# Patient Record
Sex: Male | Born: 1940
Health system: Southern US, Community
[De-identification: ages and names within clinical notes are randomized; demographics above are authoritative.]

## PROBLEM LIST (undated history)

## (undated) DIAGNOSIS — I471 Supraventricular tachycardia, unspecified: Secondary | ICD-10-CM

## (undated) DIAGNOSIS — N183 Chronic kidney disease, stage 3 unspecified: Secondary | ICD-10-CM

## (undated) DIAGNOSIS — F419 Anxiety disorder, unspecified: Secondary | ICD-10-CM

## (undated) DIAGNOSIS — I493 Ventricular premature depolarization: Secondary | ICD-10-CM

## (undated) DIAGNOSIS — K429 Umbilical hernia without obstruction or gangrene: Secondary | ICD-10-CM

## (undated) DIAGNOSIS — I259 Chronic ischemic heart disease, unspecified: Secondary | ICD-10-CM

## (undated) DIAGNOSIS — E669 Obesity, unspecified: Secondary | ICD-10-CM

## (undated) DIAGNOSIS — I2699 Other pulmonary embolism without acute cor pulmonale: Secondary | ICD-10-CM

## (undated) DIAGNOSIS — I10 Essential (primary) hypertension: Secondary | ICD-10-CM

## (undated) DIAGNOSIS — I456 Pre-excitation syndrome: Secondary | ICD-10-CM

## (undated) DIAGNOSIS — E785 Hyperlipidemia, unspecified: Secondary | ICD-10-CM

## (undated) DIAGNOSIS — I5032 Chronic diastolic (congestive) heart failure: Secondary | ICD-10-CM

## (undated) DIAGNOSIS — J449 Chronic obstructive pulmonary disease, unspecified: Secondary | ICD-10-CM

## (undated) DIAGNOSIS — G4733 Obstructive sleep apnea (adult) (pediatric): Secondary | ICD-10-CM

## (undated) DIAGNOSIS — R002 Palpitations: Secondary | ICD-10-CM

## (undated) DIAGNOSIS — J189 Pneumonia, unspecified organism: Secondary | ICD-10-CM

## (undated) HISTORY — DX: Palpitations: R00.2

## (undated) HISTORY — DX: Chronic ischemic heart disease, unspecified: I25.9

## (undated) HISTORY — DX: Obstructive sleep apnea (adult) (pediatric): G47.33

## (undated) HISTORY — DX: Chronic obstructive pulmonary disease, unspecified: J44.9

## (undated) HISTORY — DX: Umbilical hernia without obstruction or gangrene: K42.9

## (undated) HISTORY — DX: Pre-excitation syndrome: I45.6

## (undated) HISTORY — DX: Hyperlipidemia, unspecified: E78.5

## (undated) HISTORY — PX: HERNIA REPAIR: SHX51

## (undated) HISTORY — DX: Ventricular premature depolarization: I49.3

## (undated) HISTORY — DX: Supraventricular tachycardia, unspecified: I47.10

## (undated) HISTORY — DX: Obesity, unspecified: E66.9

## (undated) HISTORY — DX: Anxiety disorder, unspecified: F41.9

## (undated) HISTORY — DX: Supraventricular tachycardia: I47.1

## (undated) HISTORY — DX: Other pulmonary embolism without acute cor pulmonale: I26.99

## (undated) HISTORY — DX: Essential (primary) hypertension: I10

---

## 2004-12-16 ENCOUNTER — Ambulatory Visit: Payer: Self-pay | Admitting: Internal Medicine

## 2006-01-07 ENCOUNTER — Ambulatory Visit: Payer: Self-pay | Admitting: Internal Medicine

## 2006-04-16 ENCOUNTER — Encounter (INDEPENDENT_AMBULATORY_CARE_PROVIDER_SITE_OTHER): Payer: Self-pay | Admitting: Specialist

## 2006-04-16 ENCOUNTER — Ambulatory Visit (HOSPITAL_COMMUNITY): Admission: RE | Admit: 2006-04-16 | Discharge: 2006-04-17 | Payer: Self-pay | Admitting: Obstetrics and Gynecology

## 2006-09-01 ENCOUNTER — Ambulatory Visit: Payer: Self-pay | Admitting: Internal Medicine

## 2006-12-15 ENCOUNTER — Ambulatory Visit: Payer: Self-pay | Admitting: Internal Medicine

## 2007-05-24 ENCOUNTER — Ambulatory Visit: Payer: Self-pay | Admitting: Internal Medicine

## 2007-08-05 ENCOUNTER — Ambulatory Visit: Payer: Self-pay | Admitting: Internal Medicine

## 2007-08-15 DIAGNOSIS — F411 Generalized anxiety disorder: Secondary | ICD-10-CM | POA: Insufficient documentation

## 2007-08-15 DIAGNOSIS — I4949 Other premature depolarization: Secondary | ICD-10-CM

## 2007-08-15 DIAGNOSIS — G4733 Obstructive sleep apnea (adult) (pediatric): Secondary | ICD-10-CM | POA: Insufficient documentation

## 2007-08-15 DIAGNOSIS — J449 Chronic obstructive pulmonary disease, unspecified: Secondary | ICD-10-CM

## 2007-08-15 DIAGNOSIS — I10 Essential (primary) hypertension: Secondary | ICD-10-CM

## 2007-08-15 DIAGNOSIS — J4489 Other specified chronic obstructive pulmonary disease: Secondary | ICD-10-CM | POA: Insufficient documentation

## 2007-08-15 DIAGNOSIS — I259 Chronic ischemic heart disease, unspecified: Secondary | ICD-10-CM

## 2007-08-15 DIAGNOSIS — E66811 Obesity, class 1: Secondary | ICD-10-CM | POA: Insufficient documentation

## 2007-08-15 DIAGNOSIS — I456 Pre-excitation syndrome: Secondary | ICD-10-CM | POA: Insufficient documentation

## 2007-08-16 ENCOUNTER — Ambulatory Visit: Payer: Self-pay | Admitting: Internal Medicine

## 2007-08-16 DIAGNOSIS — E785 Hyperlipidemia, unspecified: Secondary | ICD-10-CM

## 2007-08-16 DIAGNOSIS — G4730 Sleep apnea, unspecified: Secondary | ICD-10-CM

## 2007-08-18 ENCOUNTER — Encounter: Payer: Self-pay | Admitting: Internal Medicine

## 2007-09-15 ENCOUNTER — Ambulatory Visit: Payer: Self-pay | Admitting: Internal Medicine

## 2007-09-15 DIAGNOSIS — J8410 Pulmonary fibrosis, unspecified: Secondary | ICD-10-CM

## 2007-09-15 DIAGNOSIS — I5032 Chronic diastolic (congestive) heart failure: Secondary | ICD-10-CM

## 2008-07-31 ENCOUNTER — Telehealth (INDEPENDENT_AMBULATORY_CARE_PROVIDER_SITE_OTHER): Payer: Self-pay | Admitting: *Deleted

## 2008-08-06 ENCOUNTER — Telehealth (INDEPENDENT_AMBULATORY_CARE_PROVIDER_SITE_OTHER): Payer: Self-pay | Admitting: *Deleted

## 2008-08-21 ENCOUNTER — Ambulatory Visit: Payer: Self-pay | Admitting: Internal Medicine

## 2008-08-21 DIAGNOSIS — F3289 Other specified depressive episodes: Secondary | ICD-10-CM

## 2008-08-30 ENCOUNTER — Telehealth: Payer: Self-pay | Admitting: Internal Medicine

## 2008-09-14 ENCOUNTER — Ambulatory Visit: Payer: Self-pay | Admitting: Psychology

## 2009-02-25 ENCOUNTER — Ambulatory Visit: Payer: Self-pay | Admitting: Internal Medicine

## 2009-06-05 ENCOUNTER — Telehealth: Payer: Self-pay | Admitting: Internal Medicine

## 2009-08-23 ENCOUNTER — Ambulatory Visit: Payer: Self-pay | Admitting: Internal Medicine

## 2010-02-11 ENCOUNTER — Ambulatory Visit: Payer: Self-pay | Admitting: Cardiovascular Disease

## 2010-02-11 ENCOUNTER — Encounter: Payer: Self-pay | Admitting: Physician Assistant

## 2010-02-11 DIAGNOSIS — R002 Palpitations: Secondary | ICD-10-CM

## 2010-02-11 LAB — CONVERTED CEMR LAB
BUN: 24 mg/dL — ABNORMAL HIGH (ref 6–23)
CO2: 26 meq/L (ref 19–32)
Chloride: 108 meq/L (ref 96–112)
Creatinine, Ser: 0.9 mg/dL (ref 0.4–1.5)
Sodium: 141 meq/L (ref 135–145)

## 2010-03-27 ENCOUNTER — Ambulatory Visit: Payer: Self-pay | Admitting: Internal Medicine

## 2010-05-14 NOTE — Assessment & Plan Note (Signed)
Summary: rov/mj   Visit Type:  Follow-up Referring Provider:  Dr. Sherryl Manges Primary Provider:  Dr. Lucila Maine in [facility]   History of Present Illness: Chad Hogan is a 70 year old male with a history of WPW followed by Dr. Graciela Husbands.  He is on digoxin and verapamil.  Dr. Graciela Husbands has tried to discontinue these medications in the past.  However, the patient developed recurrent tachy- palpitations and had to restart.  He comes in today with complaints of palpitations.  He describes these as a missed heartbeat.  It lasts just a second.  It comes and goes.  He denies any associated symptoms.  He denies any syncope or near syncope.  He has chronic dyspnea with exertion from his COPD.  There has been no change.  He denies chest pain.  He denies orthopnea, PND or edema.  Current Medications (verified): 1)  Digoxin 0.25 Mg  Tabs (Digoxin) .Marland Kitchen.. 1 By Mouth Daily 2)  Lopressor 50 Mg  Tabs (Metoprolol Tartrate) .... Two Times A Day 3)  Crestor 10 Mg  Tabs (Rosuvastatin Calcium) .... Once Daily 4)  Diovan 160 Mg Tabs (Valsartan) .... Take One Tablet By Mouth Daily 5)  Niaspan 750 Mg  Tbcr (Niacin (Antihyperlipidemic)) .... Once Daily 6)  Furosemide 40 Mg  Tabs (Furosemide) .... Once Daily 7)  Spiriva Handihaler 18 Mcg  Caps (Tiotropium Bromide Monohydrate) .... Once Daily 8)  Bayer Aspirin 325 Mg  Tabs (Aspirin) .... Once Daily 9)  Xanax 0.5 Mg  Tabs (Alprazolam) .... Once Daily As Needed 10)  Vitamin C .... Once Daily 11)  Fish Oil 1200 Mg  Caps (Omega-3 Fatty Acids) .... Once Daily 12)  Centrum   Tabs (Multiple Vitamins-Minerals) .... Once Daily 13)  Advair Diskus 250-50 Mcg/dose  Misc (Fluticasone-Salmeterol) .... One Puff Twice Daily 14)  Verapamil Hcl Cr 240 Mg Cr-Tabs (Verapamil Hcl) .... Take One Tablet Two Times A Day  Allergies: 1)  ! Codeine  Past History:  Past Medical History: Reviewed history from 09/15/2007 and no changes required. C O P D (ICD-496) - Gold Stage 2-3 on PFT  09/15/2007 with DLCO 17.3/63%. Desaturated on exertion 09/15/2007 SHORTNESS OF BREATH (SOB) (ICD-786.05) - due to Obesity, COPD and Deconditioning SLEEP APNEA (ICD-780.57) HYPERLIPIDEMIA (ICD-272.4) WOLFF (WOLFE)-PARKINSON-WHITE (WPW) SYNDROME (ICD-426.7) SLEEP APNEA, OBSTRUCTIVE (ICD-327.23) ISCHEMIC HEART DISEASE (ICD-414.9) SUPRAVENTRICULAR TACHYCARDIA (ICD-427.89) PREMATURE VENTRICULAR CONTRACTIONS (ICD-427.69) ANXIETY (ICD-300.00) OBESITY (ICD-278.00) HYPERTENSION (ICD-401.9)   1. Symptomatic palpitations including:       a.     Supraventricular tachycardia related to a concealed        accessory pathway relatively quiescent.       b.     PVCs (premature ventricular contractions).   2. Ischemic heart disease with:       a.     Prior MI (myocardial infarction).       b.     Nonischemic Myoview with:       c.     Near normal ejection fraction in 2006.  3. Dyslipidemia.  4. Obesity.  5. Obstructive sleep apnea. - improved sleep quality since CPAP in 2008  6. Umbilical hernia. 7. COPD - diagnosed in 2005. Managed by Dr. Arliss Journey. Symbicort made him develop palpitations in 2008. On spriva/advair without problems. Severity unknown. Could not afford rehab at [facility] 8. Obesity  Past Surgical History: Reviewed history from 08/16/2007 and no changes required. hernia surgery  Review of Systems       No recent illnesses, fever, diarrhea, vomiting.  Vital  Signs:  Patient profile:   70 year old male Height:      69 inches Weight:      251 pounds BMI:     37.20 Pulse rate:   56 / minute BP sitting:   130 / 70  (left arm)  Vitals Entered By: Laurance Flatten CMA (February 11, 2010 12:19 PM)  Physical Exam  General:  Well nourished, well developed, in no acute distress HEENT: normal Neck: no JVD Cardiac:  normal S1, S2; RRR; no murmur Lungs:  clear to auscultation bilaterally, no wheezing, rhonchi or rales Abd: soft, nontender, no hepatomegaly Ext: no clubbing, cyanosis or  edema Vascular: no carotid  bruits Skin: warm and dry    EKG  Procedure date:  02/11/2010  Findings:      Sinus bradycardia with rate of:  56 minimal delta wave noted prob dig effect no change from prior tracing   Impression & Recommendations:  Problem # 1:  PALPITATIONS (ICD-785.1) He is describing PVCs EKG unchanged from prior today. Check elytes, TSH Reassurance F/u in 6 weeks. If continues or worsens, consider monitor  Problem # 2:  WOLFF (WOLFE)-PARKINSON-WHITE (WPW) SYNDROME (ICD-426.7)  Attempts have been made to get him off digoxin and diltiazem in the past without success.  Other Orders: TLB-BMP (Basic Metabolic Panel-BMET) (80048-METABOL) TLB-Magnesium (Mg) (83735-MG) TLB-TSH (Thyroid Stimulating Hormone) ([address]-UEA)  Patient Instructions: 1)  Your physician recommends that you schedule a follow-up appointment in: 6 weeks with Dr. Graciela Husbands. 2)  Your physician recommends that you return for lab work [id number]. BMET, mg+ TSH.

## 2010-05-14 NOTE — Progress Notes (Signed)
Summary: REFILL  Phone Note Refill Request Message from:  Patient on June 05, 2009 11:17 AM  VERAPAMIL 240MG  SEND TO [address]  [phone] PT IS OUT OF MEDICATION NEEDS A 14 DAY SUPPLY TO HE GET MAIL ORDER.  Initial call taken by: Judie Grieve,  June 05, 2009 11:21 AM  Follow-up for Phone Call       Follow-up by: Judithe Modest CMA,  June 05, 2009 11:23 AM    New/Updated Medications: VERAPAMIL HCL CR 240 MG CR-TABS (VERAPAMIL HCL) take one tablet two times a day Prescriptions: DIGOXIN 0.25 MG  TABS (DIGOXIN) 1 by mouth daily  #90 x 3   Entered by:   Judithe Modest CMA   Authorized by:   Nathen May, MD, [facility]   Signed by:   Judithe Modest CMA on 06/05/2009   Method used:   Print then Give to Patient   RxID:   [id number] VERAPAMIL HCL CR 240 MG CR-TABS (VERAPAMIL HCL) take one tablet two times a day  #180 x 0   Entered by:   Judithe Modest CMA   Authorized by:   Nathen May, MD, [facility]   Signed by:   Judithe Modest CMA on 06/05/2009   Method used:   Electronically to        [facility]* (mail-order)             ,          Ph: [phone]       Fax: [phone]   RxID:   [id number] VERAPAMIL HCL CR 240 MG CR-TABS (VERAPAMIL HCL) take one tablet two times a day  #28 x 0   Entered by:   Judithe Modest CMA   Authorized by:   Nathen May, MD, [facility]   Signed by:   Judithe Modest CMA on 06/05/2009   Method used:   Electronically to        [facility]  [address]* (retail)       [address]       [place], [place]  [address]       Ph: [phone]       Fax: [phone]   RxID:   [id number]  Pt asked for 14 day supply of verapamil 240.  Sent to [facility]  Also sent in Rx for the verapamil to mail order.  Pt asked that I mail him the Rx for the Digoxin.  He states that there is always a problem with the [employer].

## 2010-08-12 ENCOUNTER — Encounter: Payer: Self-pay | Admitting: Internal Medicine

## 2010-08-13 ENCOUNTER — Ambulatory Visit (INDEPENDENT_AMBULATORY_CARE_PROVIDER_SITE_OTHER): Payer: Medicare Other | Admitting: Internal Medicine

## 2010-08-13 ENCOUNTER — Encounter: Payer: Self-pay | Admitting: Internal Medicine

## 2010-08-13 DIAGNOSIS — I259 Chronic ischemic heart disease, unspecified: Secondary | ICD-10-CM

## 2010-08-13 DIAGNOSIS — I4949 Other premature depolarization: Secondary | ICD-10-CM

## 2010-08-13 DIAGNOSIS — E785 Hyperlipidemia, unspecified: Secondary | ICD-10-CM

## 2010-08-13 DIAGNOSIS — I456 Pre-excitation syndrome: Secondary | ICD-10-CM

## 2010-08-13 DIAGNOSIS — F3289 Other specified depressive episodes: Secondary | ICD-10-CM

## 2010-08-13 MED ORDER — DIGOXIN 250 MCG PO TABS: 250.0000 ug | ORAL_TABLET | Freq: Every day | ORAL | Status: DC

## 2010-08-13 MED ORDER — VERAPAMIL HCL 240 MG PO TBCR: 240.0000 mg | EXTENDED_RELEASE_TABLET | Freq: Two times a day (BID) | ORAL | Status: DC

## 2010-08-13 NOTE — Assessment & Plan Note (Signed)
He has had no tachypalpitations.   I am not entirely convinced that there is no preexcitation given the slurring of the QRS, but the pR interval is very long;  wil l ocntniue current meds

## 2010-08-13 NOTE — Assessment & Plan Note (Signed)
Currently asymptomatic continue current meds

## 2010-08-13 NOTE — Patient Instructions (Signed)
Your physician recommends that you schedule a follow-up appointment in: 12 months.  

## 2010-08-13 NOTE — Assessment & Plan Note (Signed)
HDL 37 and TG 158 LDL 58 continue current meds

## 2010-08-13 NOTE — Assessment & Plan Note (Signed)
quiet

## 2010-08-13 NOTE — Progress Notes (Signed)
HPI  Chad Hogan is a 70 y.o. male seen in followup for SVT occurring in the context of what is described as WPW  I seen him over the years with my notes describing progressive loss of antegrade conduction and tense the introduction of verapamil and most recently digoxin for control of his palpitations.  He was seen a few months ago by [employer] and felt to have PVCs  They have been quiexcient He brings up to other issues today. The first is a tremor the other is depression. As relates to the latter he has seen [facility] but no meds were prescribed.  Previously he was on prozac which he did not tolerate  Past Medical History  Diagnosis Date  . COPD (chronic obstructive pulmonary disease)   . SOB (shortness of breath)   . OSA (obstructive sleep apnea)   . HLD (hyperlipidemia)   . WPW (Wolff-Parkinson-White syndrome)   . Ischemic heart disease   . SVT (supraventricular tachycardia)   . PVC (premature ventricular contraction)   . Anxiety   . Obesity   . HTN (hypertension)   . Palpitation   . Umbilical hernia     Past Surgical History  Procedure Date  . Hernia repair     Current Outpatient Prescriptions  Medication Sig Dispense Refill  . ALPRAZolam (XANAX) 0.5 MG tablet Take 0.5 mg by mouth at bedtime as needed.        . Ascorbic Acid (VITAMIN C) 500 MG tablet Take 500 mg by mouth daily.        Marland Kitchen aspirin 325 MG tablet Take 325 mg by mouth daily.        . digoxin (LANOXIN) 0.25 MG tablet Take 250 mcg by mouth daily.        . Fluticasone-Salmeterol (ADVAIR DISKUS) 250-50 MCG/DOSE AEPB Inhale 1 puff into the lungs every 12 (twelve) hours.        . furosemide (LASIX) 40 MG tablet Take 40 mg by mouth daily.        . metoprolol (LOPRESSOR) 50 MG tablet Take 50 mg by mouth 2 (two) times daily.        . Multiple Vitamin (MULTIVITAMIN) tablet Take 1 tablet by mouth daily.        . niacin (NIASPAN) 1000 MG CR tablet Take 1,000 mg by mouth at bedtime.        . Omega-3 Fatty  Acids (FISH OIL) 1200 MG CAPS Take by mouth daily.        . rosuvastatin (CRESTOR) 10 MG tablet Take 10 mg by mouth daily.        Marland Kitchen tiotropium (SPIRIVA) 18 MCG inhalation capsule Place 18 mcg into inhaler and inhale daily.        . valsartan (DIOVAN) 160 MG tablet Take 160 mg by mouth daily.        . verapamil (CALAN-SR) 240 MG CR tablet Take 240 mg by mouth 2 (two) times daily.        Marland Kitchen DISCONTD: niacin (NIASPAN) 750 MG CR tablet Take 750 mg by mouth at bedtime.          Allergies  Allergen Reactions  . Codeine     Review of Systems negative except from HPI and PMH  Physical Exam Well developed and obese ; tr in no acute distress HENT normal E scleral and icterus clear Neck Supple JVP flat; carotids brisk and full Clear to ausculation Regular rate and rhythm, no murmurs gallops or rub Soft with  active bowel sounds No clubbing cyanosis and edema Alert and oriented, grossly normal motor and sensory function; tremor reight hand Skin Warm and Dry  ECG sinus 56 .22/10/42 Slurring is evidnet in the lateral precordium  Assessment and  Plan

## 2010-08-13 NOTE — Assessment & Plan Note (Signed)
Have encouraged him to see his PCPabout this and consider pharmacological therapy

## 2010-08-26 NOTE — Assessment & Plan Note (Signed)
[facility]                         ELECTROPHYSIOLOGY OFFICE NOTE   Chad Hogan, Chad Hogan                         MRN:          [id number]  DATE:08/05/2007                            DOB:          Nov 21, 1940    Chad Hogan is seen in followup for palpitations.  He has supraventricular  tachycardia with previously evidenced Wolff-Parkinson-White now with  loss of antegrade conduction.  He also has symptomatic PVCs as well as  bad lungs.  He is quite frustrated with his pulmonologist and he is  quite impaired from a respiratory point of view.   He continues to struggle with his weight.  It is at 273 pounds which is  up 10 pounds in the last year but stable the last couple of months.  His  blood pressure is 110/60.  His lungs were clear.  His heart sounds were  regular.  The extremities were without edema.   MEDICATIONS:  1. His medication list I should note includes Lopressor 50 b.i.d.  2. Diovan 160.  3. Crestor 10.  4. Niaspan 750.  5. Furosemide 40.  6. Advair.  7. Verapamil 240 b.i.d.  8. Lanoxin 0.25.   Electrocardiogram today demonstrated sinus rhythm without evidence of  pre-excitation.   IMPRESSION:  1. Symptomatic palpitations including:      a.     Supraventricular tachycardia related to a concealed       accessory pathway relatively quiescent.      b.     PVCs (premature ventricular contractions).  2. Ischemic heart disease with:      a.     Prior MI (myocardial infarction).      b.     Nonischemic Myoview with:      c.     Near normal ejection fraction in 2006.  3. Obesity.  4. Anxiety.  5. COPD (chronic obstructive pulmonary disease).   Chad Hogan cardiac status is relatively stable.  I have told him that  there are not a lot of other good options for his PVCs unless we pursue  antiarrhythmic drug therapy with its potential for a life-threatening  proarrhythmia.  An alternative would be nonmedicinal therapy with  biofeedback.   He also asked if he could get some help with a pulmonary consultation so  we set him up with our pulmonary division for his COPD and I have  impressed upon him the potential value of weight loss to his overall  sense of well-being.   We will plan to see him again I think it was in 6 months.     [name], MD, [facility]  Electronically Signed    SCK/MedQ  DD: 08/05/2007  DT: 08/05/2007  Job #: [id number]   cc:   Lucila Maine, Dr

## 2010-08-26 NOTE — Assessment & Plan Note (Signed)
[facility]                         ELECTROPHYSIOLOGY OFFICE NOTE   ADMIR, CANDELAS                         MRN:          [id number]  DATE:05/24/2007                            DOB:          18-Mar-1941    Mr. Basnett comes in today continuing to complaint of racing heart and  skips.  He has both palpitations and slow SVT in the context of previous  WPW.   He remains quite reluctant to undertake a curative procedure.  We  discussed other treatment options including alternative calcium  blockers, addition of Lanoxin now that his preexcitation is gone,  antiarrhythmic drugs with potential for proarrhythmia realizing that he  is not a candidate for 1c and catheter ablation.  At this point, he  would like to proceed with the first option.   PHYSICAL EXAMINATION:  VITAL SIGNS:  On examination I should note that  his blood pressure today was 128/60, his pulse was 60 and his weight was  275 which is up 5 pounds since September and up 10 pounds since May and  up 15 pounds in the last year and a half and almost 25 pounds in the  last 2-1/2 years.  LUNGS:  His lungs were clear.  HEART:  His heart sounds were regular.  EXTREMITIES:  Without edema.   Electrocardiogram demonstrated sinus rhythm without evidence of  ventricular pre-excitation.   IMPRESSION:  1. Symptomatic palpitations including:      a.     Supraventricular tachycardia.      b.     Premature ventricular contractions.      c.     Ischemic heart disease with prior myocardial infarction and       a nonischemic Myoview in 2006.  2. Obesity, progressive.  3. Anxiety.   Mr. Muto has continued symptoms and as we outlined above we will plan at  this point to begin him on Cardizem 240 b.i.d. to replace the verapamil.  If he continues to have symptoms after a month he will add Lanoxin.  I  will see him in 2 months' time.     [name], MD, [facility]  Electronically Signed    SCK/MedQ  DD:  05/24/2007  DT: 05/25/2007  Job #: [id number]   cc:   Lucila Maine, Dr

## 2010-08-26 NOTE — Letter (Signed)
December 15, 2006    Dr. Lucila Maine  [address]  [place], [place] [address]   RE:  Chad Hogan, Chad Hogan  MRN:  [id number]  /  DOB:  06/25/1940   Dear Nadine Counts,   Chad Hogan comes in. Thank you very much for your help in having gotten  the tracings. They were very interesting and they are summarized below.   He has had no recurrent episodes of tachycardia since the monitor came  off at the end of June.   CURRENT MEDICATIONS:  1. Lopressor 50 b.i.d.  2. Verapamil 180 b.i.d.  3. Diovan.  4. Crestor.  5. Niaspan.  6. Furosemide.   He is also on sleep therapy for his obstructive sleep apnea.   PHYSICAL EXAMINATION:  VITAL SIGNS:  His weight unfortunately is up  another 5 pounds at 270 pounds, his blood pressure is 118/76.  LUNGS:  Clear.  HEART:  Heart sounds were regular.  EXTREMITIES:  Without edema.   Review of the strips demonstrates that he has a relatively slow SVT at a  heart rate of about 110 beats per minute. Interestingly it is both  initiated and terminated by PVCs which in the context of his history of  WPW makes it almost certain that this represents a very slowly  conducting concealed accessory pathway mediating what we call  orthodromic supraventricular tachycardia.   As it relates to what to do, we discussed treatment options and one  would be increasing his verapamil as this has been the more effective of  this therapies, antiarrhythmic therapy with his potential for  proarrhythmia and EP study with RF catheter ablation. We discussed the  side effects of the drugs as well as the potential complications of the  procedure and at this point, especially given the quiescence over recent  months, he would like to stay steady with his current therapy. The plan  would be for him to increase his verapamil to 240 mg twice daily and I  have given him a prescription for this if he has had frequent  recurrence.   I set him up to come back and see me in 6 months' time and asked  him to  call me if he has any problems in the interim.   Nadine Counts, if I can help with him or anybody else please do not hesitate to  let me know.    Sincerely,      [name], MD, [facility]  Electronically Signed    SCK/MedQ  DD: 12/15/2006  DT: 12/15/2006  Job #: [id number]

## 2010-08-26 NOTE — Letter (Signed)
February 25, 2009    Dr. Lucila Maine  [address]  [place], [place]  [address]   RE:  DREYDEN, ROHRMAN  MRN:  [id number]  /  DOB:  21-Sep-1940   Dear Nadine Counts,   Mr. Iyengar comes in for followup today for tachycardia in the setting of  previously identified WPW.  We had tried to stop his verapamil last  year.  Unfortunately, he had recurrent symptoms.  He went back on his  verapamil and has done well.  He continues to take both verapamil and  digoxin, although we have had lengthy discussions about the fact that  they are not appropriate for patients with WPW.  He does not have  evidence of antegrade conduction, and hence we have continued them  pretty much at his insistence.   His other concern that he comes in today, whether he in fact has  diabetes.  He brings him the lab work that you brought with a fasting  blood sugar of 126, and a 2-hour postprandial of 129 or so.  He and I  took the opportunity to look up the official diagnoses of diabetes to  clarify the fasting blood sugar of 126 was diagnostic for diabetes.   His medications currently include:  1. Digoxin 0.25.  2. Lopressor 50 b.i.d.  3. Verapamil 240 b.i.d.  4. Crestor 10.  5. Diovan.  6. Niaspan.  7. Furosemide.  8. Spiriva.   PHYSICAL EXAMINATION:  VITAL SIGNS:  His weight was 275, blood pressure  110/67, pulse of 48.  This represents a stable weight.  NECK:  Veins were flat.  LUNGS:  Clear.  HEART:  Sounds were regular.  ABDOMEN:  Protuberant, but soft.  EXTREMITIES:  Without edema.  SKIN:  Warm and dry.   Electrocardiogram was not obtained.   IMPRESSION:  1. Supraventricular tachycardia.  2. Diabetes.  3. History of ischemic heart disease.   Mr. Obeirne tachycardia issues are relatively stable on his current  medications.   The other issue was diabetes.  We spent a long time, certainly more than  half of our 30-minute visit, talking about the importance of exercise  and diet in a diabetic condition, and  the poor long-term implications of  weight loss and exercise in terms of outcomes.   Hopefully, this will translate for him into benefit and that he will be  able to lose some weight.  He is trying.  Already, he has put himself on  the Atkins diet and hopefully, you will see the results of this when you  see him next month.   I hope this finds you well.  Merry Christmas and Happy New Year and  Happy Thanksgiving.    Sincerely,      [name], MD, [facility]  Electronically Signed    SCK/MedQ  DD: 02/25/2009  DT: 02/26/2009  Job #: [id number]

## 2010-08-26 NOTE — Letter (Signed)
Aug 23, 2009    Lucila Maine, M.D.  [address]  [place], [place] [address]   RE:  Chad, Hogan  MRN:  [id number]  /  DOB:  November 07, 1940   Dear Nadine Counts:   [name] comes in today in followup for Wolff-Parkinson-White.  He has  had no intercurrent palpitations.  He continues to take his verapamil  and digoxin despite our concerns about them being contraindicated in  WPW.  There has been intermittent loss of ventricular pre-excitation and  so his risks are probably relatively low.   He has lost 20 pounds since we saw him last.  We reviewed his blood work  today.  His hemoglobin A1c is normal.  His fasting blood sugar was 105.  We also looked at his lipids.   His blood pressure was also improved, today it was 139/78.  His pulse  was 56.  His weight was 254.  LUNGS:  Clear.  HEART:  Sounds were regular.  ABDOMEN:  Protuberant but soft.  EXTREMITIES:  Without edema.  He was alert and oriented.  SKIN:  Was warm and dry.   Electrocardiogram dated this morning demonstrated sinus rhythm at 56  with intervals of 0.20/0.12/0.43.  There remains an evidence of a delta  wave with a long PR interval.   IMPRESSION:  Wolff-Parkinson-White, stable.  Will continue on his  current medications at his request and insistence.  Hypertension, improved on current medications.  Obesity.  I have again counseled him to work on ongoing weight loss.  Hopefully this will translate into further improvement in his sugar  levels.   We will see him again in 12 months' time.    Sincerely,      [name], MD, [facility]  Electronically Signed    SCK/MedQ  DD: 08/23/2009  DT: 08/23/2009  Job #: [id number]

## 2010-08-26 NOTE — Assessment & Plan Note (Signed)
[facility]                         ELECTROPHYSIOLOGY OFFICE NOTE   Chad Hogan, Chad Hogan                         MRN:          [id number]  DATE:09/01/2006                            DOB:          04-11-1941    Chad Hogan comes in, having developed palpitations recently.  This  occurred in the context of having seen Dr. Brendolyn Patty in the wake of a  sleep study, which we had ordered, which turned out to be abnormal.  C-  PAP and oxygen have markedly facilitated his sleeping and he now wakes  refreshed.  In the wake of that evaluation, he also had his Advair  changed, initially to Symbicort, which is a combination steroid/agonist,  and then subsequently to Asmanex, which is a steroid.  Unfortunately,  the patient continued to have palpitations with these and he has gone  back to Advair and has now had two quiet days.   He describes these spells at about 105 beats per minute.  They are  quite light, so that he does not think that he could feel them unless  he did not take his pulse, which he does because he is aware of a change  in his breathing.  There is also some moderate light-headedness with  this.   His medications include:  1. Lopressor 50 b.i.d.  2. Verapamil 180 b.i.d.  3. Diovan 160.  4. Crestor.  5. Furosemide.  6. Astelin.  7. Advair.   ON EXAMINATION:  His weight today was 265, his blood pressure was  126/78, his pulse was 66.  His lungs were clear.  Heart sounds were  regular.  The extremities were without edema.   IMPRESSION:  1. Wolff-Parkinson-White syndrome with loss of antegrade pre-      excitation.  2. New-onset palpitations, question mechanism.  3. Asthmatic chronic obstructive pulmonary disease.  4. Diastolic heart failure.  5. Obesity.  6. Obstructive sleep apnea, on therapy.   Chad Hogan is doing better from a sleep point of view.  However, he  continues to not thrive relative to his palpitations, weight and  exercise  intolerance.  He is going to work on the weight and exercise  and hopefully the exercise intolerance will improve.  As relates to his  rhythm issue, we will need to clarify what it is in the event that it  returns, the likelihood of which is very high.  I have suggested that he  go either to the emergency room, to the fire station, and/or Dr. Roby Lofts  office for recording and then we would be glad to see him again.  He is  otherwise scheduled to see [place] in September.     [name], MD, [facility]  Electronically Signed    SCK/MedQ  DD: 09/01/2006  DT: 09/01/2006  Job #: [id number]   cc:   Lucila Maine, Dr.  Dr. Orene Desanctis, [place], [place]

## 2010-08-29 NOTE — Letter (Signed)
January 07, 2006     Lucila Maine, M.D.  [address]  [place], [place]  [address]   RE:  Chad Hogan, Chad Hogan  MRN:  [id number]  /  DOB:  01/04/1941   Dear Nadine Counts,   I hope you had a good week away.   Mr. Lundahl came in today. He has had no recurrent SVT of any significance.   We had ordered a stress test that was done at [facility] last year.  He had been told that this was normal. As I reviewed the report read by  Dr. Sherril Croon, it demonstrated that there was an inferior scar without recurrence  of ischemia. His ejection fraction was normal. I told the patient that this  represents either an artifact or a bona fide MI.   His therapies currently include beta blockers, aspirin, and dyslipidemia  therapy.   The patient also has significant daytime somnolence.   He also recounts that he has had this problem with transient coughing and  shortness of breath. There was some question about whether it was related to  the Lopressor a year ago. His Lopressor dose remains unchanged.   Medications are noted for the intercurrent addition of furosemide, Advair  and Niaspan.   He remains on Crestor.   PHYSICAL EXAMINATION:  VITAL SIGNS:  His weight is 262 from 270 a year go.  His blood pressure is 142/81, pulse 68.  LUNGS:  Diffuse rhonchi.  HEART:  Clear.  EXTREMITIES:  There was no edema.   Electrocardiogram demonstrated sinus rhythm without evidence of  preexcitation.   IMPRESSION:  1. Wolff-Parkinson-White, now with loss of antegrade pre-excitation.  2. Cardiolite scan demonstrating an inferior perfusion defect consistent      with a myocardial infarction.  3. Dyslipidemia.  4. Obesity.  5. Daytime somnolence, likely related to obstructive sleep apnea.  6. Umbilical hernia.   Mr. Barbaro is going okay from a rhythm point of view. Nadine Counts, I wanted to talk  with you about getting him set up for a sleep study, and you are out of  town, so hopefully I will talk to you when  you get back.   I have encouraged him to continue to work on loosing weight.   I have given him the contact number for [facility], as he is  requesting a local surgical group that might be able to help him with repair  of his umbilical hernia.   Thanks very much for allowing me to participate in his care.    Sincerely,      [name], MD, [facility]    SCK/MedQ  DD:  01/07/2006  DT:  01/09/2006  Job #:  [id number]

## 2010-08-29 NOTE — Op Note (Signed)
NAMELYCAN, DAVEE NO.:  [id number]   MEDICAL RECORD NO.:  [id number]          PATIENT TYPE:  AMB   LOCATION:  DAY                          FACILITY:  [facility]   PHYSICIAN:  Sandria Bales. Ezzard Standing, M.D.  DATE OF BIRTH:  [birth year]   DATE OF PROCEDURE:  04/16/2006  DATE OF DISCHARGE:                               OPERATIVE REPORT   PREOPERATIVE DIAGNOSIS:  Incarcerated umbilical hernia.   POSTOPERATIVE DIAGNOSIS:  Incarcerated umbilical hernia (omentum in  hernia), with approximately 4 cm fascial defect.   PROCEDURE:  Open umbilical hernia repair with 8 cm Ventralex mesh.   SURGEON:  Sandria Bales. Ezzard Standing, M.D.   No first assistant.   ANESTHESIA:  General endotracheal, with approximately 20 cc of 0.25%  Marcaine.   COMPLICATIONS:  None.   INDICATIONS FOR THE PROCEDURE:  Mr. Hankin is a 70 year old white male who  has had an umbilical hernia for a number of years.  This has gotten  slowly larger.  He has now about a 5 x 7 cm mass consistent with a  hernia, probably with omentum.   He sees Dr. Lorin Picket Murkin at [facility] in [place].   The indications and potential complications of the procedure were  explained to the patient.  Potential complications include but are not  limited to bleeding, infection, bowel injury, and the possibility of  recurrent hernia.  He does have CPAP, but I think if we do him first  thing in the morning, which he is scheduled for, he may be able to go  home the same day, which he desperately wants to do.   OPERATIVE NOTE:  The patient was placed in the supine position and given  a general endotracheal anesthetic.  His abdomen was shaved, prepped with  Betadine solution, and sterilely draped.  He was given 1 g of Ancef at  the initiation of the procedure.   We went through an infraumbilical incision, though he really did not  have a well-defined umbilicus, and got down into a wad of what which was  omentum incarcerated in his  hernia.  It was about 7 cm in diameter.  I  reduced the omentum, which left about a 4 cm umbilical hernia defect.  I  resected the sac and sent it to pathology.  I tried to get into the  preperitoneal space, but that was somewhat hard to define, so I kind of  did a double-layer closure of the underneath layer, an 8 cm Bard  Ventralex mesh, which was tacked underneath the fascia using interrupted  Novofil sutures.  I used approximately 8 of these sutures in a  circumferential pattern, and then I closed the hernia defect primarily  on top with interrupted 0 Novofil sutures.   I then infiltrated the fascia and skin with approximately 20 cc of 0.25%  Marcaine.  I irrigated the wound and closed the subcutaneous tissues  with 3-0 Vicryl suture, closed the skin with a 5-0 Vicryl suture,  painted the wound with tincture of Benzoin, and Steri-Stripped it.  He  can put a  dressing on it and wear an abdominal binder.   The plan is to let him go home today if he does well.  Return to see me  in 2 or 3 weeks for followup.  The sponge and needle counts were correct  at the end of the case.      Sandria Bales. Ezzard Standing, M.D.  Electronically Signed     DHN/MEDQ  D:  04/16/2006  T:  04/16/2006  Job:  [id number]   cc:   Karle Barr, M.D.  [facility]  [place]  Fax [phone]   [name], MD, [facility]  [address]  [address]  [place]  [place] [address]

## 2010-09-02 ENCOUNTER — Telehealth: Payer: Self-pay | Admitting: Internal Medicine

## 2010-09-02 NOTE — Telephone Encounter (Signed)
Pt needs verapamil 240 mg 2 twice a day for 90 days supply with 3 refill. Pt would a written prescription to be mail to is home.

## 2010-09-03 ENCOUNTER — Telehealth: Payer: Self-pay | Admitting: Internal Medicine

## 2010-09-04 ENCOUNTER — Other Ambulatory Visit: Payer: Self-pay | Admitting: *Deleted

## 2010-09-04 MED ORDER — VERAPAMIL HCL 240 MG PO TBCR: 240.0000 mg | EXTENDED_RELEASE_TABLET | Freq: Two times a day (BID) | ORAL | Status: DC

## 2010-11-10 ENCOUNTER — Encounter: Payer: Self-pay | Admitting: Cardiovascular Disease

## 2011-03-12 ENCOUNTER — Telehealth: Payer: Self-pay | Admitting: Internal Medicine

## 2011-03-12 DIAGNOSIS — I456 Pre-excitation syndrome: Secondary | ICD-10-CM

## 2011-03-12 MED ORDER — VERAPAMIL HCL 240 MG PO TBCR: 240.0000 mg | EXTENDED_RELEASE_TABLET | Freq: Two times a day (BID) | ORAL | Status: DC

## 2011-03-12 MED ORDER — DIGOXIN 250 MCG PO TABS: 250.0000 ug | ORAL_TABLET | Freq: Every day | ORAL | Status: DC

## 2011-03-12 NOTE — Telephone Encounter (Signed)
New Msg: Pt calling wanting refill requests mailed to pt home.

## 2011-03-19 MED ORDER — DIGOXIN 250 MCG PO TABS: 250.0000 ug | ORAL_TABLET | Freq: Every day | ORAL | Status: DC

## 2011-03-19 MED ORDER — VERAPAMIL HCL 240 MG PO TBCR: 240.0000 mg | EXTENDED_RELEASE_TABLET | Freq: Two times a day (BID) | ORAL | Status: DC

## 2011-03-19 NOTE — Telephone Encounter (Signed)
F/up:  Pt has called back and wants his prescriptions "written and Mailed" to him.  He is changing policies and needs to send in with a new form.  Please mail to the patient ASAP so he can get this paperwork started.  He needs Digoxin and Veraparmil 90 day supply with 3 refills,  Call patient if any problems.

## 2011-03-19 NOTE — Telephone Encounter (Signed)
RX printed. Ready for MD signature. I will call him when they are in the mail.

## 2011-03-20 NOTE — Telephone Encounter (Signed)
The patient is aware his prescriptions are going in the mail.

## 2012-03-04 ENCOUNTER — Ambulatory Visit (INDEPENDENT_AMBULATORY_CARE_PROVIDER_SITE_OTHER): Payer: Medicare Other | Admitting: Internal Medicine

## 2012-03-04 ENCOUNTER — Encounter: Payer: Self-pay | Admitting: Internal Medicine

## 2012-03-04 VITALS — BP 144/64 | HR 62 | Ht 69.0 in | Wt 286.4 lb

## 2012-03-04 DIAGNOSIS — I5032 Chronic diastolic (congestive) heart failure: Secondary | ICD-10-CM

## 2012-03-04 DIAGNOSIS — E785 Hyperlipidemia, unspecified: Secondary | ICD-10-CM

## 2012-03-04 DIAGNOSIS — R002 Palpitations: Secondary | ICD-10-CM

## 2012-03-04 DIAGNOSIS — I456 Pre-excitation syndrome: Secondary | ICD-10-CM

## 2012-03-04 NOTE — Progress Notes (Signed)
HPI  Chad Hogan is a 71 y.o. male seen in followup for SVT occurring in the context of what is described as WPW  I have  seen him over the years with my notes describing progressive loss of antegrade conduction and hence the introduction of verapamil and most recently digoxin for control of his palpitations.  He continues to put on weight. He got a big screen TV.  He has daytime somnolence but does use CPAP and has noted that his car was read in the last 8 months.  He has not had chest pain or shortness of breath. He's had no recent palpitations  Past Medical History  Diagnosis Date  . COPD (chronic obstructive pulmonary disease)   . SOB (shortness of breath)   . OSA (obstructive sleep apnea)   . HLD (hyperlipidemia)   . WPW (Wolff-Parkinson-White syndrome)   . Ischemic heart disease   . SVT (supraventricular tachycardia)   . PVC (premature ventricular contraction)   . Anxiety   . Obesity   . HTN (hypertension)   . Palpitation   . Umbilical hernia     Past Surgical History  Procedure Date  . Hernia repair     Current Outpatient Prescriptions  Medication Sig Dispense Refill  . ALPRAZolam (XANAX) 0.5 MG tablet Take 0.5 mg by mouth at bedtime as needed.        . Ascorbic Acid (VITAMIN C) 500 MG tablet Take 500 mg by mouth daily.        Marland Kitchen aspirin 325 MG tablet Take 325 mg by mouth daily.        . digoxin (LANOXIN) 0.25 MG tablet Take 1 tablet (250 mcg total) by mouth daily.  90 tablet  3  . Fluticasone-Salmeterol (ADVAIR DISKUS) 250-50 MCG/DOSE AEPB Inhale 1 puff into the lungs every 12 (twelve) hours.        . furosemide (LASIX) 40 MG tablet Take 40 mg by mouth daily.        Marland Kitchen losartan (COZAAR) 100 MG tablet Take 100 mg by mouth daily.       . metoprolol (LOPRESSOR) 50 MG tablet Take 50 mg by mouth 2 (two) times daily.        . Multiple Vitamin (MULTIVITAMIN) tablet Take 1 tablet by mouth daily.        . niacin (NIASPAN) 1000 MG CR tablet Take 500 mg by mouth at bedtime.        . Omega-3 Fatty Acids (FISH OIL) 1200 MG CAPS Take by mouth daily.        . rosuvastatin (CRESTOR) 10 MG tablet Take 10 mg by mouth daily.        Marland Kitchen tiotropium (SPIRIVA) 18 MCG inhalation capsule Place 18 mcg into inhaler and inhale daily.        . verapamil (CALAN-SR) 240 MG CR tablet Take 1 tablet (240 mg total) by mouth 2 (two) times daily.  180 tablet  3    Allergies  Allergen Reactions  . Codeine     Review of Systems negative except from HPI and PMH  Physical Exam BP 144/64  Pulse 62  Ht 5\' 9"  (1.753 m)  Wt 286 lb 6.4 oz (129.91 kg)  BMI 42.29 kg/m2  SpO2 88% Well developed and morbidly obese  in no acute distress HENT normal E scleral and icterus clear Neck Supple JVP flat; carotids brisk and full Clear to ausculation Regular rate and rhythm, no murmurs gallops or rub Soft with active bowel sounds No  clubbing cyanosis and edema Alert and oriented, grossly normal motor and sensory function; tremor reight hand Skin Warm and Dry  ECG sinus 62 Intervals 20/10/40 slowing in the lateral precordium with a R wave in lead V1 and V2 most consistent with evidence of preexcitation  Assessment and  Plan

## 2012-03-04 NOTE — Assessment & Plan Note (Signed)
As above.

## 2012-03-04 NOTE — Patient Instructions (Addendum)
STOP Digoxin.  Your physician wants you to follow-up in: 6 months with Dr. Graciela Husbands.  You will receive a reminder letter in the mail two months in advance. If you don't receive a letter, please call our office to schedule the follow-up appointment.

## 2012-03-04 NOTE — Assessment & Plan Note (Signed)
There is evidence of antegrade conduction over an accessory pathway. Because of that we will stop his digoxin. I suspect that the conduction is poor given the intermittent evidence of the conduction; however, it may be quite far removed. Not withstanding, given his age and risks of atrial fibrillation we will stop his digoxin and anticipate stopping his verapamil at the next visit. He is concerned about recurrent palpitations; in the event that these occur, given his history of PVCs, will undertake an event monitor.

## 2012-03-04 NOTE — Assessment & Plan Note (Signed)
Will continue current meds  Stop dig

## 2012-03-04 NOTE — Assessment & Plan Note (Signed)
We discussed wieht loss and the importance of exercise  And have advised to get exercise instrument to place in front of his TV

## 2012-03-04 NOTE — Assessment & Plan Note (Signed)
Recent data has been raised the question of value of niacin will have to review this with his PCP this may also allow [place] to stop his aspirin reduced dose.

## 2012-04-13 ENCOUNTER — Encounter: Payer: Self-pay | Admitting: Physician Assistant

## 2012-04-28 ENCOUNTER — Ambulatory Visit (INDEPENDENT_AMBULATORY_CARE_PROVIDER_SITE_OTHER): Payer: Medicare Other | Admitting: Physician Assistant

## 2012-04-28 ENCOUNTER — Encounter: Payer: Self-pay | Admitting: Physician Assistant

## 2012-04-28 VITALS — BP 122/72 | HR 64 | Ht 69.0 in | Wt 265.8 lb

## 2012-04-28 DIAGNOSIS — I498 Other specified cardiac arrhythmias: Secondary | ICD-10-CM

## 2012-04-28 DIAGNOSIS — I471 Supraventricular tachycardia: Secondary | ICD-10-CM

## 2012-04-28 DIAGNOSIS — I10 Essential (primary) hypertension: Secondary | ICD-10-CM

## 2012-04-28 DIAGNOSIS — I456 Pre-excitation syndrome: Secondary | ICD-10-CM

## 2012-04-28 DIAGNOSIS — I5032 Chronic diastolic (congestive) heart failure: Secondary | ICD-10-CM

## 2012-04-28 NOTE — Patient Instructions (Addendum)
OUR OFFICE WILL SEND OUT A LETTER IN THE NEXT 2 MONTHS FOR YOU TO MAKE AN APPT WITH DR. Graciela Husbands  NO CHANGES WERE MADE TODAY

## 2012-04-28 NOTE — Progress Notes (Signed)
[address], [address] [place], [place]  [address] Phone: [phone], Fax:  [phone]  Date:  04/28/2012   Name:  Chad Hogan   DOB:  [birth year]   MRN:  [id number]  PCP:  Lucila Maine, MD  Primary Cardiologist:  Dr. Sherryl Manges  Primary Electrophysiologist:  Dr. Sherryl Manges    History of Present Illness: AADAN CHENIER is a 72 y.o. male who returns for followup after recent hospital admission at [facility] for community acquired pneumonia.  Patient has a hx of SVT occurring in the context of what is described as WPW. I have reviewed Dr. Odessa Fleming notes. He described a progressive loss of antegrade conduction over time. The patient had been placed on verapamil and digoxin for control of palpitations. Last seen by Dr. Graciela Husbands 11/13. He had evidence of antegrade conduction over an accessory pathway. Therefore, his digoxin was stopped. Other hx includes obesity, chronic diastolic CHF, HL, sleep apnea, HTN, COPD. Review of his records demonstrates a nuclear study in 9/06: Moderate sized inferior scar, no ischemia, EF 55%. It is not clear that he has had a heart catheterization in the past. I do not see a prior echo.   Patient was admitted to [facility] 12/26-1/1 with RLL pneumonia. He was noted to have irregular tachycardia described as rapid atrial fibrillation with aberrancy and trigeminy. He was asked to followup today. Patient has a copy of EKG and rhythm strips for review.  I have reviewed these with Dr. Ladona Ridgel who is in the office today.  Rhythm is likely SVT or AFib.  It is brief and non sustained.  Patient is doing better.  No chest pain.  Cough is resolved.  No palpitations.  Chronic dyspnea somewhat worse after bout of pneumonia.  No orthopnea, PND.  Edema improved.    Labs (1/14):   Hgb 14.5, K 4.7, creatinine 0.90, Mg 2.4, ALT 37 LDL 74, TSH 1.52  Wt Readings from Last 3 Encounters:  04/28/12 265 lb 12.8 oz (120.566 kg)  03/04/12 286 lb 6.4 oz (129.91 kg)   08/13/10 261 lb (118.389 kg)     Past Medical History  Diagnosis Date  . COPD (chronic obstructive pulmonary disease)   . SOB (shortness of breath)   . OSA (obstructive sleep apnea)   . HLD (hyperlipidemia)   . WPW (Wolff-Parkinson-White syndrome)   . Ischemic heart disease   . SVT (supraventricular tachycardia)   . PVC (premature ventricular contraction)   . Anxiety   . Obesity   . HTN (hypertension)   . Palpitation   . Umbilical hernia     Current Outpatient Prescriptions  Medication Sig Dispense Refill  . ALPRAZolam (XANAX) 0.5 MG tablet Take 0.5 mg by mouth at bedtime as needed.        . Ascorbic Acid (VITAMIN C) 500 MG tablet Take 500 mg by mouth daily.        Marland Kitchen aspirin 325 MG tablet Take 325 mg by mouth daily.        . clotrimazole (MYCELEX) 10 MG troche       . Fluticasone-Salmeterol (ADVAIR DISKUS) 250-50 MCG/DOSE AEPB Inhale 1 puff into the lungs every 12 (twelve) hours.        . furosemide (LASIX) 40 MG tablet Take 40 mg by mouth daily.        Marland Kitchen losartan (COZAAR) 100 MG tablet Take 100 mg by mouth daily.       . metoprolol (LOPRESSOR) 50 MG tablet Take 50  mg by mouth 2 (two) times daily.        . Multiple Vitamin (MULTIVITAMIN) tablet Take 1 tablet by mouth daily.        . niacin (NIASPAN) 1000 MG CR tablet Take 500 mg by mouth at bedtime.       . Omega-3 Fatty Acids (FISH OIL) 1200 MG CAPS Take by mouth daily.        . rosuvastatin (CRESTOR) 10 MG tablet Take 10 mg by mouth daily.        Marland Kitchen tiotropium (SPIRIVA) 18 MCG inhalation capsule Place 18 mcg into inhaler and inhale daily.        . verapamil (CALAN-SR) 240 MG CR tablet Take 1 tablet (240 mg total) by mouth 2 (two) times daily.  180 tablet  3    Allergies: Allergies  Allergen Reactions  . Codeine     Social History:  The patient  reports that he quit smoking about 16 years ago. He does not have any smokeless tobacco history on file.   ROS:  Please see the history of present illness.      All other  systems reviewed and negative.   PHYSICAL EXAM: VS:  BP 122/72  Pulse 64  Ht 5\' 9"  (1.753 m)  Wt 265 lb 12.8 oz (120.566 kg)  BMI 39.25 kg/m2  SpO2 92% Well nourished, well developed, in no acute distress HEENT: normal Neck: no JVD Cardiac:  normal S1, S2; RRR; no murmur Lungs:  clear to auscultation bilaterally, no wheezing, rhonchi or rales Abd: soft, nontender, no hepatomegaly Ext: no edema Skin: warm and dry Neuro:  CNs 2-12 intact, no focal abnormalities noted  EKG:  NSR, HR 64, left axis deviation, PR interval 194     ASSESSMENT AND PLAN:  1. Supraventricular Tachycardia:   Reviewed ECGs and strips with Dr. Lewayne Bunting who was in the office today.  Rhythm appears to be either SVT from WPW or possibly AFib.  Each episode was short lived and not sustained.  Arrhythmia occurred in the context of acute illness with pneumonia.  We have recommended no medication changes at this time.  CHADS2=2 (HTN, diast CHF).  However, as noted, this was in setting of pneumonia.  No need to consider anticoagulation at this time.  He will keep follow up with Dr. Sherryl Manges in 5/14 as planned. 2. Pneumonia:   Improved.  Follow up with PCP. 3. Hypertension:  Controlled.  Continue current therapy.  4. Diastolic CHF:   Volume stable.  Continue current Rx. 5. Disposition:  Records reviewed from [facility] and patient's chart - duration 15 minutes.  Follow up with Dr. Sherryl Manges in 08/2012.  Luna Glasgow, PA-C  11:28 AM 04/28/2012

## 2012-05-16 ENCOUNTER — Encounter: Payer: Self-pay | Admitting: Physician Assistant

## 2012-06-01 ENCOUNTER — Ambulatory Visit: Payer: Medicare Other | Admitting: Internal Medicine

## 2012-06-09 ENCOUNTER — Other Ambulatory Visit: Payer: Self-pay | Admitting: *Deleted

## 2012-06-09 MED ORDER — VERAPAMIL HCL ER 240 MG PO TBCR: 240.0000 mg | EXTENDED_RELEASE_TABLET | Freq: Two times a day (BID) | ORAL | Status: DC

## 2012-06-16 ENCOUNTER — Other Ambulatory Visit: Payer: Self-pay | Admitting: *Deleted

## 2012-06-16 MED ORDER — VERAPAMIL HCL ER 240 MG PO TBCR: 240.0000 mg | EXTENDED_RELEASE_TABLET | Freq: Two times a day (BID) | ORAL | Status: DC

## 2012-06-16 NOTE — Telephone Encounter (Signed)
Pt calling to get his verapamil sent to his local pharmacy until his mail order send his meds.  Fax Received. Refill Completed. Octavia Chowoe (R.M.A)

## 2012-06-20 ENCOUNTER — Other Ambulatory Visit: Payer: Self-pay | Admitting: Emergency Medicine

## 2012-06-20 MED ORDER — VERAPAMIL HCL ER 240 MG PO TBCR: 240.0000 mg | EXTENDED_RELEASE_TABLET | Freq: Two times a day (BID) | ORAL | Status: DC

## 2012-09-01 ENCOUNTER — Ambulatory Visit (INDEPENDENT_AMBULATORY_CARE_PROVIDER_SITE_OTHER): Payer: Medicare Other | Admitting: Internal Medicine

## 2012-09-01 ENCOUNTER — Encounter: Payer: Self-pay | Admitting: Internal Medicine

## 2012-09-01 VITALS — BP 140/76 | HR 74 | Ht 69.0 in | Wt 293.4 lb

## 2012-09-01 DIAGNOSIS — I456 Pre-excitation syndrome: Secondary | ICD-10-CM

## 2012-09-01 NOTE — Patient Instructions (Signed)
Your physician wants you to follow-up in: 1 year with Dr. Klein. You will receive a reminder letter in the mail two months in advance. If you don't receive a letter, please call our office to schedule the follow-up appointment.  Your physician recommends that you continue on your current medications as directed. Please refer to the Current Medication list given to you today.  

## 2012-09-01 NOTE — Progress Notes (Signed)
Chad Hogan  HPI  Chad Hogan is a 72 y.o. male seen in followup for SVT occurring in the context of what is described as WPW  I have  seen him over the years with my notes describing progressive loss of antegrade conduction and hence the introduction of verapamil and most recently digoxin for control of his palpitations.  He continues to put on weight. He got a big screen TV.  He has daytime somnolence but does use CPAP and has noted that his car was read in the last 8 months.  He has not had chest pain or shortness of breath. He's had no recent palpitations  Past Medical History  Diagnosis Date  . COPD (chronic obstructive pulmonary disease)   . SOB (shortness of breath)   . OSA (obstructive sleep apnea)   . HLD (hyperlipidemia)   . WPW (Wolff-Parkinson-White syndrome)   . Ischemic heart disease   . SVT (supraventricular tachycardia)   . PVC (premature ventricular contraction)   . Anxiety   . Obesity   . HTN (hypertension)   . Palpitation   . Umbilical hernia     Past Surgical History  Procedure Laterality Date  . Hernia repair      Current Outpatient Prescriptions  Medication Sig Dispense Refill  . ALPRAZolam (XANAX) 0.5 MG tablet Take 0.5 mg by mouth at bedtime as needed.        . Ascorbic Acid (VITAMIN C) 500 MG tablet Take 500 mg by mouth daily.        Marland Kitchen aspirin 325 MG tablet Take 325 mg by mouth daily.        . Fluticasone-Salmeterol (ADVAIR DISKUS) 250-50 MCG/DOSE AEPB Inhale 1 puff into the lungs every 12 (twelve) hours.        . furosemide (LASIX) 40 MG tablet Take 40 mg by mouth daily.        Marland Kitchen losartan (COZAAR) 100 MG tablet Take 100 mg by mouth daily.       . metoprolol (LOPRESSOR) 50 MG tablet Take 50 mg by mouth 2 (two) times daily.        . Multiple Vitamin (MULTIVITAMIN) tablet Take 1 tablet by mouth daily.        . niacin (NIASPAN) 1000 MG CR tablet Take 500 mg by mouth at bedtime.       . Omega-3 Fatty Acids (FISH OIL) 1200 MG CAPS Take by mouth daily.         . rosuvastatin (CRESTOR) 10 MG tablet Take 10 mg by mouth daily.        Marland Kitchen tiotropium (SPIRIVA) 18 MCG inhalation capsule Place 18 mcg into inhaler and inhale daily.        . verapamil (CALAN-SR) 240 MG CR tablet Take 1 tablet (240 mg total) by mouth 2 (two) times daily.  12 tablet  0   No current facility-administered medications for this visit.    Allergies  Allergen Reactions  . Codeine     Review of Systems negative except from HPI and PMH  Physical Exam BP 140/76  Pulse 74  Ht 5\' 9"  (1.753 m)  Wt 293 lb 6.4 oz (133.085 kg)  BMI 43.31 kg/m2  SpO2 88% Well developed and morbidly obese  in no acute distress HENT normal E scleral and icterus clear Neck Supple JVP flat; carotids brisk and full Clear to ausculation Regular rate and rhythm, no murmurs gallops or rub Soft with active bowel sounds No clubbing cyanosis and edema Alert and oriented, grossly  normal motor and sensory function; tremor reight hand Skin Warm and Dry  ECG sinus 62 Intervals 20/10/40 slowing in the lateral precordium with a R wave in lead V1 and V2 most consistent with evidence of preexcitation  Assessment and  Plan  1-WPW The patient had recurrent arrhythmia while in hospital. I have reviewed the strips. They're most consistent with atrial tachycardia with aberration. It is possible that was strips demonstrates atrial fibrillation is of some irregularity alternatively, he could represent various antegrade conduction velocities with loss of aberration associated with increase in cycle length based on a SVT VA accessory pathway.  We will continue him on his verapamil.  2-obesity-morbid we have again reviewed the importance of exercise  3-HFpEF-he is euvolemic. We'll continue his current medications.

## 2013-04-14 NOTE — Telephone Encounter (Signed)
No other info °

## 2013-06-02 ENCOUNTER — Other Ambulatory Visit: Payer: Self-pay

## 2013-06-02 ENCOUNTER — Telehealth: Payer: Self-pay | Admitting: Internal Medicine

## 2013-06-02 MED ORDER — VERAPAMIL HCL ER 240 MG PO TBCR: 240.0000 mg | EXTENDED_RELEASE_TABLET | Freq: Two times a day (BID) | ORAL | Status: DC

## 2013-06-02 NOTE — Telephone Encounter (Signed)
Please call patient back

## 2013-06-05 ENCOUNTER — Other Ambulatory Visit: Payer: Self-pay

## 2013-06-05 MED ORDER — VERAPAMIL HCL ER 240 MG PO TBCR
240.0000 mg | EXTENDED_RELEASE_TABLET | Freq: Two times a day (BID) | ORAL | Status: DC
Start: 2013-06-05 — End: 2014-07-02

## 2013-06-05 NOTE — Telephone Encounter (Signed)
Why  Do I need to call patient back

## 2013-09-06 ENCOUNTER — Ambulatory Visit (INDEPENDENT_AMBULATORY_CARE_PROVIDER_SITE_OTHER): Payer: Medicare Other | Admitting: Internal Medicine

## 2013-09-06 ENCOUNTER — Encounter: Payer: Self-pay | Admitting: Internal Medicine

## 2013-09-06 VITALS — BP 132/74 | HR 62 | Ht 70.0 in | Wt 282.0 lb

## 2013-09-06 DIAGNOSIS — I5032 Chronic diastolic (congestive) heart failure: Secondary | ICD-10-CM

## 2013-09-06 DIAGNOSIS — I456 Pre-excitation syndrome: Secondary | ICD-10-CM

## 2013-09-06 NOTE — Patient Instructions (Signed)
Your physician recommends that you continue on your current medications as directed. Please refer to the Current Medication list given to you today.  Your physician wants you to follow-up in: 1 year with Dr. Klein.  You will receive a reminder letter in the mail two months in advance. If you don't receive a letter, please call our office to schedule the follow-up appointment.  

## 2013-09-06 NOTE — Progress Notes (Signed)
Patient Care Team: Myer Peer, MD as PCP - General (Unknown Physician Specialty)   HPI  Chad Hogan is a 73 y.o. male Seen in followup for SVT    He has stable sob but no chest pain  No edema His hemoglobin A1c has gone from 5--6.1. He continues to not exercise.  He saw his pulmonologist this last week and thought things were stable  Past Medical History  Diagnosis Date  . COPD (chronic obstructive pulmonary disease)   . SOB (shortness of breath)   . OSA (obstructive sleep apnea)   . HLD (hyperlipidemia)   . WPW (Wolff-Parkinson-White syndrome)   . Ischemic heart disease   . SVT (supraventricular tachycardia)   . PVC (premature ventricular contraction)   . Anxiety   . Obesity   . HTN (hypertension)   . Palpitation   . Umbilical hernia     Past Surgical History  Procedure Laterality Date  . Hernia repair      Current Outpatient Prescriptions  Medication Sig Dispense Refill  . ALPRAZolam (XANAX) 0.5 MG tablet Take 0.5 mg by mouth at bedtime as needed.        Marland Kitchen amoxicillin (AMOXIL) 500 MG capsule       . amoxicillin-clavulanate (AUGMENTIN) 875-125 MG per tablet       . Ascorbic Acid (VITAMIN C) 500 MG tablet Take 500 mg by mouth daily.        Marland Kitchen aspirin 325 MG tablet Take 325 mg by mouth daily.        . Fluticasone-Salmeterol (ADVAIR DISKUS) 250-50 MCG/DOSE AEPB Inhale 1 puff into the lungs every 12 (twelve) hours.        . furosemide (LASIX) 40 MG tablet Take 40 mg by mouth daily.        Marland Kitchen losartan (COZAAR) 100 MG tablet Take 100 mg by mouth daily.       . metoprolol (LOPRESSOR) 50 MG tablet Take 50 mg by mouth 2 (two) times daily.        . Multiple Vitamin (MULTIVITAMIN) tablet Take 1 tablet by mouth daily.        . niacin (NIASPAN) 1000 MG CR tablet Take 500 mg by mouth at bedtime.       . Omega-3 Fatty Acids (FISH OIL) 1200 MG CAPS Take by mouth daily.        . rosuvastatin (CRESTOR) 10 MG tablet Take 10 mg by mouth daily.        Marland Kitchen tiotropium  (SPIRIVA) 18 MCG inhalation capsule Place 18 mcg into inhaler and inhale daily.        . verapamil (CALAN-SR) 240 MG CR tablet Take 1 tablet (240 mg total) by mouth 2 (two) times daily.  180 tablet  4   No current facility-administered medications for this visit.    Allergies  Allergen Reactions  . Codeine     Review of Systems negative except from HPI and PMH  Physical Exam BP 132/74  Pulse 62  Ht 5\' 10"  (1.778 m)  Wt 282 lb (127.914 kg)  BMI 40.46 kg/m2 Well developed and well nourished in no acute distress HENT normal E scleral and icterus clear Neck Supple JVP flat; carotids brisk and full Clear to ausculation  Regular rate and rhythm, no murmurs gallops or rub Soft with active bowel sounds No clubbing cyanosis  Edema Alert and oriented, grossly normal motor and sensory function Skin Warm and Dry  ECG demonstrates sinus rhythm at 62 Gen.  20/11/43 Axis of -42  Assessment and  Plan  SVT with loss of preexcitation  Morbid obesity  Hyperglycemia/prediabetes  Hypertension  Dyslipidemia  I recommended that he stop his niacin based on the recent trials. I strongly encouraged him to try to walk and see if VT and lose weight and thereby hopefully mitigate his progressive hyperglycemia prediabetic condition. There've been no interval changes in his functional status.

## 2014-01-29 LAB — PROTIME-INR

## 2014-02-07 LAB — COLOGUARD

## 2014-02-20 ENCOUNTER — Telehealth: Payer: Self-pay | Admitting: Gastroenterology

## 2014-02-26 ENCOUNTER — Encounter: Payer: Self-pay | Admitting: Gastroenterology

## 2014-04-19 ENCOUNTER — Ambulatory Visit: Payer: Medicare Other | Admitting: Internal Medicine

## 2014-04-23 NOTE — Telephone Encounter (Signed)
Pt scheduled  

## 2014-05-02 ENCOUNTER — Ambulatory Visit (INDEPENDENT_AMBULATORY_CARE_PROVIDER_SITE_OTHER): Payer: Commercial Managed Care - HMO | Admitting: Gastroenterology

## 2014-05-02 ENCOUNTER — Telehealth: Payer: Self-pay | Admitting: *Deleted

## 2014-05-02 VITALS — BP 132/70 | HR 61 | Ht 61.0 in | Wt 277.0 lb

## 2014-05-02 DIAGNOSIS — Z862 Personal history of diseases of the blood and blood-forming organs and certain disorders involving the immune mechanism: Secondary | ICD-10-CM

## 2014-05-02 DIAGNOSIS — I2699 Other pulmonary embolism without acute cor pulmonale: Secondary | ICD-10-CM | POA: Diagnosis not present

## 2014-05-02 MED ORDER — NA SULFATE-K SULFATE-MG SULF 17.5-3.13-1.6 GM/177ML PO SOLN: 1.0000 | Freq: Once | ORAL | Status: DC

## 2014-05-02 NOTE — Patient Instructions (Signed)
You have been scheduled for an endoscopy and colonoscopy. Please follow the written instructions given to you at your visit today. Please pick up your prep at the pharmacy within the next 1-3 days. If you use inhalers (even only as needed), please bring them with you on the day of your procedure. Your physician has requested that you go to www.startemmi.com and enter the access code given to you at your visit today. This web site gives a general overview about your procedure. However, you should still follow specific instructions given to you by our office regarding your preparation for the procedure.  You will be contaced by our office prior to your procedure for directions on holding your Coumadin/Warfarin.  If you do not hear from our office 1 week prior to your scheduled procedure, please call [phone] to discuss.

## 2014-05-02 NOTE — Assessment & Plan Note (Signed)
Patient has history of recent iron deficiency anemia.  He has had no overt GI bleeding and offers no GI complaints.  A Cologuard test was positive raising the question of a colonic advanced neoplasm.  Recommendations #1 colonoscopy and, if negative,  upper endoscopy.  Because of the patient's comorbidities this test will be done at the hospital endoscopy suite.  Whether or not Coumadin can be held will be decided by his PCP.  The risk of holding anticoagulation therapy or antiplatelet medications was discussed including the increased risk for thromboembolic disease that may include DVT, pulmonary emboli and stroke. The patient understands this risk and is willing to proceed with temporally holding the medication provided that this is approved by her PCP or cardiologist.

## 2014-05-02 NOTE — Telephone Encounter (Signed)
  05/02/2014   RE: Chad Hogan DOB: 1940/11/18 MRN: [id number]   Dear Cleda Mccreedy,    We have scheduled the above patient for an endoscopic procedure. Our records show that he is on anticoagulation therapy.   Please advise as to how long the patient may come off his therapy of Coumadin prior to the procedure, which is scheduled for 06/11/2014.  Please fax back/ or route the completed form to [place] at [phone].   Sincerely,    Genella Mech

## 2014-05-02 NOTE — Progress Notes (Signed)
_                                                                                                                History of Present Illness:  Chad Hogan is a 74 year old white male with medical problems including ischemic heart disease, pulmonary fibrosis, COPD, sleep apnea, morbid obesity, and pulmonary emboli, on Coumadin, referred for evaluation of anemia.  He  was hospitalized in October, 2015 for symptomatic iron deficiency anemia.  He was complaining of generalized weakness.  Evaluation included CT scanning that demonstrated pulmonary emboli.  He was placed on anticoagulants and discharged on Coumadin.  Cirrhosis was raised by CT scan and there was described some periportal lymphadenopathy.  PET scan was negative.  He was placed on iron.  Last hemoglobin in December was 15.  A stool Cologard test returned positive.  He denies change of bowel habits, rectal bleeding, melena, dysphagia or pyrosis.  He's on no gastric irritants including nonsteroidals.  He still complains of fatigue and "not feeling right in the head".   Past Medical History  Diagnosis Date  . COPD (chronic obstructive pulmonary disease)   . SOB (shortness of breath)   . OSA (obstructive sleep apnea)   . HLD (hyperlipidemia)   . WPW (Wolff-Parkinson-White syndrome)   . Ischemic heart disease   . SVT (supraventricular tachycardia)   . PVC (premature ventricular contraction)   . Anxiety   . Obesity   . HTN (hypertension)   . Palpitation   . Umbilical hernia    Past Surgical History  Procedure Laterality Date  . Hernia repair     family history includes Heart disease in an other family member; Liver cancer in an other family member. Current Outpatient Prescriptions  Medication Sig Dispense Refill  . ALPRAZolam (XANAX) 0.5 MG tablet Take 0.5 mg by mouth at bedtime as needed.      Marland Kitchen aspirin 325 MG tablet Take 325 mg by mouth daily.      . Fluticasone-Salmeterol (ADVAIR DISKUS) 250-50 MCG/DOSE AEPB Inhale  1 puff into the lungs every 12 (twelve) hours.      . furosemide (LASIX) 40 MG tablet Take 40 mg by mouth daily.      Marland Kitchen losartan (COZAAR) 100 MG tablet Take 100 mg by mouth daily.     . metoprolol (LOPRESSOR) 50 MG tablet Take 50 mg by mouth 2 (two) times daily.      . Multiple Vitamin (MULTIVITAMIN) tablet Take 1 tablet by mouth daily.      . niacin (NIASPAN) 1000 MG CR tablet Take 500 mg by mouth at bedtime.     . Omega-3 Fatty Acids (FISH OIL) 1200 MG CAPS Take by mouth daily.      Marland Kitchen omeprazole (PRILOSEC) 20 MG capsule   1  . rosuvastatin (CRESTOR) 10 MG tablet Take 10 mg by mouth daily.      Marland Kitchen tiotropium (SPIRIVA) 18 MCG inhalation capsule Place 18 mcg into inhaler and inhale daily.      . verapamil (  CALAN-SR) 240 MG CR tablet Take 1 tablet (240 mg total) by mouth 2 (two) times daily. 180 tablet 4  . warfarin (COUMADIN) 1 MG tablet   3  . warfarin (COUMADIN) 5 MG tablet   3  . warfarin (COUMADIN) 7.5 MG tablet   3   No current facility-administered medications for this visit.   Allergies as of 05/02/2014 - Review Complete 05/02/2014  Allergen Reaction Noted  . Codeine      reports that he quit smoking about 18 years ago. He does not have any smokeless tobacco history on file. His alcohol and drug histories are not on file.   Review of Systems: Pertinent positive and negative review of systems were noted in the above HPI section. All other review of systems were otherwise negative.  Vital signs were reviewed in today's medical record Physical Exam: General: Obese male in no acute distress Skin: anicteric Head: Normocephalic and atraumatic Eyes:  sclerae anicteric, EOMI Ears: Normal auditory acuity Mouth: No deformity or lesions Neck: Supple, no masses or thyromegaly Lungs: Clear throughout to auscultation Heart: Regular rate and rhythm; no murmurs, rubs or bruits Abdomen: Soft, non tender and non distended. No masses, hepatosplenomegaly or hernias noted. Normal Bowel  sounds Rectal:deferred Musculoskeletal: Symmetrical with no gross deformities  Skin: No lesions on visible extremities Pulses:  Normal pulses noted Extremities: No clubbing, cyanosis, edema or deformities noted Neurological: Alert oriented x 4, grossly nonfocal Cervical Nodes:  No significant cervical adenopathy Inguinal Nodes: No significant inguinal adenopathy Psychological:  Alert and cooperative. Normal mood and affect  See Assessment and Plan under Problem List

## 2014-05-02 NOTE — Assessment & Plan Note (Signed)
Diagnosed in October, 2015 and placed on Coumadin.

## 2014-05-07 NOTE — Telephone Encounter (Signed)
Can stop as needed for procedure

## 2014-05-08 NOTE — Telephone Encounter (Signed)
How many days can he hold?

## 2014-05-09 DIAGNOSIS — G4733 Obstructive sleep apnea (adult) (pediatric): Secondary | ICD-10-CM | POA: Diagnosis not present

## 2014-05-09 DIAGNOSIS — J449 Chronic obstructive pulmonary disease, unspecified: Secondary | ICD-10-CM | POA: Diagnosis not present

## 2014-05-11 DIAGNOSIS — Z7901 Long term (current) use of anticoagulants: Secondary | ICD-10-CM | POA: Diagnosis not present

## 2014-05-11 NOTE — Telephone Encounter (Signed)
As many days as you need 4-5 days is normal

## 2014-05-14 NOTE — Telephone Encounter (Signed)
Called patient and made him aware that he can hold coumadin 5 days before his procedure per Dr Jens Som  Patient wanted me to call his PCP Dr Ann Held and make him aware that he is having to come off coumadin and is having a colonoscopy on 2/29  Called and informed Dr Ventura Bruns office of the information

## 2014-05-28 ENCOUNTER — Other Ambulatory Visit: Payer: Self-pay

## 2014-05-28 ENCOUNTER — Telehealth: Payer: Self-pay | Admitting: Gastroenterology

## 2014-05-28 NOTE — Telephone Encounter (Signed)
Spoke with patient. He had questions about the day of his colonoscopy and his 45 minute ride to the endo dept. And his MAC.

## 2014-06-01 ENCOUNTER — Encounter (HOSPITAL_COMMUNITY): Payer: Self-pay | Admitting: *Deleted

## 2014-06-09 DIAGNOSIS — G4733 Obstructive sleep apnea (adult) (pediatric): Secondary | ICD-10-CM | POA: Diagnosis not present

## 2014-06-11 ENCOUNTER — Encounter (HOSPITAL_COMMUNITY)
Admission: RE | Disposition: A | Discharge status: Home / Self Care | Payer: Self-pay | Source: Ambulatory Visit | Attending: Gastroenterology

## 2014-06-11 ENCOUNTER — Encounter (HOSPITAL_COMMUNITY): Payer: Self-pay | Admitting: Registered Nurse

## 2014-06-11 ENCOUNTER — Ambulatory Visit (HOSPITAL_COMMUNITY): Payer: Commercial Managed Care - HMO | Admitting: Registered Nurse

## 2014-06-11 ENCOUNTER — Ambulatory Visit (HOSPITAL_COMMUNITY)
Admission: RE | Admit: 2014-06-11 | Discharge: 2014-06-11 | Disposition: A | Discharge status: Home / Self Care | Payer: Commercial Managed Care - HMO | Source: Ambulatory Visit | Attending: Gastroenterology | Admitting: Gastroenterology

## 2014-06-11 DIAGNOSIS — K5730 Diverticulosis of large intestine without perforation or abscess without bleeding: Secondary | ICD-10-CM | POA: Diagnosis not present

## 2014-06-11 DIAGNOSIS — K269 Duodenal ulcer, unspecified as acute or chronic, without hemorrhage or perforation: Secondary | ICD-10-CM | POA: Diagnosis not present

## 2014-06-11 DIAGNOSIS — D125 Benign neoplasm of sigmoid colon: Secondary | ICD-10-CM | POA: Insufficient documentation

## 2014-06-11 DIAGNOSIS — Z886 Allergy status to analgesic agent status: Secondary | ICD-10-CM | POA: Diagnosis not present

## 2014-06-11 DIAGNOSIS — K648 Other hemorrhoids: Secondary | ICD-10-CM | POA: Insufficient documentation

## 2014-06-11 DIAGNOSIS — Z87891 Personal history of nicotine dependence: Secondary | ICD-10-CM | POA: Diagnosis not present

## 2014-06-11 DIAGNOSIS — K644 Residual hemorrhoidal skin tags: Secondary | ICD-10-CM

## 2014-06-11 DIAGNOSIS — Z7901 Long term (current) use of anticoagulants: Secondary | ICD-10-CM | POA: Diagnosis not present

## 2014-06-11 DIAGNOSIS — D509 Iron deficiency anemia, unspecified: Secondary | ICD-10-CM | POA: Diagnosis not present

## 2014-06-11 DIAGNOSIS — Z86711 Personal history of pulmonary embolism: Secondary | ICD-10-CM | POA: Diagnosis not present

## 2014-06-11 DIAGNOSIS — J449 Chronic obstructive pulmonary disease, unspecified: Secondary | ICD-10-CM | POA: Insufficient documentation

## 2014-06-11 DIAGNOSIS — K635 Polyp of colon: Secondary | ICD-10-CM | POA: Diagnosis not present

## 2014-06-11 DIAGNOSIS — Z7982 Long term (current) use of aspirin: Secondary | ICD-10-CM | POA: Diagnosis not present

## 2014-06-11 DIAGNOSIS — F419 Anxiety disorder, unspecified: Secondary | ICD-10-CM | POA: Diagnosis not present

## 2014-06-11 DIAGNOSIS — I10 Essential (primary) hypertension: Secondary | ICD-10-CM | POA: Diagnosis not present

## 2014-06-11 DIAGNOSIS — D126 Benign neoplasm of colon, unspecified: Secondary | ICD-10-CM | POA: Diagnosis not present

## 2014-06-11 DIAGNOSIS — E785 Hyperlipidemia, unspecified: Secondary | ICD-10-CM | POA: Diagnosis not present

## 2014-06-11 DIAGNOSIS — Z862 Personal history of diseases of the blood and blood-forming organs and certain disorders involving the immune mechanism: Secondary | ICD-10-CM

## 2014-06-11 DIAGNOSIS — K319 Disease of stomach and duodenum, unspecified: Secondary | ICD-10-CM | POA: Diagnosis not present

## 2014-06-11 DIAGNOSIS — G4733 Obstructive sleep apnea (adult) (pediatric): Secondary | ICD-10-CM | POA: Insufficient documentation

## 2014-06-11 DIAGNOSIS — I2699 Other pulmonary embolism without acute cor pulmonale: Secondary | ICD-10-CM

## 2014-06-11 DIAGNOSIS — K3189 Other diseases of stomach and duodenum: Secondary | ICD-10-CM

## 2014-06-11 HISTORY — PX: COLONOSCOPY WITH PROPOFOL: SHX5780

## 2014-06-11 HISTORY — PX: ESOPHAGOGASTRODUODENOSCOPY (EGD) WITH PROPOFOL: SHX5813

## 2014-06-11 LAB — CBC
HEMATOCRIT: 49.6 % (ref 39.0–52.0)
HEMOGLOBIN: 15.9 g/dL (ref 13.0–17.0)
MCH: 27.7 pg (ref 26.0–34.0)
MCHC: 32.1 g/dL (ref 30.0–36.0)
MCV: 86.6 fL (ref 78.0–100.0)
Platelets: 203 10*3/uL (ref 150–400)
RBC: 5.73 MIL/uL (ref 4.22–5.81)
RDW: 13.5 % (ref 11.5–15.5)
WBC: 8.5 10*3/uL (ref 4.0–10.5)

## 2014-06-11 LAB — BASIC METABOLIC PANEL
Anion gap: 7 (ref 5–15)
BUN: 15 mg/dL (ref 6–23)
CHLORIDE: 103 mmol/L (ref 96–112)
CO2: 26 mmol/L (ref 19–32)
Calcium: 8.9 mg/dL (ref 8.4–10.5)
Creatinine, Ser: 0.91 mg/dL (ref 0.50–1.35)
GFR calc Af Amer: >90 mL/min (ref >90)
GFR, EST NON AFRICAN AMERICAN: 81 mL/min — AB (ref >90)
Glucose, Bld: 105 mg/dL — ABNORMAL HIGH (ref 70–99)
POTASSIUM: 4.4 mmol/L (ref 3.5–5.1)
SODIUM: 136 mmol/L (ref 135–145)

## 2014-06-11 SURGERY — COLONOSCOPY WITH PROPOFOL
Anesthesia: Monitor Anesthesia Care

## 2014-06-11 MED ORDER — PROPOFOL INFUSION 10 MG/ML OPTIME
INTRAVENOUS | Status: DC | PRN
  Administered 2014-06-11: 100 ug/kg/min via INTRAVENOUS

## 2014-06-11 MED ORDER — SODIUM CHLORIDE 0.9 % IV SOLN: INTRAVENOUS | Status: DC

## 2014-06-11 MED ORDER — FENTANYL CITRATE 0.05 MG/ML IJ SOLN
INTRAMUSCULAR | Status: DC | PRN
  Administered 2014-06-11: 100 ug via INTRAVENOUS

## 2014-06-11 MED ORDER — EPHEDRINE SULFATE 50 MG/ML IJ SOLN
INTRAMUSCULAR | Status: AC
  Filled 2014-06-11: qty 1

## 2014-06-11 MED ORDER — SPOT INK MARKER SYRINGE KIT
PACK | SUBMUCOSAL | Status: AC
  Filled 2014-06-11: qty 5

## 2014-06-11 MED ORDER — BUTAMBEN-TETRACAINE-BENZOCAINE 2-2-14 % EX AERO
INHALATION_SPRAY | CUTANEOUS | Status: DC | PRN
  Administered 2014-06-11: 2 via TOPICAL

## 2014-06-11 MED ORDER — LACTATED RINGERS IV SOLN
INTRAVENOUS | Status: DC
  Administered 2014-06-11: 1000 mL via INTRAVENOUS

## 2014-06-11 MED ORDER — LIDOCAINE HCL (CARDIAC) 20 MG/ML IV SOLN
INTRAVENOUS | Status: AC
  Filled 2014-06-11: qty 5

## 2014-06-11 MED ORDER — PROPOFOL 10 MG/ML IV BOLUS
INTRAVENOUS | Status: AC
  Filled 2014-06-11: qty 20

## 2014-06-11 MED ORDER — ATROPINE SULFATE 0.4 MG/ML IJ SOLN
INTRAMUSCULAR | Status: AC
  Filled 2014-06-11: qty 2

## 2014-06-11 MED ORDER — SPOT INK MARKER SYRINGE KIT
PACK | SUBMUCOSAL | Status: DC | PRN
  Administered 2014-06-11: 1 mL via SUBMUCOSAL

## 2014-06-11 MED ORDER — SODIUM CHLORIDE 0.9 % IJ SOLN
INTRAMUSCULAR | Status: AC
  Filled 2014-06-11: qty 10

## 2014-06-11 SURGICAL SUPPLY — 25 items

## 2014-06-11 NOTE — Anesthesia Preprocedure Evaluation (Addendum)
Anesthesia Evaluation  Patient identified by MRN, date of birth, ID band Patient awake    Reviewed: Allergy & Precautions, NPO status , Patient's Chart, lab work & pertinent test results  Airway Mallampati: III  TM Distance: <3 FB Neck ROM: Full    Dental   Pulmonary sleep apnea , COPD COPD inhaler, former smoker,  breath sounds clear to auscultation        Cardiovascular hypertension, Pt. on medications + dysrhythmias Rhythm:Regular Rate:Normal     Neuro/Psych Anxiety negative neurological ROS     GI/Hepatic negative GI ROS, Neg liver ROS,   Endo/Other  Morbid obesity  Renal/GU negative Renal ROS     Musculoskeletal   Abdominal   Peds  Hematology negative hematology ROS (+)   Anesthesia Other Findings   Reproductive/Obstetrics                          Anesthesia Physical Anesthesia Plan  ASA: III  Anesthesia Plan: MAC   Post-op Pain Management:    Induction: Intravenous  Airway Management Planned: Natural Airway, Nasal Cannula and Simple Face Mask  Additional Equipment:   Intra-op Plan:   Post-operative Plan:   Informed Consent: I have reviewed the patients History and Physical, chart, labs and discussed the procedure including the risks, benefits and alternatives for the proposed anesthesia with the patient or authorized representative who has indicated his/her understanding and acceptance.     Plan Discussed with: CRNA  Anesthesia Plan Comments:         Anesthesia Quick Evaluation

## 2014-06-11 NOTE — Op Note (Addendum)
[facility] [place], [address]   COLONOSCOPY PROCEDURE REPORT     EXAM DATE: 06/11/2014  PATIENT NAME:      Chad Hogan, Chad Hogan           MR #:      [id number] BIRTHDATE:       14-Nov-1940      VISIT #:     [id number]  ATTENDING:     Inda Castle, MD     STATUS:     outpatient ASSISTANT:      Cletis Athens and Carmie End  INDICATIONS:  The patient is a 74 yr old male here for a colonoscopy due to iron deficiency anemia. positive Cologuard PROCEDURE PERFORMED:     Colonoscopy with snare polypectomy Submucosal injection, any substance MEDICATIONS:     Monitored anesthesia care ESTIMATED BLOOD LOSS:     None  CONSENT: The patient understands the risks and benefits of the procedure and understands that these risks include, but are not limited to: sedation, allergic reaction, infection, perforation and/or bleeding. Alternative means of evaluation and treatment include, among others: physical exam, x-rays, and/or surgical intervention. The patient elects to proceed with this endoscopic procedure.  DESCRIPTION OF PROCEDURE: During intra-op preparation period all mechanical & medical equipment was checked for proper function. Hand hygiene and appropriate measures for infection prevention was taken. After the risks, benefits and alternatives of the procedure were thoroughly explained, Informed consent was verified, confirmed and timeout was successfully executed by the treatment team. A digital exam revealed external hemorrhoids and revealed internal hemorrhoids. The Pentax Adult Colonscope [id number] endoscope was introduced through the anus and advanced to the ileum. excellent using Suprep The instrument was then slowly withdrawn as the colon was fully examined.   COLON FINDINGS: There was severe diverticulosis noted in the descending colon and sigmoid colon with associated colonic spasm and colonic narrowing.   Internal hemorrhoids were  found.   A sessile polyp measuring 15 mm in size was found in the proximal sigmoid colon.  A polypectomy was performed using snare cautery. 1 cc of "spot" was injected submucosally to tattoo the area. The resection was complete, the polyp tissue was completely retrieved and sent to histology. Retroflexed views revealed no abnormalities. In the rectal vault there were specks of fresh red blood.The scope was then completely withdrawn from the patient and the procedure terminated. SCOPE WITHDRAWAL TIME: 17 minutes    ADVERSE EVENTS:      There were no immediate complications.  IMPRESSIONS:     1.  There was severe diverticulosis noted in the descending colon and sigmoid colon 2.  Internal hemorrhoids 3.  Sessile polyp was found in the proximal sigmoid colon; polypectomy was performed using snare cautery  RECOMMENDATIONS:     1.  Await biopsy results 2.  Restart Coumadin in a.m. RECALL:  _____________________________ Inda Castle, MD eSigned:  Inda Castle, MD 06/11/2014 10:50 AM   cc:  Virl Axe, M.D., Berton Lan, MD   CPT CODES: ICD CODES:  The ICD and CPT codes recommended by this software are interpretations from the data that the clinical staff has captured with the software.  The verification of the translation of this report to the ICD and CPT codes and modifiers is the sole responsibility of the health care institution and practicing physician where this report was generated.  [employer]. will not be held responsible for the validity of the ICD and CPT codes included on  this report.  AMA assumes no liability for data contained or not contained herein. C[employer] is a Designer, television/film set of the [employer].   DOCUMENT ADDENDUM eSigned:  Inda Castle, MD 06/11/2014 11:06 AM  Reason for addendum: [ ]  Correction of inaccurate information [ ]  Recently acquired lab/pathology results [x]  Additional  information  Comments:  cc Ann Held, MD   PATIENT NAME:  Chad Hogan, Chad Hogan MR#: [id number]

## 2014-06-11 NOTE — Op Note (Addendum)
[place], [address]   ENDOSCOPY PROCEDURE REPORT  PATIENT: Chad Hogan, Chad Hogan  MR#: [id number] BIRTHDATE: [birth year] , 73  yrs. old GENDER: male ENDOSCOPIST: Inda Castle, MD REFERRED BY: PROCEDURE DATE:  06/11/2014 PROCEDURE:  EGD w/ biopsy ASA CLASS:     Class III INDICATIONS:  iron deficiency anemia. MEDICATIONS: Residual sedation present and Monitored anesthesia care  TOPICAL ANESTHETIC:  DESCRIPTION OF PROCEDURE: After the risks benefits and alternatives of the procedure were thoroughly explained, informed consent was obtained.  The Pentax Gastroscope [id number] endoscope was introduced through the mouth and advanced to the second portion of the duodenum , Without limitations.  The instrument was slowly withdrawn as the mucosa was fully examined.    Submucosal 1cm mass with ulcer, second portion duodenum.  There was a superficial ulcer with a minimal amount of blood at the center of the mass.  Biopsies were taken.  The remainder the exam including the esophagus stomach and proximal duodenum was normal. The scope was then withdrawn from the patient and the procedure completed.  COMPLICATIONS: There were no immediate complications.  ENDOSCOPIC IMPRESSION: benign appearance of mucosal mass in duodenum with superficial ulcer   RECOMMENDATIONS: Await biopsy results  REPEAT EXAM:  eSigned:  Inda Castle, MD 06/11/2014 10:55 AM    CC:[Referring Physician] Virl Axe, M.D., Brand Males, M.D.   DOCUMENT ADDENDUM eSigned:  Inda Castle, MD 06/11/2014 11:07 AM  Reason for addendum: [ ]  Correction of inaccurate information [ ]  Recently acquired lab/pathology results [x]  Additional information  Comments:  CC Dr. Ann Held, M.D.   PATIENT NAME:  [name], [name] MR#: [id number]

## 2014-06-11 NOTE — Anesthesia Procedure Notes (Signed)
Procedure Name: MAC Date/Time: 06/11/2014 9:56 AM Performed by: Carleene Cooper A Pre-anesthesia Checklist: Patient identified, Timeout performed, Emergency Drugs available, Suction available and Patient being monitored Patient Re-evaluated:Patient Re-evaluated prior to inductionOxygen Delivery Method: Simple face mask and Nasal cannula Dental Injury: Teeth and Oropharynx as per pre-operative assessment

## 2014-06-11 NOTE — Discharge Instructions (Signed)
Resume Coumadin in a.m. Colonoscopy A colonoscopy is an exam to look at the entire large intestine (colon). This exam can help find problems such as tumors, polyps, inflammation, and areas of bleeding. The exam takes about 1 hour.  LET [facility] CARE PROVIDER KNOW ABOUT:   Any allergies you have.  All medicines you are taking, including vitamins, herbs, eye drops, creams, and over-the-counter medicines.  Previous problems you or members of your family have had with the use of anesthetics.  Any blood disorders you have.  Previous surgeries you have had.  Medical conditions you have. RISKS AND COMPLICATIONS  Generally, this is a safe procedure. However, as with any procedure, complications can occur. Possible complications include:  Bleeding.  Tearing or rupture of the colon wall.  Reaction to medicines given during the exam.  Infection (rare). BEFORE THE PROCEDURE   Ask your health care provider about changing or stopping your regular medicines.  You may be prescribed an oral bowel prep. This involves drinking a large amount of medicated liquid, starting the day before your procedure. The liquid will cause you to have multiple loose stools until your stool is almost clear or light green. This cleans out your colon in preparation for the procedure.  Do not eat or drink anything else once you have started the bowel prep, unless your health care provider tells you it is safe to do so.  Arrange for someone to drive you home after the procedure. PROCEDURE   You will be given medicine to help you relax (sedative).  You will lie on your side with your knees bent.  A long, flexible tube with a light and camera on the end (colonoscope) will be inserted through the rectum and into the colon. The camera sends video back to a computer screen as it moves through the colon. The colonoscope also releases carbon dioxide gas to inflate the colon. This helps your health care provider see the  area better.  During the exam, your health care provider may take a small tissue sample (biopsy) to be examined under a microscope if any abnormalities are found.  The exam is finished when the entire colon has been viewed. AFTER THE PROCEDURE   Do not drive for 24 hours after the exam.  You may have a small amount of blood in your stool.  You may pass moderate amounts of gas and have mild abdominal cramping or bloating. This is caused by the gas used to inflate your colon during the exam.  Ask when your test results will be ready and how you will get your results. Make sure you get your test results. Document Released: 03/27/2000 Document Revised: 01/18/2013 Document Reviewed: 12/05/2012 [facility] Patient Information 2015 [place], [place]. This information is not intended to replace advice given to you by your health care provider. Make sure you discuss any questions you have with your health care provider.  Colonoscopy A colonoscopy is an exam to look at your colon. This exam can help find lumps (tumors), growths (polyps), bleeding, and redness and puffiness (inflammation) in your colon.  BEFORE THE PROCEDURE  Ask your doctor about changing or stopping your regular medicines.  You may need to drink a large amount of a special liquid (oral bowel prep). You start drinking this the day before your procedure. It will cause you to have watery poop (stool). This cleans out your colon.  Do not eat or drink anything else once you have started the bowel prep, unless your doctor tells  you it is safe to do so.  Make plans for someone to drive you home after the procedure. PROCEDURE  You will be given medicine to help you relax (sedative).  You will lie on your side with your knees bent.  A tube with a camera on the end is put in the opening of your butt (anus) and into your colon. Pictures are sent to a computer screen. Your doctor will look for anything that is not normal.  Your doctor may  take a tissue sample (biopsy) from your colon to be looked at more closely.  The exam is finished when your doctor has viewed all of the colon. AFTER THE PROCEDURE  Do not drive for 24 hours after the exam.  You may have a small amount of blood in your poop. This is normal.  You may pass gas and have belly (abdominal) cramps. This is normal.  Ask when your test results will be ready. Make sure you get your test results. Document Released: 05/02/2010 Document Revised: 04/04/2013 Document Reviewed: 12/05/2012 [facility] Patient Information 2015 [place], [place]. This information is not intended to replace advice given to you by your health care provider. Make sure you discuss any questions you have with your health care provider.  Colon Polyps Polyps are lumps of extra tissue growing inside the body. Polyps can grow in the large intestine (colon). Most colon polyps are noncancerous (benign). However, some colon polyps can become cancerous over time. Polyps that are larger than a pea may be harmful. To be safe, caregivers remove and test all polyps. CAUSES  Polyps form when mutations in the genes cause your cells to grow and divide even though no more tissue is needed. RISK FACTORS There are a number of risk factors that can increase your chances of getting colon polyps. They include:  Being older than 50 years.  Family history of colon polyps or colon cancer.  Long-term colon diseases, such as colitis or Crohn disease.  Being overweight.  Smoking.  Being inactive.  Drinking too much alcohol. SYMPTOMS  Most small polyps do not cause symptoms. If symptoms are present, they may include:  Blood in the stool. The stool may look dark red or black.  Constipation or diarrhea that lasts longer than 1 week. DIAGNOSIS People often do not know they have polyps until their caregiver finds them during a regular checkup. Your caregiver can use 4 tests to check for polyps:  Digital rectal  exam. The caregiver wears gloves and feels inside the rectum. This test would find polyps only in the rectum.  Barium enema. The caregiver puts a liquid called barium into your rectum before taking X-rays of your colon. Barium makes your colon look white. Polyps are dark, so they are easy to see in the X-ray pictures.  Sigmoidoscopy. A thin, flexible tube (sigmoidoscope) is placed into your rectum. The sigmoidoscope has a light and tiny camera in it. The caregiver uses the sigmoidoscope to look at the last third of your colon.  Colonoscopy. This test is like sigmoidoscopy, but the caregiver looks at the entire colon. This is the most common method for finding and removing polyps. TREATMENT  Any polyps will be removed during a sigmoidoscopy or colonoscopy. The polyps are then tested for cancer. PREVENTION  To help lower your risk of getting more colon polyps:  Eat plenty of fruits and vegetables. Avoid eating fatty foods.  Do not smoke.  Avoid drinking alcohol.  Exercise every day.  Lose weight if recommended  by your caregiver.  Eat plenty of calcium and folate. Foods that are rich in calcium include milk, cheese, and broccoli. Foods that are rich in folate include chickpeas, kidney beans, and spinach. HOME CARE INSTRUCTIONS Keep all follow-up appointments as directed by your caregiver. You may need periodic exams to check for polyps. SEEK MEDICAL CARE IF: You notice bleeding during a bowel movement. Document Released: 12/25/2003 Document Revised: 06/22/2011 Document Reviewed: 06/09/2011 [facility] Patient Information 2015 [place], [place]. This information is not intended to replace advice given to you by your health care provider. Make sure you discuss any questions you have with your health care provider. Colonoscopy, Care After Refer to this sheet in the next few weeks. These instructions provide you with information on caring for yourself after your procedure. Your health care provider  may also give you more specific instructions. Your treatment has been planned according to current medical practices, but problems sometimes occur. Call your health care provider if you have any problems or questions after your procedure. WHAT TO EXPECT AFTER THE PROCEDURE  After your procedure, it is typical to have the following:  A small amount of blood in your stool.  Moderate amounts of gas and mild abdominal cramping or bloating. HOME CARE INSTRUCTIONS  Do not drive, operate machinery, or sign important documents for 24 hours.  You may shower and resume your regular physical activities, but move at a slower pace for the first 24 hours.  Take frequent rest periods for the first 24 hours.  Walk around or put a warm pack on your abdomen to help reduce abdominal cramping and bloating.  Drink enough fluids to keep your urine clear or pale yellow.  You may resume your normal diet as instructed by your health care provider. Avoid heavy or fried foods that are hard to digest.  Avoid drinking alcohol for 24 hours or as instructed by your health care provider.  Only take over-the-counter or prescription medicines as directed by your health care provider.  If a tissue sample (biopsy) was taken during your procedure:  Do not take aspirin or blood thinners for 7 days, or as instructed by your health care provider.  Do not drink alcohol for 7 days, or as instructed by your health care provider.  Eat soft foods for the first 24 hours. SEEK MEDICAL CARE IF: You have persistent spotting of blood in your stool 2-3 days after the procedure. SEEK IMMEDIATE MEDICAL CARE IF:  You have more than a small spotting of blood in your stool.  You pass large blood clots in your stool.  Your abdomen is swollen (distended).  You have nausea or vomiting.  You have a fever.  You have increasing abdominal pain that is not relieved with medicine. Document Released: 11/12/2003 Document Revised:  01/18/2013 Document Reviewed: 12/05/2012 [facility] Patient Information 2015 [place], [place]. This information is not intended to replace advice given to you by your health care provider. Make sure you discuss any questions you have with your health care provider. Patient will benefit from ongoing skilled PT services in {setting:3041680} to continue to advance safe functional mobility, address ongoing impairments in ***, and minimize fall risk.

## 2014-06-11 NOTE — Anesthesia Postprocedure Evaluation (Signed)
  Anesthesia Post-op Note  Patient: Chad Hogan  Procedure(s) Performed: Procedure(s): COLONOSCOPY WITH PROPOFOL (N/A) ESOPHAGOGASTRODUODENOSCOPY (EGD) WITH PROPOFOL (N/A)  Patient Location: PACU  Anesthesia Type:MAC  Level of Consciousness: awake and alert   Airway and Oxygen Therapy: Patient Spontanous Breathing  Post-op Pain: none  Post-op Assessment: Post-op Vital signs reviewed  Post-op Vital Signs: Reviewed  Last Vitals:  Filed Vitals:   06/11/14 1100  BP: 174/74  Pulse: 56  Temp:   Resp: 18    Complications: No apparent anesthesia complications

## 2014-06-11 NOTE — H&P (Signed)
_      History of Present Illness: Chad Hogan is a 74 year old white male with medical problems including ischemic heart disease, pulmonary fibrosis, COPD, sleep apnea, morbid obesity, and pulmonary emboli, on Coumadin, referred for evaluation of anemia. He was hospitalized in October, 2015 for symptomatic iron deficiency anemia. He was complaining of generalized weakness. Evaluation included CT scanning that demonstrated pulmonary emboli. He was placed on anticoagulants and discharged on Coumadin. Cirrhosis was raised by CT scan and there was described some periportal lymphadenopathy. PET scan was negative. He was placed on iron. Last hemoglobin in December was 15. A stool Cologard test returned positive. He denies change of bowel habits, rectal bleeding, melena, dysphagia or pyrosis. He's on no gastric irritants including nonsteroidals. He still complains of fatigue and "not feeling right in the head".   Past Medical History  Diagnosis Date  . COPD (chronic obstructive pulmonary disease)   . SOB (shortness of breath)   . OSA (obstructive sleep apnea)   . HLD (hyperlipidemia)   . WPW (Wolff-Parkinson-White syndrome)   . Ischemic heart disease   . SVT (supraventricular tachycardia)   . PVC (premature ventricular contraction)   . Anxiety   . Obesity   . HTN (hypertension)   . Palpitation   . Umbilical hernia    Past Surgical History  Procedure Laterality Date  . Hernia repair     family history includes Heart disease in an other family member; Liver cancer in an other family member. Current Outpatient Prescriptions  Medication Sig Dispense Refill  . ALPRAZolam (XANAX) 0.5 MG tablet Take 0.5 mg by mouth at bedtime as needed.     Marland Kitchen aspirin 325 MG tablet Take 325 mg by mouth daily.     .  Fluticasone-Salmeterol (ADVAIR DISKUS) 250-50 MCG/DOSE AEPB Inhale 1 puff into the lungs every 12 (twelve) hours.     . furosemide (LASIX) 40 MG tablet Take 40 mg by mouth daily.     Marland Kitchen losartan (COZAAR) 100 MG tablet Take 100 mg by mouth daily.     . metoprolol (LOPRESSOR) 50 MG tablet Take 50 mg by mouth 2 (two) times daily.     . Multiple Vitamin (MULTIVITAMIN) tablet Take 1 tablet by mouth daily.     . niacin (NIASPAN) 1000 MG CR tablet Take 500 mg by mouth at bedtime.     . Omega-3 Fatty Acids (FISH OIL) 1200 MG CAPS Take by mouth daily.     Marland Kitchen omeprazole (PRILOSEC) 20 MG capsule   1  . rosuvastatin (CRESTOR) 10 MG tablet Take 10 mg by mouth daily.     Marland Kitchen tiotropium (SPIRIVA) 18 MCG inhalation capsule Place 18 mcg into inhaler and inhale daily.     . verapamil (CALAN-SR) 240 MG CR tablet Take 1 tablet (240 mg total) by mouth 2 (two) times daily. 180 tablet 4  . warfarin (COUMADIN) 1 MG tablet   3  . warfarin (COUMADIN) 5 MG tablet   3  . warfarin (COUMADIN) 7.5 MG tablet   3   No current facility-administered medications for this visit.   Allergies as of 05/02/2014 - Review Complete 05/02/2014  Allergen Reaction Noted  . Codeine      reports that he quit smoking about 18 years ago. He does not have any smokeless tobacco history on file. His alcohol and drug histories are not on file.   Review of Systems: Pertinent positive and negative review of systems were noted in the above HPI section. All other  review of systems were otherwise negative.  Vital signs were reviewed in today's medical record Physical Exam: General: Obese male in no acute distress Skin: anicteric Head: Normocephalic and atraumatic Eyes: sclerae anicteric, EOMI Ears: Normal auditory acuity Mouth: No deformity or lesions Neck: Supple, no masses or thyromegaly Lungs: Clear throughout to auscultation Heart: Regular rate and  rhythm; no murmurs, rubs or bruits Abdomen: Soft, non tender and non distended. No masses, hepatosplenomegaly or hernias noted. Normal Bowel sounds Rectal:deferred Musculoskeletal: Symmetrical with no gross deformities  Skin: No lesions on visible extremities Pulses: Normal pulses noted Extremities: No clubbing, cyanosis, edema or deformities noted Neurological: Alert oriented x 4, grossly nonfocal Cervical Nodes: No significant cervical adenopathy Inguinal Nodes: No significant inguinal adenopathy Psychological: Alert and cooperative. Normal mood and affect  See Assessment and Plan under Problem List                      Status: Written Related Problem: History of iron deficiency anemia   Expand All Collapse All   Patient has history of recent iron deficiency anemia. He has had no overt GI bleeding and offers no GI complaints. A Cologuard test was positive raising the question of a colonic advanced neoplasm.  Recommendations #1 colonoscopy and, if negative, upper endoscopy. Because of the patient's comorbidities this test will be done at the hospital endoscopy suite. Whether or not Coumadin can be held will be decided by his PCP.  The risk of holding anticoagulation therapy or antiplatelet medications was discussed including the increased risk for thromboembolic disease that may include DVT, pulmonary emboli and stroke. The patient understands this risk and is willing to proceed with temporally holding the medication provided that this is approved by her PCP or cardiologist.                 Status: Written Related Problem: Acute pulmonary embolism   Expand All Collapse All   Diagnosed in October, 2015 and placed on Coumadin.

## 2014-06-11 NOTE — Transfer of Care (Signed)
Immediate Anesthesia Transfer of Care Note  Patient: Chad Hogan  Procedure(s) Performed: Procedure(s): COLONOSCOPY WITH PROPOFOL (N/A) ESOPHAGOGASTRODUODENOSCOPY (EGD) WITH PROPOFOL (N/A)  Patient Location: PACU and Endoscopy Unit  Anesthesia Type:MAC  Level of Consciousness: awake, alert  and patient cooperative  Airway & Oxygen Therapy: Patient Spontanous Breathing and Patient connected to face mask oxygen  Post-op Assessment: Report given to RN and Post -op Vital signs reviewed and stable  Post vital signs: Reviewed and stable  Last Vitals:  Filed Vitals:   06/11/14 0924  BP: 176/78  Pulse: 67  Temp: 36.7 C  Resp: 20    Complications: No apparent anesthesia complications

## 2014-06-12 ENCOUNTER — Encounter (HOSPITAL_COMMUNITY): Payer: Self-pay | Admitting: Gastroenterology

## 2014-06-13 ENCOUNTER — Encounter: Payer: Self-pay | Admitting: Gastroenterology

## 2014-06-13 LAB — GLUCOSE, CAPILLARY: Glucose-Capillary: 102 mg/dL — ABNORMAL HIGH (ref 70–99)

## 2014-06-18 DIAGNOSIS — Z7901 Long term (current) use of anticoagulants: Secondary | ICD-10-CM | POA: Diagnosis not present

## 2014-06-19 ENCOUNTER — Telehealth: Payer: Self-pay | Admitting: Gastroenterology

## 2014-06-19 NOTE — Telephone Encounter (Signed)
Patient was seen for iron def anemia. Endoscopies were unrevealing. He understands his biopsy results. Last CBC was normal. His PCP has stopped the iron supplement. He is questioning the need to be seen again. Please advise.

## 2014-07-02 ENCOUNTER — Other Ambulatory Visit: Payer: Self-pay

## 2014-07-02 MED ORDER — VERAPAMIL HCL ER 240 MG PO TBCR: 240.0000 mg | EXTENDED_RELEASE_TABLET | Freq: Two times a day (BID) | ORAL | Status: DC

## 2014-07-03 DIAGNOSIS — Z7901 Long term (current) use of anticoagulants: Secondary | ICD-10-CM | POA: Diagnosis not present

## 2014-07-08 DIAGNOSIS — G4733 Obstructive sleep apnea (adult) (pediatric): Secondary | ICD-10-CM | POA: Diagnosis not present

## 2014-07-30 DIAGNOSIS — Z86711 Personal history of pulmonary embolism: Secondary | ICD-10-CM | POA: Diagnosis not present

## 2014-07-30 DIAGNOSIS — Z86010 Personal history of colonic polyps: Secondary | ICD-10-CM | POA: Diagnosis not present

## 2014-07-30 DIAGNOSIS — Z9181 History of falling: Secondary | ICD-10-CM | POA: Diagnosis not present

## 2014-07-30 DIAGNOSIS — K279 Peptic ulcer, site unspecified, unspecified as acute or chronic, without hemorrhage or perforation: Secondary | ICD-10-CM | POA: Diagnosis not present

## 2014-07-30 DIAGNOSIS — Z0000 Encounter for general adult medical examination without abnormal findings: Secondary | ICD-10-CM | POA: Diagnosis not present

## 2014-07-30 DIAGNOSIS — Z125 Encounter for screening for malignant neoplasm of prostate: Secondary | ICD-10-CM | POA: Diagnosis not present

## 2014-07-30 DIAGNOSIS — E782 Mixed hyperlipidemia: Secondary | ICD-10-CM | POA: Diagnosis not present

## 2014-07-30 DIAGNOSIS — Z79899 Other long term (current) drug therapy: Secondary | ICD-10-CM | POA: Diagnosis not present

## 2014-08-06 DIAGNOSIS — Z86718 Personal history of other venous thrombosis and embolism: Secondary | ICD-10-CM | POA: Diagnosis not present

## 2014-08-08 DIAGNOSIS — G4733 Obstructive sleep apnea (adult) (pediatric): Secondary | ICD-10-CM | POA: Diagnosis not present

## 2014-08-10 DIAGNOSIS — Z0100 Encounter for examination of eyes and vision without abnormal findings: Secondary | ICD-10-CM | POA: Diagnosis not present

## 2014-08-10 DIAGNOSIS — H25813 Combined forms of age-related cataract, bilateral: Secondary | ICD-10-CM | POA: Diagnosis not present

## 2014-09-07 DIAGNOSIS — G4733 Obstructive sleep apnea (adult) (pediatric): Secondary | ICD-10-CM | POA: Diagnosis not present

## 2014-09-11 ENCOUNTER — Encounter: Payer: Self-pay | Admitting: Internal Medicine

## 2014-09-11 ENCOUNTER — Ambulatory Visit (INDEPENDENT_AMBULATORY_CARE_PROVIDER_SITE_OTHER): Payer: Commercial Managed Care - HMO | Admitting: Internal Medicine

## 2014-09-11 VITALS — BP 128/72 | HR 55 | Ht 71.0 in | Wt 291.6 lb

## 2014-09-11 DIAGNOSIS — I471 Supraventricular tachycardia: Secondary | ICD-10-CM

## 2014-09-11 NOTE — Patient Instructions (Addendum)
Medication Instructions:  Your physician recommends that you continue on your current medications as directed. Please refer to the Current Medication list given to you today.  Labwork: None ordered  Testing/Procedures: None ordered  Follow-Up:  Your physician wants you to follow-up in: 9 months with Dr. Caryl Comes.  You will receive a reminder letter in the mail two months in advance. If you don't receive a letter, please call our office to schedule the follow-up appointment.   Thank you for choosing [facility]!!

## 2014-09-11 NOTE — Progress Notes (Signed)
Patient Care Team: Myer Peer, MD as PCP - General (Unknown Physician Specialty)   HPI  Chad Hogan is a 74 y.o. male Seen in followup for SVT  he has had no significant palpitations.  He was identified recently as having pulmonary emboli is currently on apixoban.    He has stable sob but no chest pain  No edema His hemoglobin A1c has gone from 5--6.1. He continues to not exercise. He continues to gain weight     Past Medical History  Diagnosis Date  . COPD (chronic obstructive pulmonary disease)   . SOB (shortness of breath)   . OSA (obstructive sleep apnea)   . HLD (hyperlipidemia)   . WPW (Wolff-Parkinson-White syndrome)   . Ischemic heart disease   . SVT (supraventricular tachycardia)   . PVC (premature ventricular contraction)   . Anxiety   . Obesity   . HTN (hypertension)   . Palpitation   . Umbilical hernia     Past Surgical History  Procedure Laterality Date  . Hernia repair    . Colonoscopy with propofol N/A 06/11/2014    Procedure: COLONOSCOPY WITH PROPOFOL;  Surgeon: Inda Castle, MD;  Location: [facility] ENDOSCOPY;  Service: Endoscopy;  Laterality: N/A;  . Esophagogastroduodenoscopy (egd) with propofol N/A 06/11/2014    Procedure: ESOPHAGOGASTRODUODENOSCOPY (EGD) WITH PROPOFOL;  Surgeon: Inda Castle, MD;  Location: [facility] ENDOSCOPY;  Service: Endoscopy;  Laterality: N/A;    Current Outpatient Prescriptions  Medication Sig Dispense Refill  . ALPRAZolam (XANAX) 0.5 MG tablet Take 0.5 mg by mouth 3 (three) times daily as needed for anxiety.     Marland Kitchen apixaban (ELIQUIS) 2.5 MG TABS tablet Take 2.5 mg by mouth 2 (two) times daily.    . Fluticasone-Salmeterol (ADVAIR DISKUS) 250-50 MCG/DOSE AEPB Inhale 1 puff into the lungs every 12 (twelve) hours.      . furosemide (LASIX) 40 MG tablet Take 40 mg by mouth daily.      Marland Kitchen losartan (COZAAR) 100 MG tablet Take 100 mg by mouth daily.     . metoprolol (LOPRESSOR) 50 MG tablet Take 50 mg by mouth 2 (two) times  daily.      . Omega-3 Fatty Acids (FISH OIL) 1200 MG CAPS Take 1 capsule by mouth daily.     Marland Kitchen omeprazole (PRILOSEC) 20 MG capsule Take 20 mg by mouth daily.   1  . rosuvastatin (CRESTOR) 10 MG tablet Take 10 mg by mouth at bedtime.     Marland Kitchen tiotropium (SPIRIVA) 18 MCG inhalation capsule Place 18 mcg into inhaler and inhale daily.      . verapamil (CALAN-SR) 240 MG CR tablet Take 1 tablet (240 mg total) by mouth 2 (two) times daily. 180 tablet 4  . vitamin C (ASCORBIC ACID) 500 MG tablet Take 500 mg by mouth 2 (two) times daily.     No current facility-administered medications for this visit.    Allergies  Allergen Reactions  . Codeine     Cant remember    Review of Systems negative except from HPI and PMH  Physical Exam BP 128/72 mmHg  Pulse 55  Ht 5\' 11"  (1.803 m)  Wt 291 lb 9.6 oz (132.269 kg)  BMI 40.69 kg/m2 Well developed and well nourished in no acute distress HENT normal E scleral and icterus clear Neck Supple JVP flat; carotids brisk and full Clear to ausculation  Regular rate and rhythm, no murmurs gallops or rub Soft with active bowel sounds No clubbing  cyanosis  Edema Alert and oriented, grossly normal motor and sensory function Skin Warm and Dry  ECG demonstrates sinus rhythm at 55 Intervals 21/10/45 Axis of -19 Assessment and  Plan  SVT with loss of preexcitation  Morbid obesity  Hyperglycemia/prediabetes  Hypertension     Outside laboratories were reviewed. Anemia has resolved.  No intercurrent arrhythmias.  Encouraged to exercise and lose weight

## 2014-09-12 DIAGNOSIS — J449 Chronic obstructive pulmonary disease, unspecified: Secondary | ICD-10-CM | POA: Diagnosis not present

## 2014-09-12 DIAGNOSIS — G4730 Sleep apnea, unspecified: Secondary | ICD-10-CM | POA: Diagnosis not present

## 2014-10-08 DIAGNOSIS — G4733 Obstructive sleep apnea (adult) (pediatric): Secondary | ICD-10-CM | POA: Diagnosis not present

## 2014-11-07 DIAGNOSIS — G4733 Obstructive sleep apnea (adult) (pediatric): Secondary | ICD-10-CM | POA: Diagnosis not present

## 2014-12-08 DIAGNOSIS — G4733 Obstructive sleep apnea (adult) (pediatric): Secondary | ICD-10-CM | POA: Diagnosis not present

## 2015-01-03 DIAGNOSIS — E782 Mixed hyperlipidemia: Secondary | ICD-10-CM | POA: Diagnosis not present

## 2015-01-03 DIAGNOSIS — Z23 Encounter for immunization: Secondary | ICD-10-CM | POA: Diagnosis not present

## 2015-01-03 DIAGNOSIS — Z86718 Personal history of other venous thrombosis and embolism: Secondary | ICD-10-CM | POA: Diagnosis not present

## 2015-01-03 DIAGNOSIS — Z760 Encounter for issue of repeat prescription: Secondary | ICD-10-CM | POA: Diagnosis not present

## 2015-01-03 DIAGNOSIS — I10 Essential (primary) hypertension: Secondary | ICD-10-CM | POA: Diagnosis not present

## 2015-01-03 DIAGNOSIS — E119 Type 2 diabetes mellitus without complications: Secondary | ICD-10-CM | POA: Diagnosis not present

## 2015-01-08 DIAGNOSIS — G4733 Obstructive sleep apnea (adult) (pediatric): Secondary | ICD-10-CM | POA: Diagnosis not present

## 2015-01-19 DIAGNOSIS — H6121 Impacted cerumen, right ear: Secondary | ICD-10-CM | POA: Diagnosis not present

## 2015-01-19 DIAGNOSIS — H6122 Impacted cerumen, left ear: Secondary | ICD-10-CM | POA: Diagnosis not present

## 2015-01-30 DIAGNOSIS — J342 Deviated nasal septum: Secondary | ICD-10-CM | POA: Diagnosis not present

## 2015-01-30 DIAGNOSIS — H9313 Tinnitus, bilateral: Secondary | ICD-10-CM | POA: Diagnosis not present

## 2015-01-30 DIAGNOSIS — H9193 Unspecified hearing loss, bilateral: Secondary | ICD-10-CM | POA: Diagnosis not present

## 2015-02-07 DIAGNOSIS — G4733 Obstructive sleep apnea (adult) (pediatric): Secondary | ICD-10-CM | POA: Diagnosis not present

## 2015-03-10 DIAGNOSIS — G4733 Obstructive sleep apnea (adult) (pediatric): Secondary | ICD-10-CM | POA: Diagnosis not present

## 2015-04-09 DIAGNOSIS — G4733 Obstructive sleep apnea (adult) (pediatric): Secondary | ICD-10-CM | POA: Diagnosis not present

## 2015-04-12 ENCOUNTER — Telehealth: Payer: Self-pay | Admitting: Gastroenterology

## 2015-04-12 DIAGNOSIS — K922 Gastrointestinal hemorrhage, unspecified: Secondary | ICD-10-CM | POA: Diagnosis not present

## 2015-04-12 NOTE — Telephone Encounter (Signed)
Patient reports that he had 4 episodes of bright red bleeding with a BM yesterday.  No bleeding today.  He is offered an appt with APP for next week, but he declines.  He has an appt today with his primary care and wants to see them prior to scheduling an office visit here. He will call back if he wants to schedule an office visit.

## 2015-05-08 DIAGNOSIS — G4733 Obstructive sleep apnea (adult) (pediatric): Secondary | ICD-10-CM | POA: Diagnosis not present

## 2015-05-10 DIAGNOSIS — G4733 Obstructive sleep apnea (adult) (pediatric): Secondary | ICD-10-CM | POA: Diagnosis not present

## 2015-06-10 DIAGNOSIS — G4733 Obstructive sleep apnea (adult) (pediatric): Secondary | ICD-10-CM | POA: Diagnosis not present

## 2015-07-08 DIAGNOSIS — G4733 Obstructive sleep apnea (adult) (pediatric): Secondary | ICD-10-CM | POA: Diagnosis not present

## 2015-07-31 DIAGNOSIS — D649 Anemia, unspecified: Secondary | ICD-10-CM | POA: Diagnosis not present

## 2015-07-31 DIAGNOSIS — D510 Vitamin B12 deficiency anemia due to intrinsic factor deficiency: Secondary | ICD-10-CM | POA: Diagnosis not present

## 2015-07-31 DIAGNOSIS — E119 Type 2 diabetes mellitus without complications: Secondary | ICD-10-CM | POA: Diagnosis not present

## 2015-07-31 DIAGNOSIS — I10 Essential (primary) hypertension: Secondary | ICD-10-CM | POA: Diagnosis not present

## 2015-07-31 DIAGNOSIS — Z0000 Encounter for general adult medical examination without abnormal findings: Secondary | ICD-10-CM | POA: Diagnosis not present

## 2015-07-31 DIAGNOSIS — Z125 Encounter for screening for malignant neoplasm of prostate: Secondary | ICD-10-CM | POA: Diagnosis not present

## 2015-07-31 DIAGNOSIS — E782 Mixed hyperlipidemia: Secondary | ICD-10-CM | POA: Diagnosis not present

## 2015-08-08 DIAGNOSIS — G4733 Obstructive sleep apnea (adult) (pediatric): Secondary | ICD-10-CM | POA: Diagnosis not present

## 2015-08-20 ENCOUNTER — Ambulatory Visit: Payer: Commercial Managed Care - HMO | Admitting: Gastroenterology

## 2015-09-07 DIAGNOSIS — G4733 Obstructive sleep apnea (adult) (pediatric): Secondary | ICD-10-CM | POA: Diagnosis not present

## 2015-09-19 ENCOUNTER — Encounter: Payer: Self-pay | Admitting: Internal Medicine

## 2015-09-19 ENCOUNTER — Ambulatory Visit (INDEPENDENT_AMBULATORY_CARE_PROVIDER_SITE_OTHER): Payer: Commercial Managed Care - HMO | Admitting: Internal Medicine

## 2015-09-19 VITALS — BP 124/62 | HR 53 | Ht 71.0 in | Wt 287.2 lb

## 2015-09-19 DIAGNOSIS — I471 Supraventricular tachycardia: Secondary | ICD-10-CM

## 2015-09-19 MED ORDER — VERAPAMIL HCL ER 240 MG PO TBCR: 240.0000 mg | EXTENDED_RELEASE_TABLET | Freq: Two times a day (BID) | ORAL | Status: DC

## 2015-09-19 NOTE — Progress Notes (Signed)
Patient Care Team: Myer Peer, MD as PCP - General (Unknown Physician Specialty)   HPI  Chad Hogan is a 75 y.o. male Seen in followup for SVT  he has had no significant palpitations. He has a remote history of ventricular preexcitation  He was diagnosed withpulmonary emboli; he previously had been on ELIQUIS  He has stable sob but no chest pain  No edema  He sits at home. He has no desire to do anything x watch TV  His hemoglobin A1c has gone from 5--6.1. He continu es to not exercise. He continues to gain weight     Past Medical History  Diagnosis Date  . COPD (chronic obstructive pulmonary disease) ([place])   . SOB (shortness of breath)   . OSA (obstructive sleep apnea)   . HLD (hyperlipidemia)   . WPW (Wolff-Parkinson-White syndrome)   . Ischemic heart disease   . SVT (supraventricular tachycardia) ([place])   . PVC (premature ventricular contraction)   . Anxiety   . Obesity   . HTN (hypertension)   . Palpitation   . Umbilical hernia     Past Surgical History  Procedure Laterality Date  . Hernia repair    . Colonoscopy with propofol N/A 06/11/2014    Procedure: COLONOSCOPY WITH PROPOFOL;  Surgeon: Inda Castle, MD;  Location: [facility];  Service: Endoscopy;  Laterality: N/A;  . Esophagogastroduodenoscopy (egd) with propofol N/A 06/11/2014    Procedure: ESOPHAGOGASTRODUODENOSCOPY (EGD) WITH PROPOFOL;  Surgeon: Inda Castle, MD;  Location: [facility];  Service: Endoscopy;  Laterality: N/A;    Current Outpatient Prescriptions  Medication Sig Dispense Refill  . ALPRAZolam (XANAX) 0.5 MG tablet Take 0.5 mg by mouth 3 (three) times daily as needed for anxiety.     . ferrous sulfate 325 (65 FE) MG tablet Take 1 tablet by mouth daily.  3  . Fluticasone-Salmeterol (ADVAIR DISKUS) 250-50 MCG/DOSE AEPB Inhale 1 puff into the lungs every 12 (twelve) hours.      . furosemide (LASIX) 40 MG tablet Take 40 mg by mouth 2 (two) times daily.     Marland Kitchen losartan  (COZAAR) 100 MG tablet Take 100 mg by mouth daily.     . metoprolol (LOPRESSOR) 50 MG tablet Take 50 mg by mouth 2 (two) times daily.      Marland Kitchen omeprazole (PRILOSEC) 20 MG capsule Take 20 mg by mouth daily.   1  . rosuvastatin (CRESTOR) 10 MG tablet Take 10 mg by mouth at bedtime.     Marland Kitchen tiotropium (SPIRIVA) 18 MCG inhalation capsule Place 18 mcg into inhaler and inhale daily.      . verapamil (CALAN-SR) 240 MG CR tablet Take 1 tablet (240 mg total) by mouth 2 (two) times daily. 180 tablet 4  . vitamin B-12 (CYANOCOBALAMIN) 500 MCG tablet Take 500 mcg by mouth daily.    . vitamin C (ASCORBIC ACID) 500 MG tablet Take 500 mg by mouth daily.      No current facility-administered medications for this visit.    Allergies  Allergen Reactions  . Codeine     Cant remember    Review of Systems negative except from HPI and PMH  Physical Exam BP 124/62 mmHg  Pulse 53  Ht 5\' 11"  (1.803 m)  Wt 287 lb 3.2 oz (130.273 kg)  BMI 40.07 kg/m2  SpO2 86% Well developed and well nourished in no acute distress HENT normal E scleral and icterus clear Neck Supple JVP flat; carotids brisk  and full Clear to ausculation  Regular rate and rhythm, no murmurs gallops or rub Soft with active bowel sounds No clubbing cyanosis  Edema Alert and oriented, grossly normal motor and sensory function Skin Warm and Dry  ECG demonstrates sinus rhythm at 55 Intervals 21/12/45 Axis of -19 Assessment and  Plan  SVT with loss of preexcitation    Morbid obesity  Hyperglycemia/prediabetes  Hypertension  Thank you Outside laboratories were reviewed. Anemia has resolved. SGPT was minimally elevated  No intercurrent arrhythmias.  Encouraged to exercise and lose weight   Asked him to follow-up with his PCP regarding depression

## 2015-09-19 NOTE — Patient Instructions (Signed)

## 2015-10-08 DIAGNOSIS — G4733 Obstructive sleep apnea (adult) (pediatric): Secondary | ICD-10-CM | POA: Diagnosis not present

## 2015-10-30 DIAGNOSIS — Z1389 Encounter for screening for other disorder: Secondary | ICD-10-CM | POA: Diagnosis not present

## 2015-10-30 DIAGNOSIS — F410 Panic disorder [episodic paroxysmal anxiety] without agoraphobia: Secondary | ICD-10-CM | POA: Diagnosis not present

## 2015-10-30 DIAGNOSIS — Z9181 History of falling: Secondary | ICD-10-CM | POA: Diagnosis not present

## 2015-11-07 DIAGNOSIS — G4733 Obstructive sleep apnea (adult) (pediatric): Secondary | ICD-10-CM | POA: Diagnosis not present

## 2015-11-11 DIAGNOSIS — L0100 Impetigo, unspecified: Secondary | ICD-10-CM | POA: Diagnosis not present

## 2015-11-11 DIAGNOSIS — R21 Rash and other nonspecific skin eruption: Secondary | ICD-10-CM | POA: Diagnosis not present

## 2015-12-08 DIAGNOSIS — G4733 Obstructive sleep apnea (adult) (pediatric): Secondary | ICD-10-CM | POA: Diagnosis not present

## 2015-12-13 ENCOUNTER — Telehealth: Payer: Self-pay | Admitting: Internal Medicine

## 2015-12-13 MED ORDER — VERAPAMIL HCL ER 240 MG PO TBCR: 240.0000 mg | EXTENDED_RELEASE_TABLET | Freq: Two times a day (BID) | ORAL | 1 refills | Status: DC

## 2015-12-13 NOTE — Telephone Encounter (Signed)
PT WANTED  SCRIPT SENT TO   [facility]  SO  MAY GET  ORIGINAL VERAPAMIL  FROM SAME  CO PER PT  WILL PAY OUT  OF  POCKET  DOES NOT  FEEL  THAT  NEW MANUFACTURE MED  IS HAVING  THE SAME RESULTS  . NEW  SCRIPT  SENT  AS PT'S REQUEST .Adonis Housekeeper

## 2015-12-13 NOTE — Telephone Encounter (Signed)
New message     Pt calling about his meds. Pt states he takes Verapamil and [employer] has changed company's and the med does not seem to be doing what it normally does. Pt wants a new rx.

## 2015-12-17 ENCOUNTER — Other Ambulatory Visit: Payer: Self-pay | Admitting: *Deleted

## 2015-12-17 MED ORDER — VERAPAMIL HCL ER 240 MG PO TBCR: 240.0000 mg | EXTENDED_RELEASE_TABLET | Freq: Two times a day (BID) | ORAL | 8 refills | Status: DC

## 2016-01-08 DIAGNOSIS — G4733 Obstructive sleep apnea (adult) (pediatric): Secondary | ICD-10-CM | POA: Diagnosis not present

## 2016-01-30 DIAGNOSIS — D509 Iron deficiency anemia, unspecified: Secondary | ICD-10-CM | POA: Diagnosis not present

## 2016-01-30 DIAGNOSIS — Z760 Encounter for issue of repeat prescription: Secondary | ICD-10-CM | POA: Diagnosis not present

## 2016-01-30 DIAGNOSIS — Z79899 Other long term (current) drug therapy: Secondary | ICD-10-CM | POA: Diagnosis not present

## 2016-01-30 DIAGNOSIS — K922 Gastrointestinal hemorrhage, unspecified: Secondary | ICD-10-CM | POA: Diagnosis not present

## 2016-01-30 DIAGNOSIS — E119 Type 2 diabetes mellitus without complications: Secondary | ICD-10-CM | POA: Diagnosis not present

## 2016-01-30 DIAGNOSIS — Z125 Encounter for screening for malignant neoplasm of prostate: Secondary | ICD-10-CM | POA: Diagnosis not present

## 2016-01-30 DIAGNOSIS — Z23 Encounter for immunization: Secondary | ICD-10-CM | POA: Diagnosis not present

## 2016-02-07 DIAGNOSIS — G4733 Obstructive sleep apnea (adult) (pediatric): Secondary | ICD-10-CM | POA: Diagnosis not present

## 2016-03-09 DIAGNOSIS — G4733 Obstructive sleep apnea (adult) (pediatric): Secondary | ICD-10-CM | POA: Diagnosis not present

## 2016-03-11 ENCOUNTER — Inpatient Hospital Stay (HOSPITAL_COMMUNITY)
Admission: EM | Admit: 2016-03-11 | Discharge: 2016-03-17 | DRG: 287 | Disposition: A | Discharge status: Home / Self Care | Payer: Commercial Managed Care - HMO | Attending: Cardiovascular Disease | Admitting: Cardiovascular Disease

## 2016-03-11 ENCOUNTER — Emergency Department (HOSPITAL_COMMUNITY): Payer: Commercial Managed Care - HMO

## 2016-03-11 ENCOUNTER — Encounter (HOSPITAL_COMMUNITY): Payer: Self-pay | Admitting: Nurse Practitioner

## 2016-03-11 DIAGNOSIS — R0602 Shortness of breath: Secondary | ICD-10-CM | POA: Diagnosis not present

## 2016-03-11 DIAGNOSIS — R748 Abnormal levels of other serum enzymes: Secondary | ICD-10-CM | POA: Diagnosis not present

## 2016-03-11 DIAGNOSIS — I471 Supraventricular tachycardia: Secondary | ICD-10-CM | POA: Diagnosis present

## 2016-03-11 DIAGNOSIS — I110 Hypertensive heart disease with heart failure: Secondary | ICD-10-CM | POA: Diagnosis present

## 2016-03-11 DIAGNOSIS — N179 Acute kidney failure, unspecified: Secondary | ICD-10-CM | POA: Diagnosis not present

## 2016-03-11 DIAGNOSIS — I5032 Chronic diastolic (congestive) heart failure: Secondary | ICD-10-CM | POA: Diagnosis not present

## 2016-03-11 DIAGNOSIS — I472 Ventricular tachycardia, unspecified: Secondary | ICD-10-CM

## 2016-03-11 DIAGNOSIS — G4733 Obstructive sleep apnea (adult) (pediatric): Secondary | ICD-10-CM | POA: Diagnosis present

## 2016-03-11 DIAGNOSIS — K921 Melena: Secondary | ICD-10-CM | POA: Diagnosis not present

## 2016-03-11 DIAGNOSIS — R7989 Other specified abnormal findings of blood chemistry: Secondary | ICD-10-CM

## 2016-03-11 DIAGNOSIS — Z87891 Personal history of nicotine dependence: Secondary | ICD-10-CM

## 2016-03-11 DIAGNOSIS — I959 Hypotension, unspecified: Secondary | ICD-10-CM | POA: Diagnosis not present

## 2016-03-11 DIAGNOSIS — Z6841 Body Mass Index (BMI) 40.0 and over, adult: Secondary | ICD-10-CM

## 2016-03-11 DIAGNOSIS — Z7951 Long term (current) use of inhaled steroids: Secondary | ICD-10-CM | POA: Diagnosis not present

## 2016-03-11 DIAGNOSIS — R001 Bradycardia, unspecified: Secondary | ICD-10-CM | POA: Diagnosis present

## 2016-03-11 DIAGNOSIS — Z79899 Other long term (current) drug therapy: Secondary | ICD-10-CM

## 2016-03-11 DIAGNOSIS — R069 Unspecified abnormalities of breathing: Secondary | ICD-10-CM | POA: Diagnosis not present

## 2016-03-11 DIAGNOSIS — J449 Chronic obstructive pulmonary disease, unspecified: Secondary | ICD-10-CM | POA: Diagnosis present

## 2016-03-11 DIAGNOSIS — E785 Hyperlipidemia, unspecified: Secondary | ICD-10-CM | POA: Diagnosis present

## 2016-03-11 DIAGNOSIS — Z86711 Personal history of pulmonary embolism: Secondary | ICD-10-CM

## 2016-03-11 DIAGNOSIS — K922 Gastrointestinal hemorrhage, unspecified: Secondary | ICD-10-CM | POA: Diagnosis not present

## 2016-03-11 DIAGNOSIS — K649 Unspecified hemorrhoids: Secondary | ICD-10-CM | POA: Diagnosis present

## 2016-03-11 DIAGNOSIS — I456 Pre-excitation syndrome: Secondary | ICD-10-CM | POA: Diagnosis present

## 2016-03-11 DIAGNOSIS — I248 Other forms of acute ischemic heart disease: Secondary | ICD-10-CM | POA: Diagnosis not present

## 2016-03-11 DIAGNOSIS — E872 Acidosis: Secondary | ICD-10-CM | POA: Diagnosis not present

## 2016-03-11 DIAGNOSIS — E875 Hyperkalemia: Secondary | ICD-10-CM | POA: Diagnosis not present

## 2016-03-11 DIAGNOSIS — R740 Nonspecific elevation of levels of transaminase and lactic acid dehydrogenase [LDH]: Secondary | ICD-10-CM | POA: Diagnosis present

## 2016-03-11 DIAGNOSIS — K5730 Diverticulosis of large intestine without perforation or abscess without bleeding: Secondary | ICD-10-CM | POA: Diagnosis not present

## 2016-03-11 LAB — COMPREHENSIVE METABOLIC PANEL
ALK PHOS: 140 U/L — AB (ref 38–126)
ALT: 145 U/L — AB (ref 17–63)
ANION GAP: 13 (ref 5–15)
AST: 137 U/L — ABNORMAL HIGH (ref 15–41)
Albumin: 3.2 g/dL — ABNORMAL LOW (ref 3.5–5.0)
BILIRUBIN TOTAL: 1.8 mg/dL — AB (ref 0.3–1.2)
BUN: 26 mg/dL — ABNORMAL HIGH (ref 6–20)
CALCIUM: 8.1 mg/dL — AB (ref 8.9–10.3)
CO2: 15 mmol/L — ABNORMAL LOW (ref 22–32)
CREATININE: 2.45 mg/dL — AB (ref 0.61–1.24)
Chloride: 110 mmol/L (ref 101–111)
GFR calc non Af Amer: 24 mL/min — ABNORMAL LOW (ref >60)
GFR, EST AFRICAN AMERICAN: 28 mL/min — AB (ref >60)
Glucose, Bld: 140 mg/dL — ABNORMAL HIGH (ref 65–99)
Potassium: 6.9 mmol/L (ref 3.5–5.1)
Sodium: 138 mmol/L (ref 135–145)
TOTAL PROTEIN: 5.5 g/dL — AB (ref 6.5–8.1)

## 2016-03-11 LAB — CBC WITH DIFFERENTIAL/PLATELET
BASOS ABS: 0 10*3/uL (ref 0.0–0.1)
BASOS PCT: 0 %
EOS ABS: 0 10*3/uL (ref 0.0–0.7)
EOS PCT: 0 %
HCT: 47.4 % (ref 39.0–52.0)
Hemoglobin: 15.2 g/dL (ref 13.0–17.0)
Lymphocytes Relative: 17 %
Lymphs Abs: 2.3 10*3/uL (ref 0.7–4.0)
MCH: 29.6 pg (ref 26.0–34.0)
MCHC: 32.1 g/dL (ref 30.0–36.0)
MCV: 92.2 fL (ref 78.0–100.0)
MONO ABS: 0.6 10*3/uL (ref 0.1–1.0)
Monocytes Relative: 5 %
Neutro Abs: 10.4 10*3/uL — ABNORMAL HIGH (ref 1.7–7.7)
Neutrophils Relative %: 78 %
PLATELETS: 179 10*3/uL (ref 150–400)
RBC: 5.14 MIL/uL (ref 4.22–5.81)
RDW: 13.7 % (ref 11.5–15.5)
WBC: 13.3 10*3/uL — AB (ref 4.0–10.5)

## 2016-03-11 LAB — I-STAT TROPONIN, ED
TROPONIN I, POC: 0.13 ng/mL — AB (ref 0.00–0.08)
TROPONIN I, POC: 0.19 ng/mL — AB (ref 0.00–0.08)
Troponin i, poc: 0.22 ng/mL (ref 0.00–0.08)

## 2016-03-11 LAB — POTASSIUM: Potassium: 5.2 mmol/L — ABNORMAL HIGH (ref 3.5–5.1)

## 2016-03-11 LAB — I-STAT CG4 LACTIC ACID, ED
LACTIC ACID, VENOUS: 1.91 mmol/L — AB (ref 0.5–1.9)
Lactic Acid, Venous: 6.82 mmol/L (ref 0.5–1.9)

## 2016-03-11 LAB — BRAIN NATRIURETIC PEPTIDE: B NATRIURETIC PEPTIDE 5: 668.9 pg/mL — AB (ref 0.0–100.0)

## 2016-03-11 LAB — D-DIMER, QUANTITATIVE: D-Dimer, Quant: 1.18 ug/mL-FEU — ABNORMAL HIGH (ref 0.00–0.50)

## 2016-03-11 MED ORDER — SODIUM CHLORIDE 0.9 % IV BOLUS (SEPSIS)
1000.0000 mL | Freq: Once | INTRAVENOUS | Status: AC
  Administered 2016-03-11: 1000 mL via INTRAVENOUS

## 2016-03-11 MED ORDER — ROSUVASTATIN CALCIUM 10 MG PO TABS
10.0000 mg | ORAL_TABLET | Freq: Every day | ORAL | Status: DC
Start: 2016-03-11 — End: 2016-03-17
  Administered 2016-03-12 – 2016-03-16 (×6): 10 mg via ORAL
  Filled 2016-03-11 (×6): qty 1

## 2016-03-11 MED ORDER — NITROGLYCERIN 0.4 MG SL SUBL: 0.4000 mg | SUBLINGUAL_TABLET | SUBLINGUAL | Status: DC | PRN

## 2016-03-11 MED ORDER — PANTOPRAZOLE SODIUM 40 MG PO TBEC
40.0000 mg | DELAYED_RELEASE_TABLET | Freq: Every day | ORAL | Status: DC
  Administered 2016-03-12 – 2016-03-17 (×5): 40 mg via ORAL
  Filled 2016-03-11 (×6): qty 1

## 2016-03-11 MED ORDER — ACETAMINOPHEN 325 MG PO TABS: 650.0000 mg | ORAL_TABLET | ORAL | Status: DC | PRN

## 2016-03-11 MED ORDER — VITAMIN C 500 MG PO TABS
500.0000 mg | ORAL_TABLET | Freq: Every day | ORAL | Status: DC
  Administered 2016-03-12 – 2016-03-17 (×5): 500 mg via ORAL
  Filled 2016-03-11 (×6): qty 1

## 2016-03-11 MED ORDER — HEPARIN (PORCINE) IN NACL 100-0.45 UNIT/ML-% IJ SOLN
1450.0000 [IU]/h | INTRAMUSCULAR | Status: DC
  Filled 2016-03-11: qty 250

## 2016-03-11 MED ORDER — TIOTROPIUM BROMIDE MONOHYDRATE 18 MCG IN CAPS
18.0000 ug | ORAL_CAPSULE | Freq: Every day | RESPIRATORY_TRACT | Status: DC
  Administered 2016-03-15 – 2016-03-17 (×3): 18 ug via RESPIRATORY_TRACT
  Filled 2016-03-11 (×3): qty 5

## 2016-03-11 MED ORDER — HEPARIN BOLUS VIA INFUSION
4000.0000 [IU] | Freq: Once | INTRAVENOUS | Status: DC
  Filled 2016-03-11: qty 4000

## 2016-03-11 MED ORDER — ALPRAZOLAM 0.5 MG PO TABS
0.5000 mg | ORAL_TABLET | Freq: Three times a day (TID) | ORAL | Status: DC | PRN
  Administered 2016-03-12 – 2016-03-17 (×9): 0.5 mg via ORAL
  Filled 2016-03-11 (×4): qty 1
  Filled 2016-03-11: qty 2
  Filled 2016-03-11 (×4): qty 1

## 2016-03-11 MED ORDER — ASPIRIN EC 81 MG PO TBEC
81.0000 mg | DELAYED_RELEASE_TABLET | Freq: Every day | ORAL | Status: DC
  Administered 2016-03-12 – 2016-03-15 (×4): 81 mg via ORAL
  Filled 2016-03-11 (×4): qty 1

## 2016-03-11 MED ORDER — FERROUS SULFATE 325 (65 FE) MG PO TABS
325.0000 mg | ORAL_TABLET | Freq: Every day | ORAL | Status: DC
  Administered 2016-03-12 – 2016-03-17 (×5): 325 mg via ORAL
  Filled 2016-03-11 (×6): qty 1

## 2016-03-11 MED ORDER — VITAMIN B-12 1000 MCG PO TABS
500.0000 ug | ORAL_TABLET | Freq: Every day | ORAL | Status: DC
  Administered 2016-03-12 – 2016-03-17 (×5): 500 ug via ORAL
  Filled 2016-03-11 (×6): qty 1

## 2016-03-11 MED ORDER — ONDANSETRON HCL 4 MG/2ML IJ SOLN: 4.0000 mg | Freq: Four times a day (QID) | INTRAMUSCULAR | Status: DC | PRN

## 2016-03-11 NOTE — ED Notes (Signed)
Pt talks easily and is alert and oriented x 4 maew to command. Daughter at bedside. BP remain soft with SB on monitor. Pt denies discomfort, SHOB.

## 2016-03-11 NOTE — ED Triage Notes (Signed)
Per [facility] pt from home felt a flutter in his chest and felt "weird feeling in his heart", fluttering resolved but patient never fully felt better. Around 3 patient felt significantly worse, short of breath and diaphoretic.   Upon EMS arrival patient was mentating appropriately but unable to get BP and palpate radial pulses. Unable to get IV access- Pt noted to be in V-tach Cardioversion at 200J  Pt returned to sinus bradycardia with a rate of 46bpm  Pt received 324 ASA and established a 20 G in Left Ac and 22 in Left hand- 1,010mL NS running   After cardioversion pt ShOB resolved.

## 2016-03-11 NOTE — ED Notes (Signed)
2 liters NS w/o per VO dr. Gustavus Messing.

## 2016-03-11 NOTE — Consult Note (Deleted)
Patient ID: Chad Hogan MRN: [id number], DOB/AGE: 1940-11-25   Admit date: 03/11/2016   Primary Physician: Ann Held, MD Primary Cardiologist: Dr. Caryl Comes  Pt. Profile:  Chad Hogan is a super morbidly obese 75 year old Caucasian male with past medical history of SVT in the context of WPW, history of PE in February 2016 finished a course of eliquis, COPD, obstructive sleep apnea, hypertension, and hyperlipidemia presented with VT underwent emergent cardioversion  Problem List  Past Medical History:  Diagnosis Date  . Anxiety   . COPD (chronic obstructive pulmonary disease) ([place])   . HLD (hyperlipidemia)   . HTN (hypertension)   . Ischemic heart disease   . Obesity   . OSA (obstructive sleep apnea)   . Palpitation   . PVC (premature ventricular contraction)   . SOB (shortness of breath)   . SVT (supraventricular tachycardia) ([place])   . Umbilical hernia   . WPW (Wolff-Parkinson-White syndrome)     Past Surgical History:  Procedure Laterality Date  . COLONOSCOPY WITH PROPOFOL N/A 06/11/2014   Procedure: COLONOSCOPY WITH PROPOFOL;  Surgeon: Inda Castle, MD;  Location: [facility];  Service: Endoscopy;  Laterality: N/A;  . ESOPHAGOGASTRODUODENOSCOPY (EGD) WITH PROPOFOL N/A 06/11/2014   Procedure: ESOPHAGOGASTRODUODENOSCOPY (EGD) WITH PROPOFOL;  Surgeon: Inda Castle, MD;  Location: [facility];  Service: Endoscopy;  Laterality: N/A;  . HERNIA REPAIR       Allergies  Allergies  Allergen Reactions  . Codeine     Cant remember    HPI  Chad Hogan is a super morbidly obese 75 year old Caucasian male with past medical history of SVT in the context of WPW, history of PE in February 2016 finished a course of eliquis, COPD, obstructive sleep apnea, hypertension, and hyperlipidemia. He used to be on both were no under detoxing, however there was evidence of antegrade conduction over accessory pathway, therefore his digoxin was discontinued. According to Dr. Olin Pia note, he  had a progressive loss of antegrade conduction over time. He had a Myoview a year in September 2006 that demonstrated a moderate sized inferior scar, no ischemia, EF 55%.  He has some issues earlier this year when his insurance changed the brand of his verapamil which he think is not controlling his heart rate as well. Otherwise he has been doing well at home. Unfortunately, he does not do any strenuous activities. He has no desire to do any activities either. According to the patient, the most strenuous activity he did in the last 2 weeks was to use the remote control to turn the TV channels. He was using his usual state of health until 1 AM in the morning of 03/11/2016 when he woke up with palpitation. He says that palpitation quickly went away and he no longer have any cardiac awareness symptoms afterward. As the day went on, he had progression of shortness of breath especially with exertion. He denies any dizziness or feeling of passing out initially, however as to shortness breath worsened, he feels like he was going to pass out. He eventually called EMS and was taken to [facility].   Initial significant blood work included a potassium of 6.9, however lab was hemolyzed, creatinine 2.45 W. previous creatinine was 0.9. Elevated AST 137, elevated ALT 145, BNP 668, troponin 0.13, lactic acid 6.82, white blood cell count 13.3. D-dimer was 1.18. Chest x-ray showed no acute abnormalities. His initial rhythm was in ventricular tachycardia, his initial blood pressure was in the 70s, he underwent emergent cardioversion in  the ED. He received 1 L of IV fluid by EMS, he received 2 more liter of IV fluid in the emergency room before his systolic blood pressure went above 100. Cardiology has been consulted for VT versus SVT with aberrancy.   Home Medications  Prior to Admission medications   Medication Sig Start Date End Date Taking? Authorizing Provider  ALPRAZolam Duanne Moron) 0.5 MG tablet Take 0.5 mg by  mouth 3 (three) times daily as needed for anxiety.     Historical Provider, MD  ferrous sulfate 325 (65 FE) MG tablet Take 1 tablet by mouth daily. 08/20/15   Historical Provider, MD  Fluticasone-Salmeterol (ADVAIR DISKUS) 250-50 MCG/DOSE AEPB Inhale 1 puff into the lungs every 12 (twelve) hours.      Historical Provider, MD  furosemide (LASIX) 40 MG tablet Take 40 mg by mouth 2 (two) times daily.     Historical Provider, MD  losartan (COZAAR) 100 MG tablet Take 100 mg by mouth daily.  12/10/11   Historical Provider, MD  metoprolol (LOPRESSOR) 50 MG tablet Take 50 mg by mouth 2 (two) times daily.      Historical Provider, MD  omeprazole (PRILOSEC) 20 MG capsule Take 20 mg by mouth daily.  04/10/14   Historical Provider, MD  rosuvastatin (CRESTOR) 10 MG tablet Take 10 mg by mouth at bedtime.     Historical Provider, MD  tiotropium (SPIRIVA) 18 MCG inhalation capsule Place 18 mcg into inhaler and inhale daily.      Historical Provider, MD  verapamil (CALAN-SR) 240 MG CR tablet Take 1 tablet (240 mg total) by mouth 2 (two) times daily. 12/17/15   Deboraha Sprang, MD  vitamin B-12 (CYANOCOBALAMIN) 500 MCG tablet Take 500 mcg by mouth daily.    Historical Provider, MD  vitamin C (ASCORBIC ACID) 500 MG tablet Take 500 mg by mouth daily.     Historical Provider, MD    Family History  Family History  Problem Relation Age of Onset  . Liver cancer    . Heart disease      Social History  Social History   Social History  . Marital status: Divorced    Spouse name: N/A  . Number of children: 2  . Years of education: N/A   Occupational History  . retired  Psychologist, prison and probation services   Social History Main Topics  . Smoking status: Former Smoker    Quit date: 04/13/1996  . Smokeless tobacco: Never Used  . Alcohol use No  . Drug use: No  . Sexual activity: Not Currently   Other Topics Concern  . Not on file   Social History Narrative  . No narrative on file     Review of Systems General:  No chills, fever,  night sweats or weight changes.  Cardiovascular:  No chest pain, dyspnea on exertion, edema, orthopnea, paroxysmal nocturnal dyspnea. +palpitations Dermatological: No rash, lesions/masses Respiratory: No cough, dyspnea Urologic: No hematuria, dysuria Abdominal:   No nausea, vomiting, diarrhea, bright red blood per rectum, melena, or hematemesis Neurologic:  No visual changes + wkns, changes in mental status. All other systems reviewed and are otherwise negative except as noted above.  Physical Exam  Blood pressure (!) 75/52, pulse (!) 43, temperature 97.6 F (36.4 C), temperature source Oral, resp. rate 21, height 5\' 11"  (1.803 m), weight 287 lb (130.2 kg), SpO2 91 %.  General: Pleasant, NAD Psych: Normal affect. Neuro: Alert and oriented X 3. Moves all extremities spontaneously. HEENT: Normal  Neck: Supple without bruits or JVD.  Lungs:  Resp regular and unlabored, CTA. Heart: RRR no s3, s4, or murmurs. Abdomen: Soft, non-tender, non-distended, BS + x 4.  Extremities: No clubbing, cyanosis or edema. DP/PT/Radials 2+ and equal bilaterally.  Labs  Troponin [facility] Test)  Recent Labs  03/11/16 1706  TROPIPOC 0.13*   No results for input(s): CKTOTAL, CKMB, TROPONINI in the last 72 hours. Lab Results  Component Value Date   WBC 13.3 (H) 03/11/2016   HGB 15.2 03/11/2016   HCT 47.4 03/11/2016   MCV 92.2 03/11/2016   PLT 179 03/11/2016    Recent Labs Lab 03/11/16 1600  NA 138  K 6.9*  CL 110  CO2 15*  BUN 26*  CREATININE 2.45*  CALCIUM 8.1*  PROT 5.5*  BILITOT 1.8*  ALKPHOS 140*  ALT 145*  AST 137*  GLUCOSE 140*   No results found for: CHOL, HDL, LDLCALC, TRIG Lab Results  Component Value Date   DDIMER 1.18 (H) 03/11/2016     Radiology/Studies  Dg Chest Portable 1 View  Result Date: 03/11/2016 CLINICAL DATA:  Flutter feeling in chest today, shortness of breath, diaphoresis, episode of ventricular tachycardia, atrial fibrillation, drop in blood  pressure, history COPD, hyperlipidemia, ischemic heart disease, a arrhythmias, hypertension EXAM: PORTABLE CHEST 1 VIEW COMPARISON:  Portable exam 1652 hours compared to 12/19/2013 FINDINGS: Enlargement of cardiac silhouette. Mediastinal contours and pulmonary vascularity normal. Bronchitic changes with scarring at lateral RIGHT lung base. No definite acute infiltrate, pleural effusion or pneumothorax. External pacing lead projects over chest. IMPRESSION: Chronic bronchitic changes and RIGHT basilar scarring. Enlargement of cardiac silhouette. No acute abnormalities. Electronically Signed   By: Lavonia Dana M.D.   On: 03/11/2016 17:11    ECG  Sinus bradycardia  Echocardiogram 01/04/2014      ASSESSMENT AND PLAN  1. Wide-complex tachycardia with severe hypotension: VT versus SVT with aberrancy  - Labs consistent with persistent hypotension for long period of time. Patient was having progressive symptoms for up to 16 hours before he sought medical attention.  - Will continue to hydrate the patient, serial troponin overnight, obtain echocardiogram. Hold rate control medication tonight due to soft blood pressure and restart tomorrow once blood pressure,. EP to follow-up in the morning.  2. Hyperkalemia: Unclear if factitious lab result, repeat potassium has been drawn.  3. Acute kidney injury likely due to hypoperfusion  4. Elevated transaminase also due to hypoperfusion  5. Elevated d-dimer: Patient is sedentary at home, history of PE in February 2016, unfortunately given acute kidney injury, unable to obtain CTA, will order VQ scan.  6  Signed, Almyra Deforest, PA-C 03/11/2016, 5:44 PM

## 2016-03-11 NOTE — ED Notes (Signed)
Pt advised I had requested a hospital bed for comfort.

## 2016-03-11 NOTE — ED Provider Notes (Addendum)
[facility] DEPT Provider Note   CSN: [id number] Arrival date & time: 03/11/16  1618     History   Chief Complaint Chief Complaint  Patient presents with  . Palpitations    HPI Chad Hogan is a 75 y.o. male.  The history is provided by the patient and the EMS personnel.  Shortness of Breath  This is a new problem. The problem occurs continuously.The current episode started 6 to 12 hours ago. The problem has been resolved. Pertinent negatives include no fever, no rhinorrhea, no sore throat, no ear pain, no cough, no sputum production, no chest pain, no vomiting, no abdominal pain and no rash. Associated symptoms comments: Nausea and diaphoresis. Precipitated by: none. Treatments tried: cardioversion. The treatment provided significant relief. He has had prior hospitalizations. He has had prior ED visits. He has had no prior ICU admissions. Associated medical issues include heart failure.    Past Medical History:  Diagnosis Date  . Anxiety   . COPD (chronic obstructive pulmonary disease) ([place])   . HLD (hyperlipidemia)   . HTN (hypertension)   . Ischemic heart disease   . Obesity   . OSA (obstructive sleep apnea)   . Palpitation   . PVC (premature ventricular contraction)   . SOB (shortness of breath)   . SVT (supraventricular tachycardia) ([place])   . Umbilical hernia   . WPW (Wolff-Parkinson-White syndrome)     Patient Active Problem List   Diagnosis Date Noted  . Bradycardia 03/11/2016  . Ventricular tachycardia ([place]) 03/11/2016  . Benign neoplasm of colon 06/11/2014  . Diverticulosis of colon without hemorrhage 06/11/2014  . Internal and external hemorrhoids without complication [id number]  . Duodenal mass 06/11/2014  . History of iron deficiency anemia 05/02/2014  . Acute pulmonary embolism ([place]) 05/02/2014  . PALPITATIONS 02/11/2010  . ATYPICAL DEPRESSIVE DISORDER 08/21/2008  . PULMONARY FIBROSIS 09/15/2007  . Chronic diastolic heart failure ([place]) 09/15/2007    . HYPERLIPIDEMIA 08/16/2007  . SLEEP APNEA 08/16/2007  . Morbid obesity ([place]) 08/15/2007  . ANXIETY 08/15/2007  . HYPERTENSION 08/15/2007  . ISCHEMIC HEART DISEASE 08/15/2007  . WOLFF (WOLFE)-PARKINSON-WHITE (WPW) SYNDROME 08/15/2007  . PREMATURE VENTRICULAR CONTRACTIONS 08/15/2007  . Chronic airway obstruction, not elsewhere classified 08/15/2007    Past Surgical History:  Procedure Laterality Date  . COLONOSCOPY WITH PROPOFOL N/A 06/11/2014   Procedure: COLONOSCOPY WITH PROPOFOL;  Surgeon: Inda Castle, MD;  Location: [facility] ENDOSCOPY;  Service: Endoscopy;  Laterality: N/A;  . ESOPHAGOGASTRODUODENOSCOPY (EGD) WITH PROPOFOL N/A 06/11/2014   Procedure: ESOPHAGOGASTRODUODENOSCOPY (EGD) WITH PROPOFOL;  Surgeon: Inda Castle, MD;  Location: [facility] ENDOSCOPY;  Service: Endoscopy;  Laterality: N/A;  . HERNIA REPAIR         Home Medications    Prior to Admission medications   Medication Sig Start Date End Date Taking? Authorizing Provider  ALPRAZolam Duanne Moron) 0.5 MG tablet Take 0.75 mg by mouth at bedtime.    Yes Historical Provider, MD  ferrous sulfate 325 (65 FE) MG tablet Take 1 tablet by mouth daily. 08/20/15  Yes Historical Provider, MD  Fluticasone-Salmeterol (ADVAIR DISKUS) 250-50 MCG/DOSE AEPB Inhale 1 puff into the lungs every 12 (twelve) hours.     Yes Historical Provider, MD  furosemide (LASIX) 40 MG tablet Take 40 mg by mouth 2 (two) times daily.    Yes Historical Provider, MD  losartan (COZAAR) 100 MG tablet Take 100 mg by mouth daily.  12/10/11  Yes Historical Provider, MD  Methylcobalamin (B-12) 5000 MCG TBDP Take 5,000 Units by mouth  daily.   Yes Historical Provider, MD  metoprolol (LOPRESSOR) 50 MG tablet Take 50 mg by mouth 2 (two) times daily.     Yes Historical Provider, MD  Multiple Vitamin (MULTIVITAMIN WITH MINERALS) TABS tablet Take 1 tablet by mouth daily.   Yes Historical Provider, MD  omeprazole (PRILOSEC) 20 MG capsule Take 20 mg by mouth daily.  04/10/14  Yes  Historical Provider, MD  OVER THE COUNTER MEDICATION Inhale 1 application into the lungs at bedtime. CPAP with O2   Yes Historical Provider, MD  rosuvastatin (CRESTOR) 10 MG tablet Take 10 mg by mouth at bedtime.    Yes Historical Provider, MD  tiotropium (SPIRIVA) 18 MCG inhalation capsule Place 18 mcg into inhaler and inhale daily.     Yes Historical Provider, MD  verapamil (CALAN-SR) 240 MG CR tablet Take 1 tablet (240 mg total) by mouth 2 (two) times daily. 12/17/15  Yes Deboraha Sprang, MD  vitamin C (ASCORBIC ACID) 500 MG tablet Take 500 mg by mouth daily.    Yes Historical Provider, MD    Family History Family History  Problem Relation Age of Onset  . Liver cancer    . Heart disease      Social History Social History  Substance Use Topics  . Smoking status: Former Smoker    Quit date: 04/13/1996  . Smokeless tobacco: Never Used  . Alcohol use No     Allergies   Eliquis [apixaban] and Codeine   Review of Systems Review of Systems  Constitutional: Positive for diaphoresis. Negative for chills and fever.  HENT: Negative for ear pain, rhinorrhea and sore throat.   Eyes: Negative for pain and visual disturbance.  Respiratory: Positive for shortness of breath. Negative for cough and sputum production.   Cardiovascular: Negative for chest pain and palpitations.  Gastrointestinal: Positive for nausea. Negative for abdominal pain and vomiting.  Genitourinary: Negative for dysuria and hematuria.  Musculoskeletal: Negative for arthralgias and back pain.  Skin: Negative for color change and rash.  Neurological: Negative for seizures and syncope.  All other systems reviewed and are negative.    Physical Exam Updated Vital Signs BP 117/55   Pulse 66   Temp 97.6 F (36.4 C) (Oral)   Resp 18   Ht 5\' 11"  (1.803 m)   Wt 130.2 kg   SpO2 94%   BMI 40.03 kg/m   Physical Exam  Constitutional: He is oriented to person, place, and time. He appears well-developed and  well-nourished.  HENT:  Head: Normocephalic and atraumatic.  Eyes: Conjunctivae are normal.  Neck: Neck supple.  Cardiovascular: An irregular rhythm present. Bradycardia present.   No murmur heard. Pulmonary/Chest: Effort normal. No tachypnea and no bradypnea. No respiratory distress. He has decreased breath sounds (diffuse).  Abdominal: Soft. There is no tenderness.  Musculoskeletal: He exhibits no edema.  Neurological: He is alert and oriented to person, place, and time. He has normal strength. No cranial nerve deficit or sensory deficit. GCS eye subscore is 4. GCS verbal subscore is 5. GCS motor subscore is 6.  Skin: Skin is warm and dry.  Psychiatric: He has a normal mood and affect.  Nursing note and vitals reviewed.    ED Treatments / Results  Labs (all labs ordered are listed, but only abnormal results are displayed) Labs Reviewed  CBC WITH DIFFERENTIAL/PLATELET - Abnormal; Notable for the following:       Result Value   WBC 13.3 (*)    Neutro Abs 10.4 (*)  All other components within normal limits  COMPREHENSIVE METABOLIC PANEL - Abnormal; Notable for the following:    Potassium 6.9 (*)    CO2 15 (*)    Glucose, Bld 140 (*)    BUN 26 (*)    Creatinine, Ser 2.45 (*)    Calcium 8.1 (*)    Total Protein 5.5 (*)    Albumin 3.2 (*)    AST 137 (*)    ALT 145 (*)    Alkaline Phosphatase 140 (*)    Total Bilirubin 1.8 (*)    GFR calc non Af Amer 24 (*)    GFR calc Af Amer 28 (*)    All other components within normal limits  D-DIMER, QUANTITATIVE (NOT AT [facility]) - Abnormal; Notable for the following:    D-Dimer, Quant 1.18 (*)    All other components within normal limits  BRAIN NATRIURETIC PEPTIDE - Abnormal; Notable for the following:    B Natriuretic Peptide 668.9 (*)    All other components within normal limits  POTASSIUM - Abnormal; Notable for the following:    Potassium 5.2 (*)    All other components within normal limits  I-STAT CG4 LACTIC ACID, ED - Abnormal;  Notable for the following:    Lactic Acid, Venous 6.82 (*)    All other components within normal limits  I-STAT TROPOININ, ED - Abnormal; Notable for the following:    Troponin i, poc 0.13 (*)    All other components within normal limits  I-STAT CG4 LACTIC ACID, ED - Abnormal; Notable for the following:    Lactic Acid, Venous 1.91 (*)    All other components within normal limits  I-STAT TROPOININ, ED - Abnormal; Notable for the following:    Troponin i, poc 0.19 (*)    All other components within normal limits  I-STAT TROPOININ, ED - Abnormal; Notable for the following:    Troponin i, poc 0.22 (*)    All other components within normal limits  TROPONIN I  TROPONIN I  TROPONIN I  BASIC METABOLIC PANEL  HEPARIN LEVEL (UNFRACTIONATED)  CBC    EKG  EKG Interpretation  Date/Time:  Wednesday March 11 2016 16:24:40 EST Ventricular Rate:  46 PR Interval:    QRS Duration: 110 QT Interval:  498 QTC Calculation: 436 R Axis:   -44 Text Interpretation:  A-flutter/fibrillation w/ complete AV block Left axis deviation Low voltage, precordial leads Minimal ST depression, lateral leads No comparison ECG Afib bradycardia Abnormal ECG Confirmed by Sherry Ruffing MD, Emma Schupp [phone]) on 03/11/2016 11:14:02 PM       Radiology Dg Chest Portable 1 View  Result Date: 03/11/2016 CLINICAL DATA:  Flutter feeling in chest today, shortness of breath, diaphoresis, episode of ventricular tachycardia, atrial fibrillation, drop in blood pressure, history COPD, hyperlipidemia, ischemic heart disease, a arrhythmias, hypertension EXAM: PORTABLE CHEST 1 VIEW COMPARISON:  Portable exam 1652 hours compared to 12/19/2013 FINDINGS: Enlargement of cardiac silhouette. Mediastinal contours and pulmonary vascularity normal. Bronchitic changes with scarring at lateral RIGHT lung base. No definite acute infiltrate, pleural effusion or pneumothorax. External pacing lead projects over chest. IMPRESSION: Chronic bronchitic  changes and RIGHT basilar scarring. Enlargement of cardiac silhouette. No acute abnormalities. Electronically Signed   By: Lavonia Dana M.D.   On: 03/11/2016 17:11    Procedures Procedures (including critical care time)  Medications Ordered in ED Medications  ferrous sulfate tablet 325 mg (not administered)  vitamin B-12 (CYANOCOBALAMIN) tablet 500 mcg (not administered)  vitamin C (ASCORBIC ACID) tablet 500 mg (  not administered)  pantoprazole (PROTONIX) EC tablet 40 mg (not administered)  ALPRAZolam (XANAX) tablet 0.5 mg (not administered)  rosuvastatin (CRESTOR) tablet 10 mg (not administered)  tiotropium (SPIRIVA) inhalation capsule 18 mcg (not administered)  nitroGLYCERIN (NITROSTAT) SL tablet 0.4 mg (not administered)  acetaminophen (TYLENOL) tablet 650 mg (not administered)  ondansetron (ZOFRAN) injection 4 mg (not administered)  aspirin EC tablet 81 mg (not administered)  heparin bolus via infusion 4,000 Units (not administered)  heparin ADULT infusion 100 units/mL (25000 units/255mL sodium chloride 0.45%) (not administered)  sodium chloride 0.9 % bolus 1,000 mL (0 mLs Intravenous Stopped 03/11/16 1921)  sodium chloride 0.9 % bolus 1,000 mL (0 mLs Intravenous Stopped 03/11/16 1730)  sodium chloride 0.9 % bolus 1,000 mL (0 mLs Intravenous Stopped 03/11/16 1740)     Initial Impression / Assessment and Plan / ED Course  I have reviewed the triage vital signs and the nursing notes.  Pertinent labs & imaging results that were available during my care of the patient were reviewed by me and considered in my medical decision making (see chart for details).  Clinical Course     75 year old male comes to [place] via EMS with chief complaint of shortness of breath. He been short of breath diaphoretic and nauseous home. No chest pain no palpitations. He was transported via EMS their use found to be in either V. tach or SVT with a Barrett's, he was short of breath and diaphoretic so they  cardioverted him. When he arrived. His and bradycardic A. fib. He has blood pressure was soft 80s over 50s, pacer pads were placed however he was symptom-free maintaining this pressure, multiple liters of normal saline are given. EKG shows bradycardic rhythm appears to be A. fib. He did have an elevated troponin up to 0.19, chest x-ray shows no acute cardiopulmonary pathology. Mild leukocytosis, initial elevated potassium however was hemolyzed repeat stent, d-dimer is positive and this patient does have history of PE. VQ scan will be ordered. BNP is elevated to 670 4 repeat potassium is 5.2 patient will be admitted to the cardiology stepdown service. Raul Del this patient's care please see inpatient team notes. Final signs stable time handoff.  Final Clinical Impressions(s) / ED Diagnoses   Final diagnoses:  SVT (supraventricular tachycardia) (HCC)  Bradycardia  SOB (shortness of breath)    New Prescriptions New Prescriptions   No medications on file     Dewaine Conger, MD 03/11/16 2337    CRITICAL CARE Performed by: Gwenyth Allegra Sharalyn Lomba Total critical care time: 45 minutes Critical care time was exclusive of separately billable procedures and treating other patients. Critical care was necessary to treat or prevent imminent or life-threatening deterioration. Critical care was time spent personally by me on the following activities: development of treatment plan with patient and/or surrogate as well as nursing, discussions with consultants, evaluation of patient's response to treatment, examination of patient, obtaining history from patient or surrogate, ordering and performing treatments and interventions, ordering and review of laboratory studies, ordering and review of radiographic studies, pulse oximetry and re-evaluation of patient's condition.    Courtney Paris, MD 03/12/16 [place], MD 03/12/16 1200

## 2016-03-11 NOTE — Progress Notes (Addendum)
Addendum: ** Pt refused heparin. ED RN called admitting team and they will hold heparin for now and re-evaluate in the a.m.**  [place] for Heparin Indication: chest pain/ACS  Allergies  Allergen Reactions  . Eliquis [Apixaban] Other (See Comments)    Bleeding event  . Codeine Other (See Comments)    Cant remember    Patient Measurements: Height: 5\' 11"  (180.3 cm) Weight: 287 lb (130.2 kg) IBW/kg (Calculated) : 75.3 Heparin Dosing Weight: 105 kg  Vital Signs: Temp: 97.6 F (36.4 C) (11/29 1637) Temp Source: Oral (11/29 1637) BP: 121/61 (11/29 2245) Pulse Rate: 68 (11/29 2245)  Labs:  Recent Labs  03/11/16 1600  HGB 15.2  HCT 47.4  PLT 179  CREATININE 2.45*    Estimated Creatinine Clearance: 35.3 mL/min (by C-G formula based on SCr of 2.45 mg/dL (H)).   Medical History: Past Medical History:  Diagnosis Date  . Anxiety   . COPD (chronic obstructive pulmonary disease) ([place])   . HLD (hyperlipidemia)   . HTN (hypertension)   . Ischemic heart disease   . Obesity   . OSA (obstructive sleep apnea)   . Palpitation   . PVC (premature ventricular contraction)   . SOB (shortness of breath)   . SVT (supraventricular tachycardia) ([place])   . Umbilical hernia   . WPW (Wolff-Parkinson-White syndrome)     Medications:  See electronic med rec  Assessment: 75 y.o. M presents with wide complex tachycardia, hypotension requiring cardioversion. Trop elevated to 0.22. To begin heparin for r/o ACS. Also planning VQ scan in a.m. to r/o PE (h/o PE 05/2104). CBC ok on admission. Noted pt with SCr 2.45, est CrCl 35 ml/min.   Goal of Therapy:  Heparin level 0.3-0.7 units/ml Monitor platelets by anticoagulation protocol: Yes   Plan:  Heparin IV bolus 4000 units Heparin gtt at 1450 units/hr Will f/u heparin level in 8 hours Daily heparin level and CBC  Sherlon Handing, PharmD, BCPS Clinical pharmacist, pager  [phone] 03/11/2016,11:24 PM

## 2016-03-11 NOTE — ED Notes (Signed)
Patient denies pain and is resting comfortably.  

## 2016-03-11 NOTE — ED Notes (Signed)
Pt given water per request

## 2016-03-11 NOTE — H&P (Signed)
Patient ID: DALIAN SMID MRN: [id number], DOB/AGE: 1940/08/15   Admit date: 03/11/2016   Primary Physician: Ann Held, MD Primary Cardiologist: Dr. Caryl Comes  Pt. Profile:  Chad Hogan is a super morbidly obese 75 year old Caucasian male with past medical history of SVT in the context of WPW, history of PE in February 2016 finished a course of eliquis, COPD, obstructive sleep apnea, hypertension, and hyperlipidemia presented with VT underwent emergent cardioversion  Problem List  Past Medical History:  Diagnosis Date  . Anxiety   . COPD (chronic obstructive pulmonary disease) ([place])   . HLD (hyperlipidemia)   . HTN (hypertension)   . Ischemic heart disease   . Obesity   . OSA (obstructive sleep apnea)   . Palpitation   . PVC (premature ventricular contraction)   . SOB (shortness of breath)   . SVT (supraventricular tachycardia) ([place])   . Umbilical hernia   . WPW (Wolff-Parkinson-White syndrome)     Past Surgical History:  Procedure Laterality Date  . COLONOSCOPY WITH PROPOFOL N/A 06/11/2014   Procedure: COLONOSCOPY WITH PROPOFOL;  Surgeon: Inda Castle, MD;  Location: [facility];  Service: Endoscopy;  Laterality: N/A;  . ESOPHAGOGASTRODUODENOSCOPY (EGD) WITH PROPOFOL N/A 06/11/2014   Procedure: ESOPHAGOGASTRODUODENOSCOPY (EGD) WITH PROPOFOL;  Surgeon: Inda Castle, MD;  Location: [facility];  Service: Endoscopy;  Laterality: N/A;  . HERNIA REPAIR       Allergies  Allergies  Allergen Reactions  . Codeine     Cant remember    HPI  Chad Hogan is a super morbidly obese 75 year old Caucasian male with past medical history of SVT in the context of WPW, history of PE in February 2016 finished a course of eliquis, COPD, obstructive sleep apnea, hypertension, and hyperlipidemia. He used to be on both were no under detoxing, however there was evidence of antegrade conduction over accessory pathway, therefore his digoxin was discontinued. According to Dr. Olin Pia note, he  had a progressive loss of antegrade conduction over time. He had a Myoview a year in September 2006 that demonstrated a moderate sized inferior scar, no ischemia, EF 55%.  He has some issues earlier this year when his insurance changed the brand of his verapamil which he think is not controlling his heart rate as well. Otherwise he has been doing well at home. Unfortunately, he does not do any strenuous activities. He has no desire to do any activities either. According to the patient, the most strenuous activity he did in the last 2 weeks was to use the remote control to turn the TV channels. He was using his usual state of health until 1 AM in the morning of 03/11/2016 when he woke up with palpitation. He says that palpitation quickly went away and he no longer have any cardiac awareness symptoms afterward. As the day went on, he had progression of shortness of breath especially with exertion. He denies any dizziness or feeling of passing out initially, however as to shortness breath worsened, he feels like he was going to pass out. He eventually called EMS and was taken to [facility].   Initial significant blood work included a potassium of 6.9, however lab was hemolyzed, creatinine 2.45 W. previous creatinine was 0.9. Elevated AST 137, elevated ALT 145, BNP 668, troponin 0.13, lactic acid 6.82, white blood cell count 13.3. D-dimer was 1.18. Chest x-ray showed no acute abnormalities. His initial rhythm was in ventricular tachycardia, his initial blood pressure was in the 70s, he underwent emergent cardioversion in  the ED. He received 1 L of IV fluid by EMS, he received 2 more liter of IV fluid in the emergency room before his systolic blood pressure went above 100. Cardiology has been consulted for VT versus SVT with aberrancy.   Home Medications  Prior to Admission medications   Medication Sig Start Date End Date Taking? Authorizing Provider  ALPRAZolam Duanne Moron) 0.5 MG tablet Take 0.5 mg by  mouth 3 (three) times daily as needed for anxiety.     Historical Provider, MD  ferrous sulfate 325 (65 FE) MG tablet Take 1 tablet by mouth daily. 08/20/15   Historical Provider, MD  Fluticasone-Salmeterol (ADVAIR DISKUS) 250-50 MCG/DOSE AEPB Inhale 1 puff into the lungs every 12 (twelve) hours.      Historical Provider, MD  furosemide (LASIX) 40 MG tablet Take 40 mg by mouth 2 (two) times daily.     Historical Provider, MD  losartan (COZAAR) 100 MG tablet Take 100 mg by mouth daily.  12/10/11   Historical Provider, MD  metoprolol (LOPRESSOR) 50 MG tablet Take 50 mg by mouth 2 (two) times daily.      Historical Provider, MD  omeprazole (PRILOSEC) 20 MG capsule Take 20 mg by mouth daily.  04/10/14   Historical Provider, MD  rosuvastatin (CRESTOR) 10 MG tablet Take 10 mg by mouth at bedtime.     Historical Provider, MD  tiotropium (SPIRIVA) 18 MCG inhalation capsule Place 18 mcg into inhaler and inhale daily.      Historical Provider, MD  verapamil (CALAN-SR) 240 MG CR tablet Take 1 tablet (240 mg total) by mouth 2 (two) times daily. 12/17/15   Deboraha Sprang, MD  vitamin B-12 (CYANOCOBALAMIN) 500 MCG tablet Take 500 mcg by mouth daily.    Historical Provider, MD  vitamin C (ASCORBIC ACID) 500 MG tablet Take 500 mg by mouth daily.     Historical Provider, MD    Family History  Family History  Problem Relation Age of Onset  . Liver cancer    . Heart disease      Social History  Social History   Social History  . Marital status: Divorced    Spouse name: N/A  . Number of children: 2  . Years of education: N/A   Occupational History  . retired  Psychologist, prison and probation services   Social History Main Topics  . Smoking status: Former Smoker    Quit date: 04/13/1996  . Smokeless tobacco: Never Used  . Alcohol use No  . Drug use: No  . Sexual activity: Not Currently   Other Topics Concern  . Not on file   Social History Narrative  . No narrative on file     Review of Systems General:  No chills, fever,  night sweats or weight changes.  Cardiovascular:  No chest pain, dyspnea on exertion, edema, orthopnea, paroxysmal nocturnal dyspnea. +palpitations Dermatological: No rash, lesions/masses Respiratory: No cough, dyspnea Urologic: No hematuria, dysuria Abdominal:   No nausea, vomiting, diarrhea, bright red blood per rectum, melena, or hematemesis Neurologic:  No visual changes + wkns, changes in mental status. All other systems reviewed and are otherwise negative except as noted above.  Physical Exam  Blood pressure 90/60, pulse (!) 41, temperature 97.6 F (36.4 C), temperature source Oral, resp. rate 18, height 5\' 11"  (1.803 m), weight 287 lb (130.2 kg), SpO2 93 %.  General: Pleasant, NAD Psych: Normal affect. Neuro: Alert and oriented X 3. Moves all extremities spontaneously. HEENT: Normal  Neck: Supple without bruits or JVD. Lungs:  Resp regular and unlabored, CTA. Heart: RRR no s3, s4, or murmurs. Abdomen: Soft, non-tender, non-distended, BS + x 4.  Extremities: No clubbing, cyanosis or edema. DP/PT/Radials 2+ and equal bilaterally.  Labs  Troponin [facility] Test)  Recent Labs  03/11/16 1706  TROPIPOC 0.13*   No results for input(s): CKTOTAL, CKMB, TROPONINI in the last 72 hours. Lab Results  Component Value Date   WBC 13.3 (H) 03/11/2016   HGB 15.2 03/11/2016   HCT 47.4 03/11/2016   MCV 92.2 03/11/2016   PLT 179 03/11/2016     Recent Labs Lab 03/11/16 1600  NA 138  K 6.9*  CL 110  CO2 15*  BUN 26*  CREATININE 2.45*  CALCIUM 8.1*  PROT 5.5*  BILITOT 1.8*  ALKPHOS 140*  ALT 145*  AST 137*  GLUCOSE 140*   No results found for: CHOL, HDL, LDLCALC, TRIG Lab Results  Component Value Date   DDIMER 1.18 (H) 03/11/2016     Radiology/Studies  Dg Chest Portable 1 View  Result Date: 03/11/2016 CLINICAL DATA:  Flutter feeling in chest today, shortness of breath, diaphoresis, episode of ventricular tachycardia, atrial fibrillation, drop in blood  pressure, history COPD, hyperlipidemia, ischemic heart disease, a arrhythmias, hypertension EXAM: PORTABLE CHEST 1 VIEW COMPARISON:  Portable exam 1652 hours compared to 12/19/2013 FINDINGS: Enlargement of cardiac silhouette. Mediastinal contours and pulmonary vascularity normal. Bronchitic changes with scarring at lateral RIGHT lung base. No definite acute infiltrate, pleural effusion or pneumothorax. External pacing lead projects over chest. IMPRESSION: Chronic bronchitic changes and RIGHT basilar scarring. Enlargement of cardiac silhouette. No acute abnormalities. Electronically Signed   By: Lavonia Dana M.D.   On: 03/11/2016 17:11    ECG  Sinus bradycardia  Echocardiogram 01/04/2014      ASSESSMENT AND PLAN  1. Wide-complex tachycardia with severe hypotension: VT versus SVT with aberrancy  - Labs consistent with persistent hypotension for long period of time. Patient was having progressive symptoms for up to 16 hours before he sought medical attention.  - Will continue to hydrate the patient, serial troponin overnight, obtain echocardiogram. Hold rate control medication tonight due to soft blood pressure and restart tomorrow once blood pressure,. EP to follow-up in the morning.  2. Hyperkalemia: Unclear if factitious lab result, repeat potassium has been drawn.  3. Acute kidney injury likely due to hypoperfusion  4. Elevated transaminase also due to hypoperfusion  5. Elevated d-dimer: Patient is sedentary at home, history of PE in February 2016, unfortunately given acute kidney injury, unable to obtain CTA, will order VQ scan.  6  Signed, Almyra Deforest, PA-C 03/11/2016, 6:45 PM   I have personally seen and examined this patient with Almyra Deforest, PA-C. I agree with the assessment and plan as outlined above. He is known to have SVT and also WPW and is followed in our EP clinic by Dr. Caryl Comes. He is admitted with wide complex tach, hypotension requiring cardioversion. He is now mildly  hypotensive in sinus brady. My exam shows an obese male in NAD. [id number], no loud murmurs. Lungs: clear bilat. Ext: no edema.  Labs reviewed. Elevated K but likely hemolyzed. Elevated lactic acid and renal function likely representing period of hypoperfusion. He also has an elevated D-dimer.  Wide complex tach is either SVT with aberrancy or VT.  Will admit to stepdown. Hydrate tonight. HOld beta blocker and Ca channel blocker. Echo tomorrow. Cycle troponin. Repeat K. Repeat lactic acid.  Check V/Q scan to exclude PE. (not a candidate for CTA  given ARF). EP to see in am. May need ischemic workup.   Lauree Chandler 03/11/2016 7:26 PM

## 2016-03-12 ENCOUNTER — Inpatient Hospital Stay (HOSPITAL_COMMUNITY): Payer: Commercial Managed Care - HMO

## 2016-03-12 DIAGNOSIS — R748 Abnormal levels of other serum enzymes: Secondary | ICD-10-CM

## 2016-03-12 LAB — COMPREHENSIVE METABOLIC PANEL
ALBUMIN: 3.4 g/dL — AB (ref 3.5–5.0)
ALK PHOS: 129 U/L — AB (ref 38–126)
ALT: 490 U/L — ABNORMAL HIGH (ref 17–63)
ANION GAP: 8 (ref 5–15)
AST: 339 U/L — ABNORMAL HIGH (ref 15–41)
BUN: 28 mg/dL — ABNORMAL HIGH (ref 6–20)
CALCIUM: 8 mg/dL — AB (ref 8.9–10.3)
CHLORIDE: 108 mmol/L (ref 101–111)
CO2: 20 mmol/L — AB (ref 22–32)
Creatinine, Ser: 1.28 mg/dL — ABNORMAL HIGH (ref 0.61–1.24)
GFR calc Af Amer: >60 mL/min (ref >60)
GFR calc non Af Amer: 53 mL/min — ABNORMAL LOW (ref >60)
GLUCOSE: 95 mg/dL (ref 65–99)
POTASSIUM: 4.6 mmol/L (ref 3.5–5.1)
SODIUM: 136 mmol/L (ref 135–145)
Total Bilirubin: 0.3 mg/dL (ref 0.3–1.2)
Total Protein: 5.9 g/dL — ABNORMAL LOW (ref 6.5–8.1)

## 2016-03-12 LAB — BASIC METABOLIC PANEL
ANION GAP: 8 (ref 5–15)
BUN: 29 mg/dL — ABNORMAL HIGH (ref 6–20)
CALCIUM: 8 mg/dL — AB (ref 8.9–10.3)
CO2: 20 mmol/L — ABNORMAL LOW (ref 22–32)
Chloride: 110 mmol/L (ref 101–111)
Creatinine, Ser: 1.64 mg/dL — ABNORMAL HIGH (ref 0.61–1.24)
GFR, EST AFRICAN AMERICAN: 45 mL/min — AB (ref >60)
GFR, EST NON AFRICAN AMERICAN: 39 mL/min — AB (ref >60)
GLUCOSE: 74 mg/dL (ref 65–99)
Potassium: 4.6 mmol/L (ref 3.5–5.1)
Sodium: 138 mmol/L (ref 135–145)

## 2016-03-12 LAB — TROPONIN I
TROPONIN I: 0.28 ng/mL — AB (ref <0.03)
TROPONIN I: 0.21 ng/mL — AB (ref <0.03)
Troponin I: 0.24 ng/mL (ref <0.03)

## 2016-03-12 LAB — MRSA PCR SCREENING: MRSA by PCR: NEGATIVE

## 2016-03-12 MED ORDER — METOPROLOL TARTRATE 50 MG PO TABS
50.0000 mg | ORAL_TABLET | Freq: Two times a day (BID) | ORAL | Status: DC
  Administered 2016-03-12 – 2016-03-16 (×8): 50 mg via ORAL
  Filled 2016-03-12 (×9): qty 1

## 2016-03-12 MED ORDER — TECHNETIUM TC 99M DIETHYLENETRIAME-PENTAACETIC ACID: 31.1000 | Freq: Once | INTRAVENOUS | Status: DC | PRN

## 2016-03-12 MED ORDER — TECHNETIUM TO 99M ALBUMIN AGGREGATED
4.1000 | Freq: Once | INTRAVENOUS | Status: AC | PRN
  Administered 2016-03-12: 4.1 via INTRAVENOUS

## 2016-03-12 MED ORDER — ORAL CARE MOUTH RINSE
15.0000 mL | Freq: Two times a day (BID) | OROMUCOSAL | Status: DC
  Administered 2016-03-12 – 2016-03-17 (×8): 15 mL via OROMUCOSAL

## 2016-03-12 NOTE — Progress Notes (Signed)
Pt. had bright red blood in stool at 14:45.  MD made aware.  Thinking it is possibly from internal hemorrhoid, CBC to be checked in the am.  Pt. Is on no anti-coagulation.

## 2016-03-12 NOTE — Progress Notes (Addendum)
     SUBJECTIVE: NO complaints. No chest pain. He did get a little winded walking to the bathroom  Tele: sinus  BP 128/63   Pulse 69   Temp 98 F (36.7 C) (Oral)   Resp (!) 22   Ht 5\' 11"  (1.803 m)   Wt 293 lb 4.8 oz (133 kg)   SpO2 93%   BMI 40.91 kg/m   Intake/Output Summary (Last 24 hours) at 03/12/16 [id number] Last data filed at 03/12/16 0051  Gross per 24 hour  Intake             2000 ml  Output              325 ml  Net             1675 ml    PHYSICAL EXAM General: Well developed, well nourished, in no acute distress. Alert and oriented x 3.  Psych:  Good affect, responds appropriately Neck: No JVD. No masses noted.  Lungs: Clear bilaterally with no wheezes or rhonci noted.  Heart: RRR with no murmurs noted. Abdomen: Bowel sounds are present. Soft, non-tender.  Extremities: No lower extremity edema.   LABS: Basic Metabolic Panel:  Recent Labs  03/11/16 1600 03/11/16 1844 03/12/16 0544  NA 138  --  138  K 6.9* 5.2* 4.6  CL 110  --  110  CO2 15*  --  20*  GLUCOSE 140*  --  74  BUN 26*  --  29*  CREATININE 2.45*  --  1.64*  CALCIUM 8.1*  --  8.0*   CBC:  Recent Labs  03/11/16 1600  WBC 13.3*  NEUTROABS 10.4*  HGB 15.2  HCT 47.4  MCV 92.2  PLT 179   Cardiac Enzymes:  Recent Labs  03/11/16 2320 03/12/16 0544  TROPONINI 0.24* 0.28*    Current Meds: . aspirin EC  81 mg Oral Daily  . ferrous sulfate  325 mg Oral Daily  . mouth rinse  15 mL Mouth Rinse BID  . pantoprazole  40 mg Oral Daily  . rosuvastatin  10 mg Oral QHS  . tiotropium  18 mcg Inhalation Daily  . vitamin B-12  500 mcg Oral Daily  . vitamin C  500 mg Oral Daily     ASSESSMENT AND PLAN:  1. Wide complex tachycardia: This could be SVT with aberrancy vs VT. No further arrhythmias overnight. EP to see today. Beta blocker and verapamil on hold due to heart rate 40-45 bpm after cardioversion. Will restart metoprolol today. Echo is pending today to assess LV function.   2.  Elevated troponin: Likely demand ischemia in setting of hypotension during period of tachycardia. Given possible VT, may need ischemic evaluation. He is not a good candidate for cath today given renal issues. Will discuss with EP team.   3. Acute renal failure: In setting of prolonged hypotension. Improving this am.   4. Elevated d-dimer: V/Q scan pending this am. He has a history of PE. CTA not planned due to renal insufficiency. Pt refused heparin drip due to hemorrhoids and problems with bleeding in the past on Eliquis.     Lauree Chandler  11/30/20177:12 AM

## 2016-03-12 NOTE — Consult Note (Signed)
ELECTROPHYSIOLOGY CONSULT NOTE    Patient ID: Chad Hogan MRN: [id number], DOB/AGE: [birth year] 75 y.o.  Admit date: 03/11/2016 Date of Consult: 03/12/2016   Primary Physician: Ann Held, MD Primary Cardiologist: Caryl Comes  Reason for Consultation: WCT  HPI:  Chad Hogan is a 75 y.o. male with a past medical history significant for morbid obesity, COPD, hypertension, hyperlipidemia, prior PE, OSA, and WPW. He has been followed by Dr Caryl Comes and has been medically managed with CCB and BB.  He has lost antegrade conduction of his pathway.  Yesterday, he turned over in bed and felt his heart "jump".  He then went back to sleep and did not have significant palpitations, but did have progressive shortness of breath. Eventually, after several hours, he called 911 and was found to be in [facility] with hypotension for which he was emergently cardioverted.  Labs on admission were significant for lactic acidosis, AKI, and elevated LFT's.  He has been fluid resuscitated and is significantly improved today.  Echo from 2015 demonstrated normal LV function. Repeat echo is pending this admission.  Telemetry has demonstrated sinus rhythm.   He currently denies chest pain, shortness of breath, syncope, recent fevers, chills, nausea or vomiting.  He is very sedentary at home.     Past Medical History:  Diagnosis Date  . Anxiety   . COPD (chronic obstructive pulmonary disease) ([place])   . HLD (hyperlipidemia)   . HTN (hypertension)   . Ischemic heart disease   . Obesity   . OSA (obstructive sleep apnea)   . Palpitation   . PVC (premature ventricular contraction)   . SOB (shortness of breath)   . SVT (supraventricular tachycardia) ([place])   . Umbilical hernia   . WPW (Wolff-Parkinson-White syndrome)      Surgical History:  Past Surgical History:  Procedure Laterality Date  . COLONOSCOPY WITH PROPOFOL N/A 06/11/2014   Procedure: COLONOSCOPY WITH PROPOFOL;  Surgeon: Inda Castle, MD;  Location: [facility];  Service: Endoscopy;  Laterality: N/A;  . ESOPHAGOGASTRODUODENOSCOPY (EGD) WITH PROPOFOL N/A 06/11/2014   Procedure: ESOPHAGOGASTRODUODENOSCOPY (EGD) WITH PROPOFOL;  Surgeon: Inda Castle, MD;  Location: [facility];  Service: Endoscopy;  Laterality: N/A;  . HERNIA REPAIR       Prescriptions Prior to Admission  Medication Sig Dispense Refill Last Dose  . ALPRAZolam (XANAX) 0.5 MG tablet Take 0.75 mg by mouth at bedtime.    03/11/2016 at Unknown time  . ferrous sulfate 325 (65 FE) MG tablet Take 1 tablet by mouth daily.  3 03/11/2016 at Unknown time  . Fluticasone-Salmeterol (ADVAIR DISKUS) 250-50 MCG/DOSE AEPB Inhale 1 puff into the lungs every 12 (twelve) hours.     03/11/2016 at am  . furosemide (LASIX) 40 MG tablet Take 40 mg by mouth 2 (two) times daily.    03/11/2016 at am  . losartan (COZAAR) 100 MG tablet Take 100 mg by mouth daily.    03/11/2016 at Unknown time  . Methylcobalamin (B-12) 5000 MCG TBDP Take 5,000 Units by mouth daily.   03/11/2016 at Unknown time  . metoprolol (LOPRESSOR) 50 MG tablet Take 50 mg by mouth 2 (two) times daily.     03/11/2016 at 0900  . Multiple Vitamin (MULTIVITAMIN WITH MINERALS) TABS tablet Take 1 tablet by mouth daily.   03/11/2016 at Unknown time  . omeprazole (PRILOSEC) 20 MG capsule Take 20 mg by mouth daily.   1 03/11/2016 at Unknown time  . OVER THE COUNTER MEDICATION Inhale 1 application into  the lungs at bedtime. CPAP with O2   03/10/2016 at Unknown time  . rosuvastatin (CRESTOR) 10 MG tablet Take 10 mg by mouth at bedtime.    03/10/2016 at Unknown time  . tiotropium (SPIRIVA) 18 MCG inhalation capsule Place 18 mcg into inhaler and inhale daily.     03/11/2016 at Unknown time  . verapamil (CALAN-SR) 240 MG CR tablet Take 1 tablet (240 mg total) by mouth 2 (two) times daily. 60 tablet 8 03/11/2016 at am  . vitamin C (ASCORBIC ACID) 500 MG tablet Take 500 mg by mouth daily.    03/11/2016 at Unknown time    Inpatient Medications:  .  aspirin EC  81 mg Oral Daily  . ferrous sulfate  325 mg Oral Daily  . mouth rinse  15 mL Mouth Rinse BID  . metoprolol tartrate  50 mg Oral BID  . pantoprazole  40 mg Oral Daily  . rosuvastatin  10 mg Oral QHS  . tiotropium  18 mcg Inhalation Daily  . vitamin B-12  500 mcg Oral Daily  . vitamin C  500 mg Oral Daily    Allergies:  Allergies  Allergen Reactions  . Eliquis [Apixaban] Other (See Comments)    Bleeding event  . Codeine Other (See Comments)    Cant remember    Social History   Social History  . Marital status: Divorced    Spouse name: N/A  . Number of children: 2  . Years of education: N/A   Occupational History  . retired  Psychologist, prison and probation services   Social History Main Topics  . Smoking status: Former Smoker    Quit date: 04/13/1996  . Smokeless tobacco: Never Used  . Alcohol use No  . Drug use: No  . Sexual activity: Not Currently   Other Topics Concern  . Not on file   Social History Narrative  . No narrative on file     Family History  Problem Relation Age of Onset  . Liver cancer    . Heart disease       Review of Systems: All other systems reviewed and are otherwise negative except as noted above.  Physical Exam: Vitals:   03/12/16 0900 03/12/16 1000 03/12/16 1100 03/12/16 1200  BP: 132/60  (!) 141/72   Pulse: 65 74 69 70  Resp: 18 20 15  (!) 23  Temp:      TempSrc:      SpO2: 94% 95% 96% 92%  Weight:      Height:        GEN- The patient is obese appearing, alert and oriented x 3 today.   HEENT: normocephalic, atraumatic; sclera clear, conjunctiva pink; hearing intact; oropharynx clear; neck supple  Lungs- Clear to ausculation bilaterally, normal work of breathing.  No wheezes, rales, rhonchi Heart- Regular rate and rhythm  GI- soft, non-tender, non-distended, bowel sounds present  Extremities- no clubbing, cyanosis, +dependent BLE edema MS- no significant deformity or atrophy Skin- warm and dry, no rash or lesion Psych- euthymic mood, full  affect Neuro- strength and sensation are intact  Labs:   Lab Results  Component Value Date   WBC 13.3 (H) 03/11/2016   HGB 15.2 03/11/2016   HCT 47.4 03/11/2016   MCV 92.2 03/11/2016   PLT 179 03/11/2016    Recent Labs Lab 03/11/16 1600  03/12/16 0544  NA 138  --  138  K 6.9*  < > 4.6  CL 110  --  110  CO2 15*  --  20*  BUN 26*  --  29*  CREATININE 2.45*  --  1.64*  CALCIUM 8.1*  --  8.0*  PROT 5.5*  --   --   BILITOT 1.8*  --   --   ALKPHOS 140*  --   --   ALT 145*  --   --   AST 137*  --   --   GLUCOSE 140*  --  74  < > = values in this interval not displayed.    Radiology/Studies: Nm Pulmonary Perf And Vent Result Date: 03/12/2016 CLINICAL DATA:  Shortness of breath. EXAM: NUCLEAR MEDICINE VENTILATION - PERFUSION LUNG SCAN TECHNIQUE: Ventilation images were obtained in multiple projections using inhaled aerosol Tc-60m DTPA. Perfusion images were obtained in multiple projections after intravenous injection of Tc-2m MAA. RADIOPHARMACEUTICALS:  31.1 mCi Technetium-44m DTPA aerosol inhalation and 4.1 mCi Technetium-87m MAA IV COMPARISON:  PET-CT 02/13/2014. FINDINGS: Small matching ventilation and perfusion defects are noted. Scan is indeterminate for pulmonary embolus. IMPRESSION: Small matching ventilation perfusion defects. Indeterminate scan for pulmonary embolus . Electronically Signed   By: Marcello Moores  Register   On: 03/12/2016 11:38   Dg Chest Portable 1 View Result Date: 03/11/2016 CLINICAL DATA:  Flutter feeling in chest today, shortness of breath, diaphoresis, episode of ventricular tachycardia, atrial fibrillation, drop in blood pressure, history COPD, hyperlipidemia, ischemic heart disease, a arrhythmias, hypertension EXAM: PORTABLE CHEST 1 VIEW COMPARISON:  Portable exam 1652 hours compared to 12/19/2013 FINDINGS: Enlargement of cardiac silhouette. Mediastinal contours and pulmonary vascularity normal. Bronchitic changes with scarring at lateral RIGHT lung base. No  definite acute infiltrate, pleural effusion or pneumothorax. External pacing lead projects over chest. IMPRESSION: Chronic bronchitic changes and RIGHT basilar scarring. Enlargement of cardiac silhouette. No acute abnormalities. Electronically Signed   By: Lavonia Dana M.D.   On: 03/11/2016 17:11    EKG: junctional rhythm, rate 46  TELEMETRY: sinus rhythm  Assessment/Plan: 1.  WCT EMS strips show WCT at a rate of 150bpm with precordial concordance.  Dr Lovena Le reviewed and concern is that rhythm is ventricular tachycardia. Ischemic evaluation is warranted. With size, myoview scanning would likely not be as helpful. Will plan LHC tomorrow with possible EPS to follow.  Will hydrate overnight tonight.  I have asked that echo be done today   2.  Elevated D-dimer VQ scan indeterminate With prior history of PE, likely will need to do CT scan. Will discuss with Dr Lovena Le.  He is currently refusing Heparin  3.  Obesity Body mass index is 40.91 kg/m. Weight loss encouraged    Signed, Chanetta Marshall, NP 03/12/2016 12:12 PM  EP Attending  Patient seen and examined. Agree with above. The patient is a morbidly obese man with known WPW who has had resolution of ventricular pre-excitation who presented with a wide QRS tachy which looks like VT. However, he has had preserved LV function by echo. He did not have a large troponin increase. He denies any anginal symptoms but admits to a very sedentary lifestyle. With regard to his tachycardia, he will need EP study. If he has developed LV dysfunction, he will need cardiac cath and possible ICD. If he has normal LV function. He will need an EP study and catheter ablation if inducible SVT.    Mikle Bosworth.D.

## 2016-03-13 ENCOUNTER — Inpatient Hospital Stay (HOSPITAL_COMMUNITY): Payer: Commercial Managed Care - HMO

## 2016-03-13 ENCOUNTER — Encounter (HOSPITAL_COMMUNITY)
Admission: EM | Disposition: A | Discharge status: Home / Self Care | Payer: Self-pay | Source: Home / Self Care | Attending: Cardiovascular Disease

## 2016-03-13 ENCOUNTER — Encounter (HOSPITAL_COMMUNITY): Payer: Self-pay

## 2016-03-13 DIAGNOSIS — K5730 Diverticulosis of large intestine without perforation or abscess without bleeding: Secondary | ICD-10-CM

## 2016-03-13 DIAGNOSIS — K921 Melena: Secondary | ICD-10-CM

## 2016-03-13 DIAGNOSIS — K922 Gastrointestinal hemorrhage, unspecified: Secondary | ICD-10-CM

## 2016-03-13 DIAGNOSIS — I472 Ventricular tachycardia: Secondary | ICD-10-CM

## 2016-03-13 LAB — CBC
HCT: 40 % (ref 39.0–52.0)
Hemoglobin: 13 g/dL (ref 13.0–17.0)
MCH: 29.5 pg (ref 26.0–34.0)
MCHC: 32.5 g/dL (ref 30.0–36.0)
MCV: 90.9 fL (ref 78.0–100.0)
PLATELETS: 149 10*3/uL — AB (ref 150–400)
RBC: 4.4 MIL/uL (ref 4.22–5.81)
RDW: 13.4 % (ref 11.5–15.5)
WBC: 8.9 10*3/uL (ref 4.0–10.5)

## 2016-03-13 LAB — LACTIC ACID, PLASMA
Lactic Acid, Venous: 1.4 mmol/L (ref 0.5–1.9)
Lactic Acid, Venous: 1.4 mmol/L (ref 0.5–1.9)

## 2016-03-13 LAB — PROTIME-INR
INR: 1.23
PROTHROMBIN TIME: 15.5 s — AB (ref 11.4–15.2)

## 2016-03-13 SURGERY — LEFT HEART CATH AND CORONARY ANGIOGRAPHY
Anesthesia: LOCAL

## 2016-03-13 MED ORDER — IOPAMIDOL (ISOVUE-370) INJECTION 76%
INTRAVENOUS | Status: AC
  Administered 2016-03-13: 100 mL
  Filled 2016-03-13: qty 100

## 2016-03-13 MED ORDER — SODIUM CHLORIDE 0.9 % IV SOLN
INTRAVENOUS | Status: AC
  Administered 2016-03-13: 13:00:00 via INTRAVENOUS

## 2016-03-13 MED ORDER — HYDROCORTISONE ACETATE 25 MG RE SUPP
25.0000 mg | Freq: Two times a day (BID) | RECTAL | Status: DC
  Administered 2016-03-13 – 2016-03-17 (×7): 25 mg via RECTAL
  Filled 2016-03-13 (×12): qty 1

## 2016-03-13 NOTE — Consult Note (Signed)
Referring Provider: Dr. Julianne Handler Primary Care Physician:  Ann Held, MD Primary Gastroenterologist:  Dr. Deatra Ina  Reason for Consultation:  Rectal bleeding; needs anticoagulation  HPI: Chad Hogan is a 75 y.o. male who is morbidly obese with past medical history of SVT in the context of WPW, history of PE in February 2016 finished a course of eliquis (not on any blood thinners including ASA at the time of admission), COPD, obstructive sleep apnea, hypertension, and hyperlipidemia who presented with VT underwent emergent cardioversion.  After further evaluation from cardiac standpoint he needs to undergo a cardiac cath (also has elevated troponin) and had a VQ scan that was indeterminate for PE.  Now CT angio is done and negative for PE.  Cardiology is asking [place] to see him due to some bleeding that he had this AM with a BM.  Will possibly need anticoagulation/antiplatelet.  Hgb is normal/stable at 13.0 grams this AM (was 15.2 grams on admission).  Patient tells me that he had several episodes overnight (every 2-3 hours) with sensation to move his bowels and then would passing only blood.  It was a larger amount then he ever recalls seeing from his hemorrhoids in the past but no clots, etc reported.  He tells me that it has slowed down this morning.  I saw a loose brown stool without blood in the bedside commode that patient had just prior to my rectal exam.  Denies any abdominal pain, nausea, vomiting, constipation.  Colonoscopy 05/2014 by Dr. Deatra Ina:  IMPRESSIONS: 1. There was severe diverticulosis noted in the descending colon and sigmoid colon 2. Internal hemorrhoids 3. Sessile polyp was found in the proximal sigmoid colon; polypectomy was performed using snare cautery.  Pathology showed tubular adenoma without dysplasia.  EGD also 05/2014 by Dr. Deatra Ina:  ENDOSCOPIC IMPRESSION: benign appearance of mucosal mass in duodenum with superficial ulcer  Pathology showed benign small bowel  mucosa.  Past Medical History:  Diagnosis Date  . Anxiety   . COPD (chronic obstructive pulmonary disease) ([place])   . HLD (hyperlipidemia)   . HTN (hypertension)   . Ischemic heart disease   . Obesity   . OSA (obstructive sleep apnea)   . Palpitation   . PVC (premature ventricular contraction)   . SOB (shortness of breath)   . SVT (supraventricular tachycardia) ([place])   . Umbilical hernia   . WPW (Wolff-Parkinson-White syndrome)     Past Surgical History:  Procedure Laterality Date  . COLONOSCOPY WITH PROPOFOL N/A 06/11/2014   Procedure: COLONOSCOPY WITH PROPOFOL;  Surgeon: Inda Castle, MD;  Location: [facility];  Service: Endoscopy;  Laterality: N/A;  . ESOPHAGOGASTRODUODENOSCOPY (EGD) WITH PROPOFOL N/A 06/11/2014   Procedure: ESOPHAGOGASTRODUODENOSCOPY (EGD) WITH PROPOFOL;  Surgeon: Inda Castle, MD;  Location: [facility];  Service: Endoscopy;  Laterality: N/A;  . HERNIA REPAIR      Prior to Admission medications   Medication Sig Start Date End Date Taking? Authorizing Provider  ALPRAZolam Duanne Moron) 0.5 MG tablet Take 0.75 mg by mouth at bedtime.    Yes Historical Provider, MD  ferrous sulfate 325 (65 FE) MG tablet Take 1 tablet by mouth daily. 08/20/15  Yes Historical Provider, MD  Fluticasone-Salmeterol (ADVAIR DISKUS) 250-50 MCG/DOSE AEPB Inhale 1 puff into the lungs every 12 (twelve) hours.     Yes Historical Provider, MD  furosemide (LASIX) 40 MG tablet Take 40 mg by mouth 2 (two) times daily.    Yes Historical Provider, MD  losartan (COZAAR) 100 MG tablet Take 100  mg by mouth daily.  12/10/11  Yes Historical Provider, MD  Methylcobalamin (B-12) 5000 MCG TBDP Take 5,000 Units by mouth daily.   Yes Historical Provider, MD  metoprolol (LOPRESSOR) 50 MG tablet Take 50 mg by mouth 2 (two) times daily.     Yes Historical Provider, MD  Multiple Vitamin (MULTIVITAMIN WITH MINERALS) TABS tablet Take 1 tablet by mouth daily.   Yes Historical Provider, MD  omeprazole (PRILOSEC)  20 MG capsule Take 20 mg by mouth daily.  04/10/14  Yes Historical Provider, MD  OVER THE COUNTER MEDICATION Inhale 1 application into the lungs at bedtime. CPAP with O2   Yes Historical Provider, MD  rosuvastatin (CRESTOR) 10 MG tablet Take 10 mg by mouth at bedtime.    Yes Historical Provider, MD  tiotropium (SPIRIVA) 18 MCG inhalation capsule Place 18 mcg into inhaler and inhale daily.     Yes Historical Provider, MD  verapamil (CALAN-SR) 240 MG CR tablet Take 1 tablet (240 mg total) by mouth 2 (two) times daily. 12/17/15  Yes Deboraha Sprang, MD  vitamin C (ASCORBIC ACID) 500 MG tablet Take 500 mg by mouth daily.    Yes Historical Provider, MD    Current Facility-Administered Medications  Medication Dose Route Frequency Provider Last Rate Last Dose  . 0.9 %  sodium chloride infusion   Intravenous Continuous Burnell Blanks, MD      . acetaminophen (TYLENOL) tablet 650 mg  650 mg Oral Q4H PRN Almyra Deforest, PA      . ALPRAZolam Duanne Moron) tablet 0.5 mg  0.5 mg Oral TID PRN Almyra Deforest, PA   0.5 mg at 03/12/16 0847  . aspirin EC tablet 81 mg  81 mg Oral Daily Almyra Deforest, [place]   81 mg at 03/12/16 0958  . ferrous sulfate tablet 325 mg  325 mg Oral Daily Almyra Deforest, [place]   325 mg at 03/12/16 0847  . MEDLINE mouth rinse  15 mL Mouth Rinse BID Burnell Blanks, MD   15 mL at 03/12/16 2150  . metoprolol (LOPRESSOR) tablet 50 mg  50 mg Oral BID Burnell Blanks, MD   50 mg at 03/12/16 2150  . nitroGLYCERIN (NITROSTAT) SL tablet 0.4 mg  0.4 mg Sublingual Q5 Min x 3 PRN Almyra Deforest, PA      . ondansetron (ZOFRAN) injection 4 mg  4 mg Intravenous Q6H PRN Almyra Deforest, PA      . pantoprazole (PROTONIX) EC tablet 40 mg  40 mg Oral Daily Almyra Deforest, [place]   40 mg at 03/12/16 0958  . rosuvastatin (CRESTOR) tablet 10 mg  10 mg Oral QHS Almyra Deforest, [place]   10 mg at 03/12/16 2150  . technetium TC 47M diethylenetriame-pentaacetic acid (DTPA) injection [id number] millicurie  99991111 millicurie Intravenous Once PRN Kalman Jewels, MD      .  tiotropium [facility]) inhalation capsule 18 mcg  18 mcg Inhalation Daily Almyra Deforest, [place]      . vitamin B-12 (CYANOCOBALAMIN) tablet 500 mcg  500 mcg Oral Daily Almyra Deforest, [place]   500 mcg at 03/12/16 0958  . vitamin C (ASCORBIC ACID) tablet 500 mg  500 mg Oral Daily Almyra Deforest, [place]   500 mg at 03/12/16 [id number]    Allergies as of 03/11/2016 - Review Complete 03/11/2016  Allergen Reaction Noted  . Eliquis [apixaban] Other (See Comments) 03/11/2016  . Codeine Other (See Comments)     Family History  Problem Relation Age of Onset  . Liver cancer    .  Heart disease      Social History   Social History  . Marital status: Divorced    Spouse name: N/A  . Number of children: 2  . Years of education: N/A   Occupational History  . retired  Psychologist, prison and probation services   Social History Main Topics  . Smoking status: Former Smoker    Quit date: 04/13/1996  . Smokeless tobacco: Never Used  . Alcohol use No  . Drug use: No  . Sexual activity: Not Currently   Other Topics Concern  . Not on file   Social History Narrative  . No narrative on file    Review of Systems: ROS is O/W negative except as mentioned in HPI.  Physical Exam: Vital signs in last 24 hours: Temp:  [97.4 F (36.3 C)-98.6 F (37 C)] 97.9 F (36.6 C) (12/01 0730) Pulse Rate:  [57-79] 79 (12/01 0730) Resp:  [15-27] 20 (12/01 0730) BP: (95-141)/(43-72) 95/45 (12/01 0730) SpO2:  [92 %-97 %] 97 % (12/01 0730) Last BM Date: 03/12/16 General:  Alert, obese, pleasant and cooperative in NAD Head:  Normocephalic and atraumatic. Eyes:  Sclera clear, no icterus.  Conjunctiva pink. Ears:  Normal auditory acuity. Mouth:  No deformity or lesions.   Lungs:  Clear throughout to auscultation, but does appear somewhat SOB and increased WOB. Heart:  Regular rate and rhythm; no murmurs, clicks, rubs, or gallops. Abdomen:  Soft, obese, non-distended.  BS present.  Non-tender. Rectal:  Large external hemorrhoids seen on exam without active bleeding.  DRE  revealed trace light brown stool but it was hemoccult positive.  Also saw a loose brown stool without blood in the bedside commode that patient had just prior to my rectal exam. Msk:  Symmetrical without gross deformities. Pulses:  Normal pulses noted. Extremities:  Edema noted in B/L LE. Neurologic:  Alert and oriented x 4;  grossly normal neurologically. Skin:  Intact without significant lesions or rashes. Psych:  Alert and cooperative. Normal mood and affect.  Intake/Output from previous day: 11/30 0701 - 12/01 0700 In: 480 [P.O.:480] Out: 300 [Urine:300]  Lab Results:  Recent Labs  03/11/16 1600 03/13/16 0029  WBC 13.3* 8.9  HGB 15.2 13.0  HCT 47.4 40.0  PLT 179 149*   BMET  Recent Labs  03/11/16 1600 03/11/16 1844 03/12/16 0544 03/12/16 1428  NA 138  --  138 136  K 6.9* 5.2* 4.6 4.6  CL 110  --  110 108  CO2 15*  --  20* 20*  GLUCOSE 140*  --  74 95  BUN 26*  --  29* 28*  CREATININE 2.45*  --  1.64* 1.28*  CALCIUM 8.1*  --  8.0* 8.0*   LFT  Recent Labs  03/12/16 1428  PROT 5.9*  ALBUMIN 3.4*  AST 339*  ALT 490*  ALKPHOS 129*  BILITOT 0.3   PT/INR  Recent Labs  03/13/16 0545  LABPROT 15.5*  INR 1.23   Studies/Results: Nm Pulmonary Perf And Vent  Result Date: 03/12/2016 CLINICAL DATA:  Shortness of breath. EXAM: NUCLEAR MEDICINE VENTILATION - PERFUSION LUNG SCAN TECHNIQUE: Ventilation images were obtained in multiple projections using inhaled aerosol Tc-26m DTPA. Perfusion images were obtained in multiple projections after intravenous injection of Tc-51m MAA. RADIOPHARMACEUTICALS:  31.1 mCi Technetium-101m DTPA aerosol inhalation and 4.1 mCi Technetium-42m MAA IV COMPARISON:  PET-CT 02/13/2014. FINDINGS: Small matching ventilation and perfusion defects are noted. Scan is indeterminate for pulmonary embolus. IMPRESSION: Small matching ventilation perfusion defects. Indeterminate scan for pulmonary embolus . Electronically  Signed   By: Marcello Moores  Register    On: 03/12/2016 11:38   Dg Chest Portable 1 View  Result Date: 03/11/2016 CLINICAL DATA:  Flutter feeling in chest today, shortness of breath, diaphoresis, episode of ventricular tachycardia, atrial fibrillation, drop in blood pressure, history COPD, hyperlipidemia, ischemic heart disease, a arrhythmias, hypertension EXAM: PORTABLE CHEST 1 VIEW COMPARISON:  Portable exam 1652 hours compared to 12/19/2013 FINDINGS: Enlargement of cardiac silhouette. Mediastinal contours and pulmonary vascularity normal. Bronchitic changes with scarring at lateral RIGHT lung base. No definite acute infiltrate, pleural effusion or pneumothorax. External pacing lead projects over chest. IMPRESSION: Chronic bronchitic changes and RIGHT basilar scarring. Enlargement of cardiac silhouette. No acute abnormalities. Electronically Signed   By: Lavonia Dana M.D.   On: 03/11/2016 17:11   IMPRESSION:  -Rectal bleeding:  Just had a colonoscopy in 2016 with internal hemorrhoids on that exam.  Hgb normal and stable.  On my exam today he has large external hemorrhoids with no sign of overt bleeding on DRE or by examination of BM, but is hemoccult positive. -WCT concerning for VT:  Cardioverted.  Also with elevated troponin.  Needs cath. -Elevated D-dimer with VQ scan indeterminate for PE.  Has history of PE's in the past.  CTA actually just resulted negative for PE. -Morbid obesity -Elevated LFT's:  No previous for comparison.  Possibly from recent cardiac events/hypoperfusion.    PLAN: -Monitor Hgb and transfuse if needed. -Trend LFT's.  Probably should at least check abdominal ultrasound and viral hepatitis studies but will discuss with Dr. Henrene Pastor. -I doubt need for repeat endoscopic evaluation at this time but will discuss with Dr. Henrene Pastor who will make that decision. -Will order hydrocortisone suppositories BID for hemorrhoids for now to see if that will help.  ZEHR, JESSICA D.  03/13/2016, 10:06 AM Pager number [phone]  GI  ATTENDING  History, laboratories, x-rays, prior colonoscopy report reviewed. Patient personally seen and examined. Agree with comprehensive consultation note as outlined above. Transient minor rectal bleeding as described. Hemodynamically stable. Stable hemoglobin. Suspect hemorrhoidal based on physical exam and other clinical parameters as well as prior colonoscopy results from last year. He does have diverticulosis, but this bleeding was much less severe than typical diverticular bleeds. No suspicion for ischemic colitis. At this point, no contraindication to anticoagulation if needed clinically. Of course, watch for evidence of recurrent bleeding at which point we could reevaluate him. In Terms of his LFTs, likely muscle in origin secondary to cardioversion. I would recommend trending these over time. If liver test abnormalities persist or worsen, then GI reevaluation is recommended. Otherwise, we're available as needed and will sign off at this time. I discussed this with the patient. Thank you  Docia Chuck. Geri Seminole., M.D. [facility] Division of Gastroenterology

## 2016-03-13 NOTE — Progress Notes (Signed)
SUBJECTIVE: No chest pain. No dyspnea.   Tele: sinus with short runs of NSVT  BP (!) 95/45 (BP Location: Right Arm)   Pulse 79   Temp 97.9 F (36.6 C) (Oral)   Resp 20   Ht 5\' 11"  (1.803 m)   Wt 293 lb 4.8 oz (133 kg)   SpO2 97%   BMI 40.91 kg/m   Intake/Output Summary (Last 24 hours) at 03/13/16 0816 Last data filed at 03/12/16 1800  Gross per 24 hour  Intake              480 ml  Output              300 ml  Net              180 ml    PHYSICAL EXAM General: Well developed, well nourished, in no acute distress. Alert and oriented x 3.  Psych:  Good affect, responds appropriately Neck: No JVD. No masses noted.  Lungs: Clear bilaterally with no wheezes or rhonci noted.  Heart: RRR with no murmurs noted. Abdomen: Bowel sounds are present. Soft, non-tender.  Extremities: No lower extremity edema.   LABS: Basic Metabolic Panel:  Recent Labs  03/12/16 0544 03/12/16 1428  NA 138 136  K 4.6 4.6  CL 110 108  CO2 20* 20*  GLUCOSE 74 95  BUN 29* 28*  CREATININE 1.64* 1.28*  CALCIUM 8.0* 8.0*   CBC:  Recent Labs  03/11/16 1600 03/13/16 0029  WBC 13.3* 8.9  NEUTROABS 10.4*  --   HGB 15.2 13.0  HCT 47.4 40.0  MCV 92.2 90.9  PLT 179 149*   Cardiac Enzymes:  Recent Labs  03/11/16 2320 03/12/16 0544 03/12/16 1428  TROPONINI 0.24* 0.28* 0.21*    Current Meds: . aspirin EC  81 mg Oral Daily  . ferrous sulfate  325 mg Oral Daily  . mouth rinse  15 mL Mouth Rinse BID  . metoprolol tartrate  50 mg Oral BID  . pantoprazole  40 mg Oral Daily  . rosuvastatin  10 mg Oral QHS  . tiotropium  18 mcg Inhalation Daily  . vitamin B-12  500 mcg Oral Daily  . vitamin C  500 mg Oral Daily     ASSESSMENT AND PLAN:  1. Wide complex tachycardia: Concern for VT. He has been seen by the EP team. Plans for cardiac cath today to exclude obstructive CAD but he is now having bright red blood per rectum. Likely hemorrhoidal bleeding but this complicates his clinical  picture. He may need to have a coronary stent placed. Will ask GI to see him today. Cancel cath today.  I have discussed this with Dr. Lovena Le. Will delay EP testing until next week. He is back on beta blocker. Verapamil on hold with hypotension. Echo is pending today to assess LV function.   2. Elevated troponin: Flat trend with peak 0.28. Likely demand ischemia in setting of hypotension during period of tachycardia. Given possible VT, we had discussed a cardiac cath. Pt now having bright red blood in his stool. Will postpone cath today.     3. Acute renal failure: In setting of prolonged hypotension. Improving this am.   4. Elevated d-dimer: V/Q scan indeterminate. He has a history of PE. CTA not performed on admission due to renal insufficiency. Pt refused heparin drip due to hemorrhoids and problems with bleeding in the past on Eliquis. He continues to have bright red blood in his stool. Will  cancel cath today. Will proceed with CTA chest today as his creatinine is improved.    5. GI bleeding: Bright red blood per rectum. He is refusing to consider heparin therapy at this time due to prior GI bleeding on Eliquis. We are conducting workup for possible recurrent PE. Also planning for cardiac cath and he may need a coronary stent placed requiring antiplatelet therapy. Will ask GI to see him today.   Lauree Chandler  12/1/20178:16 AM

## 2016-03-13 NOTE — Progress Notes (Signed)
Patient has continued to have bright red blood in stool.  Cardiology MD made aware.  Patient is not anemic per recent CBC.  No new orders given at this time.  RN will continue to monitor patient.    Guadalupe Dawn RN

## 2016-03-13 NOTE — Progress Notes (Signed)
  Echocardiogram 2D Echocardiogram has been performed.  Chad Hogan 03/13/2016, 6:10 PM

## 2016-03-14 LAB — COMPREHENSIVE METABOLIC PANEL
ALK PHOS: 127 U/L — AB (ref 38–126)
ALT: 324 U/L — ABNORMAL HIGH (ref 17–63)
ANION GAP: 6 (ref 5–15)
AST: 144 U/L — ABNORMAL HIGH (ref 15–41)
Albumin: 2.9 g/dL — ABNORMAL LOW (ref 3.5–5.0)
BILIRUBIN TOTAL: 0.5 mg/dL (ref 0.3–1.2)
BUN: 14 mg/dL (ref 6–20)
CALCIUM: 8.4 mg/dL — AB (ref 8.9–10.3)
CO2: 25 mmol/L (ref 22–32)
Chloride: 107 mmol/L (ref 101–111)
Creatinine, Ser: 0.98 mg/dL (ref 0.61–1.24)
GFR calc non Af Amer: >60 mL/min (ref >60)
Glucose, Bld: 92 mg/dL (ref 65–99)
Potassium: 4.2 mmol/L (ref 3.5–5.1)
SODIUM: 138 mmol/L (ref 135–145)
TOTAL PROTEIN: 5.4 g/dL — AB (ref 6.5–8.1)

## 2016-03-14 LAB — ECHOCARDIOGRAM COMPLETE
E decel time: 243 msec
EERAT: 11.32
FS: 36 % (ref 28–44)
HEIGHTINCHES: 71 in
IV/PV OW: 0.81
LA diam end sys: 39 mm
LA diam index: 1.57 cm/m2
LA vol A4C: 57 ml
LA vol index: 27.3 mL/m2
LA vol: 67.6 mL
LASIZE: 39 mm
LDCA: 3.46 cm2
LV E/e'average: 11.32
LV TDI E'MEDIAL: 6.74
LVEEMED: 11.32
LVELAT: 8.38 cm/s
LVOT diameter: 21 mm
MV Dec: 243
MV pk E vel: 94.9 m/s
MVPG: 4 mmHg
MVPKAVEL: 119 m/s
PW: 11.8 mm — AB (ref 0.6–1.1)
RV LATERAL S' VELOCITY: 16 cm/s
RV TAPSE: 25.4 mm
TDI e' lateral: 8.38
WEIGHTICAEL: 4692.8 [oz_av]

## 2016-03-14 LAB — CBC
HEMATOCRIT: 39.2 % (ref 39.0–52.0)
HEMOGLOBIN: 12.7 g/dL — AB (ref 13.0–17.0)
MCH: 29.3 pg (ref 26.0–34.0)
MCHC: 32.4 g/dL (ref 30.0–36.0)
MCV: 90.5 fL (ref 78.0–100.0)
Platelets: 159 10*3/uL (ref 150–400)
RBC: 4.33 MIL/uL (ref 4.22–5.81)
RDW: 13.2 % (ref 11.5–15.5)
WBC: 8.5 10*3/uL (ref 4.0–10.5)

## 2016-03-14 NOTE — Progress Notes (Signed)
Patient Name: Chad Hogan Date of Encounter: 03/14/2016     Active Problems:   Bradycardia   Ventricular tachycardia ([place])    SUBJECTIVE    CURRENT MEDS . aspirin EC  81 mg Oral Daily  . ferrous sulfate  325 mg Oral Daily  . hydrocortisone  25 mg Rectal BID  . mouth rinse  15 mL Mouth Rinse BID  . metoprolol tartrate  50 mg Oral BID  . pantoprazole  40 mg Oral Daily  . rosuvastatin  10 mg Oral QHS  . tiotropium  18 mcg Inhalation Daily  . vitamin B-12  500 mcg Oral Daily  . vitamin C  500 mg Oral Daily    OBJECTIVE  Vitals:   03/14/16 0400 03/14/16 0734 03/14/16 0800 03/14/16 1110  BP: 113/77 (!) 113/58 137/72 (!) 126/104  Pulse: 60 60 71 61  Resp: 18 (!) 21 18 16   Temp:  98 F (36.7 C)  97.9 F (36.6 C)  TempSrc:  Oral  Oral  SpO2: 96% 90% 93% 94%  Weight:      Height:        Intake/Output Summary (Last 24 hours) at 03/14/16 1216 Last data filed at 03/14/16 1100  Gross per 24 hour  Intake          1171.67 ml  Output             1350 ml  Net          -178.33 ml   Filed Weights   03/11/16 1637 03/12/16 0358  Weight: 287 lb (130.2 kg) 293 lb 4.8 oz (133 kg)    PHYSICAL EXAM  General: Pleasant, obese, diskempt appearing, NAD. Neuro: Alert and oriented X 3. Moves all extremities spontaneously. Psych: Normal affect. HEENT:  Normal  Neck: Supple without bruits or JVD. Lungs:  Resp regular and unlabored, CTA. Heart: RRR no s3, s4, or murmurs. Abdomen: Soft, non-tender, non-distended, BS + x 4.  Extremities: No clubbing, cyanosis or edema. DP/PT/Radials 2+ and equal bilaterally.  Accessory Clinical Findings  CBC  Recent Labs  03/11/16 1600 03/13/16 0029 03/14/16 0330  WBC 13.3* 8.9 8.5  NEUTROABS 10.4*  --   --   HGB 15.2 13.0 12.7*  HCT 47.4 40.0 39.2  MCV 92.2 90.9 90.5  PLT 179 149* [id number]   Basic Metabolic Panel  Recent Labs  03/12/16 1428 03/14/16 0330  NA 136 138  K 4.6 4.2  CL 108 107  CO2 20* 25  GLUCOSE 95 92  BUN 28*  14  CREATININE 1.28* 0.98  CALCIUM 8.0* 8.4*   Liver Function Tests  Recent Labs  03/12/16 1428 03/14/16 0330  AST 339* 144*  ALT 490* 324*  ALKPHOS 129* 127*  BILITOT 0.3 0.5  PROT 5.9* 5.4*  ALBUMIN 3.4* 2.9*   No results for input(s): LIPASE, AMYLASE in the last 72 hours. Cardiac Enzymes  Recent Labs  03/11/16 2320 03/12/16 0544 03/12/16 1428  TROPONINI 0.24* 0.28* 0.21*   BNP Invalid input(s): POCBNP D-Dimer  Recent Labs  03/11/16 1600  DDIMER 1.18*   Hemoglobin A1C No results for input(s): HGBA1C in the last 72 hours. Fasting Lipid Panel No results for input(s): CHOL, HDL, LDLCALC, TRIG, CHOLHDL, LDLDIRECT in the last 72 hours. Thyroid Function Tests No results for input(s): TSH, T4TOTAL, T3FREE, THYROIDAB in the last 72 hours.  Invalid input(s): FREET3  TELE  nsr  Radiology/Studies  Ct Angio Chest Pe W Or Wo Contrast  Result Date: 03/13/2016 CLINICAL DATA:  Elevated D-dimer and shortness of breath. EXAM: CT ANGIOGRAPHY CHEST WITH CONTRAST TECHNIQUE: Multidetector CT imaging of the chest was performed using the standard protocol during bolus administration of intravenous contrast. Multiplanar CT image reconstructions and MIPs were obtained to evaluate the vascular anatomy. CONTRAST:  77 cc Isovue 370 intravenous COMPARISON:  01/04/2014 FINDINGS: Cardiovascular: Satisfactory opacification of the pulmonary arteries to the segmental level, but limited by motion, especially at the distal segmental level in the lower lobes. No evidence of pulmonary embolism. Borderline cardiomegaly. No pericardial effusion. Prominent size of the right heart and central pulmonary arteries which could reflect pulmonary hypertension in this patient with COPD history. Coronary atherosclerotic calcifications seen along the left main. Mediastinum/Nodes: Negative for mass. Stable if not diminished hilar and subcarinal lymph nodes size. Lungs/Pleura: COPD with centrilobular emphysema and  generalized airway thickening. Subpleural reticulation at the right more than left bases is stable, including small cystic spaces in the subpleural right middle lobe. Stable pulmonary nodule in the right upper lobe, 6:39, 3 mm in average diameter. There are peripheral angular appearing nodular densities in the right lower lobe on 6:91 and 99, appearance similar to prior (especially PET-CT 02/13/2014) and favored from scarring. Upper Abdomen: No acute finding. Presumed 1 cm cyst exophytic from the upper pole right kidney. Musculoskeletal: No acute or aggressive finding. Review of the MIP images confirms the above findings. IMPRESSION: 1. Motion degraded study without evidence of pulmonary embolism. 2. COPD with emphysema and airway thickening. 3. Chronic scarring and nodularity when compared to 2015. Electronically Signed   By: Monte Fantasia M.D.   On: 03/13/2016 11:44   Nm Pulmonary Perf And Vent  Result Date: 03/12/2016 CLINICAL DATA:  Shortness of breath. EXAM: NUCLEAR MEDICINE VENTILATION - PERFUSION LUNG SCAN TECHNIQUE: Ventilation images were obtained in multiple projections using inhaled aerosol Tc-66m DTPA. Perfusion images were obtained in multiple projections after intravenous injection of Tc-4m MAA. RADIOPHARMACEUTICALS:  31.1 mCi Technetium-56m DTPA aerosol inhalation and 4.1 mCi Technetium-82m MAA IV COMPARISON:  PET-CT 02/13/2014. FINDINGS: Small matching ventilation and perfusion defects are noted. Scan is indeterminate for pulmonary embolus. IMPRESSION: Small matching ventilation perfusion defects. Indeterminate scan for pulmonary embolus . Electronically Signed   By: Marcello Moores  Register   On: 03/12/2016 11:38   Dg Chest Portable 1 View  Result Date: 03/11/2016 CLINICAL DATA:  Flutter feeling in chest today, shortness of breath, diaphoresis, episode of ventricular tachycardia, atrial fibrillation, drop in blood pressure, history COPD, hyperlipidemia, ischemic heart disease, a arrhythmias,  hypertension EXAM: PORTABLE CHEST 1 VIEW COMPARISON:  Portable exam 1652 hours compared to 12/19/2013 FINDINGS: Enlargement of cardiac silhouette. Mediastinal contours and pulmonary vascularity normal. Bronchitic changes with scarring at lateral RIGHT lung base. No definite acute infiltrate, pleural effusion or pneumothorax. External pacing lead projects over chest. IMPRESSION: Chronic bronchitic changes and RIGHT basilar scarring. Enlargement of cardiac silhouette. No acute abnormalities. Electronically Signed   By: Lavonia Dana M.D.   On: 03/11/2016 17:11    ASSESSMENT AND PLAN  1. Wide QRS tachycardia - he remains stable. Await evaluation of LV function and coronaries 2. GI bleeding - appreciate Dr. Blanch Media consult. He states that the patient is stable to undergo blood thinners if needed during cardiac cath. Will place on the schedule. No evidence of additional bleeding 3. Morbid obesity - he will need to lose weight.  4. Elevated d-dimer and troponin - CT scan was negative. Cath next week.  Carleene Overlie Taylor,M.D.  03/14/2016 12:16 PMPatient ID: Adela Lank, male   DOB:  1941-03-09, 75 y.o.   MRN: [id number]

## 2016-03-15 LAB — CBC
HCT: 42.1 % (ref 39.0–52.0)
Hemoglobin: 13.6 g/dL (ref 13.0–17.0)
MCH: 29.6 pg (ref 26.0–34.0)
MCHC: 32.3 g/dL (ref 30.0–36.0)
MCV: 91.7 fL (ref 78.0–100.0)
PLATELETS: 192 10*3/uL (ref 150–400)
RBC: 4.59 MIL/uL (ref 4.22–5.81)
RDW: 13.5 % (ref 11.5–15.5)
WBC: 11.1 10*3/uL — AB (ref 4.0–10.5)

## 2016-03-15 MED ORDER — SODIUM CHLORIDE 0.9% FLUSH: 3.0000 mL | INTRAVENOUS | Status: DC | PRN

## 2016-03-15 MED ORDER — ASPIRIN EC 81 MG PO TBEC
81.0000 mg | DELAYED_RELEASE_TABLET | Freq: Every day | ORAL | Status: DC
  Administered 2016-03-17: 81 mg via ORAL
  Filled 2016-03-15: qty 1

## 2016-03-15 MED ORDER — SODIUM CHLORIDE 0.9 % WEIGHT BASED INFUSION
1.0000 mL/kg/h | INTRAVENOUS | Status: DC
  Administered 2016-03-16 (×2): 1 mL/kg/h via INTRAVENOUS

## 2016-03-15 MED ORDER — SODIUM CHLORIDE 0.9 % WEIGHT BASED INFUSION
3.0000 mL/kg/h | INTRAVENOUS | Status: DC
  Administered 2016-03-16: 3 mL/kg/h via INTRAVENOUS

## 2016-03-15 MED ORDER — SODIUM CHLORIDE 0.9% FLUSH
3.0000 mL | Freq: Two times a day (BID) | INTRAVENOUS | Status: DC
  Administered 2016-03-15 – 2016-03-16 (×2): 3 mL via INTRAVENOUS

## 2016-03-15 MED ORDER — ASPIRIN 81 MG PO CHEW
81.0000 mg | CHEWABLE_TABLET | ORAL | Status: AC
  Administered 2016-03-16: 81 mg via ORAL
  Filled 2016-03-15: qty 1

## 2016-03-15 MED ORDER — SODIUM CHLORIDE 0.9 % IV SOLN: 250.0000 mL | INTRAVENOUS | Status: DC | PRN

## 2016-03-15 NOTE — Progress Notes (Signed)
Patient Name: Chad Hogan Date of Encounter: 03/15/2016  Primary Cardiologist: [facility] Problem List     Active Problems:   Bradycardia   Ventricular tachycardia ([place])     Subjective   Had hematochezia today, sounds like maybe a hemorrhoidal bleeding event. No hemodynamic changes. Occasional 6-7 beat episodes of WCT, no sustained events  Inpatient Medications    Scheduled Meds: . aspirin EC  81 mg Oral Daily  . ferrous sulfate  325 mg Oral Daily  . hydrocortisone  25 mg Rectal BID  . mouth rinse  15 mL Mouth Rinse BID  . metoprolol tartrate  50 mg Oral BID  . pantoprazole  40 mg Oral Daily  . rosuvastatin  10 mg Oral QHS  . tiotropium  18 mcg Inhalation Daily  . vitamin B-12  500 mcg Oral Daily  . vitamin C  500 mg Oral Daily   Continuous Infusions:  PRN Meds: acetaminophen, ALPRAZolam, nitroGLYCERIN, ondansetron (ZOFRAN) IV, technetium TC 59M diethylenetriame-pentaacetic acid   Vital Signs    Vitals:   03/14/16 2300 03/15/16 0438 03/15/16 0728 03/15/16 0748  BP: 99/66 132/76 131/84   Pulse: 66 (!) 56 (!) 59   Resp: 19 17 (!) 21   Temp: 97.9 F (36.6 C) 97.6 F (36.4 C) 98.2 F (36.8 C)   TempSrc: Oral Oral Oral   SpO2: 94% 94% 94% 93%  Weight:      Height:        Intake/Output Summary (Last 24 hours) at 03/15/16 1101 Last data filed at 03/15/16 0440  Gross per 24 hour  Intake                0 ml  Output              675 ml  Net             -675 ml   Filed Weights   03/11/16 1637 03/12/16 0358  Weight: 287 lb (130.2 kg) 293 lb 4.8 oz (133 kg)    Physical Exam   Obese GEN: Well nourished, well developed, in no acute distress.  HEENT: Grossly normal.  Neck: Supple, no JVD, carotid bruits, or masses. Cardiac: RRR, no murmurs, rubs, or gallops. No clubbing, cyanosis, edema.  Radials/DP/PT 2+ and equal bilaterally.  Respiratory:  Respirations regular and unlabored, clear to auscultation bilaterally. GI: Soft, nontender, nondistended, BS +  x 4. MS: no deformity or atrophy. Skin: warm and dry, no rash. Neuro:  Strength and sensation are intact. Psych: AAOx3.  Normal affect.  Labs    CBC  Recent Labs  03/13/16 0029 03/14/16 0330  WBC 8.9 8.5  HGB 13.0 12.7*  HCT 40.0 39.2  MCV 90.9 90.5  PLT 149* [id number]   Basic Metabolic Panel  Recent Labs  03/12/16 1428 03/14/16 0330  NA 136 138  K 4.6 4.2  CL 108 107  CO2 20* 25  GLUCOSE 95 92  BUN 28* 14  CREATININE 1.28* 0.98  CALCIUM 8.0* 8.4*   Liver Function Tests  Recent Labs  03/12/16 1428 03/14/16 0330  AST 339* 144*  ALT 490* 324*  ALKPHOS 129* 127*  BILITOT 0.3 0.5  PROT 5.9* 5.4*  ALBUMIN 3.4* 2.9*   No results for input(s): LIPASE, AMYLASE in the last 72 hours. Cardiac Enzymes  Recent Labs  03/12/16 1428  TROPONINI 0.21*    Telemetry    NSR. Occasional 6-7 beat episodes of WCT, no sustained events - Personally Reviewed  ECG  SBrady, no preexcitation - Personally Reviewed  Radiology    Ct Angio Chest Pe W Or Wo Contrast  Result Date: 03/13/2016 CLINICAL DATA:  Elevated D-dimer and shortness of breath. EXAM: CT ANGIOGRAPHY CHEST WITH CONTRAST TECHNIQUE: Multidetector CT imaging of the chest was performed using the standard protocol during bolus administration of intravenous contrast. Multiplanar CT image reconstructions and MIPs were obtained to evaluate the vascular anatomy. CONTRAST:  77 cc Isovue 370 intravenous COMPARISON:  01/04/2014 FINDINGS: Cardiovascular: Satisfactory opacification of the pulmonary arteries to the segmental level, but limited by motion, especially at the distal segmental level in the lower lobes. No evidence of pulmonary embolism. Borderline cardiomegaly. No pericardial effusion. Prominent size of the right heart and central pulmonary arteries which could reflect pulmonary hypertension in this patient with COPD history. Coronary atherosclerotic calcifications seen along the left main. Mediastinum/Nodes: Negative for  mass. Stable if not diminished hilar and subcarinal lymph nodes size. Lungs/Pleura: COPD with centrilobular emphysema and generalized airway thickening. Subpleural reticulation at the right more than left bases is stable, including small cystic spaces in the subpleural right middle lobe. Stable pulmonary nodule in the right upper lobe, 6:39, 3 mm in average diameter. There are peripheral angular appearing nodular densities in the right lower lobe on 6:91 and 99, appearance similar to prior (especially PET-CT 02/13/2014) and favored from scarring. Upper Abdomen: No acute finding. Presumed 1 cm cyst exophytic from the upper pole right kidney. Musculoskeletal: No acute or aggressive finding. Review of the MIP images confirms the above findings. IMPRESSION: 1. Motion degraded study without evidence of pulmonary embolism. 2. COPD with emphysema and airway thickening. 3. Chronic scarring and nodularity when compared to 2015. Electronically Signed   By: Monte Fantasia M.D.   On: 03/13/2016 11:44    Cardiac Studies   - Left ventricle: The cavity size was normal. Wall thickness was   normal. Systolic function was normal. The estimated ejection   fraction was in the range of 60% to 65%. Wall motion was normal;   there were no regional wall motion abnormalities. Doppler   parameters are consistent with abnormal left ventricular   relaxation (grade 1 diastolic dysfunction). The E/e&' ratio is   between 8-15, suggesting indeterminate LV filling pressure. - Left atrium: The atrium was normal in size. - Inferior vena cava: The vessel was normal in size. The   respirophasic diameter changes were in the normal range (>= 50%),   consistent with normal central venous pressure.   Patient Profile     75 y.o. 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 Wide QRS tachycardia - possible VT; he remains stable. Normal EF on echo is reassuring. Await evaluation of coronaries, possible EPS to follow 2. GI bleeding - had recurrent GI bleeding today. Recheck HGb today. Per Dr. Henrene Pastor evaluation, the patient is stable to undergo blood thinners if needed during cardiac cath. Will place on the schedule. Depending on cath findings and any changes in H/H, may decide on immediate or delayed revascularization 3. Morbid obesity - he will need to lose weight.  4. Elevated d-dimer and troponin - CT scan was negative. Cath next week. 5. ARF: rapidly improving, now normal  Signed, Sanda Klein, MD  03/15/2016, 11:01 AM

## 2016-03-15 NOTE — Progress Notes (Signed)
GI called and request for re consult concerning bloody stool this morning.  Spoke with physician on call, please call GI if patient continues to have bloody stools, continue to do suppository at this time concern for hemorrhoids.

## 2016-03-16 ENCOUNTER — Encounter (HOSPITAL_COMMUNITY)
Admission: EM | Disposition: A | Discharge status: Home / Self Care | Payer: Self-pay | Source: Home / Self Care | Attending: Cardiovascular Disease

## 2016-03-16 DIAGNOSIS — R001 Bradycardia, unspecified: Secondary | ICD-10-CM

## 2016-03-16 HISTORY — PX: CARDIAC CATHETERIZATION: SHX172

## 2016-03-16 LAB — MAGNESIUM: Magnesium: 2 mg/dL (ref 1.7–2.4)

## 2016-03-16 SURGERY — LEFT HEART CATH AND CORONARY ANGIOGRAPHY
Anesthesia: LOCAL

## 2016-03-16 MED ORDER — SODIUM CHLORIDE 0.9 % IV SOLN: 250.0000 mL | INTRAVENOUS | Status: DC | PRN

## 2016-03-16 MED ORDER — LIDOCAINE HCL (PF) 1 % IJ SOLN
INTRAMUSCULAR | Status: DC | PRN
  Administered 2016-03-16: 3 mL

## 2016-03-16 MED ORDER — VERAPAMIL HCL 2.5 MG/ML IV SOLN
INTRAVENOUS | Status: AC
Start: 2016-03-16 — End: 2016-03-16
  Filled 2016-03-16: qty 2

## 2016-03-16 MED ORDER — IOPAMIDOL (ISOVUE-370) INJECTION 76%
INTRAVENOUS | Status: AC
  Filled 2016-03-16: qty 100

## 2016-03-16 MED ORDER — HEPARIN SODIUM (PORCINE) 1000 UNIT/ML IJ SOLN
INTRAMUSCULAR | Status: AC
  Filled 2016-03-16: qty 1

## 2016-03-16 MED ORDER — ALPRAZOLAM 0.5 MG PO TABS
1.0000 mg | ORAL_TABLET | Freq: Once | ORAL | Status: AC
  Administered 2016-03-16: 1 mg via ORAL
  Filled 2016-03-16: qty 2

## 2016-03-16 MED ORDER — HEPARIN (PORCINE) IN NACL 2-0.9 UNIT/ML-% IJ SOLN
INTRAMUSCULAR | Status: AC
  Filled 2016-03-16: qty 1000

## 2016-03-16 MED ORDER — ACETAMINOPHEN 325 MG PO TABS: 650.0000 mg | ORAL_TABLET | ORAL | Status: DC | PRN

## 2016-03-16 MED ORDER — MIDAZOLAM HCL 2 MG/2ML IJ SOLN
INTRAMUSCULAR | Status: DC | PRN
  Administered 2016-03-16: 2 mg via INTRAVENOUS

## 2016-03-16 MED ORDER — FENTANYL CITRATE (PF) 100 MCG/2ML IJ SOLN
INTRAMUSCULAR | Status: AC
  Filled 2016-03-16: qty 2

## 2016-03-16 MED ORDER — SODIUM CHLORIDE 0.9% FLUSH
3.0000 mL | Freq: Two times a day (BID) | INTRAVENOUS | Status: DC
  Administered 2016-03-16: 3 mL via INTRAVENOUS

## 2016-03-16 MED ORDER — HEPARIN SODIUM (PORCINE) 1000 UNIT/ML IJ SOLN
INTRAMUSCULAR | Status: DC | PRN
  Administered 2016-03-16: 6500 [IU] via INTRAVENOUS

## 2016-03-16 MED ORDER — LIDOCAINE HCL (PF) 1 % IJ SOLN
INTRAMUSCULAR | Status: AC
Start: 2016-03-16 — End: 2016-03-16
  Filled 2016-03-16: qty 30

## 2016-03-16 MED ORDER — SODIUM CHLORIDE 0.9 % WEIGHT BASED INFUSION
1.0000 mL/kg/h | INTRAVENOUS | Status: AC
  Administered 2016-03-16: 1 mL/kg/h via INTRAVENOUS

## 2016-03-16 MED ORDER — SODIUM CHLORIDE 0.9% FLUSH: 3.0000 mL | INTRAVENOUS | Status: DC | PRN

## 2016-03-16 MED ORDER — DIAZEPAM 5 MG PO TABS: 5.0000 mg | ORAL_TABLET | ORAL | Status: DC | PRN

## 2016-03-16 MED ORDER — HEPARIN (PORCINE) IN NACL 2-0.9 UNIT/ML-% IJ SOLN
INTRAMUSCULAR | Status: DC | PRN
  Administered 2016-03-16: 10 mL via INTRA_ARTERIAL

## 2016-03-16 MED ORDER — IOPAMIDOL (ISOVUE-370) INJECTION 76%
INTRAVENOUS | Status: DC | PRN
  Administered 2016-03-16: 40 mL via INTRA_ARTERIAL

## 2016-03-16 MED ORDER — HEPARIN (PORCINE) IN NACL 2-0.9 UNIT/ML-% IJ SOLN
INTRAMUSCULAR | Status: DC | PRN
  Administered 2016-03-16: 1000 mL

## 2016-03-16 MED ORDER — FENTANYL CITRATE (PF) 100 MCG/2ML IJ SOLN
INTRAMUSCULAR | Status: DC | PRN
  Administered 2016-03-16: 50 ug via INTRAVENOUS

## 2016-03-16 MED ORDER — MIDAZOLAM HCL 2 MG/2ML IJ SOLN
INTRAMUSCULAR | Status: AC
Start: 2016-03-16 — End: 2016-03-16
  Filled 2016-03-16: qty 2

## 2016-03-16 MED ORDER — ONDANSETRON HCL 4 MG/2ML IJ SOLN: 4.0000 mg | Freq: Four times a day (QID) | INTRAMUSCULAR | Status: DC | PRN

## 2016-03-16 SURGICAL SUPPLY — 10 items
CATH INFINITI JR4 5F (CATHETERS) ×1 IMPLANT
CATH OPTITORQUE TIG 4.0 5F (CATHETERS) ×1 IMPLANT
DEVICE RAD COMP TR BAND LRG (VASCULAR PRODUCTS) ×1 IMPLANT
GLIDESHEATH SLEND SS 6F .021 (SHEATH) ×1 IMPLANT
GUIDEWIRE INQWIRE 1.5J.035X260 (WIRE) IMPLANT
INQWIRE 1.5J .035X260CM (WIRE) ×2
KIT HEART LEFT (KITS) ×2 IMPLANT
PACK CARDIAC CATHETERIZATION (CUSTOM PROCEDURE TRAY) ×2 IMPLANT
TRANSDUCER W/STOPCOCK (MISCELLANEOUS) ×2 IMPLANT
TUBING CIL FLEX 10 FLL-RA (TUBING) ×2 IMPLANT

## 2016-03-16 NOTE — Progress Notes (Signed)
SUBJECTIVE: The patient is doing well today.  At this time, he denies chest pain, shortness of breath, or any new concerns.  Derrill Memo ON 03/17/2016] aspirin EC  81 mg Oral Daily  . ferrous sulfate  325 mg Oral Daily  . hydrocortisone  25 mg Rectal BID  . mouth rinse  15 mL Mouth Rinse BID  . metoprolol tartrate  50 mg Oral BID  . pantoprazole  40 mg Oral Daily  . rosuvastatin  10 mg Oral QHS  . sodium chloride flush  3 mL Intravenous Q12H  . tiotropium  18 mcg Inhalation Daily  . vitamin B-12  500 mcg Oral Daily  . vitamin C  500 mg Oral Daily   . sodium chloride      OBJECTIVE: Physical Exam: Vitals:   03/15/16 1938 03/15/16 2000 03/15/16 2316 03/16/16 0325  BP: 135/70 117/61 127/64 116/69  Pulse: 62 63 65 (!) 57  Resp: 14 (!) 21 (!) 25 (!) 24  Temp: 97.9 F (36.6 C)  97.8 F (36.6 C) 98.2 F (36.8 C)  TempSrc: Oral  Oral Oral  SpO2: 94% 93% 95% 96%  Weight:      Height:        Intake/Output Summary (Last 24 hours) at 03/16/16 0724 Last data filed at 03/16/16 0700  Gross per 24 hour  Intake             1046 ml  Output             1125 ml  Net              -79 ml    Telemetry is reviewed today by myself, SB/SR 50's-60's generally, occ PVC's, NSVT up to 11 beats on one occasion in the last 24 hours   GEN- The patient is well appearing, alert and oriented x 3 today.   Head- normocephalic, atraumatic Eyes-  Sclera clear, conjunctiva pink Ears- hearing intact Oropharynx- clear Neck- supple, no JVP Lungs- Clear to ausculation bilaterally, normal work of breathing Heart- Regular rate and rhythm, no significant murmurs, no rubs or gallops GI- soft, NT, ND Extremities- no clubbing, cyanosis, no pitting edema Skin- no rash or lesion Psych- euthymic mood, full affect Neuro- no gross deficits appreciated  LABS: Basic Metabolic Panel:  Recent Labs  03/14/16 0330  NA 138  K 4.2  CL 107  CO2 25  GLUCOSE 92  BUN 14  CREATININE 0.98  CALCIUM 8.4*   Liver  Function Tests:  Recent Labs  03/14/16 0330  AST 144*  ALT 324*  ALKPHOS 127*  BILITOT 0.5  PROT 5.4*  ALBUMIN 2.9*   CBC:  Recent Labs  03/14/16 0330 03/15/16 1652  WBC 8.5 11.1*  HGB 12.7* 13.6  HCT 39.2 42.1  MCV 90.5 91.7  PLT 159 192    RADIOLOGY: Ct Angio Chest Pe W Or Wo Contrast Result Date: 03/13/2016 CLINICAL DATA:  Elevated D-dimer and shortness of breath. EXAM: CT ANGIOGRAPHY CHEST WITH CONTRAST TECHNIQUE: Multidetector CT imaging of the chest was performed using the standard protocol during bolus administration of intravenous contrast. Multiplanar CT image reconstructions and MIPs were obtained to evaluate the vascular anatomy. CONTRAST:  77 cc Isovue 370 intravenous COMPARISON:  01/04/2014 FINDINGS: Cardiovascular: Satisfactory opacification of the pulmonary arteries to the segmental level, but limited by motion, especially at the distal segmental level in the lower lobes. No evidence of pulmonary embolism. Borderline cardiomegaly. No pericardial effusion. Prominent size of the right heart and central  pulmonary arteries which could reflect pulmonary hypertension in this patient with COPD history. Coronary atherosclerotic calcifications seen along the left main. Mediastinum/Nodes: Negative for mass. Stable if not diminished hilar and subcarinal lymph nodes size. Lungs/Pleura: COPD with centrilobular emphysema and generalized airway thickening. Subpleural reticulation at the right more than left bases is stable, including small cystic spaces in the subpleural right middle lobe. Stable pulmonary nodule in the right upper lobe, 6:39, 3 mm in average diameter. There are peripheral angular appearing nodular densities in the right lower lobe on 6:91 and 99, appearance similar to prior (especially PET-CT 02/13/2014) and favored from scarring. Upper Abdomen: No acute finding. Presumed 1 cm cyst exophytic from the upper pole right kidney. Musculoskeletal: No acute or aggressive  finding. Review of the MIP images confirms the above findings. IMPRESSION: 1. Motion degraded study without evidence of pulmonary embolism. 2. COPD with emphysema and airway thickening. 3. Chronic scarring and nodularity when compared to 2015. Electronically Signed   By: Monte Fantasia M.D.   On: 03/13/2016 11:44     ASSESSMENT AND PLAN:   1. WCT (EMS found hypotensive and emergently cardioverted)     Non-sustained episodes continue     Echo this admission with LVEF 60-65%     Pending LHC today     Hx of SVT w/WPW, notes report loss of antegrade conduction, followed out patient on CCB/BB      Seen by Dr. Lovena Le over the weekend, pending cath, may need EPS +/- ablation      Will check mag level, K+ OK  2. LGIB      (felt to be minor and hermorrhoids)     OK to pursue cardiac w/u and anticoag/antiplatlet if needed as per GI consult  3. Obesity  4. Bradycardia     Presented in junctional rhythm 40's     SB/SR since     On Metoprolol 50mg  BID (off home verapamil 240mg  daily)  Tommye Standard, PA-C 03/16/2016 7:24 AM  Pt seen and exqamined  ECG  personnnalyu revieiwed looks most like VT but with EF normal its hard to know Await cath and tehn will anticipate EPS

## 2016-03-16 NOTE — Progress Notes (Signed)
Cath --  Non reveealing Leaves [place] with the question as to the mechanism of theWCT Will plan EPS and RFCA in am Spoke with Dr Elliot Cousin who will perform and see pt in am

## 2016-03-16 NOTE — Interval H&P Note (Signed)
Cath Lab Visit (complete for each Cath Lab visit)  Clinical Evaluation Leading to the Procedure:   ACS: No.  Non-ACS:    Anginal Classification: CCS III  Anti-ischemic medical therapy: Minimal Therapy (1 class of medications)  Non-Invasive Test Results: No non-invasive testing performed  Prior CABG: No previous CABG      History and Physical Interval Note:  03/16/2016 5:07 PM  Chad Hogan  has presented today for surgery, with the diagnosis of SOB  The various methods of treatment have been discussed with the patient and family. After consideration of risks, benefits and other options for treatment, the patient has consented to  Procedure(s): Left Heart Cath and Coronary Angiography (N/A) as a surgical intervention .  The patient's history has been reviewed, patient examined, no change in status, stable for surgery.  I have reviewed the patient's chart and labs.  Questions were answered to the patient's satisfaction.     Shelva Majestic

## 2016-03-16 NOTE — H&P (View-Only) (Signed)
SUBJECTIVE: The patient is doing well today.  At this time, he denies chest pain, shortness of breath, or any new concerns.  Derrill Memo ON 03/17/2016] aspirin EC  81 mg Oral Daily  . ferrous sulfate  325 mg Oral Daily  . hydrocortisone  25 mg Rectal BID  . mouth rinse  15 mL Mouth Rinse BID  . metoprolol tartrate  50 mg Oral BID  . pantoprazole  40 mg Oral Daily  . rosuvastatin  10 mg Oral QHS  . sodium chloride flush  3 mL Intravenous Q12H  . tiotropium  18 mcg Inhalation Daily  . vitamin B-12  500 mcg Oral Daily  . vitamin C  500 mg Oral Daily   . sodium chloride      OBJECTIVE: Physical Exam: Vitals:   03/15/16 1938 03/15/16 2000 03/15/16 2316 03/16/16 0325  BP: 135/70 117/61 127/64 116/69  Pulse: 62 63 65 (!) 57  Resp: 14 (!) 21 (!) 25 (!) 24  Temp: 97.9 F (36.6 C)  97.8 F (36.6 C) 98.2 F (36.8 C)  TempSrc: Oral  Oral Oral  SpO2: 94% 93% 95% 96%  Weight:      Height:        Intake/Output Summary (Last 24 hours) at 03/16/16 0724 Last data filed at 03/16/16 0700  Gross per 24 hour  Intake             1046 ml  Output             1125 ml  Net              -79 ml    Telemetry is reviewed today by myself, SB/SR 50's-60's generally, occ PVC's, NSVT up to 11 beats on one occasion in the last 24 hours   GEN- The patient is well appearing, alert and oriented x 3 today.   Head- normocephalic, atraumatic Eyes-  Sclera clear, conjunctiva pink Ears- hearing intact Oropharynx- clear Neck- supple, no JVP Lungs- Clear to ausculation bilaterally, normal work of breathing Heart- Regular rate and rhythm, no significant murmurs, no rubs or gallops GI- soft, NT, ND Extremities- no clubbing, cyanosis, no pitting edema Skin- no rash or lesion Psych- euthymic mood, full affect Neuro- no gross deficits appreciated  LABS: Basic Metabolic Panel:  Recent Labs  03/14/16 0330  NA 138  K 4.2  CL 107  CO2 25  GLUCOSE 92  BUN 14  CREATININE 0.98  CALCIUM 8.4*   Liver  Function Tests:  Recent Labs  03/14/16 0330  AST 144*  ALT 324*  ALKPHOS 127*  BILITOT 0.5  PROT 5.4*  ALBUMIN 2.9*   CBC:  Recent Labs  03/14/16 0330 03/15/16 1652  WBC 8.5 11.1*  HGB 12.7* 13.6  HCT 39.2 42.1  MCV 90.5 91.7  PLT 159 192    RADIOLOGY: Ct Angio Chest Pe W Or Wo Contrast Result Date: 03/13/2016 CLINICAL DATA:  Elevated D-dimer and shortness of breath. EXAM: CT ANGIOGRAPHY CHEST WITH CONTRAST TECHNIQUE: Multidetector CT imaging of the chest was performed using the standard protocol during bolus administration of intravenous contrast. Multiplanar CT image reconstructions and MIPs were obtained to evaluate the vascular anatomy. CONTRAST:  77 cc Isovue 370 intravenous COMPARISON:  01/04/2014 FINDINGS: Cardiovascular: Satisfactory opacification of the pulmonary arteries to the segmental level, but limited by motion, especially at the distal segmental level in the lower lobes. No evidence of pulmonary embolism. Borderline cardiomegaly. No pericardial effusion. Prominent size of the right heart and central  pulmonary arteries which could reflect pulmonary hypertension in this patient with COPD history. Coronary atherosclerotic calcifications seen along the left main. Mediastinum/Nodes: Negative for mass. Stable if not diminished hilar and subcarinal lymph nodes size. Lungs/Pleura: COPD with centrilobular emphysema and generalized airway thickening. Subpleural reticulation at the right more than left bases is stable, including small cystic spaces in the subpleural right middle lobe. Stable pulmonary nodule in the right upper lobe, 6:39, 3 mm in average diameter. There are peripheral angular appearing nodular densities in the right lower lobe on 6:91 and 99, appearance similar to prior (especially PET-CT 02/13/2014) and favored from scarring. Upper Abdomen: No acute finding. Presumed 1 cm cyst exophytic from the upper pole right kidney. Musculoskeletal: No acute or aggressive  finding. Review of the MIP images confirms the above findings. IMPRESSION: 1. Motion degraded study without evidence of pulmonary embolism. 2. COPD with emphysema and airway thickening. 3. Chronic scarring and nodularity when compared to 2015. Electronically Signed   By: Monte Fantasia M.D.   On: 03/13/2016 11:44     ASSESSMENT AND PLAN:   1. WCT (EMS found hypotensive and emergently cardioverted)     Non-sustained episodes continue     Echo this admission with LVEF 60-65%     Pending LHC today     Hx of SVT w/WPW, notes report loss of antegrade conduction, followed out patient on CCB/BB      Seen by Dr. Lovena Le over the weekend, pending cath, may need EPS +/- ablation      Will check mag level, K+ OK  2. LGIB      (felt to be minor and hermorrhoids)     OK to pursue cardiac w/u and anticoag/antiplatlet if needed as per GI consult  3. Obesity  4. Bradycardia     Presented in junctional rhythm 40's     SB/SR since     On Metoprolol 50mg  BID (off home verapamil 240mg  daily)  Tommye Standard, PA-C 03/16/2016 7:24 AM  Pt seen and exqamined  ECG  personnnalyu revieiwed looks most like VT but with EF normal its hard to know Await cath and tehn will anticipate EPS

## 2016-03-17 ENCOUNTER — Encounter (HOSPITAL_COMMUNITY): Payer: Self-pay | Admitting: Cardiovascular Disease

## 2016-03-17 LAB — BASIC METABOLIC PANEL
ANION GAP: 5 (ref 5–15)
BUN: 9 mg/dL (ref 6–20)
CHLORIDE: 108 mmol/L (ref 101–111)
CO2: 26 mmol/L (ref 22–32)
CREATININE: 0.86 mg/dL (ref 0.61–1.24)
Calcium: 8.2 mg/dL — ABNORMAL LOW (ref 8.9–10.3)
GFR calc non Af Amer: >60 mL/min (ref >60)
Glucose, Bld: 97 mg/dL (ref 65–99)
POTASSIUM: 4.3 mmol/L (ref 3.5–5.1)
SODIUM: 139 mmol/L (ref 135–145)

## 2016-03-17 LAB — CBC
HCT: 36 % — ABNORMAL LOW (ref 39.0–52.0)
Hemoglobin: 11.5 g/dL — ABNORMAL LOW (ref 13.0–17.0)
MCH: 29.4 pg (ref 26.0–34.0)
MCHC: 31.9 g/dL (ref 30.0–36.0)
MCV: 92.1 fL (ref 78.0–100.0)
PLATELETS: 147 10*3/uL — AB (ref 150–400)
RBC: 3.91 MIL/uL — ABNORMAL LOW (ref 4.22–5.81)
RDW: 13.7 % (ref 11.5–15.5)
WBC: 6.2 10*3/uL (ref 4.0–10.5)

## 2016-03-17 MED ORDER — SODIUM CHLORIDE 0.9 % IV SOLN
INTRAVENOUS | Status: DC
  Administered 2016-03-17: 04:00:00 via INTRAVENOUS

## 2016-03-17 MED ORDER — HYDROCORTISONE ACETATE 25 MG RE SUPP: 25.0000 mg | Freq: Two times a day (BID) | RECTAL | 0 refills | Status: DC

## 2016-03-17 MED ORDER — METOPROLOL TARTRATE 25 MG PO TABS: 12.5000 mg | ORAL_TABLET | Freq: Two times a day (BID) | ORAL | 3 refills | Status: DC

## 2016-03-17 NOTE — Discharge Summary (Signed)
DISCHARGE SUMMARY    Patient ID: Chad Hogan,  MRN: [id number], DOB/AGE: 10/07/1940 75 y.o.  Admit date: 03/11/2016 Discharge date: 03/17/2016  Primary Care Physician: Ann Held, MD  Primary Cardiologist: Dr. Caryl Comes  Primary Discharge Diagnosis:  1. Palpitations 2. Near syncope 3. Wide complex tachycardia     S/p emergent cardioversion by EMS 4. LGIB     Felt secondary to hemorrhoids   Secondary Discharge Diagnosis:  1. WPW 2. COPD 3. OSA 4. HTN 5. HLD  Allergies  Allergen Reactions  . Eliquis [Apixaban] Other (See Comments)    Bleeding event  . Codeine Other (See Comments)    Cant remember     Procedures This Admission:  03/16/16: LHC, Dr. Claiborne Billings  Prox LAD lesion, 10 %stenosed. No significant coronary obstructive disease with a smooth mild proximal to mid 10% narrowing in the LAD after the takeoff of the first diagonal vessel with an area of mild mid systolic bridging in the mid LAD; normal left circumflex and right coronary arteries. LVEDP 13 mm Hg; in this patient with echo EF of 60-65% without wall motion abnormalities. RECOMMENDATION: Medical therapy.  Brief HPI: Chad Hogan is a 75 y.o. male was admitted to [facility] 03/11/16 with c/o sudden onset of palpitations, SOB and weakness, near syncope, he was found in [facility] and hypotensive by EMS and emergently cardioverted.  He was admitted to stepdown unit for further care and management.  Hospital Course:  The patient was admitted his labs initially notable for elevated LFTs, ARF, lactic acidosis felt secondary to hypoperfusion, given IVF and improved.  He was also noted with elevated Ddimer, given prior hx of PE, CT angio done after a nondianostic VQ scan and was negative for PE.  An echo was done that noted preserved LVEF.  He developed episode of BRBPR and GI consult obtained, felt to be hemorrhoidal bleeding and cleared to proceed with cath and antiplatelet/anticoagulation of needed.  His LFTs and renal  function continued to improve. Troponins flat.  He did undergo LHC 03/16/16 with no significant CAD.  Given this planned for EPS and possible ablation today, though given staffing constrictions unable to pursue.  He presented in a junctional rhythm his CCB from home held and maintained SR since HR generally 60's-80's.  He will go home with low dose metoprolol.  He denies any recurrent blood in his stool, will continue suppository as written.  The patient was examined by Dr. Lovena Le and considered stable for discharge to home.  He is scheduled with Dr. Lovena Le Monday for EPS/Ablation procedure.  Instructions have been provided for the patient.  Should he have any increase palpitations, symptoms he is instructed to return,  or call if questions.     Physical Exam: Vitals:   03/17/16 0400 03/17/16 0731 03/17/16 0805 03/17/16 1108  BP: 130/60 (!) 100/50  130/63  Pulse: 64 (!) 58  76  Resp: 15 14  (!) 23  Temp:  98.1 F (36.7 C)  97.7 F (36.5 C)  TempSrc:  Oral  Oral  SpO2: 95% 95% 94% 91%  Weight:      Height:        Labs:   Lab Results  Component Value Date   WBC 6.2 03/17/2016   HGB 11.5 (L) 03/17/2016   HCT 36.0 (L) 03/17/2016   MCV 92.1 03/17/2016   PLT 147 (L) 03/17/2016     Recent Labs Lab 03/14/16 0330 03/17/16 0927  NA 138 139  K 4.2  4.3  CL 107 108  CO2 25 26  BUN 14 9  CREATININE 0.98 0.86  CALCIUM 8.4* 8.2*  PROT 5.4*  --   BILITOT 0.5  --   ALKPHOS 127*  --   ALT 324*  --   AST 144*  --   GLUCOSE 92 97    Discharge Medications:    Medication List    STOP taking these medications   verapamil 240 MG CR tablet Commonly known as:  CALAN-SR     TAKE these medications   ADVAIR DISKUS 250-50 MCG/DOSE Aepb Generic drug:  Fluticasone-Salmeterol Inhale 1 puff into the lungs every 12 (twelve) hours.   ALPRAZolam 0.5 MG tablet Commonly known as:  XANAX Take 0.75 mg by mouth at bedtime.   B-12 5000 MCG Tbdp Take 5,000 Units by mouth daily.   ferrous  sulfate 325 (65 FE) MG tablet Take 1 tablet by mouth daily.   furosemide 40 MG tablet Commonly known as:  LASIX Take 40 mg by mouth 2 (two) times daily.   hydrocortisone 25 MG suppository Commonly known as:  ANUSOL-HC Place 1 suppository (25 mg total) rectally 2 (two) times daily.   losartan 100 MG tablet Commonly known as:  COZAAR Take 100 mg by mouth daily.   metoprolol tartrate 25 MG tablet Commonly known as:  LOPRESSOR Take 0.5 tablets (12.5 mg total) by mouth 2 (two) times daily. What changed:  medication strength  how much to take   multivitamin with minerals Tabs tablet Take 1 tablet by mouth daily.   omeprazole 20 MG capsule Commonly known as:  PRILOSEC Take 20 mg by mouth daily.   OVER THE COUNTER MEDICATION Inhale 1 application into the lungs at bedtime. CPAP with O2   rosuvastatin 10 MG tablet Commonly known as:  CRESTOR Take 10 mg by mouth at bedtime.   tiotropium 18 MCG inhalation capsule Commonly known as:  SPIRIVA Place 18 mcg into inhaler and inhale daily.   vitamin C 500 MG tablet Commonly known as:  ASCORBIC ACID Take 500 mg by mouth daily.       Disposition:  Home Discharge Instructions    Diet - low sodium heart healthy    Complete by:  As directed    Increase activity slowly    Complete by:  As directed        Duration of Discharge Encounter: Greater than 30 minutes including physician time.  Venetia Night, PA-C 03/17/2016 12:26 PM  EP Attending  Patient seen and examined. Agree with above. He is stable for dc home.   Mikle Bosworth.D.

## 2016-03-17 NOTE — Progress Notes (Signed)
Patient Name: Chad Hogan Date of Encounter: 03/17/2016     Active Problems:   Bradycardia   Ventricular tachycardia (HCC)    SUBJECTIVE  No chest pain or sob.  CURRENT MEDS . aspirin EC  81 mg Oral Daily  . ferrous sulfate  325 mg Oral Daily  . hydrocortisone  25 mg Rectal BID  . mouth rinse  15 mL Mouth Rinse BID  . pantoprazole  40 mg Oral Daily  . rosuvastatin  10 mg Oral QHS  . sodium chloride flush  3 mL Intravenous Q12H  . tiotropium  18 mcg Inhalation Daily  . vitamin B-12  500 mcg Oral Daily  . vitamin C  500 mg Oral Daily    OBJECTIVE  Vitals:   03/17/16 0400 03/17/16 0731 03/17/16 0805 03/17/16 1108  BP: 130/60 (!) 100/50  130/63  Pulse: 64 (!) 58  76  Resp: 15 14  (!) 23  Temp:  98.1 F (36.7 C)  97.7 F (36.5 C)  TempSrc:  Oral  Oral  SpO2: 95% 95% 94% 91%  Weight:      Height:        Intake/Output Summary (Last 24 hours) at 03/17/16 1125 Last data filed at 03/17/16 0731  Gross per 24 hour  Intake              552 ml  Output              550 ml  Net                2 ml   Filed Weights   03/11/16 1637 03/12/16 0358  Weight: 287 lb (130.2 kg) 293 lb 4.8 oz (133 kg)    PHYSICAL EXAM  General: Pleasant, NAD. Neuro: Alert and oriented X 3. Moves all extremities spontaneously. Psych: Normal affect. HEENT:  Normal  Neck: Supple without bruits or JVD. Lungs:  Resp regular and unlabored, CTA. Heart: RRR no s3, s4, or murmurs. Abdomen: Soft, non-tender, non-distended, BS + x 4.  Extremities: No clubbing, cyanosis or edema. DP/PT/Radials 2+ and equal bilaterally.  Accessory Clinical Findings  CBC  Recent Labs  03/15/16 1652 03/17/16 0824  WBC 11.1* 6.2  HGB 13.6 11.5*  HCT 42.1 36.0*  MCV 91.7 92.1  PLT 192 [id number]*   Basic Metabolic Panel  Recent Labs  03/16/16 0924 03/17/16 0927  NA  --  139  K  --  4.3  CL  --  108  CO2  --  26  GLUCOSE  --  97  BUN  --  9  CREATININE  --  0.86  CALCIUM  --  8.2*  MG 2.0  --     Liver Function Tests No results for input(s): AST, ALT, ALKPHOS, BILITOT, PROT, ALBUMIN in the last 72 hours. No results for input(s): LIPASE, AMYLASE in the last 72 hours. Cardiac Enzymes No results for input(s): CKTOTAL, CKMB, CKMBINDEX, TROPONINI in the last 72 hours. BNP Invalid input(s): POCBNP D-Dimer No results for input(s): DDIMER in the last 72 hours. Hemoglobin A1C No results for input(s): HGBA1C in the last 72 hours. Fasting Lipid Panel No results for input(s): CHOL, HDL, LDLCALC, TRIG, CHOLHDL, LDLDIRECT in the last 72 hours. Thyroid Function Tests No results for input(s): TSH, T4TOTAL, T3FREE, THYROIDAB in the last 72 hours.  Invalid input(s): FREET3  TELE  nsr  Radiology/Studies  Ct Angio Chest Pe W Or Wo Contrast  Result Date: 03/13/2016 CLINICAL DATA:  Elevated D-dimer and shortness  of breath. EXAM: CT ANGIOGRAPHY CHEST WITH CONTRAST TECHNIQUE: Multidetector CT imaging of the chest was performed using the standard protocol during bolus administration of intravenous contrast. Multiplanar CT image reconstructions and MIPs were obtained to evaluate the vascular anatomy. CONTRAST:  77 cc Isovue 370 intravenous COMPARISON:  01/04/2014 FINDINGS: Cardiovascular: Satisfactory opacification of the pulmonary arteries to the segmental level, but limited by motion, especially at the distal segmental level in the lower lobes. No evidence of pulmonary embolism. Borderline cardiomegaly. No pericardial effusion. Prominent size of the right heart and central pulmonary arteries which could reflect pulmonary hypertension in this patient with COPD history. Coronary atherosclerotic calcifications seen along the left main. Mediastinum/Nodes: Negative for mass. Stable if not diminished hilar and subcarinal lymph nodes size. Lungs/Pleura: COPD with centrilobular emphysema and generalized airway thickening. Subpleural reticulation at the right more than left bases is stable, including small  cystic spaces in the subpleural right middle lobe. Stable pulmonary nodule in the right upper lobe, 6:39, 3 mm in average diameter. There are peripheral angular appearing nodular densities in the right lower lobe on 6:91 and 99, appearance similar to prior (especially PET-CT 02/13/2014) and favored from scarring. Upper Abdomen: No acute finding. Presumed 1 cm cyst exophytic from the upper pole right kidney. Musculoskeletal: No acute or aggressive finding. Review of the MIP images confirms the above findings. IMPRESSION: 1. Motion degraded study without evidence of pulmonary embolism. 2. COPD with emphysema and airway thickening. 3. Chronic scarring and nodularity when compared to 2015. Electronically Signed   By: Monte Fantasia M.D.   On: 03/13/2016 11:44   Nm Pulmonary Perf And Vent  Result Date: 03/12/2016 CLINICAL DATA:  Shortness of breath. EXAM: NUCLEAR MEDICINE VENTILATION - PERFUSION LUNG SCAN TECHNIQUE: Ventilation images were obtained in multiple projections using inhaled aerosol Tc-62m DTPA. Perfusion images were obtained in multiple projections after intravenous injection of Tc-11m MAA. RADIOPHARMACEUTICALS:  31.1 mCi Technetium-25m DTPA aerosol inhalation and 4.1 mCi Technetium-90m MAA IV COMPARISON:  PET-CT 02/13/2014. FINDINGS: Small matching ventilation and perfusion defects are noted. Scan is indeterminate for pulmonary embolus. IMPRESSION: Small matching ventilation perfusion defects. Indeterminate scan for pulmonary embolus . Electronically Signed   By: Marcello Moores  Register   On: 03/12/2016 11:38   Dg Chest Portable 1 View  Result Date: 03/11/2016 CLINICAL DATA:  Flutter feeling in chest today, shortness of breath, diaphoresis, episode of ventricular tachycardia, atrial fibrillation, drop in blood pressure, history COPD, hyperlipidemia, ischemic heart disease, a arrhythmias, hypertension EXAM: PORTABLE CHEST 1 VIEW COMPARISON:  Portable exam 1652 hours compared to 12/19/2013 FINDINGS:  Enlargement of cardiac silhouette. Mediastinal contours and pulmonary vascularity normal. Bronchitic changes with scarring at lateral RIGHT lung base. No definite acute infiltrate, pleural effusion or pneumothorax. External pacing lead projects over chest. IMPRESSION: Chronic bronchitic changes and RIGHT basilar scarring. Enlargement of cardiac silhouette. No acute abnormalities. Electronically Signed   By: Lavonia Dana M.D.   On: 03/11/2016 17:11    ASSESSMENT AND PLAN  1. Wide QRS tachycardia - he has had no recurrent arrhythmia. His cath and echo are negative. Our schedule today will not allow for EP study and ablation. I will schedule him next week and allow him to go home later today.  2. Obesity - he needs to lose weight.  3. COPD - he is at his baseline with no wheezing on exam.  Carleene Overlie Jedi Catalfamo,M.D.  03/17/2016 11:25 AMPatient ID: Adela Lank, male   DOB: 1941-01-14, 75 y.o.   MRN: [id number]

## 2016-03-17 NOTE — Progress Notes (Signed)
Right Radial TR band removed at 0130. Site assessment shows some bruising at a level 1. Bethena Roys RN observed site post band removal. Gauze and tegaderm dressing applied. Dressing is clean, dry, intact.   Vitals remain stable. Pt resting comfortably. Will continue to monitor.

## 2016-03-17 NOTE — Discharge Instructions (Signed)
If you have any increased palpitations or symptoms return, if you have any fainting or near fainting spells seek medical attention immediately with 911 services of needed, do not drive yourself.      You are scheduled for your heart procedure (Electrophysiology study with possible ablation) with Dr. Lovena Le on Monday 03/23/16 at 7:30AM.  You need to have nothing by mouth after midnight the evening prior, it is OK to have you medicines the morning of the procedure only with sips of water.  Do not take your metoprolol the day prior (Sunday) or the day of your procedure.  Do not take your furosemide the morning of the procedure.  Please arrive to [facility] Monday 03/23/16 at 5:30AM the main entrance [facility] tower) admission desk.  If you have any questions, please call Dr. Tanna Furry office at [phone]

## 2016-03-22 ENCOUNTER — Telehealth: Payer: Self-pay | Admitting: Physician Assistant

## 2016-03-22 NOTE — Telephone Encounter (Signed)
Pt had gotten a cold and wanted to know if he should cancel the EP procedure. He does not want to.  Pt not running fevers. He does not feel that bad and thinks he can control his symptoms.  Advised him that if he could control sx and was not running fevers, ok to come in for the procedure. Anything changes, call us.  Rosaria Ferries, Hershal Coria 03/22/2016 11:29 AM Beeper [phone]

## 2016-03-23 ENCOUNTER — Encounter (HOSPITAL_COMMUNITY): Payer: Self-pay | Admitting: *Deleted

## 2016-03-23 ENCOUNTER — Encounter (HOSPITAL_COMMUNITY)
Admission: RE | Disposition: A | Discharge status: Home / Self Care | Payer: Self-pay | Source: Ambulatory Visit | Attending: Internal Medicine

## 2016-03-23 ENCOUNTER — Ambulatory Visit (HOSPITAL_COMMUNITY)
Admission: RE | Admit: 2016-03-23 | Discharge: 2016-03-24 | Disposition: A | Discharge status: Home / Self Care | Payer: Commercial Managed Care - HMO | Source: Ambulatory Visit | Attending: Internal Medicine | Admitting: Internal Medicine

## 2016-03-23 ENCOUNTER — Other Ambulatory Visit: Payer: Self-pay

## 2016-03-23 DIAGNOSIS — Z6839 Body mass index (BMI) 39.0-39.9, adult: Secondary | ICD-10-CM | POA: Diagnosis not present

## 2016-03-23 DIAGNOSIS — I471 Supraventricular tachycardia: Secondary | ICD-10-CM | POA: Diagnosis not present

## 2016-03-23 DIAGNOSIS — E785 Hyperlipidemia, unspecified: Secondary | ICD-10-CM | POA: Diagnosis not present

## 2016-03-23 DIAGNOSIS — Z885 Allergy status to narcotic agent status: Secondary | ICD-10-CM | POA: Diagnosis not present

## 2016-03-23 DIAGNOSIS — I10 Essential (primary) hypertension: Secondary | ICD-10-CM | POA: Insufficient documentation

## 2016-03-23 DIAGNOSIS — I456 Pre-excitation syndrome: Secondary | ICD-10-CM | POA: Diagnosis not present

## 2016-03-23 DIAGNOSIS — R791 Abnormal coagulation profile: Secondary | ICD-10-CM | POA: Diagnosis not present

## 2016-03-23 DIAGNOSIS — J449 Chronic obstructive pulmonary disease, unspecified: Secondary | ICD-10-CM | POA: Insufficient documentation

## 2016-03-23 DIAGNOSIS — G4733 Obstructive sleep apnea (adult) (pediatric): Secondary | ICD-10-CM | POA: Diagnosis not present

## 2016-03-23 DIAGNOSIS — Z87891 Personal history of nicotine dependence: Secondary | ICD-10-CM | POA: Insufficient documentation

## 2016-03-23 DIAGNOSIS — Z888 Allergy status to other drugs, medicaments and biological substances status: Secondary | ICD-10-CM | POA: Insufficient documentation

## 2016-03-23 HISTORY — PX: ELECTROPHYSIOLOGIC STUDY: SHX172A

## 2016-03-23 LAB — POCT ACTIVATED CLOTTING TIME
Activated Clotting Time: 153 seconds
Activated Clotting Time: 180 seconds

## 2016-03-23 SURGERY — ELECTROPHYSIOLOGY STUDY

## 2016-03-23 MED ORDER — SODIUM CHLORIDE 0.9% FLUSH
3.0000 mL | Freq: Two times a day (BID) | INTRAVENOUS | Status: DC
  Administered 2016-03-24: 3 mL via INTRAVENOUS

## 2016-03-23 MED ORDER — ALPRAZOLAM 0.5 MG PO TABS
0.7500 mg | ORAL_TABLET | Freq: Every day | ORAL | Status: DC
  Administered 2016-03-23: 23:00:00 0.75 mg via ORAL
  Filled 2016-03-23: qty 1

## 2016-03-23 MED ORDER — BUPIVACAINE HCL (PF) 0.25 % IJ SOLN
INTRAMUSCULAR | Status: DC | PRN
  Administered 2016-03-23: 45 mL

## 2016-03-23 MED ORDER — SODIUM CHLORIDE 0.9% FLUSH: 3.0000 mL | INTRAVENOUS | Status: DC | PRN

## 2016-03-23 MED ORDER — ACETAMINOPHEN 325 MG PO TABS: 650.0000 mg | ORAL_TABLET | ORAL | Status: DC | PRN

## 2016-03-23 MED ORDER — HEPARIN SODIUM (PORCINE) 1000 UNIT/ML IJ SOLN
INTRAMUSCULAR | Status: AC
  Filled 2016-03-23: qty 1

## 2016-03-23 MED ORDER — BUPIVACAINE HCL (PF) 0.25 % IJ SOLN
INTRAMUSCULAR | Status: AC
  Filled 2016-03-23: qty 60

## 2016-03-23 MED ORDER — FENTANYL CITRATE (PF) 100 MCG/2ML IJ SOLN
INTRAMUSCULAR | Status: DC | PRN
  Administered 2016-03-23: 12.5 ug via INTRAVENOUS
  Administered 2016-03-23: 25 ug via INTRAVENOUS
  Administered 2016-03-23 (×8): 12.5 ug via INTRAVENOUS

## 2016-03-23 MED ORDER — MIDAZOLAM HCL 5 MG/5ML IJ SOLN
INTRAMUSCULAR | Status: AC
  Filled 2016-03-23: qty 5

## 2016-03-23 MED ORDER — B-12 5000 MCG PO TBDP: 5000.0000 [IU] | ORAL_TABLET | Freq: Every day | ORAL | Status: DC

## 2016-03-23 MED ORDER — HEPARIN (PORCINE) IN NACL 2-0.9 UNIT/ML-% IJ SOLN
INTRAMUSCULAR | Status: DC | PRN
  Administered 2016-03-23: 08:00:00

## 2016-03-23 MED ORDER — ROSUVASTATIN CALCIUM 10 MG PO TABS
10.0000 mg | ORAL_TABLET | Freq: Every day | ORAL | Status: DC
  Administered 2016-03-23: 10 mg via ORAL
  Filled 2016-03-23: qty 1

## 2016-03-23 MED ORDER — FERROUS SULFATE 325 (65 FE) MG PO TABS
325.0000 mg | ORAL_TABLET | Freq: Every day | ORAL | Status: DC
  Administered 2016-03-24: 325 mg via ORAL
  Filled 2016-03-23 (×2): qty 1

## 2016-03-23 MED ORDER — SODIUM CHLORIDE 0.9 % IV SOLN: 250.0000 mL | INTRAVENOUS | Status: DC | PRN

## 2016-03-23 MED ORDER — HYDROCORTISONE ACETATE 25 MG RE SUPP
25.0000 mg | Freq: Two times a day (BID) | RECTAL | Status: DC
  Filled 2016-03-23 (×2): qty 1

## 2016-03-23 MED ORDER — FENTANYL CITRATE (PF) 100 MCG/2ML IJ SOLN
INTRAMUSCULAR | Status: AC
  Filled 2016-03-23: qty 2

## 2016-03-23 MED ORDER — ADULT MULTIVITAMIN W/MINERALS CH
1.0000 | ORAL_TABLET | Freq: Every day | ORAL | Status: DC
  Administered 2016-03-24: 1 via ORAL
  Filled 2016-03-23 (×2): qty 1

## 2016-03-23 MED ORDER — HEPARIN (PORCINE) IN NACL 2-0.9 UNIT/ML-% IJ SOLN
INTRAMUSCULAR | Status: AC
  Filled 2016-03-23: qty 500

## 2016-03-23 MED ORDER — METOPROLOL TARTRATE 5 MG/5ML IV SOLN
INTRAVENOUS | Status: DC | PRN
  Administered 2016-03-23: 5 mg via INTRAVENOUS

## 2016-03-23 MED ORDER — METOPROLOL TARTRATE 5 MG/5ML IV SOLN
5.0000 mg | Freq: Once | INTRAVENOUS | Status: AC
  Administered 2016-03-23: 5 mg via INTRAVENOUS

## 2016-03-23 MED ORDER — VITAMIN B-12 1000 MCG PO TABS: 5000.0000 ug | ORAL_TABLET | Freq: Every day | ORAL | Status: DC

## 2016-03-23 MED ORDER — DILTIAZEM HCL 25 MG/5ML IV SOLN
10.0000 mg | Freq: Once | INTRAVENOUS | Status: AC
  Administered 2016-03-23: 10 mg via INTRAVENOUS
  Filled 2016-03-23: qty 5

## 2016-03-23 MED ORDER — ONDANSETRON HCL 4 MG/2ML IJ SOLN: 4.0000 mg | Freq: Four times a day (QID) | INTRAMUSCULAR | Status: DC | PRN

## 2016-03-23 MED ORDER — ADENOSINE 6 MG/2ML IV SOLN
6.0000 mg | Freq: Once | INTRAVENOUS | Status: DC
  Filled 2016-03-23: qty 2

## 2016-03-23 MED ORDER — METOPROLOL TARTRATE 5 MG/5ML IV SOLN
INTRAVENOUS | Status: AC
  Filled 2016-03-23: qty 5

## 2016-03-23 MED ORDER — MIDAZOLAM HCL 5 MG/5ML IJ SOLN
INTRAMUSCULAR | Status: DC | PRN
  Administered 2016-03-23 (×11): 1 mg via INTRAVENOUS

## 2016-03-23 MED ORDER — HEPARIN SODIUM (PORCINE) 1000 UNIT/ML IJ SOLN
INTRAMUSCULAR | Status: DC | PRN
Start: 2016-03-23 — End: 2016-03-23
  Administered 2016-03-23: 6000 [IU] via INTRAVENOUS
  Administered 2016-03-23 (×2): 3000 [IU] via INTRAVENOUS

## 2016-03-23 MED ORDER — SODIUM CHLORIDE 0.9% FLUSH: 3.0000 mL | Freq: Two times a day (BID) | INTRAVENOUS | Status: DC

## 2016-03-23 MED ORDER — FUROSEMIDE 40 MG PO TABS
40.0000 mg | ORAL_TABLET | Freq: Every day | ORAL | Status: DC
Start: 2016-03-23 — End: 2016-03-24
  Administered 2016-03-24: 40 mg via ORAL
  Filled 2016-03-23 (×2): qty 1

## 2016-03-23 MED ORDER — LOSARTAN POTASSIUM 50 MG PO TABS
100.0000 mg | ORAL_TABLET | Freq: Every day | ORAL | Status: DC
  Administered 2016-03-24: 100 mg via ORAL
  Filled 2016-03-23: qty 2

## 2016-03-23 MED ORDER — METOPROLOL TARTRATE 12.5 MG HALF TABLET
12.5000 mg | ORAL_TABLET | Freq: Two times a day (BID) | ORAL | Status: DC
  Administered 2016-03-23 – 2016-03-24 (×2): 12.5 mg via ORAL
  Filled 2016-03-23 (×2): qty 1

## 2016-03-23 MED ORDER — METOPROLOL TARTRATE 5 MG/5ML IV SOLN
INTRAVENOUS | Status: AC
  Administered 2016-03-23: 5 mg
  Filled 2016-03-23: qty 5

## 2016-03-23 MED ORDER — FLECAINIDE ACETATE 100 MG PO TABS
100.0000 mg | ORAL_TABLET | Freq: Two times a day (BID) | ORAL | Status: DC
  Administered 2016-03-23 – 2016-03-24 (×2): 100 mg via ORAL
  Filled 2016-03-23 (×2): qty 1

## 2016-03-23 MED ORDER — METOPROLOL TARTRATE 5 MG/5ML IV SOLN: 5.0000 mg | Freq: Once | INTRAVENOUS | Status: DC

## 2016-03-23 MED ORDER — TIOTROPIUM BROMIDE MONOHYDRATE 18 MCG IN CAPS
18.0000 ug | ORAL_CAPSULE | Freq: Every day | RESPIRATORY_TRACT | Status: DC
  Filled 2016-03-23 (×2): qty 5

## 2016-03-23 SURGICAL SUPPLY — 12 items
BAG SNAP BAND KOVER 36X36 (MISCELLANEOUS) ×2 IMPLANT
CATH CELSIUS THERM B CV 7F (ABLATOR) ×2 IMPLANT
CATH CELSIUS THERM D CV 7F (ABLATOR) ×2 IMPLANT
CATH HEX JOS 2-5-2 65CM 6F REP (CATHETERS) ×2 IMPLANT
CATH JOSEPHSON QUAD-ALLRED 6FR (CATHETERS) ×4 IMPLANT
PACK EP LATEX FREE (CUSTOM PROCEDURE TRAY) ×3
PACK EP LF (CUSTOM PROCEDURE TRAY) IMPLANT
PAD DEFIB LIFELINK (PAD) ×2 IMPLANT
SHEATH PINNACLE 6F 10CM (SHEATH) ×4 IMPLANT
SHEATH PINNACLE 7F 10CM (SHEATH) ×2 IMPLANT
SHEATH PINNACLE 8F 10CM (SHEATH) ×4 IMPLANT
SHIELD RADPAD SCOOP 12X17 (MISCELLANEOUS) ×2 IMPLANT

## 2016-03-23 NOTE — Progress Notes (Signed)
Site area: Rt. femoral Site Prior to Removal:  Level 0 Pressure Applied For:32minutes Manual:   yes Patient Status During Pull:  Stable, patient tolerated well Post Pull Site:  Level 1 on the RF sitel. Bruising. IJ level 0 Post Pull Instructions Given:  yes Post Pull Pulses Present: rt DP palpable Dressing Applied: yes, sterile dressings and pressure dressing at Rt femoral Bedrest begins @ 12:20 Comments: skin tear at right inguinal crease

## 2016-03-23 NOTE — Discharge Instructions (Signed)
No driving for 3 days. No lifting over 5 lbs for 1 week. No sexual activity for 1 week. You may return to work in 1 week.  Keep procedure site clean & dry. If you notice increased pain, swelling, bleeding or pus, call/return!  You may shower, but no soaking baths/hot tubs/pools for 1 week.  ° ° °

## 2016-03-23 NOTE — Interval H&P Note (Signed)
History and Physical Interval Note:  03/23/2016 11:02 AM  Chad Hogan  has presented today for surgery, with the diagnosis of syncope  The various methods of treatment have been discussed with the patient and family. After consideration of risks, benefits and other options for treatment, the patient has consented to  Procedure(s): Electrophysiology Study (N/A) as a surgical intervention .  The patient's history has been reviewed, patient examined, no change in status, stable for surgery.  I have reviewed the patient's chart and labs.  Questions were answered to the patient's satisfaction.     [name]

## 2016-03-23 NOTE — Progress Notes (Signed)
Pt heart rhythm is in SVT after he walked to the bathroom after bed rest post attempt ablation today. BP=123/80 o2 sat 92 %. Cardiology team notified. Ferdinand Lango, RN

## 2016-03-23 NOTE — Discharge Summary (Signed)
ELECTROPHYSIOLOGY PROCEDURE DISCHARGE SUMMARY    Patient ID: Chad Hogan,  MRN: [id number], DOB/AGE: 76/09/1940 76 y.o.  Admit date: 03/23/2016 Discharge date: 03/24/2016  Primary Care Physician: Ann Held, MD Electrophysiologist: Caryl Comes  Primary Discharge Diagnosis:  Active Problems:   WOLFF (WOLFE)-PARKINSON-WHITE (WPW) SYNDROME   SVT (supraventricular tachycardia) ([place])   WPW syndrome  Secondary Discharge Diagnosis: 1.  Obesity 2.  COPD 3.  Hypertension 4.  Hyperlipidemia 5.  OSA  Allergies  Allergen Reactions  . Eliquis [Apixaban] Other (See Comments)    Bleeding event  . Codeine Other (See Comments)    Cant remember    Procedures This Admission:  1. Electrophysiology study on 03/23/2016 by Dr Lovena Le. This study demonstrated left sided accessory pathway that cold be transiently terminated with ablation, but then would return. His procedure was stopped when he had received 59 minutes of fluoroscopy. Also notes was difficulty crossing the aortic valve. There were no early apparent complications.   Brief HPI:  Chad Hogan is a 75 y.o. male with a past medical history as outlined above.  He was recently admitted with sustained wide complex tachycardia that was cardioverted by EMS.  Catheterization that admission demonstrated no obstructive CAD.  He was planned for outpatient EPS and ablation.   Hospital Course:  The patient presented for EPS and ablation.  He underwent EPS with details as outlined above.  He was monitored on telemetry overnight which demonstrated sinus rhythm with recurrent SVT for which he was started on amidarone. Groin and neck incision were without complication. He will be seen in follow up in office with consideration for repeat procedure with transseptal puncture. Because of recurrent SVT, he will be started on Flecainide 100mg  twice daily by Dr Lovena Le at discharge. He will need repeat EKG in 1 week with nurse visit.   Physical  Exam: Vitals:   03/24/16 0415 03/24/16 0418 03/24/16 0430 03/24/16 0445  BP: (!) 80/52 (!) 80/52 95/69 (!) 99/57  Pulse: 64 72 64 64  Resp: (!) 25 (!) 26 (!) 22 (!) 27  Temp:  98 F (36.7 C)    TempSrc:  Oral    SpO2: 91% 92% 91% 94%  Weight:      Height:        GEN- The patient is obese appearing, alert and oriented x 3 today.   HEENT: normocephalic, atraumatic; sclera clear, conjunctiva pink; hearing intact; oropharynx clear; neck supple  Lungs- Clear to ausculation bilaterally, normal work of breathing.  No wheezes, rales, rhonchi Heart- Regular rate and rhythm  GI- soft, non-tender, non-distended, bowel sounds present  Extremities- no clubbing, cyanosis, or edema, large ecchymotic area in the right groin, soft with no bruit or thrill. MS- no significant deformity or atrophy Skin- warm and dry, no rash or lesion Psych- euthymic mood, full affect Neuro- strength and sensation are intact    Labs:   Lab Results  Component Value Date   WBC 5.5 03/24/2016   HGB 10.9 (L) 03/24/2016   HCT 34.7 (L) 03/24/2016   MCV 93.0 03/24/2016   PLT 142 (L) 03/24/2016     Recent Labs Lab 03/24/16 0235  NA 138  K 3.7  CL 103  CO2 29  BUN 9  CREATININE 0.94  CALCIUM 7.2*  GLUCOSE 89     Discharge Medications:  Current Discharge Medication List    START taking these medications   Details  flecainide (TAMBOCOR) 100 MG tablet Take 1 tablet (100 mg total) by mouth  every 12 (twelve) hours. Qty: 60 tablet, Refills: 1      CONTINUE these medications which have NOT CHANGED   Details  ALPRAZolam (XANAX) 0.5 MG tablet Take 0.75 mg by mouth at bedtime.     ferrous sulfate 325 (65 FE) MG tablet Take 1 tablet by mouth daily. Refills: 3    Fluticasone-Salmeterol (ADVAIR DISKUS) 250-50 MCG/DOSE AEPB Inhale 1 puff into the lungs every 12 (twelve) hours.      furosemide (LASIX) 40 MG tablet Take 40 mg by mouth daily.     hydrocortisone (ANUSOL-HC) 25 MG suppository Place 1  suppository (25 mg total) rectally 2 (two) times daily. Qty: 12 suppository, Refills: 0    losartan (COZAAR) 100 MG tablet Take 100 mg by mouth daily.     Methylcobalamin (B-12) 5000 MCG TBDP Take 5,000 Units by mouth daily.    metoprolol (LOPRESSOR) 25 MG tablet Take 0.5 tablets (12.5 mg total) by mouth 2 (two) times daily. Qty: 30 tablet, Refills: 3    Multiple Vitamin (MULTIVITAMIN WITH MINERALS) TABS tablet Take 1 tablet by mouth daily.    omeprazole (PRILOSEC) 20 MG capsule Take 20 mg by mouth daily.  Refills: 1    OVER THE COUNTER MEDICATION Inhale 1 application into the lungs at bedtime. CPAP with O2    rosuvastatin (CRESTOR) 10 MG tablet Take 10 mg by mouth at bedtime.     tiotropium (SPIRIVA) 18 MCG inhalation capsule Place 18 mcg into inhaler and inhale daily.      vitamin C (ASCORBIC ACID) 500 MG tablet Take 500 mg by mouth daily.         Disposition: Pt is being discharged home today in good condition. Discharge Instructions    Diet - low sodium heart healthy    Complete by:  As directed    Increase activity slowly    Complete by:  As directed      Follow-up Information    [name], MD Follow up on 04/15/2016.   Specialty:  Cardiology Why:  at 3:15PM  Contact information: [address] [address] [phone]        [place] [facility] Follow up on 04/01/2016.   Specialty:  Cardiology Why:  at 11:30AM for EKG  Contact information: [address] [place] [phone]          Duration of Discharge Encounter: Greater than 30 minutes including physician time.  Signed, Chanetta Marshall, NP 03/24/2016 8:01 AM  EP Attending  Patient seen and examined. Agree with above with minimal modification. He is stable for DC home. He will return as documented above.   Mikle Bosworth.D.

## 2016-03-23 NOTE — H&P (View-Only) (Signed)
ELECTROPHYSIOLOGY CONSULT NOTE    Patient ID: LUISMIGUEL BONSALL MRN: [id number], DOB/AGE: 11/05/1940 75 y.o.  Admit date: 03/11/2016 Date of Consult: 03/12/2016   Primary Physician: Ann Held, MD Primary Cardiologist: Caryl Comes  Reason for Consultation: WCT  HPI:  KARRY GAEBLER is a 75 y.o. male with a past medical history significant for morbid obesity, COPD, hypertension, hyperlipidemia, prior PE, OSA, and WPW. He has been followed by Dr Caryl Comes and has been medically managed with CCB and BB.  He has lost antegrade conduction of his pathway.  Yesterday, he turned over in bed and felt his heart "jump".  He then went back to sleep and did not have significant palpitations, but did have progressive shortness of breath. Eventually, after several hours, he called 911 and was found to be in [facility] with hypotension for which he was emergently cardioverted.  Labs on admission were significant for lactic acidosis, AKI, and elevated LFT's.  He has been fluid resuscitated and is significantly improved today.  Echo from 2015 demonstrated normal LV function. Repeat echo is pending this admission.  Telemetry has demonstrated sinus rhythm.   He currently denies chest pain, shortness of breath, syncope, recent fevers, chills, nausea or vomiting.  He is very sedentary at home.     Past Medical History:  Diagnosis Date  . Anxiety   . COPD (chronic obstructive pulmonary disease) ([place])   . HLD (hyperlipidemia)   . HTN (hypertension)   . Ischemic heart disease   . Obesity   . OSA (obstructive sleep apnea)   . Palpitation   . PVC (premature ventricular contraction)   . SOB (shortness of breath)   . SVT (supraventricular tachycardia) ([place])   . Umbilical hernia   . WPW (Wolff-Parkinson-White syndrome)      Surgical History:  Past Surgical History:  Procedure Laterality Date  . COLONOSCOPY WITH PROPOFOL N/A 06/11/2014   Procedure: COLONOSCOPY WITH PROPOFOL;  Surgeon: Inda Castle, MD;  Location: [facility];  Service: Endoscopy;  Laterality: N/A;  . ESOPHAGOGASTRODUODENOSCOPY (EGD) WITH PROPOFOL N/A 06/11/2014   Procedure: ESOPHAGOGASTRODUODENOSCOPY (EGD) WITH PROPOFOL;  Surgeon: Inda Castle, MD;  Location: [facility];  Service: Endoscopy;  Laterality: N/A;  . HERNIA REPAIR       Prescriptions Prior to Admission  Medication Sig Dispense Refill Last Dose  . ALPRAZolam (XANAX) 0.5 MG tablet Take 0.75 mg by mouth at bedtime.    03/11/2016 at Unknown time  . ferrous sulfate 325 (65 FE) MG tablet Take 1 tablet by mouth daily.  3 03/11/2016 at Unknown time  . Fluticasone-Salmeterol (ADVAIR DISKUS) 250-50 MCG/DOSE AEPB Inhale 1 puff into the lungs every 12 (twelve) hours.     03/11/2016 at am  . furosemide (LASIX) 40 MG tablet Take 40 mg by mouth 2 (two) times daily.    03/11/2016 at am  . losartan (COZAAR) 100 MG tablet Take 100 mg by mouth daily.    03/11/2016 at Unknown time  . Methylcobalamin (B-12) 5000 MCG TBDP Take 5,000 Units by mouth daily.   03/11/2016 at Unknown time  . metoprolol (LOPRESSOR) 50 MG tablet Take 50 mg by mouth 2 (two) times daily.     03/11/2016 at 0900  . Multiple Vitamin (MULTIVITAMIN WITH MINERALS) TABS tablet Take 1 tablet by mouth daily.   03/11/2016 at Unknown time  . omeprazole (PRILOSEC) 20 MG capsule Take 20 mg by mouth daily.   1 03/11/2016 at Unknown time  . OVER THE COUNTER MEDICATION Inhale 1 application into  the lungs at bedtime. CPAP with O2   03/10/2016 at Unknown time  . rosuvastatin (CRESTOR) 10 MG tablet Take 10 mg by mouth at bedtime.    03/10/2016 at Unknown time  . tiotropium (SPIRIVA) 18 MCG inhalation capsule Place 18 mcg into inhaler and inhale daily.     03/11/2016 at Unknown time  . verapamil (CALAN-SR) 240 MG CR tablet Take 1 tablet (240 mg total) by mouth 2 (two) times daily. 60 tablet 8 03/11/2016 at am  . vitamin C (ASCORBIC ACID) 500 MG tablet Take 500 mg by mouth daily.    03/11/2016 at Unknown time    Inpatient Medications:  .  aspirin EC  81 mg Oral Daily  . ferrous sulfate  325 mg Oral Daily  . mouth rinse  15 mL Mouth Rinse BID  . metoprolol tartrate  50 mg Oral BID  . pantoprazole  40 mg Oral Daily  . rosuvastatin  10 mg Oral QHS  . tiotropium  18 mcg Inhalation Daily  . vitamin B-12  500 mcg Oral Daily  . vitamin C  500 mg Oral Daily    Allergies:  Allergies  Allergen Reactions  . Eliquis [Apixaban] Other (See Comments)    Bleeding event  . Codeine Other (See Comments)    Cant remember    Social History   Social History  . Marital status: Divorced    Spouse name: N/A  . Number of children: 2  . Years of education: N/A   Occupational History  . retired  Psychologist, prison and probation services   Social History Main Topics  . Smoking status: Former Smoker    Quit date: 04/13/1996  . Smokeless tobacco: Never Used  . Alcohol use No  . Drug use: No  . Sexual activity: Not Currently   Other Topics Concern  . Not on file   Social History Narrative  . No narrative on file     Family History  Problem Relation Age of Onset  . Liver cancer    . Heart disease       Review of Systems: All other systems reviewed and are otherwise negative except as noted above.  Physical Exam: Vitals:   03/12/16 0900 03/12/16 1000 03/12/16 1100 03/12/16 1200  BP: 132/60  (!) 141/72   Pulse: 65 74 69 70  Resp: 18 20 15  (!) 23  Temp:      TempSrc:      SpO2: 94% 95% 96% 92%  Weight:      Height:        GEN- The patient is obese appearing, alert and oriented x 3 today.   HEENT: normocephalic, atraumatic; sclera clear, conjunctiva pink; hearing intact; oropharynx clear; neck supple  Lungs- Clear to ausculation bilaterally, normal work of breathing.  No wheezes, rales, rhonchi Heart- Regular rate and rhythm  GI- soft, non-tender, non-distended, bowel sounds present  Extremities- no clubbing, cyanosis, +dependent BLE edema MS- no significant deformity or atrophy Skin- warm and dry, no rash or lesion Psych- euthymic mood, full  affect Neuro- strength and sensation are intact  Labs:   Lab Results  Component Value Date   WBC 13.3 (H) 03/11/2016   HGB 15.2 03/11/2016   HCT 47.4 03/11/2016   MCV 92.2 03/11/2016   PLT 179 03/11/2016    Recent Labs Lab 03/11/16 1600  03/12/16 0544  NA 138  --  138  K 6.9*  < > 4.6  CL 110  --  110  CO2 15*  --  20*  BUN 26*  --  29*  CREATININE 2.45*  --  1.64*  CALCIUM 8.1*  --  8.0*  PROT 5.5*  --   --   BILITOT 1.8*  --   --   ALKPHOS 140*  --   --   ALT 145*  --   --   AST 137*  --   --   GLUCOSE 140*  --  74  < > = values in this interval not displayed.    Radiology/Studies: Nm Pulmonary Perf And Vent Result Date: 03/12/2016 CLINICAL DATA:  Shortness of breath. EXAM: NUCLEAR MEDICINE VENTILATION - PERFUSION LUNG SCAN TECHNIQUE: Ventilation images were obtained in multiple projections using inhaled aerosol Tc-75m DTPA. Perfusion images were obtained in multiple projections after intravenous injection of Tc-76m MAA. RADIOPHARMACEUTICALS:  31.1 mCi Technetium-67m DTPA aerosol inhalation and 4.1 mCi Technetium-24m MAA IV COMPARISON:  PET-CT 02/13/2014. FINDINGS: Small matching ventilation and perfusion defects are noted. Scan is indeterminate for pulmonary embolus. IMPRESSION: Small matching ventilation perfusion defects. Indeterminate scan for pulmonary embolus . Electronically Signed   By: Marcello Moores  Register   On: 03/12/2016 11:38   Dg Chest Portable 1 View Result Date: 03/11/2016 CLINICAL DATA:  Flutter feeling in chest today, shortness of breath, diaphoresis, episode of ventricular tachycardia, atrial fibrillation, drop in blood pressure, history COPD, hyperlipidemia, ischemic heart disease, a arrhythmias, hypertension EXAM: PORTABLE CHEST 1 VIEW COMPARISON:  Portable exam 1652 hours compared to 12/19/2013 FINDINGS: Enlargement of cardiac silhouette. Mediastinal contours and pulmonary vascularity normal. Bronchitic changes with scarring at lateral RIGHT lung base. No  definite acute infiltrate, pleural effusion or pneumothorax. External pacing lead projects over chest. IMPRESSION: Chronic bronchitic changes and RIGHT basilar scarring. Enlargement of cardiac silhouette. No acute abnormalities. Electronically Signed   By: Lavonia Dana M.D.   On: 03/11/2016 17:11    EKG: junctional rhythm, rate 46  TELEMETRY: sinus rhythm  Assessment/Plan: 1.  WCT EMS strips show WCT at a rate of 150bpm with precordial concordance.  Dr Lovena Le reviewed and concern is that rhythm is ventricular tachycardia. Ischemic evaluation is warranted. With size, myoview scanning would likely not be as helpful. Will plan LHC tomorrow with possible EPS to follow.  Will hydrate overnight tonight.  I have asked that echo be done today   2.  Elevated D-dimer VQ scan indeterminate With prior history of PE, likely will need to do CT scan. Will discuss with Dr Lovena Le.  He is currently refusing Heparin  3.  Obesity Body mass index is 40.91 kg/m. Weight loss encouraged    Signed, Chanetta Marshall, NP 03/12/2016 12:12 PM  EP Attending  Patient seen and examined. Agree with above. The patient is a morbidly obese man with known WPW who has had resolution of ventricular pre-excitation who presented with a wide QRS tachy which looks like VT. However, he has had preserved LV function by echo. He did not have a large troponin increase. He denies any anginal symptoms but admits to a very sedentary lifestyle. With regard to his tachycardia, he will need EP study. If he has developed LV dysfunction, he will need cardiac cath and possible ICD. If he has normal LV function. He will need an EP study and catheter ablation if inducible SVT.    Mikle Bosworth.D.

## 2016-03-23 NOTE — Progress Notes (Signed)
75 y/o male who underwent unsuccessful SVT ablation earlier today.   Called to see patient with rapid SVT with rates in 150s.  Patient in mild distress with ST depression on monitor.   Failed carotid sinus massage.  Pad placed on patient and adenosine ordered. Prior to giving adenosine was able to break patient with Valsalva.   Patient in sinus rhythm with frequent PACs. Given lopressor 5mg  IV.  I discussed with Dr. Lovena Le and decision made to start flecainide 100 bid.   Total CCT 35 mins.   Randalyn Ahmed,MD 8:08 PM

## 2016-03-24 ENCOUNTER — Other Ambulatory Visit: Payer: Self-pay | Admitting: Nurse Practitioner

## 2016-03-24 DIAGNOSIS — I471 Supraventricular tachycardia: Secondary | ICD-10-CM | POA: Diagnosis not present

## 2016-03-24 DIAGNOSIS — I456 Pre-excitation syndrome: Secondary | ICD-10-CM | POA: Diagnosis not present

## 2016-03-24 DIAGNOSIS — E785 Hyperlipidemia, unspecified: Secondary | ICD-10-CM | POA: Diagnosis not present

## 2016-03-24 DIAGNOSIS — I10 Essential (primary) hypertension: Secondary | ICD-10-CM | POA: Diagnosis not present

## 2016-03-24 DIAGNOSIS — Z6839 Body mass index (BMI) 39.0-39.9, adult: Secondary | ICD-10-CM | POA: Diagnosis not present

## 2016-03-24 DIAGNOSIS — R791 Abnormal coagulation profile: Secondary | ICD-10-CM | POA: Diagnosis not present

## 2016-03-24 DIAGNOSIS — J449 Chronic obstructive pulmonary disease, unspecified: Secondary | ICD-10-CM | POA: Diagnosis not present

## 2016-03-24 DIAGNOSIS — G4733 Obstructive sleep apnea (adult) (pediatric): Secondary | ICD-10-CM | POA: Diagnosis not present

## 2016-03-24 LAB — BASIC METABOLIC PANEL
ANION GAP: 6 (ref 5–15)
BUN: 9 mg/dL (ref 6–20)
CALCIUM: 7.2 mg/dL — AB (ref 8.9–10.3)
CO2: 29 mmol/L (ref 22–32)
CREATININE: 0.94 mg/dL (ref 0.61–1.24)
Chloride: 103 mmol/L (ref 101–111)
GLUCOSE: 89 mg/dL (ref 65–99)
Potassium: 3.7 mmol/L (ref 3.5–5.1)
Sodium: 138 mmol/L (ref 135–145)

## 2016-03-24 LAB — CBC
HCT: 34.7 % — ABNORMAL LOW (ref 39.0–52.0)
Hemoglobin: 10.9 g/dL — ABNORMAL LOW (ref 13.0–17.0)
MCH: 29.2 pg (ref 26.0–34.0)
MCHC: 31.4 g/dL (ref 30.0–36.0)
MCV: 93 fL (ref 78.0–100.0)
PLATELETS: 142 10*3/uL — AB (ref 150–400)
RBC: 3.73 MIL/uL — ABNORMAL LOW (ref 4.22–5.81)
RDW: 14.6 % (ref 11.5–15.5)
WBC: 5.5 10*3/uL (ref 4.0–10.5)

## 2016-03-24 LAB — POCT ACTIVATED CLOTTING TIME: ACTIVATED CLOTTING TIME: 169 s

## 2016-03-24 LAB — MAGNESIUM: Magnesium: 2.1 mg/dL (ref 1.7–2.4)

## 2016-03-24 MED ORDER — ALPRAZOLAM 0.5 MG PO TABS
0.7500 mg | ORAL_TABLET | Freq: Once | ORAL | Status: AC
  Administered 2016-03-24: 10:00:00 0.75 mg via ORAL
  Filled 2016-03-24: qty 1

## 2016-03-24 MED ORDER — FLECAINIDE ACETATE 100 MG PO TABS
100.0000 mg | ORAL_TABLET | Freq: Two times a day (BID) | ORAL | 1 refills | Status: DC
Start: 2016-03-24 — End: 2016-03-24

## 2016-03-24 MED FILL — Heparin Sodium (Porcine) 2 Unit/ML in Sodium Chloride 0.9%: INTRAMUSCULAR | Qty: 500 | Status: AC

## 2016-03-24 NOTE — Significant Event (Signed)
Rapid Response Event Note RN called due to pt went back into SVT when trying to pull himself up in the bed.  Overview: Time Called: 2116 Arrival Time: 2118 Event Type: Cardiac  Initial Focused Assessment: Upon arrival pt's HR 142, EKG and monitor reading SVT, pt asymptomatic, skin warm and dry. Verbal order for 5mg  Lopressor IVP given just as I arrived to room resulting on no change in pt's HR. Dr. Aundra Dubin paged and made aware, new order given and completed for 10 mg Diltiazem IVP no change IN HR, Dr. Aundra Dubin re paged, just prior to returning call pt converted to NSR 70-90's with couplet PVC's every 5th beat, associated with 3 episodes of 5-60 beat run of VT. Order given and completed for an additional 5mg  Lopressor IVP and transfer to ICU.   Interventions: Pt transferred to 4n19.    Event Summary: Name of Physician Notified: Dr. Aundra Dubin  at 2136    at    Outcome: Transferred (Comment) [phone])  Event End Time: 2330  [place] Pickup

## 2016-03-24 NOTE — Progress Notes (Signed)
At approximately 2115 pt pulled himself up in bed to take medications and went back into SVT. An EKG was obtained and Aundra Dubin was contacted. Lopressor and Cardizem were administered and the pt returned to sinus rhythm with frequent PVC's. Pt was eventually transferred to ICU.

## 2016-03-24 NOTE — Progress Notes (Signed)
Discharge paperwork gone over in detail with patient and daughter Otila Kluver. All questions answered to patient satisfaction. Encouraged patient to weigh himself daily and when to call the doctor. Educated patient and daughter on Femoral site care. Educated patient on a low salt  Cardiac diet as prescribed. Answered patients questions on new medications. Patient's daughter brought his portable o2 tank and he was placed on 2 LNC. Telemetry discontinued, iv removed intact. Patient discharged to home by way of wheelchair with daughter.

## 2016-03-24 NOTE — Progress Notes (Signed)
Ambulated patient to the bathroom. Pt steady on his feet, just 1 assist because of the cords.

## 2016-03-24 NOTE — Progress Notes (Signed)
Pt has home oxygen w [facility]. He has conc and portables. sats dec to 70's this am on room air. fam to bring port for transport home.

## 2016-03-25 ENCOUNTER — Telehealth: Payer: Self-pay | Admitting: Internal Medicine

## 2016-03-25 NOTE — Telephone Encounter (Signed)
Called, spoke with pt. Pt stated his heart was racing after the procedure, but now is it not. Pt wanted to know what follow-up would be after unsuccesf ablation. Informed pt is scheduled for f/u appt with Dr. Lovena Le on 04/15/16. Dr. Lovena Le will have recommendation at f/u appt. Informed pt if heart starts racing, call our office. We will get pt in for a sooner appt.  Pt verbalized understanding and agreed with plan.

## 2016-03-25 NOTE — Telephone Encounter (Signed)
New message:  Pt had EPT procedure on Monday and ablation and the pt said procedure was unsuccessful, started having rapid heart rate 8 hours after procedure. Dr. Lovena Le put pt on Flecainide twice a day. Pt is asking what is next?

## 2016-03-25 NOTE — Telephone Encounter (Signed)
New message  Pt call requesting to speak with RN about previous telephone note. Please call back to discuss

## 2016-03-26 DIAGNOSIS — Z4800 Encounter for change or removal of nonsurgical wound dressing: Secondary | ICD-10-CM | POA: Diagnosis not present

## 2016-03-26 DIAGNOSIS — G4733 Obstructive sleep apnea (adult) (pediatric): Secondary | ICD-10-CM | POA: Diagnosis not present

## 2016-03-27 ENCOUNTER — Telehealth: Payer: Self-pay | Admitting: Internal Medicine

## 2016-03-27 NOTE — Telephone Encounter (Signed)
Calling stating he was discharged Tues 12/12 from hospital after unsuccessful attempt at ablation.  Was started on Flecainide 100 mg BID to control rhythm.  States ever since he has been home he has not had an appetite. States was eating good in hospital but since he has been home has had to force himself to eat. States he has lost about 5 lbs since he was admitted to hospital.  Also states he is depressed because couldn't do ablation.  States he feels like he is in rhythm-not having any palpitations. Advised will speak with Dr. Caryl Comes (who is in office) and call him back.  Spoke w/Dr. Caryl Comes and pharmacist who both state since he has been on medication only 3 days that it is unlikely that Flecainide is causing the decrease in appetite.  Advised pt to try to force himself to eat so will not get dehydrated and get electrolytes out of balance.  He is scheduled to come to office Wed 12/20 for EKG.  Advised him to try to increase appetite over the weekend and if not feeling any better by Monday to call back.  He verbalizes understanding and will try to force himself to eat.

## 2016-03-27 NOTE — Telephone Encounter (Signed)
Pt called he is concerned about his loss of appetite   His incision is sore  Please give him a call.

## 2016-04-01 ENCOUNTER — Ambulatory Visit (INDEPENDENT_AMBULATORY_CARE_PROVIDER_SITE_OTHER): Payer: Commercial Managed Care - HMO

## 2016-04-01 VITALS — BP 128/78 | HR 60 | Ht 71.0 in | Wt 284.8 lb

## 2016-04-01 DIAGNOSIS — I471 Supraventricular tachycardia: Secondary | ICD-10-CM | POA: Diagnosis not present

## 2016-04-01 NOTE — Progress Notes (Signed)
Patient in today for follow-up EKG after STARTING FLECAINIDE 100 mg BID. He denies CP, palpitations, lightheadedness. He states he has some worsening SOB but he feels it is due to deconditioning after no activity for almost 2 months and his COPD. He also reports "pretty bad bruising" on his leg procedure was done. Yellow and maroon bruising inspected from groin down to knee and around to R hip. Bruising is soft. Reviewed EKG and symptoms with Chanetta Marshall, NP. Vent rate: 67 PR interval: 226 ms QRS duration: 134 ms QT/QTc: 440/464 ms PRT axis: 38   -50   59  Per Amber, instructed patient to continue current regimen for now and Dr. Tanna Furry nurse will call if he has further instruction prior to follow-up appointment 1/3. The patient understands to call prior to appointment if he experiences dizziness, fluttering, fainting. He also understands to continue to monitor bruising and to palpate and to call if area becomes hardened at all. He was grateful for assistance.

## 2016-04-02 ENCOUNTER — Telehealth: Payer: Self-pay | Admitting: Internal Medicine

## 2016-04-02 NOTE — Telephone Encounter (Signed)
Discussed with Dr. Caryl Comes and it was suggested by nurse for patient to take tylenol and Dr. Caryl Comes agreed. Patient advised that the stinging and burning sensation should improve and if it doesn't to contact our office. Patient informed to use tylenol as needed and verbalized understanding of plan.

## 2016-04-02 NOTE — Telephone Encounter (Signed)
New Message  Patient had ablation on 03/23/16.  Complaining of buring on inside of leg since yesterday and patient feeling stinging sensation please advise.

## 2016-04-02 NOTE — Telephone Encounter (Signed)
Patient c/o bruising below incision site from ablation that was done on last week that goes down below his knee. Patient said he was in the office on yesterday and showed it to the nurse and was told that it looked to be healing well. Patient c/o soreness in right leg and said he is having to limp on his right leg. Patient also said the Inner side of his right leg has a stinging and burning sensation rated 4-5/10. Patient said that even a light touch such as the sheet causes the burning and stinging. Patient is asking what he should take for this pain. Message will be taken to Dr. Caryl Comes for advise.

## 2016-04-08 DIAGNOSIS — G4733 Obstructive sleep apnea (adult) (pediatric): Secondary | ICD-10-CM | POA: Diagnosis not present

## 2016-04-15 ENCOUNTER — Encounter: Payer: Self-pay | Admitting: Internal Medicine

## 2016-04-15 ENCOUNTER — Ambulatory Visit (INDEPENDENT_AMBULATORY_CARE_PROVIDER_SITE_OTHER): Payer: Commercial Managed Care - HMO | Admitting: Internal Medicine

## 2016-04-15 ENCOUNTER — Telehealth: Payer: Self-pay

## 2016-04-15 VITALS — BP 148/90 | HR 64 | Ht 71.0 in | Wt 282.4 lb

## 2016-04-15 DIAGNOSIS — I471 Supraventricular tachycardia: Secondary | ICD-10-CM

## 2016-04-15 MED ORDER — FLECAINIDE ACETATE 100 MG PO TABS: ORAL_TABLET | ORAL | 1 refills | Status: DC

## 2016-04-15 NOTE — Progress Notes (Signed)
HPI Chad Hogan returns today for followup. He is a pleasant 76 yo man with a h/o WPW syndrome who presented with a wide QRS tachycardia and underwent cardiac cath with no CAD. He underwent EP study and was found to have anti-dromic SVT using a left sided AP. We could make his pathway conduction go away but it would always return. Ultimately, he pushed up against the limits of flouroscopy exposure and the procedure was stopped. He was placed on flecainide. He returns today for followup. In the interim, he had a large bruise in his right leg which has resolved. He has not had more SVT. He is tolerating his flecainide. He has had continued hemorrhoidal bleeding and would like to stop his Eliquis. Allergies  Allergen Reactions  . Eliquis [Apixaban] Other (See Comments)    Bleeding event  . Codeine Other (See Comments)    Cant remember     Current Outpatient Prescriptions  Medication Sig Dispense Refill  . ALPRAZolam (XANAX) 0.5 MG tablet Take 0.75 mg by mouth at bedtime.     . ferrous sulfate 325 (65 FE) MG tablet Take 1 tablet by mouth daily.  3  . flecainide (TAMBOCOR) 100 MG tablet TAKE 1 TABLET(100 MG) BY MOUTH EVERY 12 HOURS 180 tablet 1  . Fluticasone-Salmeterol (ADVAIR DISKUS) 250-50 MCG/DOSE AEPB Inhale 1 puff into the lungs every 12 (twelve) hours.      . furosemide (LASIX) 40 MG tablet Take 40 mg by mouth daily.     Marland Kitchen losartan (COZAAR) 100 MG tablet Take 100 mg by mouth daily.     . Methylcobalamin (B-12) 5000 MCG TBDP Take 5,000 Units by mouth daily.    . metoprolol (LOPRESSOR) 25 MG tablet Take 0.5 tablets (12.5 mg total) by mouth 2 (two) times daily. 30 tablet 3  . omeprazole (PRILOSEC) 20 MG capsule Take 20 mg by mouth daily.   1  . OVER THE COUNTER MEDICATION Inhale 1 application into the lungs at bedtime. CPAP with O2    . rosuvastatin (CRESTOR) 10 MG tablet Take 10 mg by mouth at bedtime.     Marland Kitchen tiotropium (SPIRIVA) 18 MCG inhalation capsule Place 18 mcg into inhaler and  inhale daily.      . vitamin C (ASCORBIC ACID) 500 MG tablet Take 500 mg by mouth daily.      No current facility-administered medications for this visit.      Past Medical History:  Diagnosis Date  . Anxiety   . COPD (chronic obstructive pulmonary disease) ([place])   . HLD (hyperlipidemia)   . HTN (hypertension)   . Ischemic heart disease   . Obesity   . OSA (obstructive sleep apnea)   . Palpitation   . PVC (premature ventricular contraction)   . SOB (shortness of breath)   . SVT (supraventricular tachycardia) ([place])   . Umbilical hernia   . WPW (Wolff-Parkinson-White syndrome)     ROS:   All systems reviewed and negative except as noted in the HPI.   Past Surgical History:  Procedure Laterality Date  . CARDIAC CATHETERIZATION N/A 03/16/2016   Procedure: Left Heart Cath and Coronary Angiography;  Surgeon: Troy Sine, MD;  Location: [facility];  Service: Cardiovascular;  Laterality: N/A;  . COLONOSCOPY WITH PROPOFOL N/A 06/11/2014   Procedure: COLONOSCOPY WITH PROPOFOL;  Surgeon: Inda Castle, MD;  Location: [facility];  Service: Endoscopy;  Laterality: N/A;  . ELECTROPHYSIOLOGIC STUDY N/A 03/23/2016   Procedure: Electrophysiology Study;  Surgeon: Evans Lance, MD;  Location: [place] [facility];  Service: Cardiovascular;  Laterality: N/A;  . ELECTROPHYSIOLOGIC STUDY N/A 03/23/2016   Procedure: SVT Ablation;  Surgeon: Evans Lance, MD;  Location: [place] [facility];  Service: Cardiovascular;  Laterality: N/A;  . ESOPHAGOGASTRODUODENOSCOPY (EGD) WITH PROPOFOL N/A 06/11/2014   Procedure: ESOPHAGOGASTRODUODENOSCOPY (EGD) WITH PROPOFOL;  Surgeon: Inda Castle, MD;  Location: [facility];  Service: Endoscopy;  Laterality: N/A;  . HERNIA REPAIR       Family History  Problem Relation Age of Onset  . Liver cancer    . Heart disease       Social History   Social History  . Marital status: Divorced    Spouse name: N/A  . Number of children: 2  . Years  of education: N/A   Occupational History  . retired  Psychologist, prison and probation services   Social History Main Topics  . Smoking status: Former Smoker    Quit date: 04/13/1996  . Smokeless tobacco: Never Used  . Alcohol use No  . Drug use: No  . Sexual activity: Not Currently   Other Topics Concern  . Not on file   Social History Narrative  . No narrative on file     Ht 5\' 11"  (1.803 m)   Wt 282 lb 6.4 oz (128.1 kg)   BMI 39.39 kg/m   Physical Exam: BP - 148/90, P - 64, R - 18 obese appearing 76 yo man, NAD HEENT: Unremarkable Neck:  6 cm JVD, no thyromegally Lymphatics:  No adenopathy Back:  No CVA tenderness Lungs:  Clear with no wheezes HEART:  Regular rate rhythm, no murmurs, no rubs, no clicks Abd:  soft, positive bowel sounds, no organomegally, no rebound, no guarding Ext:  2 plus pulses, no edema, no cyanosis, no clubbing Skin:  No rashes no nodules, no redness on the back Neuro:  CN II through XII intact, motor grossly intact  ECG -   Assess/Plan: 1. WPW syndrome - he is doing well since his ablation. I will ask him to continue his flecainide. He will not undergo exercise testing as he cannot walk. 2. Atrial flutter - he had this during the EP study when we paced his heart fast. While he could have recurrent atrial flutter, He has had some GI bleeding. I will ask him to stop taking the Eliquis. 3. Obesity - I have asked him to lose weight. If we have to repeat his ablation, he will be easier to treat if he has lost some weight. 4. HTN - His blood pressure is elevated a bit. He is encouraged to reduce his salt intake and to lose weight.  Chad Hogan.D.

## 2016-04-15 NOTE — Telephone Encounter (Signed)
Called, left voice message. Informed pt he left his Rx here for Flecainide. Requested return call.

## 2016-04-15 NOTE — Patient Instructions (Addendum)
Medication Instructions:  Your physician has recommended you make the following change in your medication:  1) STOP Eliquis   Labwork: None Ordered   Testing/Procedures: None Ordered   Follow-Up: Your physician recommends that you schedule a follow-up appointment with Dr. Henrene Pastor with [facility] - Phone: [phone].  Your physician recommends that you schedule a follow-up appointment in: 3 months with Dr. Lovena Le     Any Other Special Instructions Will Be Listed Below (If Applicable). Lose weight    If you need a refill on your cardiac medications before your next appointment, please call your pharmacy.

## 2016-04-16 NOTE — Telephone Encounter (Signed)
Follow Up Call    Mr.Harris is returning a call . Please call

## 2016-04-16 NOTE — Telephone Encounter (Signed)
Called, spoke with pt. Pt requested to mail his Rx of Flecainide to him. Informed I will send it out today. Our mail system takes about a week to get stamped and mailed out. Pt verbalized understanding.

## 2016-04-20 ENCOUNTER — Telehealth: Payer: Self-pay | Admitting: Internal Medicine

## 2016-04-20 NOTE — Telephone Encounter (Signed)
Called pt. Informed our mail system takes longer than normal. Rx was sent to be mailed on 04/16/16. Informed pt to call back if not received in 3-4 days. Pt verbalized understanding.

## 2016-04-20 NOTE — Telephone Encounter (Signed)
New Message     Did you mail the prescription yet? FLECAINIDE 100 MG 2 X DAY

## 2016-05-01 ENCOUNTER — Telehealth: Payer: Self-pay | Admitting: Internal Medicine

## 2016-05-01 NOTE — Telephone Encounter (Signed)
Chad Hogan is calling because he states Dr.Taylor prescribed him to flecainide and he is experiencing several different side-effects. He states that he has noticed an increase in the shaking of his hands and also he felt like he was going to faint this morning. Please call,t hanks.

## 2016-05-01 NOTE — Telephone Encounter (Signed)
Called, spoke with pt. Informed pt Dr. Lovena Le recommended pt to hold Flecainide for 3 days and if pt is better, switch to Rythmol. Pt declined to hold med. Pt stated he is feeling somewhat better now. Pt stated is would rather try and stay on Flecainide. Inform pt to call our office if symptoms worsen. Informed pt to call on Monday (05/04/16) to let us know how he is feeling. Pt verbalized understanding and agreed with plan.

## 2016-05-06 MED ORDER — PROPAFENONE HCL ER 325 MG PO CP12: 325.0000 mg | ORAL_CAPSULE | Freq: Two times a day (BID) | ORAL | 0 refills | Status: DC

## 2016-05-06 NOTE — Telephone Encounter (Signed)
Chad Hogan is calling back to let you know how he is feeling . Please call

## 2016-05-06 NOTE — Telephone Encounter (Signed)
Called, spoke with pt. Pt stated he stopped the Flecainide yesterday. Pt stated his symptoms of hand shaking have improved. Informed will forward info to Dr. Lovena Le.   Dr. Lovena Le stated pt should hold Flecainide for 3 days and then start Rythmol 325 twice a day. Informed pt to discontinue use of Flecainide (pt understands not to take today or tomorrow). Pt will start Rythmol 325 twice daily on Friday (05/08/16). Pt requested to send in 1 30 day supply to [facility]. If pt tolerates medication, will send Rx refill to [facility]. Informed pt will need EKG 1 week from 06/08/16. I offered pt appt on 05/15/16 (because I am in f/u that morning) but pt declined. Informed someone from our office will contact pt to set up. Pt will call our office to let us know how he is tolerating the medication.Pt verbalized understanding and agreed with plan.

## 2016-05-09 DIAGNOSIS — G4733 Obstructive sleep apnea (adult) (pediatric): Secondary | ICD-10-CM | POA: Diagnosis not present

## 2016-05-13 DIAGNOSIS — G4700 Insomnia, unspecified: Secondary | ICD-10-CM | POA: Diagnosis not present

## 2016-05-13 DIAGNOSIS — I456 Pre-excitation syndrome: Secondary | ICD-10-CM | POA: Diagnosis not present

## 2016-05-19 ENCOUNTER — Telehealth: Payer: Self-pay | Admitting: Internal Medicine

## 2016-05-19 NOTE — Telephone Encounter (Signed)
Pt c/o medication issue:  1. Name of Medication: propafenone   2. How are you currently taking this medication (dosage and times per day)? 325mg  2xday  3. Are you having a reaction (difficulty breathing--STAT)? no  4. What is your medication issue? Headache and his heart is doing flip flops on and off 1-3x a day (he said its not uncommon)  He is asking for renee, because she knows what Im going through

## 2016-05-19 NOTE — Telephone Encounter (Signed)
I contacted the pt and he prefers to speak with Joseph Art since she is aware of his ongoing issues.  I asked him if he had been contacted to have an EKG performed since he started Rythmol 05/08/16.  He said someone did contact him but scheduled it for March 6th.  I advised him that he is due to have an EKG performed now. I scheduled him for an EKG appointment on 05/20/16 at 1:00 PM since Dr Lovena Le will be in the office to review.

## 2016-05-20 ENCOUNTER — Ambulatory Visit (INDEPENDENT_AMBULATORY_CARE_PROVIDER_SITE_OTHER): Payer: Medicare HMO

## 2016-05-20 VITALS — BP 172/88 | HR 65 | Wt 276.0 lb

## 2016-05-20 DIAGNOSIS — I471 Supraventricular tachycardia: Secondary | ICD-10-CM

## 2016-05-20 DIAGNOSIS — Z79899 Other long term (current) drug therapy: Secondary | ICD-10-CM | POA: Diagnosis not present

## 2016-05-20 NOTE — Patient Instructions (Addendum)
Medication Instructions:  Your physician recommends that you continue on your current medications as directed. Please refer to the Current Medication list given to you today.   Labwork: NONE  Testing/Procedures: Your physician has recommended that you wear a holter monitor. Holter monitors are medical devices that record the heart's electrical activity. Doctors most often use these monitors to diagnose arrhythmias. Arrhythmias are problems with the speed or rhythm of the heartbeat. The monitor is a small, portable device. You can wear one while you do your normal daily activities. This is usually used to diagnose what is causing palpitations/syncope (passing out).    Follow-Up: Your physician recommends that you schedule a follow-up appointment in: 2 weeks with Dr. Lovena Le.   1.) Reason for visit: EKG for switching Flecainide to Rythmol for SVT.  2.) Name of MD requesting visit: Dr. Lovena Le  3.) H&P: Patient stopped taking Flecainide d/t side effects and started taking Rythmol 325 mg BID for SVT  4.) ROS related to problem: Patient is still complaining of shakiness in his hands, headaches, nervousness, and his heart still skipping. The patient states that his BP has been elevated as well.   5.) Assessment and plan per MD: Dr. Lovena Le made aware of patient's symptoms and he reviewed his EKG. Dr. Lovena Le wants to have the patient wear a 48 hour holter and follow up with him in 2 weeks.

## 2016-06-01 ENCOUNTER — Encounter: Payer: Self-pay | Admitting: Internal Medicine

## 2016-06-01 ENCOUNTER — Other Ambulatory Visit: Payer: Self-pay

## 2016-06-01 ENCOUNTER — Ambulatory Visit (INDEPENDENT_AMBULATORY_CARE_PROVIDER_SITE_OTHER): Payer: Medicare HMO | Admitting: Internal Medicine

## 2016-06-01 ENCOUNTER — Encounter (INDEPENDENT_AMBULATORY_CARE_PROVIDER_SITE_OTHER): Payer: Self-pay

## 2016-06-01 VITALS — BP 142/96 | HR 79 | Ht 71.0 in | Wt 265.6 lb

## 2016-06-01 DIAGNOSIS — I471 Supraventricular tachycardia: Secondary | ICD-10-CM

## 2016-06-01 MED ORDER — PROPAFENONE HCL 300 MG PO TABS: 300.0000 mg | ORAL_TABLET | Freq: Two times a day (BID) | ORAL | 0 refills | Status: DC

## 2016-06-01 NOTE — Patient Instructions (Addendum)
Medication Instructions:  Your physician has recommended you make the following change in your medication:  1) STOP Rythmol SR  2) STAT Rythmol 300 mg twice daily   Labwork: None Ordered   Testing/Procedures: None Ordered   Follow-Up: Your physician wants you to follow-up in: 6 months with Dr. Lovena Le. You will receive a reminder letter in the mail two months in advance. If you don't receive a letter, please call our office to schedule the follow-up appointment.    Any Other Special Instructions Will Be Listed Below (If Applicable).     If you need a refill on your cardiac medications before your next appointment, please call your pharmacy.

## 2016-06-01 NOTE — Progress Notes (Signed)
HPI Chad Hogan returns today for followup. He is a pleasant 76 yo man with a h/o WPW syndrome who presented with a wide QRS tachycardia and underwent cardiac cath with no CAD. He underwent EP study and was found to have anti-dromic SVT using a left sided AP. We could make his pathway conduction go away but it would always return. Ultimately, he pushed up against the limits of flouroscopy exposure and the procedure was stopped. He was placed on flecainide but was intolerant and was then placed on Rhythmol. He wore a cardiac monitor and was noted to have PVC's which were infrequent. No SVT. No pauses. He is anxious stating his has been on medication for anxiety since he was 34.  Allergies  Allergen Reactions  . Eliquis [Apixaban] Other (See Comments)    Bleeding event  . Codeine Other (See Comments)    Cant remember     Current Outpatient Prescriptions  Medication Sig Dispense Refill  . ALPRAZolam (XANAX) 1 MG tablet Take 1 tablet by mouth daily.  0  . ferrous sulfate 325 (65 FE) MG tablet Take 1 tablet by mouth daily.  3  . Fluticasone-Salmeterol (ADVAIR DISKUS) 250-50 MCG/DOSE AEPB Inhale 1 puff into the lungs every 12 (twelve) hours.      . furosemide (LASIX) 40 MG tablet Take 40 mg by mouth daily.     Marland Kitchen losartan (COZAAR) 100 MG tablet Take 100 mg by mouth daily.     . Methylcobalamin (B-12) 5000 MCG TBDP Take 5,000 Units by mouth daily.    . metoprolol (LOPRESSOR) 25 MG tablet Take 0.5 tablets (12.5 mg total) by mouth 2 (two) times daily. 30 tablet 3  . omeprazole (PRILOSEC) 20 MG capsule Take 20 mg by mouth daily.   1  . OVER THE COUNTER MEDICATION Inhale 1 application into the lungs at bedtime. CPAP with O2    . propafenone (RYTHMOL) 300 MG tablet Take 1 tablet (300 mg total) by mouth 2 (two) times daily. 60 tablet 0  . rosuvastatin (CRESTOR) 10 MG tablet Take 10 mg by mouth at bedtime.     Marland Kitchen tiotropium (SPIRIVA) 18 MCG inhalation capsule Place 18 mcg into inhaler and inhale  daily.      . vitamin C (ASCORBIC ACID) 500 MG tablet Take 500 mg by mouth daily.      No current facility-administered medications for this visit.      Past Medical History:  Diagnosis Date  . Anxiety   . COPD (chronic obstructive pulmonary disease) ([place])   . HLD (hyperlipidemia)   . HTN (hypertension)   . Ischemic heart disease   . Obesity   . OSA (obstructive sleep apnea)   . Palpitation   . PVC (premature ventricular contraction)   . SOB (shortness of breath)   . SVT (supraventricular tachycardia) ([place])   . Umbilical hernia   . WPW (Wolff-Parkinson-White syndrome)     ROS:   All systems reviewed and negative except as noted in the HPI.   Past Surgical History:  Procedure Laterality Date  . CARDIAC CATHETERIZATION N/A 03/16/2016   Procedure: Left Heart Cath and Coronary Angiography;  Surgeon: Troy Sine, MD;  Location: [place] [facility];  Service: Cardiovascular;  Laterality: N/A;  . COLONOSCOPY WITH PROPOFOL N/A 06/11/2014   Procedure: COLONOSCOPY WITH PROPOFOL;  Surgeon: Inda Castle, MD;  Location: [facility];  Service: Endoscopy;  Laterality: N/A;  . ELECTROPHYSIOLOGIC STUDY N/A 03/23/2016   Procedure: Electrophysiology Study;  Surgeon: Evans Lance, MD;  Location: [place] [facility];  Service: Cardiovascular;  Laterality: N/A;  . ELECTROPHYSIOLOGIC STUDY N/A 03/23/2016   Procedure: SVT Ablation;  Surgeon: Evans Lance, MD;  Location: [place] [facility];  Service: Cardiovascular;  Laterality: N/A;  . ESOPHAGOGASTRODUODENOSCOPY (EGD) WITH PROPOFOL N/A 06/11/2014   Procedure: ESOPHAGOGASTRODUODENOSCOPY (EGD) WITH PROPOFOL;  Surgeon: Inda Castle, MD;  [facility]: [facility];  Service: Endoscopy;  Laterality: N/A;  . HERNIA REPAIR       Family History  Problem Relation Age of Onset  . Liver cancer    . Heart disease       Social History   Social History  . Marital status: Divorced    Spouse name: N/A  . Number of children: 2  . Years of  education: N/A   Occupational History  . retired  Psychologist, prison and probation services   Social History Main Topics  . Smoking status: Former Smoker    Quit date: 04/13/1996  . Smokeless tobacco: Never Used  . Alcohol use No  . Drug use: No  . Sexual activity: Not Currently   Other Topics Concern  . Not on file   Social History Narrative  . No narrative on file     BP (!) 142/96   Pulse 79   Ht 5\' 11"  (1.803 m)   Wt 265 lb 9.6 oz (120.5 kg)   SpO2 90%   BMI 37.04 kg/m   Physical Exam: BP - 142/96, P - 79, R - 18 obese appearing 76 yo man, NAD HEENT: Unremarkable Neck:  6 cm JVD, no thyromegally Lymphatics:  No adenopathy Back:  No CVA tenderness Lungs:  Clear with no wheezes HEART:  Regular rate rhythm, no murmurs, no rubs, no clicks Abd:  soft, positive bowel sounds, no organomegally, no rebound, no guarding Ext:  2 plus pulses, no edema, no cyanosis, no clubbing Skin:  No rashes no nodules, no redness on the back Neuro:  CN II through XII intact, motor grossly intact  ECG -   Assess/Plan: 1. WPW syndrome - he is doing well since his ablation. I will ask him to continue his rhythmol. He will not undergo exercise testing as he cannot walk. 2. Atrial flutter - he had this during the EP study when we paced his heart fast. While he could have recurrent atrial flutter, He has had some GI bleeding. I will ask him to stop taking the Eliquis. 3. Obesity - I have asked him to lose weight. He has lost 8 lbs. If we have to repeat his ablation, he will be easier to treat if he has lost some weight. 4. HTN - His blood pressure is elevated a bit. He is encouraged to reduce his salt intake and to lose weight.  Mikle Bosworth.D.

## 2016-06-09 DIAGNOSIS — G4733 Obstructive sleep apnea (adult) (pediatric): Secondary | ICD-10-CM | POA: Diagnosis not present

## 2016-06-23 ENCOUNTER — Telehealth: Payer: Self-pay | Admitting: Internal Medicine

## 2016-06-23 NOTE — Telephone Encounter (Signed)
New Message:    Pt said he was supposed to call you back in 30 days,so you could get his medicine(Propafenone) that he is taking.

## 2016-06-25 MED ORDER — PROPAFENONE HCL 300 MG PO TABS: 300.0000 mg | ORAL_TABLET | Freq: Two times a day (BID) | ORAL | 3 refills | Status: DC

## 2016-06-25 NOTE — Telephone Encounter (Signed)
Returned call to patient and he states that Dr Lovena Le advised him to take Propafenone 300 mg bid.  He has tried this for 30 days and everything seems good.  He would like to have a 90 day supply called in.  I called in and he was very appreciative.

## 2016-07-03 ENCOUNTER — Telehealth: Payer: Self-pay | Admitting: Internal Medicine

## 2016-07-03 NOTE — Telephone Encounter (Signed)
New message     Pt confused does he need to make appt with DR Caryl Comes or Dr Lovena Le to follow up with or both?

## 2016-07-03 NOTE — Telephone Encounter (Signed)
Dr. Lovena Le,  Please see patient's question below-  He saw Dr. Caryl Comes 09/19/15 SVT ablation with you- 03/23/16 Office visit with you- 04/15/16 & 06/01/16. You stated you would see him back in 6 months- will he just continue to follow with you? Just let me know!  Thank you! Nira Conn

## 2016-07-07 DIAGNOSIS — G4733 Obstructive sleep apnea (adult) (pediatric): Secondary | ICD-10-CM | POA: Diagnosis not present

## 2016-07-08 ENCOUNTER — Ambulatory Visit: Payer: Commercial Managed Care - HMO | Admitting: Internal Medicine

## 2016-07-08 NOTE — Telephone Encounter (Signed)
Heather, we can do it either way. It probably makes sense for me to see him as he may ultimately require a second ablation. However, if the patient prefers to see Dr. Renaldo Reel then that would be fine as well. GT

## 2016-07-10 NOTE — Telephone Encounter (Signed)
I called and spoke with the patient and advised him of Dr. Tanna Furry recommendations to see him at this time. The patient is agreeable. He is due to see Dr. Lovena Le back in August. He is aware that we will notify him when the August schedule is available, but he may check any time to see if the schedule is out.  He is agreeable.

## 2016-07-17 DIAGNOSIS — L738 Other specified follicular disorders: Secondary | ICD-10-CM | POA: Diagnosis not present

## 2016-07-27 ENCOUNTER — Emergency Department (HOSPITAL_COMMUNITY)
Admission: EM | Admit: 2016-07-27 | Discharge: 2016-07-28 | Disposition: A | Discharge status: Home / Self Care | Payer: Medicare HMO | Attending: Emergency Medicine | Admitting: Emergency Medicine

## 2016-07-27 ENCOUNTER — Encounter (HOSPITAL_COMMUNITY): Payer: Self-pay | Admitting: Pharmacy Technician

## 2016-07-27 ENCOUNTER — Emergency Department (HOSPITAL_COMMUNITY): Payer: Medicare HMO

## 2016-07-27 DIAGNOSIS — R0902 Hypoxemia: Secondary | ICD-10-CM | POA: Diagnosis not present

## 2016-07-27 DIAGNOSIS — Z87891 Personal history of nicotine dependence: Secondary | ICD-10-CM | POA: Diagnosis not present

## 2016-07-27 DIAGNOSIS — N3091 Cystitis, unspecified with hematuria: Secondary | ICD-10-CM | POA: Insufficient documentation

## 2016-07-27 DIAGNOSIS — R319 Hematuria, unspecified: Secondary | ICD-10-CM

## 2016-07-27 DIAGNOSIS — N3090 Cystitis, unspecified without hematuria: Secondary | ICD-10-CM

## 2016-07-27 DIAGNOSIS — J449 Chronic obstructive pulmonary disease, unspecified: Secondary | ICD-10-CM | POA: Insufficient documentation

## 2016-07-27 DIAGNOSIS — I10 Essential (primary) hypertension: Secondary | ICD-10-CM | POA: Diagnosis not present

## 2016-07-27 DIAGNOSIS — Z79899 Other long term (current) drug therapy: Secondary | ICD-10-CM | POA: Insufficient documentation

## 2016-07-27 LAB — CBC WITH DIFFERENTIAL/PLATELET
BASOS ABS: 0 10*3/uL (ref 0.0–0.1)
Basophils Relative: 0 %
Eosinophils Absolute: 0.2 10*3/uL (ref 0.0–0.7)
Eosinophils Relative: 2 %
HCT: 49.3 % (ref 39.0–52.0)
Hemoglobin: 16.4 g/dL (ref 13.0–17.0)
LYMPHS ABS: 2.5 10*3/uL (ref 0.7–4.0)
LYMPHS PCT: 27 %
MCH: 30 pg (ref 26.0–34.0)
MCHC: 33.3 g/dL (ref 30.0–36.0)
MCV: 90.1 fL (ref 78.0–100.0)
MONO ABS: 0.6 10*3/uL (ref 0.1–1.0)
Monocytes Relative: 7 %
NEUTROS ABS: 5.9 10*3/uL (ref 1.7–7.7)
Neutrophils Relative %: 64 %
PLATELETS: 176 10*3/uL (ref 150–400)
RBC: 5.47 MIL/uL (ref 4.22–5.81)
RDW: 14.1 % (ref 11.5–15.5)
WBC: 9.2 10*3/uL (ref 4.0–10.5)

## 2016-07-27 LAB — COMPREHENSIVE METABOLIC PANEL
ALK PHOS: 134 U/L — AB (ref 38–126)
ALT: 39 U/L (ref 17–63)
ANION GAP: 11 (ref 5–15)
AST: 43 U/L — ABNORMAL HIGH (ref 15–41)
Albumin: 3.6 g/dL (ref 3.5–5.0)
BUN: 24 mg/dL — AB (ref 6–20)
CO2: 23 mmol/L (ref 22–32)
Calcium: 8.6 mg/dL — ABNORMAL LOW (ref 8.9–10.3)
Chloride: 103 mmol/L (ref 101–111)
Creatinine, Ser: 1.16 mg/dL (ref 0.61–1.24)
GFR, EST NON AFRICAN AMERICAN: 59 mL/min — AB (ref >60)
GLUCOSE: 99 mg/dL (ref 65–99)
Potassium: 4.5 mmol/L (ref 3.5–5.1)
Sodium: 137 mmol/L (ref 135–145)
Total Bilirubin: 0.6 mg/dL (ref 0.3–1.2)
Total Protein: 6.3 g/dL — ABNORMAL LOW (ref 6.5–8.1)

## 2016-07-27 LAB — URINALYSIS, ROUTINE W REFLEX MICROSCOPIC
BILIRUBIN URINE: NEGATIVE
Glucose, UA: NEGATIVE mg/dL
Ketones, ur: NEGATIVE mg/dL
Leukocytes, UA: NEGATIVE
NITRITE: NEGATIVE
PH: 5 (ref 5.0–8.0)
Protein, ur: 100 mg/dL — AB
SPECIFIC GRAVITY, URINE: 1.023 (ref 1.005–1.030)

## 2016-07-27 MED ORDER — IOPAMIDOL (ISOVUE-300) INJECTION 61%
100.0000 mL | Freq: Once | INTRAVENOUS | Status: AC | PRN
  Administered 2016-07-27: 100 mL via INTRAVENOUS

## 2016-07-27 NOTE — ED Provider Notes (Addendum)
[facility] DEPT Provider Note   CSN: [id number] Arrival date & time: 07/27/16  2043     History   Chief Complaint Chief Complaint  Patient presents with  . Hematuria    HPI Chad Hogan is a 76 y.o. male.   Hematuria  This is a new problem. The current episode started 1 to 2 hours ago. The problem occurs constantly. Pertinent negatives include no chest pain and no abdominal pain. Nothing aggravates the symptoms. Nothing relieves the symptoms. He has tried nothing for the symptoms. The treatment provided no relief.    Past Medical History:  Diagnosis Date  . Anxiety   . COPD (chronic obstructive pulmonary disease) ([place])   . HLD (hyperlipidemia)   . HTN (hypertension)   . Ischemic heart disease   . Obesity   . OSA (obstructive sleep apnea)   . Palpitation   . PVC (premature ventricular contraction)   . SOB (shortness of breath)   . SVT (supraventricular tachycardia) ([place])   . Umbilical hernia   . WPW (Wolff-Parkinson-White syndrome)     Patient Active Problem List   Diagnosis Date Noted  . SVT (supraventricular tachycardia) ([place]) 03/23/2016  . WPW syndrome 03/23/2016  . Bradycardia 03/11/2016  . Ventricular tachycardia ([place]) 03/11/2016  . Benign neoplasm of colon 06/11/2014  . Diverticulosis of colon without hemorrhage 06/11/2014  . Internal and external hemorrhoids without complication 59/56/3875  . Duodenal mass 06/11/2014  . History of iron deficiency anemia 05/02/2014  . Acute pulmonary embolism ([place]) 05/02/2014  . PALPITATIONS 02/11/2010  . ATYPICAL DEPRESSIVE DISORDER 08/21/2008  . PULMONARY FIBROSIS 09/15/2007  . Chronic diastolic heart failure ([place]) 09/15/2007  . HYPERLIPIDEMIA 08/16/2007  . SLEEP APNEA 08/16/2007  . Morbid obesity ([place]) 08/15/2007  . ANXIETY 08/15/2007  . HYPERTENSION 08/15/2007  . ISCHEMIC HEART DISEASE 08/15/2007  . WOLFF (WOLFE)-PARKINSON-WHITE (WPW) SYNDROME 08/15/2007  . PREMATURE VENTRICULAR CONTRACTIONS 08/15/2007  .  Chronic airway obstruction, not elsewhere classified 08/15/2007    Past Surgical History:  Procedure Laterality Date  . CARDIAC CATHETERIZATION N/A 03/16/2016   Procedure: Left Heart Cath and Coronary Angiography;  Surgeon: Troy Sine, MD;  Location: [place] [facility];  Service: Cardiovascular;  Laterality: N/A;  . COLONOSCOPY WITH PROPOFOL N/A 06/11/2014   Procedure: COLONOSCOPY WITH PROPOFOL;  Surgeon: Inda Castle, MD;  [facility];  Service: Endoscopy;  Laterality: N/A;  . ELECTROPHYSIOLOGIC STUDY N/A 03/23/2016   Procedure: Electrophysiology Study;  Surgeon: Evans Lance, MD;  Location: [place] [facility];  Service: Cardiovascular;  Laterality: N/A;  . ELECTROPHYSIOLOGIC STUDY N/A 03/23/2016   Procedure: SVT Ablation;  Surgeon: Evans Lance, MD;  Location: [place] [facility];  Service: Cardiovascular;  Laterality: N/A;  . ESOPHAGOGASTRODUODENOSCOPY (EGD) WITH PROPOFOL N/A 06/11/2014   Procedure: ESOPHAGOGASTRODUODENOSCOPY (EGD) WITH PROPOFOL;  Surgeon: Inda Castle, MD;  [facility];  Service: Endoscopy;  Laterality: N/A;  . HERNIA REPAIR         Home Medications    Prior to Admission medications   Medication Sig Start Date End Date Taking? Authorizing Provider  ALPRAZolam Duanne Moron) 1 MG tablet Take 1 tablet by mouth 2 (two) times daily as needed for anxiety.  05/15/16  Yes Historical Provider, MD  ferrous sulfate 325 (65 FE) MG tablet Take 1 tablet by mouth daily. 08/20/15  Yes Historical Provider, MD  Fluticasone-Salmeterol (ADVAIR DISKUS) 250-50 MCG/DOSE AEPB Inhale 1 puff into the lungs every 12 (twelve) hours.     Yes Historical Provider, MD  furosemide (LASIX) 40 MG tablet Take 40 mg by mouth daily.    Yes Historical Provider, MD  losartan (COZAAR) 100 MG tablet Take 100 mg by mouth daily.  12/10/11  Yes Historical Provider, MD  Methylcobalamin (B-12) 5000 MCG TBDP Take 5,000 Units by mouth daily.   Yes Historical Provider, MD  metoprolol  (LOPRESSOR) 25 MG tablet Take 0.5 tablets (12.5 mg total) by mouth 2 (two) times daily. Patient taking differently: Take 12.5-25 mg by mouth 2 (two) times daily. Take a half tablet in the morning and a whole tablet in the evening 03/17/16  Yes Renee Dyane Dustman, PA-C  omeprazole (PRILOSEC) 20 MG capsule Take 20 mg by mouth daily.  04/10/14  Yes Historical Provider, MD  OVER THE COUNTER MEDICATION Inhale 1 application into the lungs at bedtime. CPAP with O2   Yes Historical Provider, MD  propafenone (RYTHMOL) 300 MG tablet Take 1 tablet (300 mg total) by mouth 2 (two) times daily. 06/25/16  Yes Evans Lance, MD  rosuvastatin (CRESTOR) 10 MG tablet Take 10 mg by mouth at bedtime.    Yes Historical Provider, MD  tiotropium (SPIRIVA) 18 MCG inhalation capsule Place 18 mcg into inhaler and inhale daily.     Yes Historical Provider, MD  vitamin C (ASCORBIC ACID) 500 MG tablet Take 500 mg by mouth daily.    Yes Historical Provider, MD  cephALEXin (KEFLEX) 500 MG capsule Take 1 capsule (500 mg total) by mouth 4 (four) times daily. 07/28/16   Merrily Pew, MD    Family History Family History  Problem Relation Age of Onset  . Liver cancer    . Heart disease      Social History Social History  Substance Use Topics  . Smoking status: Former Smoker    Quit date: 04/13/1996  . Smokeless tobacco: Never Used  . Alcohol use No     Allergies   Eliquis [apixaban] and Codeine   Review of Systems Review of Systems  Cardiovascular: Negative for chest pain.  Gastrointestinal: Negative for abdominal pain.  Genitourinary: Positive for hematuria.  All other systems reviewed and are negative.    Physical Exam Updated Vital Signs BP (!) 144/77   Pulse 69   Temp 98.1 F (36.7 C) (Oral)   Resp (!) 21   SpO2 94%   Physical Exam  Constitutional: He appears well-developed and well-nourished.  HENT:  Head: Normocephalic and atraumatic.  Eyes: Conjunctivae and EOM are normal.  Neck: Normal range of  motion.  Cardiovascular: Normal rate.   Pulmonary/Chest: Effort normal. No respiratory distress.  Abdominal: Soft. Bowel sounds are normal. He exhibits no distension. There is no tenderness.  Musculoskeletal: Normal range of motion.  Neurological: He is alert.  Skin: Skin is warm and dry.  Nursing note and vitals reviewed.    ED Treatments / Results  Labs (all labs ordered are listed, but only abnormal results are displayed) Labs Reviewed  URINALYSIS, ROUTINE W REFLEX MICROSCOPIC - Abnormal; Notable for the following:       Result Value   Color, Urine BROWN (*)    APPearance CLOUDY (*)    Hgb urine dipstick MODERATE (*)    Protein, ur 100 (*)    Bacteria, UA RARE (*)    Squamous Epithelial / LPF 0-5 (*)    All other components within normal limits  CK - Abnormal; Notable for the following:    Total CK 45 (*)    All other components within normal limits  COMPREHENSIVE METABOLIC  PANEL - Abnormal; Notable for the following:    BUN 24 (*)    Calcium 8.6 (*)    Total Protein 6.3 (*)    AST 43 (*)    Alkaline Phosphatase 134 (*)    GFR calc non Af Amer 59 (*)    All other components within normal limits  CBC WITH DIFFERENTIAL/PLATELET    EKG  EKG Interpretation None       Radiology Ct Abdomen Pelvis W Contrast  Result Date: 07/28/2016 CLINICAL DATA:  Acute onset of hematuria.  Initial encounter. EXAM: CT ABDOMEN AND PELVIS WITH CONTRAST TECHNIQUE: Multidetector CT imaging of the abdomen and pelvis was performed using the standard protocol following bolus administration of intravenous contrast. CONTRAST:  132mL ISOVUE-300 IOPAMIDOL (ISOVUE-300) INJECTION 61% COMPARISON:  PET/CT performed 02/13/2014 FINDINGS: Lower chest: Bibasilar bronchiectasis is noted. Peripheral fibrosis is seen. The visualized portions of the mediastinum are unremarkable. Hepatobiliary: The liver is unremarkable in appearance. The gallbladder is unremarkable in appearance. The common bile duct remains  normal in caliber. Pancreas: The pancreas is within normal limits. Spleen: The spleen is unremarkable in appearance. Adrenals/Urinary Tract: The adrenal glands are unremarkable in appearance. The kidneys are within normal limits A 2.6 cm mildly heterogeneous isodense nodule is seen arising at the lower pole of the right kidney. Malignancy cannot be excluded. Ultrasound or MRI could be considered for further evaluation. There is no evidence of hydronephrosis. No renal or ureteral stones are identified. No perinephric stranding is seen. Stomach/Bowel: The stomach is unremarkable in appearance. The small bowel is within normal limits. The appendix is normal in caliber, without evidence of appendicitis. Scattered diverticulosis is noted along the descending and sigmoid colon, without evidence of diverticulitis. Vascular/Lymphatic: Scattered calcification is seen along the abdominal aorta and its branches. The abdominal aorta is otherwise grossly unremarkable. The inferior vena cava is grossly unremarkable. No retroperitoneal lymphadenopathy is seen. No pelvic sidewall lymphadenopathy is identified. Reproductive: The bladder is decompressed, with a Foley catheter in place. A stone is noted within the decompressed bladder. Apparent bladder wall thickening could reflect cystitis. The prostate is enlarged, measuring 6.2 cm in transverse dimension. Other: No additional soft tissue abnormalities are seen. Musculoskeletal: No acute osseous abnormalities are identified. The visualized musculature is unremarkable in appearance. IMPRESSION: 1. Apparent bladder wall thickening could reflect cystitis. Stone noted within the decompressed bladder. 2. 2.6 cm mildly heterogeneous isodense nodule arising at the lower pole of the right kidney. Malignancy cannot be excluded. Ultrasound or MRI could be considered for further evaluation. 3. Bibasilar bronchiectasis noted. Peripheral fibrosis seen at the lung bases. 4. Scattered  diverticulosis along the descending and sigmoid colon, without evidence of diverticulitis. 5. Enlarged prostate. 6. Scattered aortic atherosclerosis. Electronically Signed   By: Garald Balding M.D.   On: 07/28/2016 00:15    Procedures Procedures (including critical care time)  Medications Ordered in ED Medications  iopamidol (ISOVUE-300) 61 % injection 100 mL (100 mLs Intravenous Contrast Given 07/27/16 2345)  cephALEXin (KEFLEX) capsule 500 mg (500 mg Oral Given 07/28/16 0103)     Initial Impression / Assessment and Plan / ED Course  I have reviewed the triage vital signs and the nursing notes.  Pertinent labs & imaging results that were available during my care of the patient were reviewed by me and considered in my medical decision making (see chart for details).     hematuria possibly related to urinary tract infection however thought to be less likely. Also suspect possible bladder pathology since it  was thickened. Also has a new lesion when compared to 2015 on his right kidney.  He will follow-up with his primary doctor about the kidney lesion and follow-up with the urologist about the hematuria.  Patient was irrigated in the ER with clear urine afterwards. Suspect no ongoing active bleeding at this time.  Final Clinical Impressions(s) / ED Diagnoses   Final diagnoses:  Cystitis  Hematuria, unspecified type    New Prescriptions Discharge Medication List as of 07/28/2016 12:47 AM    START taking these medications   Details  cephALEXin (KEFLEX) 500 MG capsule Take 1 capsule (500 mg total) by mouth 4 (four) times daily., Starting Tue 07/28/2016, Print         Merrily Pew, MD 07/28/16 1356    Merrily Pew, MD 07/28/16 1356

## 2016-07-27 NOTE — ED Notes (Signed)
CMP needs to be redrawn, per lab sample was hemolyzed.

## 2016-07-27 NOTE — ED Notes (Signed)
Patient transported to CT 

## 2016-07-27 NOTE — ED Triage Notes (Signed)
Pt from home via [facility] with reports of hematuria. Pt states it was not mixed with urine. Pt denies taking blood thinners. Pt only medication change was completing course of bactrim this am (was taking for infection on leg).

## 2016-07-28 LAB — CK: Total CK: 45 U/L — ABNORMAL LOW (ref 49–397)

## 2016-07-28 MED ORDER — CEPHALEXIN 250 MG PO CAPS
500.0000 mg | ORAL_CAPSULE | Freq: Once | ORAL | Status: AC
  Administered 2016-07-28: 500 mg via ORAL
  Filled 2016-07-28: qty 2

## 2016-07-28 MED ORDER — CEPHALEXIN 500 MG PO CAPS: 500.0000 mg | ORAL_CAPSULE | Freq: Four times a day (QID) | ORAL | 0 refills | Status: DC

## 2016-07-28 NOTE — Discharge Instructions (Signed)
You had a lot of blood in your urine. This cleared up with irrigation however there is evidence of bladder wall thickening on your CT scan so I am treating you for an infection. You need to follow up with your urologist regarding this as well. There is also evidence of an abnormal lesion on the bottom of your right kidney that was not there in 2015. This needs to be evaluated as soon as possible with MRI or ultrasound to ensure is not cancer. Please bring this up with your primary doctor to be addressed at your physical on Friday.

## 2016-07-31 DIAGNOSIS — J479 Bronchiectasis, uncomplicated: Secondary | ICD-10-CM | POA: Diagnosis not present

## 2016-07-31 DIAGNOSIS — N210 Calculus in bladder: Secondary | ICD-10-CM | POA: Diagnosis not present

## 2016-07-31 DIAGNOSIS — Z0000 Encounter for general adult medical examination without abnormal findings: Secondary | ICD-10-CM | POA: Diagnosis not present

## 2016-07-31 DIAGNOSIS — N2889 Other specified disorders of kidney and ureter: Secondary | ICD-10-CM | POA: Diagnosis not present

## 2016-08-05 DIAGNOSIS — N2889 Other specified disorders of kidney and ureter: Secondary | ICD-10-CM | POA: Diagnosis not present

## 2016-08-07 DIAGNOSIS — G4733 Obstructive sleep apnea (adult) (pediatric): Secondary | ICD-10-CM | POA: Diagnosis not present

## 2016-08-10 DIAGNOSIS — R310 Gross hematuria: Secondary | ICD-10-CM | POA: Diagnosis not present

## 2016-08-10 DIAGNOSIS — D3000 Benign neoplasm of unspecified kidney: Secondary | ICD-10-CM | POA: Diagnosis not present

## 2016-08-24 DIAGNOSIS — N210 Calculus in bladder: Secondary | ICD-10-CM | POA: Diagnosis not present

## 2016-08-24 DIAGNOSIS — N281 Cyst of kidney, acquired: Secondary | ICD-10-CM | POA: Diagnosis not present

## 2016-08-24 DIAGNOSIS — N2889 Other specified disorders of kidney and ureter: Secondary | ICD-10-CM | POA: Diagnosis not present

## 2016-09-06 DIAGNOSIS — G4733 Obstructive sleep apnea (adult) (pediatric): Secondary | ICD-10-CM | POA: Diagnosis not present

## 2016-09-10 DIAGNOSIS — D3001 Benign neoplasm of right kidney: Secondary | ICD-10-CM | POA: Diagnosis not present

## 2016-09-21 DIAGNOSIS — L0390 Cellulitis, unspecified: Secondary | ICD-10-CM | POA: Diagnosis not present

## 2016-09-28 DIAGNOSIS — L209 Atopic dermatitis, unspecified: Secondary | ICD-10-CM | POA: Diagnosis not present

## 2016-09-28 DIAGNOSIS — Z6841 Body Mass Index (BMI) 40.0 and over, adult: Secondary | ICD-10-CM | POA: Diagnosis not present

## 2016-09-28 DIAGNOSIS — L270 Generalized skin eruption due to drugs and medicaments taken internally: Secondary | ICD-10-CM | POA: Diagnosis not present

## 2016-10-07 DIAGNOSIS — G4733 Obstructive sleep apnea (adult) (pediatric): Secondary | ICD-10-CM | POA: Diagnosis not present

## 2016-10-09 DIAGNOSIS — H2513 Age-related nuclear cataract, bilateral: Secondary | ICD-10-CM | POA: Diagnosis not present

## 2016-10-30 ENCOUNTER — Telehealth: Payer: Self-pay | Admitting: Internal Medicine

## 2016-10-30 NOTE — Telephone Encounter (Signed)
Patient stated that he has been taking Metoprolol 50 mg BID since December. Patient has not been taking Metoprolol 12.5 mg am & 25 mg in evening as directed since December when changes were made. Patient is concerned because he states his BP has been up this year from 148/90 to as high as 184/98 at varies doctor visits. (PCP, urgent care, heartcare, etc.) Patient thinks his BP is going to continue to be high with a decrease in Metoprolol. Patient had almost a 6 month supply of the 50 mg dose, since the beginning of the year and now he has run out. Patient has an appointment with Dr. Lovena Le at the end of August. Will forward message to see what does Dr. Lovena Le advises for now until patient sees him in a month.

## 2016-10-30 NOTE — Telephone Encounter (Signed)
New message    Pt is calling asking for a call back. He did not say what his question was he was calling about.

## 2016-11-01 NOTE — Telephone Encounter (Signed)
Start coreg 12.5 mg bid and stop metoprolol. GT

## 2016-11-02 NOTE — Telephone Encounter (Signed)
Left a message for the pt to call back to endorse recommendations per Dr Lovena Le.

## 2016-11-04 DIAGNOSIS — L209 Atopic dermatitis, unspecified: Secondary | ICD-10-CM | POA: Diagnosis not present

## 2016-11-04 DIAGNOSIS — I480 Paroxysmal atrial fibrillation: Secondary | ICD-10-CM | POA: Diagnosis not present

## 2016-11-04 DIAGNOSIS — I10 Essential (primary) hypertension: Secondary | ICD-10-CM | POA: Diagnosis not present

## 2016-11-04 DIAGNOSIS — Z6841 Body Mass Index (BMI) 40.0 and over, adult: Secondary | ICD-10-CM | POA: Diagnosis not present

## 2016-11-05 MED ORDER — CARVEDILOL 12.5 MG PO TABS: 12.5000 mg | ORAL_TABLET | Freq: Two times a day (BID) | ORAL | 11 refills | Status: DC

## 2016-11-05 NOTE — Telephone Encounter (Signed)
Patient made aware of Dr. Tanna Furry recommendations to stop metoprolol and to start carvedilol 12.5 mg BID. Patient verbalized understanding and thanked me for the call. Rx sent to patient's preferred pharmacy.

## 2016-11-06 DIAGNOSIS — G4733 Obstructive sleep apnea (adult) (pediatric): Secondary | ICD-10-CM | POA: Diagnosis not present

## 2016-11-23 ENCOUNTER — Encounter: Payer: Self-pay | Admitting: Internal Medicine

## 2016-12-07 DIAGNOSIS — G4733 Obstructive sleep apnea (adult) (pediatric): Secondary | ICD-10-CM | POA: Diagnosis not present

## 2016-12-08 ENCOUNTER — Ambulatory Visit (INDEPENDENT_AMBULATORY_CARE_PROVIDER_SITE_OTHER): Payer: Medicare HMO | Admitting: Internal Medicine

## 2016-12-08 ENCOUNTER — Encounter: Payer: Self-pay | Admitting: Internal Medicine

## 2016-12-08 VITALS — BP 152/98 | HR 74 | Ht 71.0 in | Wt 286.8 lb

## 2016-12-08 DIAGNOSIS — I471 Supraventricular tachycardia: Secondary | ICD-10-CM | POA: Diagnosis not present

## 2016-12-08 MED ORDER — ASPIRIN EC 81 MG PO TBEC: 81.0000 mg | DELAYED_RELEASE_TABLET | Freq: Every day | ORAL | 3 refills | Status: DC

## 2016-12-08 NOTE — Progress Notes (Signed)
HPI Mr. Bodi returns today for followup. He is a pleasant 76 year old man with SVT secondary to antidromic AV reentrant tachycardia. During his ablation, we could make his accessory pathway go away, but then it would come back. He was placed on propafenone therapy and has done well in the interim with no recurrent SVT. The patient was recently diagnosed with hematuria and has a spot on his kidney which is being observed and for which he will have a repeat CT scan in a few weeks. He has not had syncope. He does not have any heart racing problems at the present time. No peripheral edema. He admits to being very sedentary. At home, his blood pressures a been controlled. He admits to being very sedentary. Allergies  Allergen Reactions  . Eliquis [Apixaban] Other (See Comments)    Bleeding event  . Codeine Other (See Comments)    Cant remember     Current Outpatient Prescriptions  Medication Sig Dispense Refill  . ALPRAZolam (XANAX) 1 MG tablet Take 1 tablet by mouth 2 (two) times daily as needed for anxiety.   0  . carvedilol (COREG) 12.5 MG tablet Take 1 tablet (12.5 mg total) by mouth 2 (two) times daily. 60 tablet 11  . ferrous sulfate 325 (65 FE) MG tablet Take 1 tablet by mouth daily.  3  . Fluticasone-Salmeterol (ADVAIR DISKUS) 250-50 MCG/DOSE AEPB Inhale 1 puff into the lungs every 12 (twelve) hours.      . furosemide (LASIX) 40 MG tablet Take 40 mg by mouth daily.     Marland Kitchen losartan (COZAAR) 100 MG tablet Take 100 mg by mouth daily.     . Methylcobalamin (B-12) 5000 MCG TBDP Take 5,000 Units by mouth daily.    . mupirocin ointment (BACTROBAN) 2 % Apply 1 application topically daily.  1  . omeprazole (PRILOSEC) 20 MG capsule Take 20 mg by mouth daily.   1  . OVER THE COUNTER MEDICATION Inhale 1 application into the lungs at bedtime. CPAP with O2    . propafenone (RYTHMOL) 300 MG tablet Take 1 tablet (300 mg total) by mouth 2 (two) times daily. 180 tablet 3  . rosuvastatin (CRESTOR)  10 MG tablet Take 10 mg by mouth at bedtime.     Marland Kitchen tiotropium (SPIRIVA) 18 MCG inhalation capsule Place 18 mcg into inhaler and inhale daily.      Marland Kitchen triamcinolone cream (KENALOG) 0.5 % Apply 1 application topically daily.  0  . vitamin C (ASCORBIC ACID) 500 MG tablet Take 500 mg by mouth daily.      No current facility-administered medications for this visit.      Past Medical History:  Diagnosis Date  . Anxiety   . COPD (chronic obstructive pulmonary disease) ([place])   . HLD (hyperlipidemia)   . HTN (hypertension)   . Ischemic heart disease   . Obesity   . OSA (obstructive sleep apnea)   . Palpitation   . PVC (premature ventricular contraction)   . SOB (shortness of breath)   . SVT (supraventricular tachycardia) ([place])   . Umbilical hernia   . WPW (Wolff-Parkinson-White syndrome)     ROS:   All systems reviewed and negative except as noted in the HPI.   Past Surgical History:  Procedure Laterality Date  . CARDIAC CATHETERIZATION N/A 03/16/2016   Procedure: Left Heart Cath and Coronary Angiography;  Surgeon: Troy Sine, MD;  Location: [facility];  Service: Cardiovascular;  Laterality: N/A;  . COLONOSCOPY  WITH PROPOFOL N/A 06/11/2014   Procedure: COLONOSCOPY WITH PROPOFOL;  Surgeon: Inda Castle, MD;  Location: [facility];  Service: Endoscopy;  Laterality: N/A;  . ELECTROPHYSIOLOGIC STUDY N/A 03/23/2016   Procedure: Electrophysiology Study;  Surgeon: Evans Lance, MD;  Location: [facility];  Service: Cardiovascular;  Laterality: N/A;  . ELECTROPHYSIOLOGIC STUDY N/A 03/23/2016   Procedure: SVT Ablation;  Surgeon: Evans Lance, MD;  Location: [facility];  Service: Cardiovascular;  Laterality: N/A;  . ESOPHAGOGASTRODUODENOSCOPY (EGD) WITH PROPOFOL N/A 06/11/2014   Procedure: ESOPHAGOGASTRODUODENOSCOPY (EGD) WITH PROPOFOL;  Surgeon: Inda Castle, MD;  Location: [facility];  Service: Endoscopy;  Laterality: N/A;  . HERNIA REPAIR        Family History  Problem Relation Age of Onset  . Liver cancer Unknown   . Heart disease Unknown      Social History   Social History  . Marital status: Divorced    Spouse name: N/A  . Number of children: 2  . Years of education: N/A   Occupational History  . retired  Psychologist, prison and probation services   Social History Main Topics  . Smoking status: Former Smoker    Quit date: 04/13/1996  . Smokeless tobacco: Never Used  . Alcohol use No  . Drug use: No  . Sexual activity: Not Currently   Other Topics Concern  . Not on file   Social History Narrative  . No narrative on file     BP (!) 152/98   Pulse 74   Ht 5\' 11"  (1.803 m)   Wt 286 lb 12.8 oz (130.1 kg)   SpO2 (!) 88%   BMI 40.00 kg/m   Physical Exam:   Obese, chronically ill appearing  elderly man, NAD HEENT: Unremarkable Neck:  8 cm  JVD, no thyromegally Lymphatics:  No adenopathy Back:  No CVA tenderness Lungs:  Clear, except for basilar rales bilaterally.  HEART:  Regular rate rhythm, no murmurs, no rubs, no clicks Abd:  soft,  obese,positive bowel sounds, no organomegally, no rebound, no guarding Ext:  2 plus pulses, no edema, no cyanosis, no clubbing Skin:  No rashes no nodules Neuro:  CN II through XII intact, motor grossly intact  EKG - normal sinus rhythm with left axis deviation.    Assess/Plan:  1. SVT - on Propafenone,, the patient's SVT as been well-controlled. No change in medical therapy. 2. HTN - her blood pressure has been well controlled. He is encouraged to lose weight and maintain a low sodium diet.  3. Obesity - he has not been able to lose weight. I have encouraged him to lose weight.   Mikle Bosworth.D.

## 2016-12-08 NOTE — Patient Instructions (Signed)
Medication Instructions:  Your physician has recommended you make the following change in your medication:  1.  Start taking aspirin 81 mg (one tablet) by mouth daily. 2.  Stop taking metoprolol 3.  Continue taking your carvedilol as ordered.  Labwork: None ordered.   Testing/Procedures: None ordered.   Follow-Up: Your physician wants you to follow-up in: 6 months with Dr. Lovena Le.  You will receive a reminder letter in the mail two months in advance. If you don't receive a letter, please call our office to schedule the follow-up appointment.   Any Other Special Instructions Will Be Listed Below (If Applicable).     If you need a refill on your cardiac medications before your next appointment, please call your pharmacy.

## 2016-12-09 ENCOUNTER — Ambulatory Visit: Payer: Medicare HMO | Admitting: Internal Medicine

## 2016-12-12 DIAGNOSIS — J189 Pneumonia, unspecified organism: Secondary | ICD-10-CM

## 2016-12-12 HISTORY — DX: Pneumonia, unspecified organism: J18.9

## 2016-12-16 ENCOUNTER — Inpatient Hospital Stay (HOSPITAL_COMMUNITY)
Admission: EM | Admit: 2016-12-16 | Discharge: 2017-01-01 | DRG: 871 | Disposition: A | Discharge status: Home Health | Payer: Medicare HMO | Attending: Internal Medicine | Admitting: Internal Medicine

## 2016-12-16 ENCOUNTER — Encounter (HOSPITAL_COMMUNITY): Payer: Self-pay

## 2016-12-16 ENCOUNTER — Emergency Department (HOSPITAL_COMMUNITY): Payer: Medicare HMO

## 2016-12-16 DIAGNOSIS — J440 Chronic obstructive pulmonary disease with acute lower respiratory infection: Secondary | ICD-10-CM | POA: Diagnosis not present

## 2016-12-16 DIAGNOSIS — N183 Chronic kidney disease, stage 3 unspecified: Secondary | ICD-10-CM | POA: Diagnosis present

## 2016-12-16 DIAGNOSIS — E785 Hyperlipidemia, unspecified: Secondary | ICD-10-CM | POA: Diagnosis present

## 2016-12-16 DIAGNOSIS — E662 Morbid (severe) obesity with alveolar hypoventilation: Secondary | ICD-10-CM

## 2016-12-16 DIAGNOSIS — Z9989 Dependence on other enabling machines and devices: Secondary | ICD-10-CM

## 2016-12-16 DIAGNOSIS — I10 Essential (primary) hypertension: Secondary | ICD-10-CM | POA: Diagnosis present

## 2016-12-16 DIAGNOSIS — E860 Dehydration: Secondary | ICD-10-CM | POA: Diagnosis present

## 2016-12-16 DIAGNOSIS — Z888 Allergy status to other drugs, medicaments and biological substances status: Secondary | ICD-10-CM | POA: Diagnosis not present

## 2016-12-16 DIAGNOSIS — J189 Pneumonia, unspecified organism: Secondary | ICD-10-CM | POA: Diagnosis not present

## 2016-12-16 DIAGNOSIS — I5032 Chronic diastolic (congestive) heart failure: Secondary | ICD-10-CM | POA: Diagnosis present

## 2016-12-16 DIAGNOSIS — I7121 Aneurysm of the ascending aorta, without rupture: Secondary | ICD-10-CM

## 2016-12-16 DIAGNOSIS — G4733 Obstructive sleep apnea (adult) (pediatric): Secondary | ICD-10-CM | POA: Diagnosis not present

## 2016-12-16 DIAGNOSIS — I130 Hypertensive heart and chronic kidney disease with heart failure and stage 1 through stage 4 chronic kidney disease, or unspecified chronic kidney disease: Secondary | ICD-10-CM | POA: Diagnosis not present

## 2016-12-16 DIAGNOSIS — R0902 Hypoxemia: Secondary | ICD-10-CM | POA: Diagnosis not present

## 2016-12-16 DIAGNOSIS — E669 Obesity, unspecified: Secondary | ICD-10-CM | POA: Diagnosis present

## 2016-12-16 DIAGNOSIS — A419 Sepsis, unspecified organism: Secondary | ICD-10-CM | POA: Diagnosis not present

## 2016-12-16 DIAGNOSIS — I712 Thoracic aortic aneurysm, without rupture: Secondary | ICD-10-CM | POA: Diagnosis present

## 2016-12-16 DIAGNOSIS — J9690 Respiratory failure, unspecified, unspecified whether with hypoxia or hypercapnia: Secondary | ICD-10-CM

## 2016-12-16 DIAGNOSIS — R0602 Shortness of breath: Secondary | ICD-10-CM | POA: Diagnosis not present

## 2016-12-16 DIAGNOSIS — T502X5A Adverse effect of carbonic-anhydrase inhibitors, benzothiadiazides and other diuretics, initial encounter: Secondary | ICD-10-CM | POA: Diagnosis not present

## 2016-12-16 DIAGNOSIS — E876 Hypokalemia: Secondary | ICD-10-CM | POA: Diagnosis not present

## 2016-12-16 DIAGNOSIS — N179 Acute kidney failure, unspecified: Secondary | ICD-10-CM | POA: Diagnosis not present

## 2016-12-16 DIAGNOSIS — I259 Chronic ischemic heart disease, unspecified: Secondary | ICD-10-CM | POA: Diagnosis present

## 2016-12-16 DIAGNOSIS — R5381 Other malaise: Secondary | ICD-10-CM | POA: Diagnosis not present

## 2016-12-16 DIAGNOSIS — I472 Ventricular tachycardia: Secondary | ICD-10-CM | POA: Diagnosis not present

## 2016-12-16 DIAGNOSIS — Z6836 Body mass index (BMI) 36.0-36.9, adult: Secondary | ICD-10-CM

## 2016-12-16 DIAGNOSIS — R05 Cough: Secondary | ICD-10-CM | POA: Diagnosis not present

## 2016-12-16 DIAGNOSIS — Z9981 Dependence on supplemental oxygen: Secondary | ICD-10-CM

## 2016-12-16 DIAGNOSIS — R000 Tachycardia, unspecified: Secondary | ICD-10-CM | POA: Diagnosis not present

## 2016-12-16 DIAGNOSIS — J9601 Acute respiratory failure with hypoxia: Secondary | ICD-10-CM | POA: Diagnosis not present

## 2016-12-16 DIAGNOSIS — Z87891 Personal history of nicotine dependence: Secondary | ICD-10-CM | POA: Diagnosis not present

## 2016-12-16 DIAGNOSIS — J441 Chronic obstructive pulmonary disease with (acute) exacerbation: Secondary | ICD-10-CM | POA: Diagnosis present

## 2016-12-16 DIAGNOSIS — G4730 Sleep apnea, unspecified: Secondary | ICD-10-CM | POA: Diagnosis not present

## 2016-12-16 DIAGNOSIS — Z885 Allergy status to narcotic agent status: Secondary | ICD-10-CM | POA: Diagnosis not present

## 2016-12-16 DIAGNOSIS — R069 Unspecified abnormalities of breathing: Secondary | ICD-10-CM | POA: Diagnosis not present

## 2016-12-16 DIAGNOSIS — Z7951 Long term (current) use of inhaled steroids: Secondary | ICD-10-CM

## 2016-12-16 DIAGNOSIS — J181 Lobar pneumonia, unspecified organism: Secondary | ICD-10-CM | POA: Diagnosis not present

## 2016-12-16 DIAGNOSIS — Z7982 Long term (current) use of aspirin: Secondary | ICD-10-CM | POA: Diagnosis not present

## 2016-12-16 DIAGNOSIS — J9621 Acute and chronic respiratory failure with hypoxia: Secondary | ICD-10-CM | POA: Diagnosis present

## 2016-12-16 DIAGNOSIS — I456 Pre-excitation syndrome: Secondary | ICD-10-CM | POA: Diagnosis present

## 2016-12-16 DIAGNOSIS — I700 Atherosclerosis of aorta: Secondary | ICD-10-CM | POA: Diagnosis not present

## 2016-12-16 DIAGNOSIS — I4729 Other ventricular tachycardia: Secondary | ICD-10-CM

## 2016-12-16 DIAGNOSIS — F419 Anxiety disorder, unspecified: Secondary | ICD-10-CM | POA: Diagnosis present

## 2016-12-16 HISTORY — DX: Pneumonia, unspecified organism: J18.9

## 2016-12-16 HISTORY — DX: Chronic kidney disease, stage 3 unspecified: N18.30

## 2016-12-16 HISTORY — DX: Chronic kidney disease, stage 3 (moderate): N18.3

## 2016-12-16 HISTORY — DX: Chronic diastolic (congestive) heart failure: I50.32

## 2016-12-16 LAB — I-STAT CHEM 8, ED
BUN: 45 mg/dL — AB (ref 6–20)
Calcium, Ion: 0.98 mmol/L — ABNORMAL LOW (ref 1.15–1.40)
Chloride: 98 mmol/L — ABNORMAL LOW (ref 101–111)
Creatinine, Ser: 1.2 mg/dL (ref 0.61–1.24)
GLUCOSE: 105 mg/dL — AB (ref 65–99)
HCT: 49 % (ref 39.0–52.0)
HEMOGLOBIN: 16.7 g/dL (ref 13.0–17.0)
Potassium: 3.9 mmol/L (ref 3.5–5.1)
Sodium: 137 mmol/L (ref 135–145)
TCO2: 28 mmol/L (ref 22–32)

## 2016-12-16 LAB — COMPREHENSIVE METABOLIC PANEL
ALBUMIN: 2.6 g/dL — AB (ref 3.5–5.0)
ALK PHOS: 131 U/L — AB (ref 38–126)
ALT: 25 U/L (ref 17–63)
AST: 39 U/L (ref 15–41)
Anion gap: 14 (ref 5–15)
BUN: 38 mg/dL — ABNORMAL HIGH (ref 6–20)
CO2: 25 mmol/L (ref 22–32)
CREATININE: 1.29 mg/dL — AB (ref 0.61–1.24)
Calcium: 8.1 mg/dL — ABNORMAL LOW (ref 8.9–10.3)
Chloride: 98 mmol/L — ABNORMAL LOW (ref 101–111)
GFR calc Af Amer: >60 mL/min (ref >60)
GFR calc non Af Amer: 52 mL/min — ABNORMAL LOW (ref >60)
GLUCOSE: 100 mg/dL — AB (ref 65–99)
Potassium: 4.1 mmol/L (ref 3.5–5.1)
SODIUM: 137 mmol/L (ref 135–145)
Total Bilirubin: 0.9 mg/dL (ref 0.3–1.2)
Total Protein: 6.3 g/dL — ABNORMAL LOW (ref 6.5–8.1)

## 2016-12-16 LAB — I-STAT CG4 LACTIC ACID, ED: Lactic Acid, Venous: 2.29 mmol/L (ref 0.5–1.9)

## 2016-12-16 LAB — I-STAT ARTERIAL BLOOD GAS, ED
Acid-Base Excess: 1 mmol/L (ref 0.0–2.0)
BICARBONATE: 27.9 mmol/L (ref 20.0–28.0)
O2 SAT: 87 %
PCO2 ART: 50.9 mmHg — AB (ref 32.0–48.0)
Patient temperature: 98
TCO2: 29 mmol/L (ref 22–32)
pH, Arterial: 7.345 — ABNORMAL LOW (ref 7.350–7.450)
pO2, Arterial: 55 mmHg — ABNORMAL LOW (ref 83.0–108.0)

## 2016-12-16 LAB — CBC WITH DIFFERENTIAL/PLATELET
BASOS ABS: 0.1 10*3/uL (ref 0.0–0.1)
Basophils Relative: 1 %
Eosinophils Absolute: 0 10*3/uL (ref 0.0–0.7)
Eosinophils Relative: 0 %
HCT: 47.9 % (ref 39.0–52.0)
HEMOGLOBIN: 15.2 g/dL (ref 13.0–17.0)
LYMPHS PCT: 10 %
Lymphs Abs: 1.2 10*3/uL (ref 0.7–4.0)
MCH: 29.4 pg (ref 26.0–34.0)
MCHC: 31.7 g/dL (ref 30.0–36.0)
MCV: 92.6 fL (ref 78.0–100.0)
MONOS PCT: 9 %
Monocytes Absolute: 1.1 10*3/uL — ABNORMAL HIGH (ref 0.1–1.0)
NEUTROS ABS: 9.7 10*3/uL — AB (ref 1.7–7.7)
Neutrophils Relative %: 80 %
Platelets: 218 10*3/uL (ref 150–400)
RBC: 5.17 MIL/uL (ref 4.22–5.81)
RDW: 13.9 % (ref 11.5–15.5)
SMEAR REVIEW: ADEQUATE
WBC: 12.1 10*3/uL — AB (ref 4.0–10.5)

## 2016-12-16 MED ORDER — ALBUTEROL SULFATE (2.5 MG/3ML) 0.083% IN NEBU: 2.5000 mg | INHALATION_SOLUTION | RESPIRATORY_TRACT | Status: DC | PRN

## 2016-12-16 MED ORDER — SODIUM CHLORIDE 0.9 % IV SOLN
INTRAVENOUS | Status: DC
  Administered 2016-12-16 – 2016-12-17 (×2): via INTRAVENOUS

## 2016-12-16 MED ORDER — ACETAMINOPHEN 650 MG RE SUPP: 650.0000 mg | Freq: Four times a day (QID) | RECTAL | Status: DC | PRN

## 2016-12-16 MED ORDER — PROPAFENONE HCL 150 MG PO TABS
300.0000 mg | ORAL_TABLET | Freq: Two times a day (BID) | ORAL | Status: DC
  Administered 2016-12-17 – 2017-01-01 (×31): 300 mg via ORAL
  Filled 2016-12-16 (×35): qty 2

## 2016-12-16 MED ORDER — VANCOMYCIN HCL 10 G IV SOLR
2500.0000 mg | Freq: Once | INTRAVENOUS | Status: AC
  Administered 2016-12-16: 2500 mg via INTRAVENOUS
  Filled 2016-12-16: qty 2500

## 2016-12-16 MED ORDER — ASPIRIN EC 81 MG PO TBEC
81.0000 mg | DELAYED_RELEASE_TABLET | Freq: Every day | ORAL | Status: DC
  Administered 2016-12-17 – 2017-01-01 (×16): 81 mg via ORAL
  Filled 2016-12-16 (×16): qty 1

## 2016-12-16 MED ORDER — ROSUVASTATIN CALCIUM 10 MG PO TABS
10.0000 mg | ORAL_TABLET | Freq: Every day | ORAL | Status: DC
  Administered 2016-12-17 – 2016-12-31 (×16): 10 mg via ORAL
  Filled 2016-12-16 (×16): qty 1

## 2016-12-16 MED ORDER — GUAIFENESIN ER 600 MG PO TB12
600.0000 mg | ORAL_TABLET | Freq: Two times a day (BID) | ORAL | Status: DC
  Administered 2016-12-17 (×2): 600 mg via ORAL
  Filled 2016-12-16 (×2): qty 1

## 2016-12-16 MED ORDER — ONDANSETRON HCL 4 MG/2ML IJ SOLN: 4.0000 mg | Freq: Four times a day (QID) | INTRAMUSCULAR | Status: DC | PRN

## 2016-12-16 MED ORDER — PREDNISONE 20 MG PO TABS
40.0000 mg | ORAL_TABLET | Freq: Every day | ORAL | Status: AC
  Administered 2016-12-17 – 2016-12-20 (×4): 40 mg via ORAL
  Filled 2016-12-16 (×4): qty 2

## 2016-12-16 MED ORDER — DEXTROSE 5 % IV SOLN
500.0000 mg | Freq: Once | INTRAVENOUS | Status: AC
  Administered 2016-12-16: 500 mg via INTRAVENOUS
  Filled 2016-12-16: qty 500

## 2016-12-16 MED ORDER — ACETAMINOPHEN 325 MG PO TABS: 650.0000 mg | ORAL_TABLET | Freq: Four times a day (QID) | ORAL | Status: DC | PRN

## 2016-12-16 MED ORDER — ONDANSETRON HCL 4 MG PO TABS: 4.0000 mg | ORAL_TABLET | Freq: Four times a day (QID) | ORAL | Status: DC | PRN

## 2016-12-16 MED ORDER — PIPERACILLIN-TAZOBACTAM 3.375 G IVPB
3.3750 g | Freq: Three times a day (TID) | INTRAVENOUS | Status: DC
Start: 2016-12-17 — End: 2016-12-23
  Administered 2016-12-17 – 2016-12-23 (×20): 3.375 g via INTRAVENOUS
  Filled 2016-12-16 (×21): qty 50

## 2016-12-16 MED ORDER — SODIUM CHLORIDE 0.9 % IV BOLUS (SEPSIS)
500.0000 mL | Freq: Once | INTRAVENOUS | Status: AC
  Administered 2016-12-16: 500 mL via INTRAVENOUS

## 2016-12-16 MED ORDER — METHYLPREDNISOLONE SODIUM SUCC 125 MG IJ SOLR
60.0000 mg | INTRAMUSCULAR | Status: AC
  Administered 2016-12-17: 60 mg via INTRAVENOUS
  Filled 2016-12-16: qty 2

## 2016-12-16 MED ORDER — PIPERACILLIN-TAZOBACTAM 3.375 G IVPB 30 MIN
3.3750 g | Freq: Once | INTRAVENOUS | Status: AC
Start: 2016-12-16 — End: 2016-12-16
  Administered 2016-12-16: 3.375 g via INTRAVENOUS
  Filled 2016-12-16: qty 50

## 2016-12-16 MED ORDER — VANCOMYCIN HCL IN DEXTROSE 1-5 GM/200ML-% IV SOLN
1000.0000 mg | Freq: Two times a day (BID) | INTRAVENOUS | Status: DC
  Administered 2016-12-17 – 2016-12-21 (×9): 1000 mg via INTRAVENOUS
  Filled 2016-12-16 (×10): qty 200

## 2016-12-16 MED ORDER — ENOXAPARIN SODIUM 40 MG/0.4ML ~~LOC~~ SOLN
40.0000 mg | Freq: Every day | SUBCUTANEOUS | Status: DC
  Administered 2016-12-17 – 2016-12-22 (×6): 40 mg via SUBCUTANEOUS
  Filled 2016-12-16 (×5): qty 0.4

## 2016-12-16 MED ORDER — CEFTRIAXONE SODIUM 1 G IJ SOLR
1.0000 g | Freq: Once | INTRAMUSCULAR | Status: AC
  Administered 2016-12-16: 1 g via INTRAVENOUS
  Filled 2016-12-16: qty 10

## 2016-12-16 MED ORDER — ALPRAZOLAM 0.5 MG PO TABS
1.0000 mg | ORAL_TABLET | Freq: Two times a day (BID) | ORAL | Status: DC | PRN
  Administered 2016-12-17 – 2016-12-30 (×17): 1 mg via ORAL
  Filled 2016-12-16 (×17): qty 2

## 2016-12-16 MED ORDER — TETRAHYDROZOLINE HCL 0.05 % OP SOLN
1.0000 [drp] | Freq: Every day | OPHTHALMIC | Status: DC
  Administered 2016-12-20 – 2016-12-30 (×6): 1 [drp] via OPHTHALMIC
  Filled 2016-12-16: qty 15

## 2016-12-16 MED ORDER — PANTOPRAZOLE SODIUM 40 MG PO TBEC
40.0000 mg | DELAYED_RELEASE_TABLET | Freq: Every day | ORAL | Status: DC
  Administered 2016-12-17 – 2017-01-01 (×16): 40 mg via ORAL
  Filled 2016-12-16 (×17): qty 1

## 2016-12-16 MED ORDER — IPRATROPIUM-ALBUTEROL 0.5-2.5 (3) MG/3ML IN SOLN
3.0000 mL | Freq: Four times a day (QID) | RESPIRATORY_TRACT | Status: DC
  Administered 2016-12-17 – 2016-12-22 (×22): 3 mL via RESPIRATORY_TRACT
  Filled 2016-12-16 (×22): qty 3

## 2016-12-16 MED ORDER — MOMETASONE FURO-FORMOTEROL FUM 200-5 MCG/ACT IN AERO
2.0000 | INHALATION_SPRAY | Freq: Two times a day (BID) | RESPIRATORY_TRACT | Status: DC
  Administered 2016-12-17 – 2017-01-01 (×30): 2 via RESPIRATORY_TRACT
  Filled 2016-12-16 (×2): qty 8.8

## 2016-12-16 NOTE — Progress Notes (Signed)
Pharmacy Antibiotic Note  EULAS SCHWEITZER is a 76 y.o. male admitted on 12/16/2016 with sepsis.  Pharmacy has been consulted for Zosyn and vancomycin dosing. Patient has already received a dose of ceftriaxone and azithromycin in the ED. WBC elevated at 12.1. SCr elevated at 1.29 (BL ~ 0.9).   Plan: Vancomycin 2500 mg IV once, then Vancomycin 1000 IV every 12 hours.  Goal trough 15-20 mcg/mL.  Zosyn 3.375 gm IV Q 8 hours Monitor CBC, renal fx, cultures and clinical progress VT at [facility]      No data recorded.   Recent Labs Lab 12/16/16 1619 12/16/16 1631  WBC 12.1*  --   CREATININE 1.29* 1.20  LATICACIDVEN  --  2.29*    Estimated Creatinine Clearance: 70.9 mL/min (by C-G formula based on SCr of 1.2 mg/dL).    Allergies  Allergen Reactions  . Eliquis [Apixaban] Other (See Comments)    Bleeding event  . Codeine Other (See Comments)    Cant remember    Antimicrobials this admission: Vanc 9/5 >>  Zosyn 9/5 >>   Dose adjustments this admission: None   Microbiology results: 9/5 BCx>>  Thank you for allowing pharmacy to be a part of this patient's care.  Albertina Parr, PharmD., BCPS Clinical Pharmacist Pager [phone]

## 2016-12-16 NOTE — H&P (Addendum)
Chad Hogan YIF:[id number] DOB: 04-21-1940 DOA: 12/16/2016     PCP: Myer Peer, MD   Outpatient Specialists: Cardiology Carollee Massed KAplan Patient coming from:   home Lives alone,   Chief Complaint: Shortness of breath cough congestion 1 week  HPI: Chad Hogan is a 76 y.o. 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 He reports sputum productive of green sputum wheezing, no sick contacts, subjective fever and chills, no chest pain Patient required up to 5 L of oxygen currently satting 87% chest x-ray worrisome for pneumonia.  During ER stay patient was noted to have a run of VT. 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      on arrival  ED Triage Vitals  Enc Vitals Group     BP 12/16/16 1545 115/60     Pulse Rate 12/16/16 1545 87     Resp 12/16/16 1545 (!) 34     Temp --      Temp src --      SpO2 12/16/16 1515 92 %     Weight --      Height --      Head Circumference --      Peak Flow --      Pain Score 12/16/16 1517 0     Pain Loc --      Pain Edu? --      Excl. in [place]? --     Latest RR 27 satting 87% glucose BP 112/63 now 92/60  Na 137 K 3.9 BUN 45 Cr 1.2  LActic acid 2.29 WBC 12.1 Hg 15.2  ALB 2.6 CXR - chronic lung disease with new left mid and lower lung opacity likely multifocal pneumonia Following Medications were ordered in ER: Medications  0.9 %  sodium chloride infusion (not administered)  azithromycin (ZITHROMAX) 500 mg in  dextrose 5 % 250 mL IVPB (500 mg Intravenous New Bag/Given 12/16/16 1808)  sodium chloride 0.9 % bolus 500 mL (500 mLs Intravenous New Bag/Given 12/16/16 1727)  cefTRIAXone (ROCEPHIN) 1 g in dextrose 5 % 50 mL IVPB (0 g Intravenous Stopped 12/16/16 1809)     Hospitalist was called for admission for Acute respiratory failure with hypoxia and sepsis secondary to CAP the setting of chronic pulmonary disease, wheezing.  Review of Systems:    Pertinent positives include: Fevers, chills, fatigue,  shortness of breath at rest. dyspnea on exertion productive cough, change in color of mucus. Diarrhea,  Constitutional:  No weight loss, night sweats, weight loss  HEENT:  No headaches, Difficulty swallowing,Tooth/dental problems,Sore throat,  No sneezing, itching, ear ache, nasal congestion, post nasal drip,  Cardio-vascular:  No chest pain, Orthopnea, PND, anasarca, dizziness, palpitations.no Bilateral lower extremity swelling  GI:  No heartburn, indigestion, abdominal pain, nausea, vomiting,   change in bowel habits, loss of appetite, melena, blood in stool, hematemesis Resp:   No excess mucus, no No non-productive cough, No coughing up of blood.  Skin:  no rash or lesions. No jaundice  GU:  no dysuria, change in color of urine, no urgency or frequency. No straining to urinate.  No flank pain.  Musculoskeletal:  No joint pain or no joint swelling. No decreased range of motion. No back pain.  Psych:  No change in mood or affect. No depression or anxiety. No memory loss.  Neuro: no localizing neurological complaints, no tingling, no weakness, no double vision, no gait abnormality, no slurred speech, no confusion  As per HPI otherwise 10 point review of systems negative.   Past Medical History: Past Medical History:  Diagnosis Date  . Anxiety   . COPD (chronic obstructive pulmonary disease) ([place])   . HLD (hyperlipidemia)   . HTN (hypertension)   . Ischemic heart disease   . Obesity   . OSA  (obstructive sleep apnea)   . Palpitation   . PVC (premature ventricular contraction)   . SOB (shortness of breath)   . SVT (supraventricular tachycardia) ([place])   . Umbilical hernia   . WPW (Wolff-Parkinson-White syndrome)    Past Surgical History:  Procedure Laterality Date  . CARDIAC CATHETERIZATION N/A 03/16/2016   Procedure: Left Heart Cath and Coronary Angiography;  Surgeon: Troy Sine, MD;  Location: [facility];  Service: Cardiovascular;  Laterality: N/A;  . COLONOSCOPY WITH PROPOFOL N/A 06/11/2014   Procedure: COLONOSCOPY WITH PROPOFOL;  Surgeon: Inda Castle, MD;  [facility];  Service: Endoscopy;  Laterality: N/A;  . ELECTROPHYSIOLOGIC STUDY N/A 03/23/2016   Procedure: Electrophysiology Study;  Surgeon: Evans Lance, MD;  Location: [facility];  Service: Cardiovascular;  Laterality: N/A;  . ELECTROPHYSIOLOGIC STUDY N/A 03/23/2016   Procedure: SVT Ablation;  Surgeon: Evans Lance, MD;  Location: [facility];  Service: Cardiovascular;  Laterality: N/A;  . ESOPHAGOGASTRODUODENOSCOPY (EGD) WITH PROPOFOL N/A 06/11/2014   Procedure: ESOPHAGOGASTRODUODENOSCOPY (EGD) WITH PROPOFOL;  Surgeon: Inda Castle, MD;  [facility];  Service: Endoscopy;  Laterality: N/A;  . HERNIA REPAIR       Social History:  Ambulatory   independently      reports that he quit smoking about 20 years ago. He has never used smokeless tobacco. He reports that he does not drink alcohol or use drugs.  Allergies:   Allergies  Allergen Reactions  . Eliquis [Apixaban] Other (See Comments)    Bleeding event  . Codeine Other (See Comments)    Cant remember       Family History:   Family History  Problem Relation Age of Onset  . Liver cancer Unknown   . Heart disease Unknown     Medications: Prior to Admission medications   Medication Sig Start Date End Date Taking? Authorizing Provider  ALPRAZolam Duanne Moron) 1 MG tablet Take 1 tablet by mouth 2  (two) times daily as needed for anxiety.  05/15/16  Yes [provider]  aspirin EC 81 MG tablet Take 1 tablet (81 mg total) by mouth daily. Do not fill 12/08/16  Yes Evans Lance, MD  carvedilol (COREG) 12.5 MG tablet Take 1 tablet (12.5 mg total) by mouth 2 (two) times daily. 11/05/16 02/03/17 Yes Evans Lance, MD  ferrous sulfate 325 (65 FE) MG tablet Take 1 tablet by mouth daily. 08/20/15  Yes [provider]  Fluticasone-Salmeterol (ADVAIR DISKUS) 250-50 MCG/DOSE AEPB Inhale 1 puff into the lungs every 12 (twelve) hours.     Yes [provider]  furosemide (LASIX) 40 MG tablet Take 40 mg by mouth daily.  Yes [provider]  losartan (COZAAR) 100 MG tablet Take 100 mg by mouth daily.  12/10/11  Yes [provider]  Methylcobalamin (B-12) 5000 MCG TBDP Take 5,000 Units by mouth daily.   Yes [provider]  omeprazole (PRILOSEC) 20 MG capsule Take 20 mg by mouth daily.  04/10/14  Yes [provider]  OVER THE COUNTER MEDICATION Inhale 1 application into the lungs at bedtime. CPAP with O2   Yes [provider]  propafenone (RYTHMOL) 300 MG tablet Take 1 tablet (300 mg total) by mouth 2 (two) times daily. 06/25/16  Yes Evans Lance, MD  rosuvastatin (CRESTOR) 10 MG tablet Take 10 mg by mouth at bedtime.    Yes [provider]  Tetrahydrozoline HCl (REDNESS RELIEVER EYE DROPS OP) Place 1 drop into both eyes daily. 11/10/16  Yes [provider]  tiotropium (SPIRIVA) 18 MCG inhalation capsule Place 18 mcg into inhaler and inhale daily.     Yes [provider]  vitamin C (ASCORBIC ACID) 500 MG tablet Take 500 mg by mouth daily.    Yes [provider]    Physical Exam: Patient Vitals for the past 24 hrs:  BP Pulse Resp SpO2  12/16/16 1745 (!) 125/54 89 (!) 22 91 %  12/16/16 1700 123/64 85 - (!) 87 %  12/16/16 1600 121/62 84 (!) 25 (!) 87 %  12/16/16 1545 115/60 87 (!) 34 (!) 88 %    12/16/16 1515 - - - 92 %    1. General:  in No Acute distress   Chronically ill -appearing 2. Psychological: Alert and  Oriented 3. Head/ENT:    Dry Mucous Membranes                          Head Non traumatic, neck supple                            Poor Dentition 4. SKIN:  decreased Skin turgor,  Skin clean Dry and intact no rash 5. Heart: Regular rate and rhythm no  Murmur, no Rub or gallop 6. Lungs: some wheezes bilateral crackles   7. Abdomen: Soft, non-tender, Non distended  obese  bowel sounds present 8. Lower extremities: no clubbing, cyanosis, or edema 9. Neurologically Grossly intact, moving all 4 extremities equally  10. MSK: Normal range of motion   body mass index is unknown because there is no height or weight on file.  Labs on Admission:   Labs on Admission: I have personally reviewed following labs and imaging studies  CBC:  Recent Labs Lab 12/16/16 1619 12/16/16 1631  WBC 12.1*  --   NEUTROABS 9.7*  --   HGB 15.2 16.7  HCT 47.9 49.0  MCV 92.6  --   PLT 218  --    Basic Metabolic Panel:  Recent Labs Lab 12/16/16 1619 12/16/16 1631  NA 137 137  K 4.1 3.9  CL 98* 98*  CO2 25  --   GLUCOSE 100* 105*  BUN 38* 45*  CREATININE 1.29* 1.20  CALCIUM 8.1*  --    GFR: Estimated Creatinine Clearance: 70.9 mL/min (by C-G formula based on SCr of 1.2 mg/dL). Liver Function Tests:  Recent Labs Lab 12/16/16 1619  AST 39  ALT 25  ALKPHOS 131*  BILITOT 0.9  PROT 6.3*  ALBUMIN 2.6*   No results for input(s): LIPASE, AMYLASE in the last 168 hours. No  results for input(s): AMMONIA in the last 168 hours. Coagulation Profile: No results for input(s): INR, PROTIME in the last 168 hours. Cardiac Enzymes: No results for input(s): CKTOTAL, CKMB, CKMBINDEX, TROPONINI in the last 168 hours. BNP (last 3 results) No results for input(s): PROBNP in the last 8760 hours. HbA1C: No results for input(s): HGBA1C in the last 72 hours. CBG: No results for  input(s): GLUCAP in the last 168 hours. Lipid Profile: No results for input(s): CHOL, HDL, LDLCALC, TRIG, CHOLHDL, LDLDIRECT in the last 72 hours. Thyroid Function Tests: No results for input(s): TSH, T4TOTAL, FREET4, T3FREE, THYROIDAB in the last 72 hours. Anemia Panel: No results for input(s): VITAMINB12, FOLATE, FERRITIN, TIBC, IRON, RETICCTPCT in the last 72 hours. Urine analysis:  Sepsis Labs: @LABRCNTIP (procalcitonin:4,lacticidven:4) )No results found for this or any previous visit (from the past 240 hour(s)).     UA not ordered  No results found for: HGBA1C  Estimated Creatinine Clearance: 70.9 mL/min (by C-G formula based on SCr of 1.2 mg/dL).  BNP (last 3 results) No results for input(s): PROBNP in the last 8760 hours.   ECG REPORT  Independently reviewed Rate: 93  Rhythm: NSR ST&T Change: Nonspecific changes similar to prior QTC 443  There were no vitals filed for this visit.   Cultures: No results found for: SDES, SPECREQUEST, CULT, REPTSTATUS   Radiological Exams on Admission: Dg Chest 1 View  Result Date: 12/16/2016 CLINICAL DATA:  76 year old male with shortness of breath productive cough and chest congestion for 1 week. Former smoker. EXAM: CHEST 1 VIEW COMPARISON:  Chest CTA 03/13/2016 and earlier. FINDINGS: Portable AP semi upright view at 1541 hours. Stable lung volumes. Stable mild cardiomegaly. Chronic increased interstitial markings, including chronic patchy and reticular opacity at the right lung base. However, there is new chronic left mid and lower lung reticulonodular opacity compared to 2017. No definite pleural effusion. IMPRESSION: Chronic lung disease with superimposed new left mid and lower lung reticulonodular opacity suspicious for multifocal acute infectious exacerbation. Electronically Signed   By: Genevie Ann M.D.   On: 12/16/2016 16:00    Chart has been reviewed    Assessment/Plan  76 y.o. male with medical history significant of SVT  secondary to antidromic AV reentrant tachycardia, NSVT, COPD, HLD, HTN OSA on CPAP, Wolff-Parkinson-White syndrome, hx of PE    Admitted for Acute respiratory failure with hypoxia and sepsis secondary to CAP the setting of chronic pulmonary disease   Present on Admission:    COPD exacerbation -  -  - Will initiate: Steroid taper  -  Antibiotics Vanco and Zosyn - Albuterol  PRN, - scheduled duoneb,  -  Breo or Dulera at discharge   -  Mucinex.  Titrate O2 to saturation >90%. Follow patients respiratory status.    Currently mentating well no evidence of symptomatic hypercarbia  . CAP (community acquired pneumonia) - the setting of pulmonary disease will add broad-spectrum antibiotics await results of blood cultures and sputum cultures check for Legionella and strep pneumo as well as respiratory pattern . Dehydration - soft blood pressures initially currently improving avoid aggressive over rehydration . CKD (chronic kidney disease), stage III - continue to monitor holding need for a toxic medications . Acute on chronic respiratory failure with hypoxia (HCC) admit to step down most likely secondary to pneumonia if no significant improvement despite aggressive management would benefit from [facility] consult currently showing some signs of movement but not back to baseline . Sepsis ([place]) - likely secondary to pneumonia continue to  cycle  lactic acid . Hypocalcemia -  mild replace . Chronic diastolic heart failure (HCC) currently appears to be somewhat on the dry side hold Lasix for tonight keep gentle fluids monitor for fluid overload . Sleep apnea - continue CPap  . WOLFF (WOLFE)-PARKINSON-WHITE (WPW) SYNDROME - currently normal sinus rhythm continue to monitor  . Essential hypertension -  hold home medications given some soft blood pressures in the   NSVT currently resolved will cycle cardiac enzymes no chest pain symptomatic when able restart home medications Other plan as per orders.  DVT  prophylaxis:    Lovenox     Code Status:  FULL CODE  as per patient    Family Communication:   Family not at  Bedside    Disposition Plan:       To home once workup is complete and patient is stable    Would benefit from PT/OT eval prior to DC  ordered                                                Consults called: None  Admission status:    inpatient      Level of care        SDU      I have spent a total of 57 min on this admission  Marquee Fuchs 12/17/2016, 12:06 AM    [facility]  Pager [phone]   after 2 AM please page floor coverage PA If 7AM-7PM, please contact the day team taking care of the patient  [email or web address]  Password [email or web address]

## 2016-12-16 NOTE — ED Provider Notes (Signed)
[facility] DEPT Provider Note   CSN: [id number] Arrival date & time: 12/16/16  1509     History   Chief Complaint Chief Complaint  Patient presents with  . Shortness of Breath    HPI Chad Hogan is a 76 y.o. male.  Patient with a one-week history of fever chills cough productive cough shortness of breath patient feels like he may have pneumonia. Patient just not getting better. He is getting weaker. Patient has a history of COPD. Eyes primary care doctors in the [place] area.      Past Medical History:  Diagnosis Date  . Anxiety   . COPD (chronic obstructive pulmonary disease) ([place])   . HLD (hyperlipidemia)   . HTN (hypertension)   . Ischemic heart disease   . Obesity   . OSA (obstructive sleep apnea)   . Palpitation   . PVC (premature ventricular contraction)   . SOB (shortness of breath)   . SVT (supraventricular tachycardia) ([place])   . Umbilical hernia   . WPW (Wolff-Parkinson-White syndrome)     Patient Active Problem List   Diagnosis Date Noted  . SVT (supraventricular tachycardia) ([place]) 03/23/2016  . WPW syndrome 03/23/2016  . Bradycardia 03/11/2016  . Ventricular tachycardia ([place]) 03/11/2016  . Benign neoplasm of colon 06/11/2014  . Diverticulosis of colon without hemorrhage 06/11/2014  . Internal and external hemorrhoids without complication 17/51/0258  . Duodenal mass 06/11/2014  . History of iron deficiency anemia 05/02/2014  . Acute pulmonary embolism ([place]) 05/02/2014  . PALPITATIONS 02/11/2010  . ATYPICAL DEPRESSIVE DISORDER 08/21/2008  . PULMONARY FIBROSIS 09/15/2007  . Chronic diastolic heart failure ([place]) 09/15/2007  . HYPERLIPIDEMIA 08/16/2007  . SLEEP APNEA 08/16/2007  . Morbid obesity ([place]) 08/15/2007  . ANXIETY 08/15/2007  . HYPERTENSION 08/15/2007  . ISCHEMIC HEART DISEASE 08/15/2007  . WOLFF (WOLFE)-PARKINSON-WHITE (WPW) SYNDROME 08/15/2007  . PREMATURE VENTRICULAR CONTRACTIONS 08/15/2007  . Chronic airway obstruction, not  elsewhere classified 08/15/2007    Past Surgical History:  Procedure Laterality Date  . CARDIAC CATHETERIZATION N/A 03/16/2016   Procedure: Left Heart Cath and Coronary Angiography;  Surgeon: Troy Sine, MD;  Location: [facility];  Service: Cardiovascular;  Laterality: N/A;  . COLONOSCOPY WITH PROPOFOL N/A 06/11/2014   Procedure: COLONOSCOPY WITH PROPOFOL;  Surgeon: Inda Castle, MD;  Location: [facility];  Service: Endoscopy;  Laterality: N/A;  . ELECTROPHYSIOLOGIC STUDY N/A 03/23/2016   Procedure: Electrophysiology Study;  Surgeon: Evans Lance, MD;  Location: [facility];  Service: Cardiovascular;  Laterality: N/A;  . ELECTROPHYSIOLOGIC STUDY N/A 03/23/2016   Procedure: SVT Ablation;  Surgeon: Evans Lance, MD;  Location: [facility];  Service: Cardiovascular;  Laterality: N/A;  . ESOPHAGOGASTRODUODENOSCOPY (EGD) WITH PROPOFOL N/A 06/11/2014   Procedure: ESOPHAGOGASTRODUODENOSCOPY (EGD) WITH PROPOFOL;  Surgeon: Inda Castle, MD;  [facility]: [facility];  Service: Endoscopy;  Laterality: N/A;  . HERNIA REPAIR         Home Medications    Prior to Admission medications   Medication Sig Start Date End Date Taking? Authorizing Provider  ALPRAZolam Duanne Moron) 1 MG tablet Take 1 tablet by mouth 2 (two) times daily as needed for anxiety.  05/15/16   [provider]  aspirin EC 81 MG tablet Take 1 tablet (81 mg total) by mouth daily. Do not fill 12/08/16   Evans Lance, MD  carvedilol (COREG) 12.5 MG tablet Take 1 tablet (12.5 mg total) by mouth 2 (two) times daily. 11/05/16 02/03/17  Evans Lance,  MD  ferrous sulfate 325 (65 FE) MG tablet Take 1 tablet by mouth daily. 08/20/15   [provider]  Fluticasone-Salmeterol (ADVAIR DISKUS) 250-50 MCG/DOSE AEPB Inhale 1 puff into the lungs every 12 (twelve) hours.      [provider]  furosemide (LASIX) 40 MG tablet Take 40 mg by mouth daily.     [provider]  losartan  (COZAAR) 100 MG tablet Take 100 mg by mouth daily.  12/10/11   [provider]  Methylcobalamin (B-12) 5000 MCG TBDP Take 5,000 Units by mouth daily.    [provider]  mupirocin ointment (BACTROBAN) 2 % Apply 1 application topically daily. 09/28/16   [provider]  omeprazole (PRILOSEC) 20 MG capsule Take 20 mg by mouth daily.  04/10/14   [provider]  OVER THE COUNTER MEDICATION Inhale 1 application into the lungs at bedtime. CPAP with O2    [provider]  propafenone (RYTHMOL) 300 MG tablet Take 1 tablet (300 mg total) by mouth 2 (two) times daily. 06/25/16   Evans Lance, MD  rosuvastatin (CRESTOR) 10 MG tablet Take 10 mg by mouth at bedtime.     [provider]  tiotropium (SPIRIVA) 18 MCG inhalation capsule Place 18 mcg into inhaler and inhale daily.      [provider]  triamcinolone cream (KENALOG) 0.5 % Apply 1 application topically daily. 09/28/16   [provider]  vitamin C (ASCORBIC ACID) 500 MG tablet Take 500 mg by mouth daily.     [provider]    Family History Family History  Problem Relation Age of Onset  . Liver cancer Unknown   . Heart disease Unknown     Social History Social History  Substance Use Topics  . Smoking status: Former Smoker    Quit date: 04/13/1996  . Smokeless tobacco: Never Used  . Alcohol use No     Allergies   Eliquis [apixaban] and Codeine   Review of Systems Review of Systems  Constitutional: Positive for chills, fatigue and fever.  HENT: Positive for congestion.   Eyes: Negative for visual disturbance.  Respiratory: Positive for cough and shortness of breath.   Cardiovascular: Negative for chest pain.  Gastrointestinal: Positive for diarrhea. Negative for abdominal pain.  Genitourinary: Negative for dysuria.  Musculoskeletal: Negative for myalgias.  Skin: Negative for rash.  Neurological: Negative for headaches.  Hematological: Does not  bruise/bleed easily.  Psychiatric/Behavioral: Negative for confusion.     Physical Exam Updated Vital Signs BP 123/64   Pulse 85   Resp (!) 25   SpO2 (!) 87%   Physical Exam  Constitutional: He is oriented to person, place, and time. He appears well-developed and well-nourished. He appears distressed.  HENT:  Head: Normocephalic and atraumatic.  Next membranes dry  Eyes: Pupils are equal, round, and reactive to light. Conjunctivae and EOM are normal.  Neck: Normal range of motion. Neck supple.  Cardiovascular: Normal rate, regular rhythm and normal heart sounds.   Pulmonary/Chest: Breath sounds normal. He is in respiratory distress. He has no wheezes.  Abdominal: Soft. Bowel sounds are normal. There is no tenderness.  Musculoskeletal: Normal range of motion. He exhibits edema.  Neurological: He is alert and oriented to person, place, and time. No cranial nerve deficit or sensory deficit. He exhibits normal muscle tone. Coordination normal.  Skin: Skin is warm. No rash noted.  Nursing note and vitals reviewed.    ED Treatments / Results  Labs (all labs ordered  are listed, but only abnormal results are displayed) Labs Reviewed  CBC WITH DIFFERENTIAL/PLATELET - Abnormal; Notable for the following:       Result Value   WBC 12.1 (*)    All other components within normal limits  COMPREHENSIVE METABOLIC PANEL - Abnormal; Notable for the following:    Chloride 98 (*)    Glucose, Bld 100 (*)    BUN 38 (*)    Creatinine, Ser 1.29 (*)    Calcium 8.1 (*)    Total Protein 6.3 (*)    Albumin 2.6 (*)    Alkaline Phosphatase 131 (*)    GFR calc non Af Amer 52 (*)    All other components within normal limits  I-STAT CHEM 8, ED - Abnormal; Notable for the following:    Chloride 98 (*)    BUN 45 (*)    Glucose, Bld 105 (*)    Calcium, Ion 0.98 (*)    All other components within normal limits  I-STAT CG4 LACTIC ACID, ED - Abnormal; Notable for the following:    Lactic Acid, Venous  2.29 (*)    All other components within normal limits  CULTURE, BLOOD (ROUTINE X 2)  CULTURE, BLOOD (ROUTINE X 2)    EKG  EKG Interpretation  Date/Time:  Wednesday December 16 2016 15:20:07 EDT Ventricular Rate:  93 PR Interval:    QRS Duration: 128 QT Interval:  356 QTC Calculation: 443 R Axis:   -60 Text Interpretation:  Sinus rhythm Paired ventricular premature complexes Nonspecific IVCD with LAD Nonspecific T abnormalities, lateral leads ST elevation, consider inferior injury Confirmed by Fredia Sorrow [phone]) on 12/16/2016 3:30:32 PM       Radiology Dg Chest 1 View  Result Date: 12/16/2016 CLINICAL DATA:  76 year old male with shortness of breath productive cough and chest congestion for 1 week. Former smoker. EXAM: CHEST 1 VIEW COMPARISON:  Chest CTA 03/13/2016 and earlier. FINDINGS: Portable AP semi upright view at 1541 hours. Stable lung volumes. Stable mild cardiomegaly. Chronic increased interstitial markings, including chronic patchy and reticular opacity at the right lung base. However, there is new chronic left mid and lower lung reticulonodular opacity compared to 2017. No definite pleural effusion. IMPRESSION: Chronic lung disease with superimposed new left mid and lower lung reticulonodular opacity suspicious for multifocal acute infectious exacerbation. Electronically Signed   By: Genevie Ann M.D.   On: 12/16/2016 16:00    Procedures Procedures (including critical care time)  Medications Ordered in ED Medications  0.9 %  sodium chloride infusion (not administered)  azithromycin (ZITHROMAX) 500 mg in dextrose 5 % 250 mL IVPB (not administered)  sodium chloride 0.9 % bolus 500 mL (500 mLs Intravenous New Bag/Given 12/16/16 1727)  cefTRIAXone (ROCEPHIN) 1 g in dextrose 5 % 50 mL IVPB (1 g Intravenous New Bag/Given 12/16/16 1728)     Initial Impression / Assessment and Plan / ED Course  I have reviewed the triage vital signs and the nursing notes.  Pertinent labs &  imaging results that were available during my care of the patient were reviewed by me and considered in my medical decision making (see chart for details).    Patient with one-week history of shortness of breath productive cough fevers chills and fatigue. Patient is followed by primary care in the [place] area.  Chest x-ray here shows left-sided pneumonia. This will be community-acquired. Patient does have a history of COPD. But no wheezing currently. Patient oxygen saturation saturations have been low 90s on 4 L. Patient  not normally on oxygen.  Patient more comfortable on the oxygen and at rest. CAP antibiotics to include Rocephin and Zithromax ordered as per protocol.   Patient when he first arrived the rhythm did show a period of V. tach. Has not had recurrent V. tach. Patient has had some PVCs.     Final Clinical Impressions(s) / ED Diagnoses   Final diagnoses:  Community acquired pneumonia of left lower lobe of lung ([place])  Ventricular tachycardia, non-sustained ([place])  Hypoxia    New Prescriptions New Prescriptions   No medications on file     Fredia Sorrow, MD 12/16/16 [phone]

## 2016-12-16 NOTE — ED Triage Notes (Signed)
[facility] EMS- pt coming from home complaint of SOB, cough, congstion X1 week. Hx of COPD. Pt had 5mg  of albuterol, 0.5mg  of atrovent, and 125mg  of solumedrol. Pt alert and oriented on arrival. Was initially 53% on room air per FDept, improved to 92% on neb treatment.

## 2016-12-16 NOTE — ED Notes (Signed)
One pair eyeglasses in patient's Belonging's Bag.

## 2016-12-16 NOTE — ED Notes (Signed)
Attempted report 

## 2016-12-16 NOTE — ED Provider Notes (Signed)
[facility] DEPT Provider Note   CSN: [id number] Arrival date & time: 12/16/16  1509     History   Chief Complaint Chief Complaint  Patient presents with  . Shortness of Breath    HPI Chad Hogan is a 76 y.o. male.  Duplicate note.      Past Medical History:  Diagnosis Date  . Anxiety   . COPD (chronic obstructive pulmonary disease) ([place])   . HLD (hyperlipidemia)   . HTN (hypertension)   . Ischemic heart disease   . Obesity   . OSA (obstructive sleep apnea)   . Palpitation   . PVC (premature ventricular contraction)   . SOB (shortness of breath)   . SVT (supraventricular tachycardia) ([place])   . Umbilical hernia   . WPW (Wolff-Parkinson-White syndrome)     Patient Active Problem List   Diagnosis Date Noted  . SVT (supraventricular tachycardia) ([place]) 03/23/2016  . WPW syndrome 03/23/2016  . Bradycardia 03/11/2016  . Ventricular tachycardia ([place]) 03/11/2016  . Benign neoplasm of colon 06/11/2014  . Diverticulosis of colon without hemorrhage 06/11/2014  . Internal and external hemorrhoids without complication 78/46/9629  . Duodenal mass 06/11/2014  . History of iron deficiency anemia 05/02/2014  . Acute pulmonary embolism ([place]) 05/02/2014  . PALPITATIONS 02/11/2010  . ATYPICAL DEPRESSIVE DISORDER 08/21/2008  . PULMONARY FIBROSIS 09/15/2007  . Chronic diastolic heart failure ([place]) 09/15/2007  . HYPERLIPIDEMIA 08/16/2007  . SLEEP APNEA 08/16/2007  . Morbid obesity ([place]) 08/15/2007  . ANXIETY 08/15/2007  . HYPERTENSION 08/15/2007  . ISCHEMIC HEART DISEASE 08/15/2007  . WOLFF (WOLFE)-PARKINSON-WHITE (WPW) SYNDROME 08/15/2007  . PREMATURE VENTRICULAR CONTRACTIONS 08/15/2007  . Chronic airway obstruction, not elsewhere classified 08/15/2007    Past Surgical History:  Procedure Laterality Date  . CARDIAC CATHETERIZATION N/A 03/16/2016   Procedure: Left Heart Cath and Coronary Angiography;  Surgeon: Troy Sine, MD;  Location: [facility];   Service: Cardiovascular;  Laterality: N/A;  . COLONOSCOPY WITH PROPOFOL N/A 06/11/2014   Procedure: COLONOSCOPY WITH PROPOFOL;  Surgeon: Inda Castle, MD;  Location: [facility];  Service: Endoscopy;  Laterality: N/A;  . ELECTROPHYSIOLOGIC STUDY N/A 03/23/2016   Procedure: Electrophysiology Study;  Surgeon: Evans Lance, MD;  Location: [facility];  Service: Cardiovascular;  Laterality: N/A;  . ELECTROPHYSIOLOGIC STUDY N/A 03/23/2016   Procedure: SVT Ablation;  Surgeon: Evans Lance, MD;  Location: [facility];  Service: Cardiovascular;  Laterality: N/A;  . ESOPHAGOGASTRODUODENOSCOPY (EGD) WITH PROPOFOL N/A 06/11/2014   Procedure: ESOPHAGOGASTRODUODENOSCOPY (EGD) WITH PROPOFOL;  Surgeon: Inda Castle, MD;  [facility]: [facility];  Service: Endoscopy;  Laterality: N/A;  . HERNIA REPAIR         Home Medications    Prior to Admission medications   Medication Sig Start Date End Date Taking? Authorizing Provider  ALPRAZolam Duanne Moron) 1 MG tablet Take 1 tablet by mouth 2 (two) times daily as needed for anxiety.  05/15/16   [provider]  aspirin EC 81 MG tablet Take 1 tablet (81 mg total) by mouth daily. Do not fill 12/08/16   Evans Lance, MD  carvedilol (COREG) 12.5 MG tablet Take 1 tablet (12.5 mg total) by mouth 2 (two) times daily. 11/05/16 02/03/17  Evans Lance, MD  ferrous sulfate 325 (65 FE) MG tablet Take 1 tablet by mouth daily. 08/20/15   [provider]  Fluticasone-Salmeterol (ADVAIR DISKUS) 250-50 MCG/DOSE AEPB Inhale 1 puff into the lungs every 12 (twelve) hours.  [provider]  furosemide (LASIX) 40 MG tablet Take 40 mg by mouth daily.     [provider]  losartan (COZAAR) 100 MG tablet Take 100 mg by mouth daily.  12/10/11   [provider]  Methylcobalamin (B-12) 5000 MCG TBDP Take 5,000 Units by mouth daily.    [provider]  mupirocin ointment (BACTROBAN) 2 % Apply 1 application topically  daily. 09/28/16   [provider]  omeprazole (PRILOSEC) 20 MG capsule Take 20 mg by mouth daily.  04/10/14   [provider]  OVER THE COUNTER MEDICATION Inhale 1 application into the lungs at bedtime. CPAP with O2    [provider]  propafenone (RYTHMOL) 300 MG tablet Take 1 tablet (300 mg total) by mouth 2 (two) times daily. 06/25/16   Evans Lance, MD  rosuvastatin (CRESTOR) 10 MG tablet Take 10 mg by mouth at bedtime.     [provider]  tiotropium (SPIRIVA) 18 MCG inhalation capsule Place 18 mcg into inhaler and inhale daily.      [provider]  triamcinolone cream (KENALOG) 0.5 % Apply 1 application topically daily. 09/28/16   [provider]  vitamin C (ASCORBIC ACID) 500 MG tablet Take 500 mg by mouth daily.     [provider]    Family History Family History  Problem Relation Age of Onset  . Liver cancer Unknown   . Heart disease Unknown     Social History Social History  Substance Use Topics  . Smoking status: Former Smoker    Quit date: 04/13/1996  . Smokeless tobacco: Never Used  . Alcohol use No     Allergies   Eliquis [apixaban] and Codeine   Review of Systems Review of Systems   Physical Exam Updated Vital Signs BP 123/64   Pulse 85   Resp (!) 25   SpO2 (!) 87%   Physical Exam   ED Treatments / Results  Labs (all labs ordered are listed, but only abnormal results are displayed) Labs Reviewed  CBC WITH DIFFERENTIAL/PLATELET - Abnormal; Notable for the following:       Result Value   WBC 12.1 (*)    All other components within normal limits  COMPREHENSIVE METABOLIC PANEL - Abnormal; Notable for the following:    Chloride 98 (*)    Glucose, Bld 100 (*)    BUN 38 (*)    Creatinine, Ser 1.29 (*)    Calcium 8.1 (*)    Total Protein 6.3 (*)    Albumin 2.6 (*)    Alkaline Phosphatase 131 (*)    GFR calc non Af Amer 52 (*)    All other components within normal limits  I-STAT CHEM  8, ED - Abnormal; Notable for the following:    Chloride 98 (*)    BUN 45 (*)    Glucose, Bld 105 (*)    Calcium, Ion 0.98 (*)    All other components within normal limits  I-STAT CG4 LACTIC ACID, ED - Abnormal; Notable for the following:    Lactic Acid, Venous 2.29 (*)    All other components within normal limits  CULTURE, BLOOD (ROUTINE X 2)  CULTURE, BLOOD (ROUTINE X 2)    EKG  EKG Interpretation  Date/Time:  Wednesday December 16 2016 15:20:07 EDT Ventricular Rate:  93 PR Interval:    QRS Duration: 128 QT Interval:  356 QTC Calculation: 443 R Axis:   -60 Text Interpretation:  Sinus rhythm Paired ventricular premature complexes  Nonspecific IVCD with LAD Nonspecific T abnormalities, lateral leads ST elevation, consider inferior injury Confirmed by Fredia Sorrow [phone]) on 12/16/2016 3:30:32 PM       Radiology Dg Chest 1 View  Result Date: 12/16/2016 CLINICAL DATA:  76 year old male with shortness of breath productive cough and chest congestion for 1 week. Former smoker. EXAM: CHEST 1 VIEW COMPARISON:  Chest CTA 03/13/2016 and earlier. FINDINGS: Portable AP semi upright view at 1541 hours. Stable lung volumes. Stable mild cardiomegaly. Chronic increased interstitial markings, including chronic patchy and reticular opacity at the right lung base. However, there is new chronic left mid and lower lung reticulonodular opacity compared to 2017. No definite pleural effusion. IMPRESSION: Chronic lung disease with superimposed new left mid and lower lung reticulonodular opacity suspicious for multifocal acute infectious exacerbation. Electronically Signed   By: Genevie Ann M.D.   On: 12/16/2016 16:00    Procedures Procedures (including critical care time)  Medications Ordered in ED Medications  0.9 %  sodium chloride infusion (not administered)  azithromycin (ZITHROMAX) 500 mg in dextrose 5 % 250 mL IVPB (not administered)  sodium chloride 0.9 % bolus 500 mL (500 mLs Intravenous New  Bag/Given 12/16/16 1727)  cefTRIAXone (ROCEPHIN) 1 g in dextrose 5 % 50 mL IVPB (1 g Intravenous New Bag/Given 12/16/16 1728)     Initial Impression / Assessment and Plan / ED Course  I have reviewed the triage vital signs and the nursing notes.  Pertinent labs & imaging results that were available during my care of the patient were reviewed by me and considered in my medical decision making (see chart for details).     Duplicate note.  Final Clinical Impressions(s) / ED Diagnoses   Final diagnoses:  Community acquired pneumonia of left lower lobe of lung ([place])    New Prescriptions New Prescriptions   No medications on file     Fredia Sorrow, MD 12/17/16 1645

## 2016-12-16 NOTE — ED Notes (Signed)
Lactic acid result given to Dr. Rogene Houston

## 2016-12-17 ENCOUNTER — Encounter (HOSPITAL_COMMUNITY): Payer: Self-pay | Admitting: Student

## 2016-12-17 LAB — PHOSPHORUS
Phosphorus: 3.3 mg/dL (ref 2.5–4.6)
Phosphorus: 3.7 mg/dL (ref 2.5–4.6)

## 2016-12-17 LAB — RESPIRATORY PANEL BY PCR
Adenovirus: NOT DETECTED
Bordetella pertussis: NOT DETECTED
CORONAVIRUS NL63-RVPPCR: NOT DETECTED
CORONAVIRUS OC43-RVPPCR: NOT DETECTED
Chlamydophila pneumoniae: NOT DETECTED
Coronavirus 229E: NOT DETECTED
Coronavirus HKU1: NOT DETECTED
Influenza A: NOT DETECTED
Influenza B: NOT DETECTED
MYCOPLASMA PNEUMONIAE-RVPPCR: NOT DETECTED
Metapneumovirus: NOT DETECTED
PARAINFLUENZA VIRUS 1-RVPPCR: NOT DETECTED
Parainfluenza Virus 2: NOT DETECTED
Parainfluenza Virus 3: NOT DETECTED
Parainfluenza Virus 4: NOT DETECTED
Respiratory Syncytial Virus: NOT DETECTED
Rhinovirus / Enterovirus: NOT DETECTED

## 2016-12-17 LAB — LACTIC ACID, PLASMA
LACTIC ACID, VENOUS: 1.7 mmol/L (ref 0.5–1.9)
Lactic Acid, Venous: 2.1 mmol/L (ref 0.5–1.9)

## 2016-12-17 LAB — EXPECTORATED SPUTUM ASSESSMENT W REFEX TO RESP CULTURE: SPECIAL REQUESTS: NORMAL

## 2016-12-17 LAB — CBC
HCT: 46.5 % (ref 39.0–52.0)
Hemoglobin: 14.7 g/dL (ref 13.0–17.0)
MCH: 29.7 pg (ref 26.0–34.0)
MCHC: 31.6 g/dL (ref 30.0–36.0)
MCV: 93.9 fL (ref 78.0–100.0)
PLATELETS: 196 10*3/uL (ref 150–400)
RBC: 4.95 MIL/uL (ref 4.22–5.81)
RDW: 14.1 % (ref 11.5–15.5)
WBC: 13.5 10*3/uL — ABNORMAL HIGH (ref 4.0–10.5)

## 2016-12-17 LAB — APTT: aPTT: 32 seconds (ref 24–36)

## 2016-12-17 LAB — COMPREHENSIVE METABOLIC PANEL
ALK PHOS: 128 U/L — AB (ref 38–126)
ALT: 29 U/L (ref 17–63)
AST: 38 U/L (ref 15–41)
Albumin: 2.5 g/dL — ABNORMAL LOW (ref 3.5–5.0)
Anion gap: 11 (ref 5–15)
BUN: 35 mg/dL — ABNORMAL HIGH (ref 6–20)
CALCIUM: 8 mg/dL — AB (ref 8.9–10.3)
CHLORIDE: 100 mmol/L — AB (ref 101–111)
CO2: 27 mmol/L (ref 22–32)
CREATININE: 1.17 mg/dL (ref 0.61–1.24)
GFR, EST NON AFRICAN AMERICAN: 58 mL/min — AB (ref >60)
Glucose, Bld: 142 mg/dL — ABNORMAL HIGH (ref 65–99)
Potassium: 4.1 mmol/L (ref 3.5–5.1)
SODIUM: 138 mmol/L (ref 135–145)
Total Bilirubin: 0.7 mg/dL (ref 0.3–1.2)
Total Protein: 7.6 g/dL (ref 6.5–8.1)

## 2016-12-17 LAB — EXPECTORATED SPUTUM ASSESSMENT W GRAM STAIN, RFLX TO RESP C

## 2016-12-17 LAB — STREP PNEUMONIAE URINARY ANTIGEN: STREP PNEUMO URINARY ANTIGEN: NEGATIVE

## 2016-12-17 LAB — PROCALCITONIN: PROCALCITONIN: 1.11 ng/mL

## 2016-12-17 LAB — TSH: TSH: 0.48 u[IU]/mL (ref 0.350–4.500)

## 2016-12-17 LAB — MRSA PCR SCREENING: MRSA by PCR: NEGATIVE

## 2016-12-17 LAB — PREALBUMIN

## 2016-12-17 LAB — PROTIME-INR
INR: 1.18
PROTHROMBIN TIME: 14.9 s (ref 11.4–15.2)

## 2016-12-17 LAB — MAGNESIUM
Magnesium: 2.7 mg/dL — ABNORMAL HIGH (ref 1.7–2.4)
Magnesium: 2.9 mg/dL — ABNORMAL HIGH (ref 1.7–2.4)

## 2016-12-17 LAB — TROPONIN I
TROPONIN I: 0.05 ng/mL — AB (ref <0.03)
Troponin I: <0.03 ng/mL (ref <0.03)

## 2016-12-17 LAB — HIV ANTIBODY (ROUTINE TESTING W REFLEX): HIV Screen 4th Generation wRfx: NONREACTIVE

## 2016-12-17 MED ORDER — SODIUM CHLORIDE 0.9 % IV SOLN
1.0000 g | Freq: Once | INTRAVENOUS | Status: AC
  Administered 2016-12-17: 1 g via INTRAVENOUS
  Filled 2016-12-17: qty 10

## 2016-12-17 MED ORDER — METHYLPREDNISOLONE SODIUM SUCC 125 MG IJ SOLR
INTRAMUSCULAR | Status: AC
  Filled 2016-12-17: qty 2

## 2016-12-17 MED ORDER — DM-GUAIFENESIN ER 30-600 MG PO TB12
1.0000 | ORAL_TABLET | Freq: Two times a day (BID) | ORAL | Status: DC
  Administered 2016-12-17 – 2017-01-01 (×28): 1 via ORAL
  Filled 2016-12-17 (×28): qty 1

## 2016-12-17 NOTE — Plan of Care (Cosign Needed)
Problem: Acute Rehab PT Goals(only PT should resolve) Goal: Pt/caregiver will Perform Home Exercise Program In 2 sessions to demonstrate compliance with program to increase cardiovascular endurance.

## 2016-12-17 NOTE — Plan of Care (Cosign Needed)
Problem: Acute Rehab PT Goals(only PT should resolve) Goal: Pt Will Ambulate Patient will amb.150 ft with mod I, in 2 weeks, to demonstrate improved cardiovascular endurance and decreased need for verbal cues to perform pursed lip breathing.

## 2016-12-17 NOTE — Progress Notes (Signed)
Nutrition Brief Note  Received MD consult from the COPD Protocol and for assessment of nutrition status.   Patient reports recent poor intake for the past 5 days due to PNA. He usually has a great appetite and eats very well. He has been trying to lose weight per his cardiologist's recommendation. Minimal weight loss in February, but has since re-gained the weight.   Wt Readings from Last 10 Encounters:  12/17/16 280 lb 4.8 oz (127.1 kg)  12/08/16 286 lb 12.8 oz (130.1 kg)  06/01/16 265 lb 9.6 oz (120.5 kg)  05/20/16 276 lb (125.2 kg)  04/15/16 282 lb 6.4 oz (128.1 kg)  04/01/16 284 lb 12.8 oz (129.2 kg)  03/23/16 281 lb 4.9 oz (127.6 kg)  03/12/16 293 lb 4.8 oz (133 kg)  09/19/15 287 lb 3.2 oz (130.3 kg)  09/11/14 291 lb 9.6 oz (132.3 kg)    Body mass index is 39.09 kg/m. Patient meets criteria for class 2 obesity based on current BMI.   Nutrition focused physical exam completed.  No muscle or subcutaneous fat depletion noticed.  Noted low albumin 2.5. Low albumin is no longer used to diagnose malnutrition and is a poor indicator of nutrition status. Decreased albumin level is reflective of inflammatory process, acute stress response, and levels are highly affected (decreased) by volume overload. [facility] uses the new malnutrition guidelines published by the American Society for Parenteral and Enteral Nutrition (A.S.P.E.N.) and the Academy of Nutrition and Dietetics (AND).   Current diet order is heart healthy, patient is consuming approximately 50% of meals at this time. Labs and medications reviewed.   No nutrition interventions warranted at this time. If nutrition issues arise, please consult RD.   Molli Barrows, RD, LDN, [place] Pager [phone] After Hours Pager [phone]

## 2016-12-17 NOTE — Progress Notes (Signed)
CRITICAL VALUE ALERT  Critical Value: TROPONIN=0.05  Date & Time Notied: 12/17/2016 AT 0907  Provider Notified: DR WOODS  Orders Received/Actions taken: NO NEW ORDERS RECEIVED

## 2016-12-17 NOTE — Plan of Care (Cosign Needed)
Problem: Acute Rehab PT Goals(only PT should resolve) Goal: PT Additional Goal #1 During ambulation, patient will no longer require verbal cuing for pursed lip breathing when O2 sats drop, in 2 weeks, to demonstrate increased safety awareness during activities.

## 2016-12-17 NOTE — Progress Notes (Signed)
PROGRESS NOTE    Chad Hogan  NTI:[id number] DOB: 1941/02/04 DOA: 12/16/2016 PCP: Myer Peer, MD   Brief Narrative:  76 y.o. WM PMHx Anxiety, HTN, Ischemic Heart Disease, SVT secondary to antidromic AV reentrant tachycardia, NSVT, Wolff-Parkinson-White syndrome, COPD, HLD, OSA on CPAP,   Presented with cough and shortness of breath this been going on for past 1 week patient today called  EMS with me Atrovent 125 of Solu-Medrol on EMS report initially satting 53% in room air but improved to 92% nebulizer treatment. He reports sputum productive of green sputum wheezing, no sick contacts, subjective fever and chills, no chest pain Patient required up to 5 L of oxygen currently satting 87% chest x-ray worrisome for pneumonia.   During ER stay patient was noted to have a run of VT. hemodynamically stable currently resolved   Regarding pertinent Chronic problems: Hx of SVT secondary to antidromic AV reentrant tachycardia sp unsuccessful ablation, responded well to propafenone,     Subjective: 9/6  A/O 4, negative CP, negative SOB. Patient states prior to admission positive fevers, chills, productive cough (grayish green).    Assessment & Plan:   Active Problems:   Essential hypertension   WOLFF (WOLFE)-PARKINSON-WHITE (WPW) SYNDROME   Sleep apnea   Chronic diastolic heart failure (HCC)   CAP (community acquired pneumonia)   Dehydration   CKD (chronic kidney disease), stage III   Acute on chronic respiratory failure with hypoxia (HCC)   NSVT (nonsustained ventricular tachycardia) (HCC)   Sepsis (HCC)   Hypocalcemia     COPD Exacerbation/Multifocal Pneumonia/Sepsis CAP   -Complete seven-day course antibiotics -Prednisone 40 mg daily -Mucinex DM BID -Flutter valve -DuoNeb QID -Dulera BID -Titrate O2 to maintain SPO2 89-93%  Acute on chronic respiratory failure with hypoxia -See COPD exacerbation  CKD stage III -  Chronic diastolic CHF (unknown base weight) -Strict  I&O -Daily weight -Hold BP medication: Currently Soft BP  Wolff-Parkinson-White syndrome -Currently in NSR  Essential hypertension -See CHF  Hypocalcemia   OSA -CPAP per respiratory          DVT prophylaxis: Lovenox Code Status: Full Family Communication: None Disposition Plan: TBD   Consultants:  None    Procedures/Significant Events:  None   I have personally reviewed and interpreted all radiology studies and my findings are as above.  VENTILATOR SETTINGS: None   Cultures 9/5 blood NGTD 9/5 MRSA by PCR negative 9/5 sputum pending    Antimicrobials: Anti-infectives    Start     Stop   12/17/16 0900  vancomycin (VANCOCIN) IVPB 1000 mg/200 mL premix         12/17/16 0300  piperacillin-tazobactam (ZOSYN) IVPB 3.375 g         12/16/16 2030  piperacillin-tazobactam (ZOSYN) IVPB 3.375 g     12/16/16 2053   12/16/16 2030  vancomycin (VANCOCIN) 2,500 mg in sodium chloride 0.9 % 500 mL IVPB     12/16/16 2250   12/16/16 1615  cefTRIAXone (ROCEPHIN) 1 g in dextrose 5 % 50 mL IVPB     12/16/16 1809   12/16/16 1615  azithromycin (ZITHROMAX) 500 mg in dextrose 5 % 250 mL IVPB     12/16/16 1923       Devices    LINES / TUBES:      Continuous Infusions: . sodium chloride 75 mL/hr at 12/17/16 0143  . piperacillin-tazobactam (ZOSYN)  IV Stopped (12/17/16 8676)  . vancomycin       Objective: Vitals:   12/16/16 2115 12/16/16  2145 12/16/16 2335 12/17/16 0443  BP: (!) 105/47 (!) 104/55 (!) 146/77 123/64  Pulse: 81 78 73 64  Resp: (!) 30 (!) 24 (!) 24 (!) 29  Temp:   97.6 F (36.4 C) 97.9 F (36.6 C)  TempSrc:   Oral Oral  SpO2: 91% 92% 92% 92%  Weight:   279 lb 9.6 oz (126.8 kg) 280 lb 4.8 oz (127.1 kg)    Intake/Output Summary (Last 24 hours) at 12/17/16 0902 Last data filed at 12/17/16 0300  Gross per 24 hour  Intake             2125 ml  Output              400 ml  Net             1725 ml   Filed Weights   12/16/16 2335 12/17/16  0443  Weight: 279 lb 9.6 oz (126.8 kg) 280 lb 4.8 oz (127.1 kg)    Examination:  General: A/O 4, No acute respiratory distress Neck:  Negative scars, masses, torticollis, lymphadenopathy, JVD Lungs: bibasilar crackles Rt>> Lt negative wheezing  Cardiovascular: Regular rate and rhythm without murmur gallop or rub normal S1 and S2 Abdomen: Morbidly obese, negative abdominal pain, nondistended, positive soft, bowel sounds, no rebound, no ascites, no appreciable mass Extremities: No significant cyanosis, clubbing, or edema bilateral lower extremities Skin: Negative rashes, lesions, ulcers Psychiatric:  Negative depression, negative anxiety, negative fatigue, negative mania  Central nervous system:  Cranial nerves II through XII intact, tongue/uvula midline, all extremities muscle strength 5/5, sensation intact throughout, negative dysarthria, negative expressive aphasia, negative receptive aphasia.  .     Data Reviewed: Care during the described time interval was provided by me .  I have reviewed this patient's available data, including medical history, events of note, physical examination, and all test results as part of my evaluation.   CBC:  Recent Labs Lab 12/16/16 1619 12/16/16 1631 12/17/16 0752  WBC 12.1*  --  13.5*  NEUTROABS 9.7*  --   --   HGB 15.2 16.7 14.7  HCT 47.9 49.0 46.5  MCV 92.6  --  93.9  PLT 218  --  782   Basic Metabolic Panel:  Recent Labs Lab 12/16/16 1619 12/16/16 1631 12/16/16 2356  NA 137 137  --   K 4.1 3.9  --   CL 98* 98*  --   CO2 25  --   --   GLUCOSE 100* 105*  --   BUN 38* 45*  --   CREATININE 1.29* 1.20  --   CALCIUM 8.1*  --   --   MG  --   --  2.7*  PHOS  --   --  3.3   GFR: Estimated Creatinine Clearance: 70 mL/min (by C-G formula based on SCr of 1.2 mg/dL). Liver Function Tests:  Recent Labs Lab 12/16/16 1619  AST 39  ALT 25  ALKPHOS 131*  BILITOT 0.9  PROT 6.3*  ALBUMIN 2.6*   No results for input(s): LIPASE,  AMYLASE in the last 168 hours. No results for input(s): AMMONIA in the last 168 hours. Coagulation Profile:  Recent Labs Lab 12/16/16 2356  INR 1.18   Cardiac Enzymes:  Recent Labs Lab 12/16/16 2356  TROPONINI <0.03   BNP (last 3 results) No results for input(s): PROBNP in the last 8760 hours. HbA1C: No results for input(s): HGBA1C in the last 72 hours. CBG: No results for input(s): GLUCAP in the last 168 hours.  Lipid Profile: No results for input(s): CHOL, HDL, LDLCALC, TRIG, CHOLHDL, LDLDIRECT in the last 72 hours. Thyroid Function Tests:  Recent Labs  12/17/16 0302  TSH 0.480   Anemia Panel: No results for input(s): VITAMINB12, FOLATE, FERRITIN, TIBC, IRON, RETICCTPCT in the last 72 hours. Urine analysis:    Component Value Date/Time   COLORURINE BROWN (A) 07/27/2016 2049   APPEARANCEUR CLOUDY (A) 07/27/2016 2049   LABSPEC 1.023 07/27/2016 2049   PHURINE 5.0 07/27/2016 2049   GLUCOSEU NEGATIVE 07/27/2016 2049   HGBUR MODERATE (A) 07/27/2016 2049   [place] NEGATIVE 07/27/2016 2049   [place] NEGATIVE 07/27/2016 2049   PROTEINUR 100 (A) 07/27/2016 2049   NITRITE NEGATIVE 07/27/2016 2049   LEUKOCYTESUR NEGATIVE 07/27/2016 2049   Sepsis Labs: @LABRCNTIP (procalcitonin:4,lacticidven:4)  ) Recent Results (from the past 240 hour(s))  Culture, blood (Routine X 2) w Reflex to ID Panel     Status: None (Preliminary result)   Collection Time: 12/16/16  4:19 PM  Result Value Ref Range Status   Specimen Description BLOOD LEFT WRIST  Final   Special Requests   Final    BOTTLES DRAWN AEROBIC AND ANAEROBIC Blood Culture adequate volume   Culture NO GROWTH < 24 HOURS  Final   Report Status PENDING  Incomplete  Culture, blood (Routine X 2) w Reflex to ID Panel     Status: None (Preliminary result)   Collection Time: 12/16/16  5:00 PM  Result Value Ref Range Status   Specimen Description BLOOD LEFT HAND  Final   Special Requests   Final    BOTTLES DRAWN AEROBIC  AND ANAEROBIC Blood Culture adequate volume   Culture NO GROWTH < 24 HOURS  Final   Report Status PENDING  Incomplete  MRSA PCR Screening     Status: None   Collection Time: 12/16/16 11:34 PM  Result Value Ref Range Status   MRSA by PCR NEGATIVE NEGATIVE Final    Comment:        The GeneXpert MRSA Assay (FDA approved for NASAL specimens only), is one component of a comprehensive MRSA colonization surveillance program. It is not intended to diagnose MRSA infection nor to guide or monitor treatment for MRSA infections.   Respiratory Panel by PCR     Status: None   Collection Time: 12/16/16 11:34 PM  Result Value Ref Range Status   Adenovirus NOT DETECTED NOT DETECTED Final   Coronavirus 229E NOT DETECTED NOT DETECTED Final   Coronavirus HKU1 NOT DETECTED NOT DETECTED Final   Coronavirus NL63 NOT DETECTED NOT DETECTED Final   Coronavirus OC43 NOT DETECTED NOT DETECTED Final   Metapneumovirus NOT DETECTED NOT DETECTED Final   Rhinovirus / Enterovirus NOT DETECTED NOT DETECTED Final   Influenza A NOT DETECTED NOT DETECTED Final   Influenza B NOT DETECTED NOT DETECTED Final   Parainfluenza Virus 1 NOT DETECTED NOT DETECTED Final   Parainfluenza Virus 2 NOT DETECTED NOT DETECTED Final   Parainfluenza Virus 3 NOT DETECTED NOT DETECTED Final   Parainfluenza Virus 4 NOT DETECTED NOT DETECTED Final   Respiratory Syncytial Virus NOT DETECTED NOT DETECTED Final   Bordetella pertussis NOT DETECTED NOT DETECTED Final   Chlamydophila pneumoniae NOT DETECTED NOT DETECTED Final   Mycoplasma pneumoniae NOT DETECTED NOT DETECTED Final  Culture, sputum-assessment     Status: None   Collection Time: 12/16/16 11:57 PM  Result Value Ref Range Status   Specimen Description Expect. Sput  Final   Special Requests Normal  Final   Sputum evaluation THIS  SPECIMEN IS ACCEPTABLE FOR SPUTUM CULTURE  Final   Report Status 12/17/2016 FINAL  Final  Culture, respiratory (NON-Expectorated)     Status: None  (Preliminary result)   Collection Time: 12/16/16 11:57 PM  Result Value Ref Range Status   Specimen Description Expect. Sput  Final   Special Requests Normal Reflexed from [id number]  Final   Gram Stain   Final    ABUNDANT WBC PRESENT, PREDOMINANTLY PMN FEW SQUAMOUS EPITHELIAL CELLS PRESENT MODERATE GRAM NEGATIVE RODS FEW GRAM POSITIVE COCCI RARE GRAM POSITIVE RODS    Culture PENDING  Incomplete   Report Status PENDING  Incomplete         Radiology Studies: Dg Chest 1 View  Result Date: 12/16/2016 CLINICAL DATA:  76 year old male with shortness of breath productive cough and chest congestion for 1 week. Former smoker. EXAM: CHEST 1 VIEW COMPARISON:  Chest CTA 03/13/2016 and earlier. FINDINGS: Portable AP semi upright view at 1541 hours. Stable lung volumes. Stable mild cardiomegaly. Chronic increased interstitial markings, including chronic patchy and reticular opacity at the right lung base. However, there is new chronic left mid and lower lung reticulonodular opacity compared to 2017. No definite pleural effusion. IMPRESSION: Chronic lung disease with superimposed new left mid and lower lung reticulonodular opacity suspicious for multifocal acute infectious exacerbation. Electronically Signed   By: Genevie Ann M.D.   On: 12/16/2016 16:00        Scheduled Meds: . aspirin EC  81 mg Oral Daily  . enoxaparin (LOVENOX) injection  40 mg Subcutaneous Daily  . guaiFENesin  600 mg Oral BID  . ipratropium-albuterol  3 mL Nebulization QID  . mometasone-formoterol  2 puff Inhalation BID  . pantoprazole  40 mg Oral Daily  . predniSONE  40 mg Oral Q supper  . propafenone  300 mg Oral BID  . rosuvastatin  10 mg Oral QHS  . tetrahydrozoline  1 drop Both Eyes Daily   Continuous Infusions: . sodium chloride 75 mL/hr at 12/17/16 0143  . piperacillin-tazobactam (ZOSYN)  IV Stopped (12/17/16 6286)  . vancomycin       LOS: 1 day    Time spent: 40 minutes    WOODS, Geraldo Docker, MD [facility] Pager [phone]   If 7PM-7AM, please contact night-coverage [email or web address] Password [email or web address] 12/17/2016, 9:02 AM

## 2016-12-17 NOTE — Progress Notes (Signed)
Physical Therapy Evaluation Patient Details Name: Chad Hogan MRN: [id number] DOB: 02-25-1941 Today's Date: 12/17/2016   History of Present Illness  Patient is a 76 yo male presents with complaints of SOB. PMHx of SVT secondary to antidromic AV reentrant tachycardia, NSVT, COPD, HLD, HTN OSA on CPAP, and Wolff-Parkinson-White syndrome. Patient on droplet precautions possibly due to suspicion of pneumonia as evidenced by chest X-ray revealing exacerbation of lung tissue.  Clinical Impression  Patient demonstrates appropriate strength to perform activities throughout session however, patient's biggest limiting factor is decreased O2 readings. Patient reported inability to recognize decrease in O2, due to lack of symptoms that accompanied. Patient's SPO2 readings ranged  82 and 91 on 5 L of O2, with occasional breaks and cues to perform pursed lip breathing.Patient would benefit from continued skilled physical therapy addressing decreased respiratory endurance secondary to a variety of cardiopulmonary dysfunctions. Patient will also benefit from continuous cuing of appropriate strategies to employ during ADL's to both recognize and counteract decreased oxygen intake.     Follow Up Recommendations None   Equipment Recommendations  None recommended by PT    Recommendations for Other Services       Precautions / Restrictions Precautions Precautions: None Restrictions Weight Bearing Restrictions: No      Mobility  Bed Mobility Overal bed mobility: Modified Independent             General bed mobility comments: Patient's head of bed elevated.  Transfers Overall transfer level: Needs assistance Equipment used: None Transfers: Sit to/from Stand Sit to Stand: Supervision         General transfer comment: Patient was able to perform all transfers safely and with no lossess of balance.  Ambulation/Gait Ambulation/Gait assistance: Min guard Ambulation Distance (Feet): 120 Feet    Gait Pattern/deviations: Step-through pattern;Decreased step length - right;Decreased step length - left;Decreased stride length Gait velocity: Patient amb. with slow steady gait partially due to need to maintain proximity to air tank in order not to pull out nasal canula. Gait velocity interpretation: Below normal speed for age/gender    Stairs            Wheelchair Mobility    Modified Rankin (Stroke Patients Only)       Balance Overall balance assessment: Needs assistance   Sitting balance-Leahy Scale: Good     Standing balance support: No upper extremity supported Standing balance-Leahy Scale: Good                               Pertinent Vitals/Pain Pain Assessment: No/denies pain    Home Living Family/patient expects to be discharged to:: Private residence Living Arrangements: Alone     Home Access: Level entry     Home Layout: One level Home Equipment: None      Prior Function Level of Independence: Independent               Hand Dominance        Extremity/Trunk Assessment   Upper Extremity Assessment Upper Extremity Assessment: Defer to OT evaluation    Lower Extremity Assessment Lower Extremity Assessment: Overall WFL for tasks assessed    Cervical / Trunk Assessment Cervical / Trunk Assessment: Other exceptions Cervical / Trunk Exceptions: forward head and thoracic kyphosis.  Communication   Communication: No difficulties  Cognition Arousal/Alertness: Awake/alert Behavior During Therapy: WFL for tasks assessed/performed Overall Cognitive Status: Within Functional Limits for tasks assessed  General Comments: Occasionally when patient was cued to stop walking because O2 sats were on the decline, pt would take an extra second before heeding direction. Patient unaware of need to limit activity with decrease in O2 because patient did not feel any symptoms with O2 drop.       General Comments      Exercises     Assessment/Plan    PT Assessment Patient needs continued PT services  PT Problem List Decreased activity tolerance;Decreased mobility;Decreased safety awareness;Cardiopulmonary status limiting activity;Obesity       PT Treatment Interventions Gait training;Stair training;Functional mobility training;Therapeutic activities;Therapeutic exercise;Patient/family education    PT Goals (Current goals can be found in the Care Plan section)  Acute Rehab PT Goals Patient Stated Goal: Patient wants to go home and return to completing all ADL's indep. PT Goal Formulation: With patient Time For Goal Achievement: 12/31/16 Potential to Achieve Goals: Good    Frequency Min 3X/week   Barriers to discharge   Patient lives alone and does not report experiencing symptoms when O2 sats begin to decline therefore is unable to determine the need to make necessary modifications to activities to ensure patient's safety.Marland Kitchen    Co-evaluation               AM-PAC PT "6 Clicks" Daily Activity  Outcome Measure Difficulty turning over in bed (including adjusting bedclothes, sheets and blankets)?: None Difficulty moving from lying on back to sitting on the side of the bed? : None Difficulty sitting down on and standing up from a chair with arms (e.g., wheelchair, bedside commode, etc,.)?: None Help needed moving to and from a bed to chair (including a wheelchair)?: A Little Help needed walking in hospital room?: A Little Help needed climbing 3-5 steps with a railing? : A Little 6 Click Score: 21    End of Session Equipment Utilized During Treatment: Gait belt;Oxygen Activity Tolerance: Treatment limited secondary to medical complications (Comment);No increased pain;Patient limited by fatigue (decreased O2 stats) Patient left: in bed;with call bell/phone within reach;with bed alarm set;Other (comment) Nurse Communication: Mobility status PT Visit Diagnosis:  Difficulty in walking, not elsewhere classified (R26.2)    Time: 7517-0017 PT Time Calculation (min) (ACUTE ONLY): 26 min   Charges:   PT Evaluation $PT Eval Moderate Complexity: 1 Mod PT Treatments $Gait Training: 8-22 mins   PT G Codes:        Alysiana Ethridge SPT   Lasundra Hascall 12/17/2016, 11:39 AM

## 2016-12-17 NOTE — Plan of Care (Cosign Needed)
Problem: Acute Rehab PT Goals(only PT should resolve) Goal: Pt Will Go Up/Down Stairs Patient will negotiate a 3-5 stairs with mod I in 2 weeks without significant drop in O2 levels, to demonstrate improved cardiovascular endurance, SL balance, and stability in O2 intake.

## 2016-12-18 DIAGNOSIS — J189 Pneumonia, unspecified organism: Secondary | ICD-10-CM

## 2016-12-18 DIAGNOSIS — J441 Chronic obstructive pulmonary disease with (acute) exacerbation: Secondary | ICD-10-CM

## 2016-12-18 DIAGNOSIS — G4733 Obstructive sleep apnea (adult) (pediatric): Secondary | ICD-10-CM

## 2016-12-18 DIAGNOSIS — I456 Pre-excitation syndrome: Secondary | ICD-10-CM

## 2016-12-18 LAB — MAGNESIUM: Magnesium: 2.9 mg/dL — ABNORMAL HIGH (ref 1.7–2.4)

## 2016-12-18 LAB — BASIC METABOLIC PANEL WITH GFR
Anion gap: 6 (ref 5–15)
BUN: 42 mg/dL — ABNORMAL HIGH (ref 6–20)
CO2: 29 mmol/L (ref 22–32)
Calcium: 8.1 mg/dL — ABNORMAL LOW (ref 8.9–10.3)
Chloride: 100 mmol/L — ABNORMAL LOW (ref 101–111)
Creatinine, Ser: 1.16 mg/dL (ref 0.61–1.24)
GFR calc Af Amer: >60 mL/min (ref >60)
GFR calc non Af Amer: 59 mL/min — ABNORMAL LOW (ref >60)
Glucose, Bld: 124 mg/dL — ABNORMAL HIGH (ref 65–99)
Potassium: 3.9 mmol/L (ref 3.5–5.1)
Sodium: 135 mmol/L (ref 135–145)

## 2016-12-18 LAB — CBC
HCT: 43.6 % (ref 39.0–52.0)
Hemoglobin: 13.9 g/dL (ref 13.0–17.0)
MCH: 29.8 pg (ref 26.0–34.0)
MCHC: 31.9 g/dL (ref 30.0–36.0)
MCV: 93.6 fL (ref 78.0–100.0)
Platelets: 197 K/uL (ref 150–400)
RBC: 4.66 MIL/uL (ref 4.22–5.81)
RDW: 14.1 % (ref 11.5–15.5)
WBC: 14.4 K/uL — ABNORMAL HIGH (ref 4.0–10.5)

## 2016-12-18 LAB — LEGIONELLA PNEUMOPHILA SEROGP 1 UR AG: L. PNEUMOPHILA SEROGP 1 UR AG: NEGATIVE

## 2016-12-18 MED ORDER — CARVEDILOL 12.5 MG PO TABS
12.5000 mg | ORAL_TABLET | Freq: Two times a day (BID) | ORAL | Status: DC
  Administered 2016-12-18 – 2017-01-01 (×29): 12.5 mg via ORAL
  Filled 2016-12-18 (×29): qty 1

## 2016-12-18 MED ORDER — FUROSEMIDE 10 MG/ML IJ SOLN
80.0000 mg | Freq: Two times a day (BID) | INTRAMUSCULAR | Status: DC
  Administered 2016-12-18 – 2016-12-20 (×4): 80 mg via INTRAVENOUS
  Filled 2016-12-18 (×4): qty 8

## 2016-12-18 NOTE — Progress Notes (Signed)
Pt has refused CPAP for the night.  RT to monitor and assess as needed.  

## 2016-12-18 NOTE — Progress Notes (Signed)
Physical Therapy Treatment Patient Details Name: Chad Hogan MRN: [id number] DOB: 1940-09-11 Today's Date: 12/18/2016    History of Present Illness Patient is a 76 yo male presenting to the ED (12/16/16) with complaints of shortness of breath.  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 Patient is on droplet precautions possibly due to suspicion of pneumonia as evidenced by chest X-ray revealing exacerbation of lung tissue.    PT Comments    Patient's mobility continues to be limited by decreased O2 sats. On Non-rebreather mask 8 L of O2, patient's O2 sats ranged from 82% to 93%. Patient required multiple seated and standing rest breaks during activity and cues for deep breathing to improve O2 sats. Patient unable to maintain sats on room air with SpO2 dropping to 87%. Patient asymptomatic with low O2 and would benefit from continued education on energy conservation techniques and performing short bouts of activity with increased rest breaks.Will follow to maximize mobility.   Follow Up Recommendations  No PT follow up     Equipment Recommendations  None recommended by PT    Recommendations for Other Services       Precautions / Restrictions Precautions Precautions: None Restrictions Weight Bearing Restrictions: No    Mobility  Bed Mobility Overal bed mobility: Modified Independent             General bed mobility comments: Patient's head of bed elevated and patient used railing to roll over to sidelying.  Transfers Overall transfer level: Needs assistance Equipment used: None Transfers: Sit to/from Stand Sit to Stand: Supervision         General transfer comment: Patient was able to perform all transfers safely and with no lossess of balance.  Ambulation/Gait Ambulation/Gait assistance: Supervision;Min guard (Patient was able to manage IV pole and lines during amb  but continued to need verbal cues to stop and take deep breaths when O2 sats decreased) Ambulation Distance (Feet): 80 Feet (+90) Assistive device:  (Held onto IV pole) Gait Pattern/deviations: Step-through pattern;Decreased step length - right;Decreased step length - left;Decreased stride length Gait velocity: decreased gait speed. Gait velocity interpretation: Below normal speed for age/gender General Gait Details: Patient preferred to use IV pole for added support during amb., patient continued to ambulate with slow gait with occasional rest breaks needed to recover from decreased O2 readings.   Stairs            Wheelchair Mobility    Modified Rankin (Stroke Patients Only)       Balance Overall balance assessment: Needs assistance   Sitting balance-Leahy Scale: Good     Standing balance support: Single extremity supported Standing balance-Leahy Scale: Fair                              Cognition Arousal/Alertness: Awake/alert Behavior During Therapy: WFL for tasks assessed/performed Overall Cognitive Status: Within Functional Limits for tasks assessed                                 General Comments: Patient needed reinforcement on the importance of paying attention to O2 reading despite a lack of symptoms.      Exercises      General Comments        Pertinent Vitals/Pain Pain Assessment: No/denies pain    Home Living  Prior Function            PT Goals (current goals can now be found in the care plan section) Progress towards PT goals: Progressing toward goals    Frequency    Min 3X/week      PT Plan Current plan remains appropriate    Co-evaluation              AM-PAC PT "6 Clicks" Daily Activity  Outcome Measure  Difficulty turning over in bed (including adjusting bedclothes, sheets and blankets)?: None Difficulty moving from lying on back to sitting on the side of the bed?  : None Difficulty sitting down on and standing up from a chair with arms (e.g., wheelchair, bedside commode, etc,.)?: None Help needed moving to and from a bed to chair (including a wheelchair)?: A Little Help needed walking in hospital room?: A Little Help needed climbing 3-5 steps with a railing? : A Little 6 Click Score: 21    End of Session Equipment Utilized During Treatment: Gait belt;Oxygen Activity Tolerance: Treatment limited secondary to medical complications (Comment);No increased pain;Patient limited by fatigue (decreasing O2 sats) Patient left: in bed;with call bell/phone within reach;with bed alarm set;Other (comment) (Patient was seated at EOb with tray in front.) Nurse Communication: Mobility status PT Visit Diagnosis: Difficulty in walking, not elsewhere classified (R26.2)     Time: 1113-1140 PT Time Calculation (min) (ACUTE ONLY): 27 min  Charges:  $Gait Training: 8-22 mins $Therapeutic Exercise: 8-22 mins                    G Codes:       Lilyahna Sirmon SPT   Lyris Hitchman 12/18/2016, 1:03 PM

## 2016-12-18 NOTE — Progress Notes (Signed)
Pharmacy Antibiotic Note  Chad Hogan is a 76 y.o. male admitted on 12/16/2016 with sepsis.  Pharmacy has been consulted for Zosyn and vancomycin dosing (day 3 of antibiotics).  -WBC= 14.4, afeb, SCr= 1.16 -MRSA PCR - negative   Plan:  Vancomycin 1000 IV every 12 hours.  Goal trough 15-20 mcg/mL.  Could consider d/c vancomycin? Zosyn 3.375 gm IV Q 8 hours Monitor CBC, renal fx, cultures and clinical progress   Weight: 281 lb 11.2 oz (127.8 kg)  Temp (24hrs), Avg:98 F (36.7 C), Min:97.8 F (36.6 C), Max:98.3 F (36.8 C)   Recent Labs Lab 12/16/16 1619 12/16/16 1631 12/16/16 2356 12/17/16 0302 12/17/16 0752 12/18/16 0429  WBC 12.1*  --   --   --  13.5* 14.4*  CREATININE 1.29* 1.20  --   --  1.17 1.16  LATICACIDVEN  --  2.29* 2.1* 1.7  --   --     Estimated Creatinine Clearance: 72.6 mL/min (by C-G formula based on SCr of 1.16 mg/dL).    Allergies  Allergen Reactions  . Eliquis [Apixaban] Other (See Comments)    Bleeding event  . Codeine Other (See Comments)    Cant remember    Antimicrobials this admission: Vanc 9/5 >>  Zosyn 9/5 >>   Dose adjustments this admission: None   Microbiology results: 9/5 BCx: NGTD 9/5 Resp panel: neg 9/5 Sputum: rare GPC/GPR, few squamous epithelial cells  9/5 MRSA PCR: negative  Thank you for allowing pharmacy to be a part of this patient's care.  Hildred Laser, Pharm D 12/18/2016 1:39 PM

## 2016-12-18 NOTE — Progress Notes (Signed)
PROGRESS NOTE    Chad Hogan  GEX:[id number] DOB: 07/31/1940 DOA: 12/16/2016 PCP: Myer Peer, MD   Brief Narrative:  76 y.o. WM PMHx Anxiety, HTN, Ischemic Heart Disease, SVT secondary to antidromic AV reentrant tachycardia, NSVT, Wolff-Parkinson-White syndrome, COPD, HLD, OSA on CPAP,   Presented with cough and shortness of breath this been going on for past 1 week patient today called  EMS with me Atrovent 125 of Solu-Medrol on EMS report initially satting 53% in room air but improved to 92% nebulizer treatment. He reports sputum productive of green sputum wheezing, no sick contacts, subjective fever and chills, no chest pain Patient required up to 5 L of oxygen currently satting 87% chest x-ray worrisome for pneumonia.   During ER stay patient was noted to have a run of VT. hemodynamically stable currently resolved   Regarding pertinent Chronic problems: Hx of SVT secondary to antidromic AV reentrant tachycardia sp unsuccessful ablation, responded well to propafenone,     Subjective: 9/7 last 24 hours afebrile A/O 4, negative CP, negative SOB, patient anxious about physical state (thinks he may not make it all the hospital)     Assessment & Plan:   Active Problems:   Essential hypertension   WOLFF (WOLFE)-PARKINSON-WHITE (WPW) SYNDROME   Sleep apnea   Chronic diastolic heart failure (HCC)   CAP (community acquired pneumonia)   Dehydration   CKD (chronic kidney disease), stage III   Acute on chronic respiratory failure with hypoxia (HCC)   NSVT (nonsustained ventricular tachycardia) (HCC)   Sepsis (HCC)   Hypocalcemia     COPD Exacerbation/Multifocal Pneumonia/Sepsis CAP   -Complete seven-day course antibiotics -Prednisone 40 mg daily -Mucinex DM BID -Flutter valve -DuoNeb QID -Dulera BID -Titrate O2 to maintain SPO2 89-93%  Acute on chronic respiratory failure with hypoxia -See COPD exacerbation  Acute renal failure (baseline Cr 1.1)  Lab Results    Component Value Date   CREATININE 1.16 12/18/2016   CREATININE 1.17 12/17/2016   CREATININE 1.20 12/16/2016  -resolved   Chronic diastolic CHF (unknown base weight) -Strict I&OSince admission +2.0 L -Daily weight Filed Weights   12/16/16 2335 12/17/16 0443 12/18/16 0503  Weight: 279 lb 9.6 oz (126.8 kg) 280 lb 4.8 oz (127.1 kg) 281 lb 11.2 oz (127.8 kg)  -BP trending up restart Coreg 12.5 mg  BID -Lasix 80 mg BID  Wolff-Parkinson-White syndrome -Currently in NSR  Essential hypertension -See CHF  Hypocalcemia   OSA -CPAP per respiratory     Goals of care -9/5 PT/OT consulted     DVT prophylaxis: Lovenox Code Status: Full Family Communication: None Disposition Plan: TBD   Consultants:  None    Procedures/Significant Events:  None   I have personally reviewed and interpreted all radiology studies and my findings are as above.  VENTILATOR SETTINGS: None   Cultures 9/5 blood NGTD 9/5 MRSA by PCR negative 9/5 sputum pending    Antimicrobials: Anti-infectives    Start     Stop   12/17/16 0900  vancomycin (VANCOCIN) IVPB 1000 mg/200 mL premix         12/17/16 0300  piperacillin-tazobactam (ZOSYN) IVPB 3.375 g         12/16/16 2030  piperacillin-tazobactam (ZOSYN) IVPB 3.375 g     12/16/16 2053   12/16/16 2030  vancomycin (VANCOCIN) 2,500 mg in sodium chloride 0.9 % 500 mL IVPB     12/16/16 2250   12/16/16 1615  cefTRIAXone (ROCEPHIN) 1 g in dextrose 5 % 50 mL IVPB  12/16/16 1809   12/16/16 1615  azithromycin (ZITHROMAX) 500 mg in dextrose 5 % 250 mL IVPB     12/16/16 1923       Devices    LINES / TUBES:      Continuous Infusions: . sodium chloride 75 mL/hr at 12/17/16 1013  . piperacillin-tazobactam (ZOSYN)  IV Stopped (12/18/16 5625)  . vancomycin Stopped (12/18/16 0941)     Objective: Vitals:   12/18/16 0503 12/18/16 0804 12/18/16 0805 12/18/16 0815  BP: (!) 112/54     Pulse: 61     Resp: 19     Temp: 98.3 F (36.8 C)      TempSrc: Axillary     SpO2: 92% 93% 93% 93%  Weight: 281 lb 11.2 oz (127.8 kg)       Intake/Output Summary (Last 24 hours) at 12/18/16 0928 Last data filed at 12/18/16 6389  Gross per 24 hour  Intake              660 ml  Output              400 ml  Net              260 ml   Filed Weights   12/16/16 2335 12/17/16 0443 12/18/16 0503  Weight: 279 lb 9.6 oz (126.8 kg) 280 lb 4.8 oz (127.1 kg) 281 lb 11.2 oz (127.8 kg)   Physical Exam:  General: A/O 4, No acute respiratory distress Neck:  Negative scars, masses, torticollis, lymphadenopathy, JVD Lungs: Clear to auscultation bilaterally without wheezes or crackles Cardiovascular: Regular rate and rhythm without murmur gallop or rub normal S1 and S2 Abdomen: Morbidly obese, abdominal pain, nondistended, positive soft, bowel sounds, no rebound, no ascites, no appreciable mass Extremities: No significant cyanosis, clubbing. Plus bilateral B edema 2-3+,  Psychiatric:  Negative depression, positive anxiety, negative fatigue, negative mania  Central nervous system:  Cranial nerves II through XII intact, tongue/uvula midline, all extremities muscle strength 5/5, sensation intact throughout, negative dysarthria, negative expressive aphasia, negative receptive aphasia.    .     Data Reviewed: Care during the described time interval was provided by me .  I have reviewed this patient's available data, including medical history, events of note, physical examination, and all test results as part of my evaluation.   CBC:  Recent Labs Lab 12/16/16 1619 12/16/16 1631 12/17/16 0752 12/18/16 0429  WBC 12.1*  --  13.5* 14.4*  NEUTROABS 9.7*  --   --   --   HGB 15.2 16.7 14.7 13.9  HCT 47.9 49.0 46.5 43.6  MCV 92.6  --  93.9 93.6  PLT 218  --  196 373   Basic Metabolic Panel:  Recent Labs Lab 12/16/16 1619 12/16/16 1631 12/16/16 2356 12/17/16 0752 12/18/16 0429  NA 137 137  --  138 135  K 4.1 3.9  --  4.1 3.9  CL 98* 98*  --   100* 100*  CO2 25  --   --  27 29  GLUCOSE 100* 105*  --  142* 124*  BUN 38* 45*  --  35* 42*  CREATININE 1.29* 1.20  --  1.17 1.16  CALCIUM 8.1*  --   --  8.0* 8.1*  MG  --   --  2.7* 2.9* 2.9*  PHOS  --   --  3.3 3.7  --    GFR: Estimated Creatinine Clearance: 72.6 mL/min (by C-G formula based on SCr of 1.16 mg/dL). Liver Function Tests:  Recent Labs Lab 12/16/16 1619 12/17/16 0752  AST 39 38  ALT 25 29  ALKPHOS 131* 128*  BILITOT 0.9 0.7  PROT 6.3* 7.6  ALBUMIN 2.6* 2.5*   No results for input(s): LIPASE, AMYLASE in the last 168 hours. No results for input(s): AMMONIA in the last 168 hours. Coagulation Profile:  Recent Labs Lab 12/16/16 2356  INR 1.18   Cardiac Enzymes:  Recent Labs Lab 12/16/16 2356 12/17/16 0752  TROPONINI <0.03 0.05*   BNP (last 3 results) No results for input(s): PROBNP in the last 8760 hours. HbA1C: No results for input(s): HGBA1C in the last 72 hours. CBG: No results for input(s): GLUCAP in the last 168 hours. Lipid Profile: No results for input(s): CHOL, HDL, LDLCALC, TRIG, CHOLHDL, LDLDIRECT in the last 72 hours. Thyroid Function Tests:  Recent Labs  12/17/16 0302  TSH 0.480   Anemia Panel: No results for input(s): VITAMINB12, FOLATE, FERRITIN, TIBC, IRON, RETICCTPCT in the last 72 hours. Urine analysis:    Component Value Date/Time   COLORURINE BROWN (A) 07/27/2016 2049   APPEARANCEUR CLOUDY (A) 07/27/2016 2049   LABSPEC 1.023 07/27/2016 2049   PHURINE 5.0 07/27/2016 2049   GLUCOSEU NEGATIVE 07/27/2016 2049   HGBUR MODERATE (A) 07/27/2016 2049   [place] NEGATIVE 07/27/2016 2049   [place] 07/27/2016 2049   PROTEINUR 100 (A) 07/27/2016 2049   NITRITE NEGATIVE 07/27/2016 2049   LEUKOCYTESUR NEGATIVE 07/27/2016 2049   Sepsis Labs: @LABRCNTIP (procalcitonin:4,lacticidven:4)  ) Recent Results (from the past 240 hour(s))  Culture, blood (Routine X 2) w Reflex to ID Panel     Status: None (Preliminary  result)   Collection Time: 12/16/16  4:19 PM  Result Value Ref Range Status   Specimen Description BLOOD LEFT WRIST  Final   Special Requests   Final    BOTTLES DRAWN AEROBIC AND ANAEROBIC Blood Culture adequate volume   Culture NO GROWTH < 24 HOURS  Final   Report Status PENDING  Incomplete  Culture, blood (Routine X 2) w Reflex to ID Panel     Status: None (Preliminary result)   Collection Time: 12/16/16  5:00 PM  Result Value Ref Range Status   Specimen Description BLOOD LEFT HAND  Final   Special Requests   Final    BOTTLES DRAWN AEROBIC AND ANAEROBIC Blood Culture adequate volume   Culture NO GROWTH < 24 HOURS  Final   Report Status PENDING  Incomplete  MRSA PCR Screening     Status: None   Collection Time: 12/16/16 11:34 PM  Result Value Ref Range Status   MRSA by PCR NEGATIVE NEGATIVE Final    Comment:        The GeneXpert MRSA Assay (FDA approved for NASAL specimens only), is one component of a comprehensive MRSA colonization surveillance program. It is not intended to diagnose MRSA infection nor to guide or monitor treatment for MRSA infections.   Respiratory Panel by PCR     Status: None   Collection Time: 12/16/16 11:34 PM  Result Value Ref Range Status   Adenovirus NOT DETECTED NOT DETECTED Final   Coronavirus 229E NOT DETECTED NOT DETECTED Final   Coronavirus HKU1 NOT DETECTED NOT DETECTED Final   Coronavirus NL63 NOT DETECTED NOT DETECTED Final   Coronavirus OC43 NOT DETECTED NOT DETECTED Final   Metapneumovirus NOT DETECTED NOT DETECTED Final   Rhinovirus / Enterovirus NOT DETECTED NOT DETECTED Final   Influenza A NOT DETECTED NOT DETECTED Final   Influenza B NOT DETECTED NOT DETECTED Final  Parainfluenza Virus 1 NOT DETECTED NOT DETECTED Final   Parainfluenza Virus 2 NOT DETECTED NOT DETECTED Final   Parainfluenza Virus 3 NOT DETECTED NOT DETECTED Final   Parainfluenza Virus 4 NOT DETECTED NOT DETECTED Final   Respiratory Syncytial Virus NOT DETECTED  NOT DETECTED Final   Bordetella pertussis NOT DETECTED NOT DETECTED Final   Chlamydophila pneumoniae NOT DETECTED NOT DETECTED Final   Mycoplasma pneumoniae NOT DETECTED NOT DETECTED Final  Culture, sputum-assessment     Status: None   Collection Time: 12/16/16 11:57 PM  Result Value Ref Range Status   Specimen Description Expect. Sput  Final   Special Requests Normal  Final   Sputum evaluation THIS SPECIMEN IS ACCEPTABLE FOR SPUTUM CULTURE  Final   Report Status 12/17/2016 FINAL  Final  Culture, respiratory (NON-Expectorated)     Status: None (Preliminary result)   Collection Time: 12/16/16 11:57 PM  Result Value Ref Range Status   Specimen Description Expect. Sput  Final   Special Requests Normal Reflexed from [id number]  Final   Gram Stain   Final    ABUNDANT WBC PRESENT, PREDOMINANTLY PMN FEW SQUAMOUS EPITHELIAL CELLS PRESENT MODERATE GRAM NEGATIVE RODS FEW GRAM POSITIVE COCCI RARE GRAM POSITIVE RODS    Culture PENDING  Incomplete   Report Status PENDING  Incomplete         Radiology Studies: Dg Chest 1 View  Result Date: 12/16/2016 CLINICAL DATA:  76 year old male with shortness of breath productive cough and chest congestion for 1 week. Former smoker. EXAM: CHEST 1 VIEW COMPARISON:  Chest CTA 03/13/2016 and earlier. FINDINGS: Portable AP semi upright view at 1541 hours. Stable lung volumes. Stable mild cardiomegaly. Chronic increased interstitial markings, including chronic patchy and reticular opacity at the right lung base. However, there is new chronic left mid and lower lung reticulonodular opacity compared to 2017. No definite pleural effusion. IMPRESSION: Chronic lung disease with superimposed new left mid and lower lung reticulonodular opacity suspicious for multifocal acute infectious exacerbation. Electronically Signed   By: Genevie Ann M.D.   On: 12/16/2016 16:00        Scheduled Meds: . aspirin EC  81 mg Oral Daily  . dextromethorphan-guaiFENesin  1 tablet Oral BID    . enoxaparin (LOVENOX) injection  40 mg Subcutaneous Daily  . ipratropium-albuterol  3 mL Nebulization QID  . mometasone-formoterol  2 puff Inhalation BID  . pantoprazole  40 mg Oral Daily  . predniSONE  40 mg Oral Q supper  . propafenone  300 mg Oral BID  . rosuvastatin  10 mg Oral QHS  . tetrahydrozoline  1 drop Both Eyes Daily   Continuous Infusions: . sodium chloride 75 mL/hr at 12/17/16 1013  . piperacillin-tazobactam (ZOSYN)  IV Stopped (12/18/16 0160)  . vancomycin Stopped (12/18/16 0941)     LOS: 2 days    Time spent: 40 minutes    , Geraldo Docker, MD [facility] Pager [phone]   If 7PM-7AM, please contact night-coverage [email or web address] Password [email or web address] 12/18/2016, 9:28 AM

## 2016-12-19 ENCOUNTER — Encounter (HOSPITAL_COMMUNITY): Payer: Self-pay | Admitting: *Deleted

## 2016-12-19 DIAGNOSIS — N179 Acute kidney failure, unspecified: Secondary | ICD-10-CM

## 2016-12-19 LAB — CBC
HCT: 50.4 % (ref 39.0–52.0)
Hemoglobin: 16 g/dL (ref 13.0–17.0)
MCH: 29.6 pg (ref 26.0–34.0)
MCHC: 31.7 g/dL (ref 30.0–36.0)
MCV: 93.2 fL (ref 78.0–100.0)
PLATELETS: 213 10*3/uL (ref 150–400)
RBC: 5.41 MIL/uL (ref 4.22–5.81)
RDW: 13.9 % (ref 11.5–15.5)
WBC: 15.6 10*3/uL — AB (ref 4.0–10.5)

## 2016-12-19 LAB — CULTURE, RESPIRATORY W GRAM STAIN: Culture: NORMAL

## 2016-12-19 LAB — MAGNESIUM: Magnesium: 2.3 mg/dL (ref 1.7–2.4)

## 2016-12-19 LAB — BASIC METABOLIC PANEL
Anion gap: 10 (ref 5–15)
BUN: 30 mg/dL — ABNORMAL HIGH (ref 6–20)
CO2: 29 mmol/L (ref 22–32)
CREATININE: 1.25 mg/dL — AB (ref 0.61–1.24)
Calcium: 8.4 mg/dL — ABNORMAL LOW (ref 8.9–10.3)
Chloride: 99 mmol/L — ABNORMAL LOW (ref 101–111)
GFR calc non Af Amer: 54 mL/min — ABNORMAL LOW (ref >60)
Glucose, Bld: 129 mg/dL — ABNORMAL HIGH (ref 65–99)
Potassium: 4 mmol/L (ref 3.5–5.1)
SODIUM: 138 mmol/L (ref 135–145)

## 2016-12-19 LAB — CULTURE, RESPIRATORY: SPECIAL REQUESTS: NORMAL

## 2016-12-19 MED ORDER — SALINE SPRAY 0.65 % NA SOLN: 2.0000 | NASAL | Status: DC | PRN

## 2016-12-19 NOTE — Progress Notes (Addendum)
PROGRESS NOTE    Chad Hogan  EXB:[id number] DOB: 1940/11/16 DOA: 12/16/2016 PCP: Myer Peer, MD   Brief Narrative:  76 y.o. WM PMHx Anxiety, HTN, Ischemic Heart Disease, SVT secondary to antidromic AV reentrant tachycardia, NSVT, Wolff-Parkinson-White syndrome, COPD, HLD, OSA on CPAP,   Presented with cough and shortness of breath this been going on for past 1 week patient today called  EMS with me Atrovent 125 of Solu-Medrol on EMS report initially satting 53% in room air but improved to 92% nebulizer treatment. He reports sputum productive of green sputum wheezing, no sick contacts, subjective fever and chills, no chest pain Patient required up to 5 L of oxygen currently satting 87% chest x-ray worrisome for pneumonia. During ER stay patient was noted to have a run of VT. hemodynamically stable currently resolved   Regarding pertinent Chronic problems: Hx of SVT secondary to antidromic AV reentrant tachycardia sp unsuccessful ablation, responded well to propafenone,     Subjective: 9/8 A/O 4, negative CP, negative SOB. Sitting at edge of bed eating dinner comfortably. Unfortunately any exertion and patient quickly desats requiring placement of Ventimask.      Assessment & Plan:   Active Problems:   Essential hypertension   WOLFF (WOLFE)-PARKINSON-WHITE (WPW) SYNDROME   Sleep apnea   Chronic diastolic heart failure (HCC)   CAP (community acquired pneumonia)   Dehydration   CKD (chronic kidney disease), stage III   Acute on chronic respiratory failure with hypoxia (HCC)   NSVT (nonsustained ventricular tachycardia) (HCC)   Sepsis (HCC)   Hypocalcemia     COPD Exacerbation/Multifocal Pneumonia/Sepsis CAP   -Complete seven-day course antibiotics -Prednisone 40 mg daily -Mucinex DM BID -Flutter valve -DuoNeb QID -Dulera BID -Titrate O2 to maintain SPO2 89-93%  Acute on chronic respiratory failure with hypoxia -On 3 L O2 via [place] at home -See COPD  exacerbation -Patient with continued increased O2 demand, euvolemic. Obtain Echocardiogram  -Patient with continued increase in O2 demand, increased WOB obtain CT chest PE protocol  Acute renal failure (baseline Cr 1.1)  Lab Results  Component Value Date   CREATININE 1.25 (H) 12/19/2016   CREATININE 1.16 12/18/2016   CREATININE 1.17 12/17/2016  -resolved   Chronic diastolic CHF (unknown base weight) -Strict I&OSince admission +476ml -Daily weight Filed Weights   12/17/16 0443 12/18/16 0503 12/19/16 0500  Weight: 280 lb 4.8 oz (127.1 kg) 281 lb 11.2 oz (127.8 kg) 279 lb 9.6 oz (126.8 kg)  -Coreg 12.5 mg BID -Lasix 80 mg BID  Wolff-Parkinson-White syndrome -Currently in NSR -Continue Propafenone 300 mg BID  Essential hypertension -See CHF  Hypocalcemia   OSA -CPAP per respiratory     Goals of care -9/5 PT/OT consulted     DVT prophylaxis: Lovenox Code Status: Full Family Communication: None Disposition Plan: TBD   Consultants:  None    Procedures/Significant Events:  None   I have personally reviewed and interpreted all radiology studies and my findings are as above.  VENTILATOR SETTINGS: None   Cultures 9/5 blood NGTD 9/5 MRSA by PCR negative 9/5 sputum pending 9/5 respiratory virus panel negative 9/5 HIV negative     Antimicrobials: Anti-infectives    Start     Stop   12/17/16 0900  vancomycin (VANCOCIN) IVPB 1000 mg/200 mL premix         12/17/16 0300  piperacillin-tazobactam (ZOSYN) IVPB 3.375 g         12/16/16 2030  piperacillin-tazobactam (ZOSYN) IVPB 3.375 g     12/16/16 2053  12/16/16 2030  vancomycin (VANCOCIN) 2,500 mg in sodium chloride 0.9 % 500 mL IVPB     12/16/16 2250   12/16/16 1615  cefTRIAXone (ROCEPHIN) 1 g in dextrose 5 % 50 mL IVPB     12/16/16 1809   12/16/16 1615  azithromycin (ZITHROMAX) 500 mg in dextrose 5 % 250 mL IVPB     12/16/16 1923       Devices    LINES / TUBES:      Continuous  Infusions: . sodium chloride 75 mL/hr at 12/17/16 1013  . piperacillin-tazobactam (ZOSYN)  IV 3.375 g (12/19/16 1258)  . vancomycin Stopped (12/19/16 1052)     Objective: Vitals:   12/19/16 0841 12/19/16 0958 12/19/16 1150 12/19/16 1626  BP:  101/76    Pulse:  75    Resp:      Temp:      TempSrc:      SpO2: (!) 88%  90% 91%  Weight:        Intake/Output Summary (Last 24 hours) at 12/19/16 1647 Last data filed at 12/19/16 1500  Gross per 24 hour  Intake              240 ml  Output             1525 ml  Net            -1285 ml   Filed Weights   12/17/16 0443 12/18/16 0503 12/19/16 0500  Weight: 280 lb 4.8 oz (127.1 kg) 281 lb 11.2 oz (127.8 kg) 279 lb 9.6 oz (126.8 kg)   Physical Exam:  General: A/O 4, positive acute on chronic respiratory distress Neck:  Negative scars, masses, torticollis, lymphadenopathy, JVD Lungs: diffuse poor air movement, however clear to auscultation bilaterally without wheezes or crackles Cardiovascular: Regular rate and rhythm without murmur gallop or rub normal S1 and S2 Abdomen: Morbidly obese, negative abdominal pain, nondistended, positive soft, bowel sounds, no rebound, no ascites, no appreciable mass Extremities: No significant cyanosis, clubbing. Bilateral lower extremity edema 2+ (improving),  Psychiatric:  Negative depression, negative anxiety, negative fatigue, negative mania  Central nervous system:  Cranial nerves II through XII intact, tongue/uvula midline, all extremities muscle strength 5/5, sensation intact throughout, negative dysarthria, negative expressive aphasia, negative receptive aphasia.   .     Data Reviewed: Care during the described time interval was provided by me .  I have reviewed this patient's available data, including medical history, events of note, physical examination, and all test results as part of my evaluation.   CBC:  Recent Labs Lab 12/16/16 1619 12/16/16 1631 12/17/16 0752 12/18/16 0429  12/19/16 0258  WBC 12.1*  --  13.5* 14.4* 15.6*  NEUTROABS 9.7*  --   --   --   --   HGB 15.2 16.7 14.7 13.9 16.0  HCT 47.9 49.0 46.5 43.6 50.4  MCV 92.6  --  93.9 93.6 93.2  PLT 218  --  196 197 073   Basic Metabolic Panel:  Recent Labs Lab 12/16/16 1619 12/16/16 1631 12/16/16 2356 12/17/16 0752 12/18/16 0429 12/19/16 0258  NA 137 137  --  138 135 138  K 4.1 3.9  --  4.1 3.9 4.0  CL 98* 98*  --  100* 100* 99*  CO2 25  --   --  27 29 29   GLUCOSE 100* 105*  --  142* 124* 129*  BUN 38* 45*  --  35* 42* 30*  CREATININE 1.29* 1.20  --  1.17  1.16 1.25*  CALCIUM 8.1*  --   --  8.0* 8.1* 8.4*  MG  --   --  2.7* 2.9* 2.9* 2.3  PHOS  --   --  3.3 3.7  --   --    GFR: Estimated Creatinine Clearance: 67.1 mL/min (A) (by C-G formula based on SCr of 1.25 mg/dL (H)). Liver Function Tests:  Recent Labs Lab 12/16/16 1619 12/17/16 0752  AST 39 38  ALT 25 29  ALKPHOS 131* 128*  BILITOT 0.9 0.7  PROT 6.3* 7.6  ALBUMIN 2.6* 2.5*   No results for input(s): LIPASE, AMYLASE in the last 168 hours. No results for input(s): AMMONIA in the last 168 hours. Coagulation Profile:  Recent Labs Lab 12/16/16 2356  INR 1.18   Cardiac Enzymes:  Recent Labs Lab 12/16/16 2356 12/17/16 0752  TROPONINI <0.03 0.05*   BNP (last 3 results) No results for input(s): PROBNP in the last 8760 hours. HbA1C: No results for input(s): HGBA1C in the last 72 hours. CBG: No results for input(s): GLUCAP in the last 168 hours. Lipid Profile: No results for input(s): CHOL, HDL, LDLCALC, TRIG, CHOLHDL, LDLDIRECT in the last 72 hours. Thyroid Function Tests:  Recent Labs  12/17/16 0302  TSH 0.480   Anemia Panel: No results for input(s): VITAMINB12, FOLATE, FERRITIN, TIBC, IRON, RETICCTPCT in the last 72 hours. Urine analysis:    Component Value Date/Time   COLORURINE BROWN (A) 07/27/2016 2049   APPEARANCEUR CLOUDY (A) 07/27/2016 2049   LABSPEC 1.023 07/27/2016 2049   PHURINE 5.0 07/27/2016  2049   GLUCOSEU NEGATIVE 07/27/2016 2049   HGBUR MODERATE (A) 07/27/2016 2049   [place] NEGATIVE 07/27/2016 2049   [place] 07/27/2016 2049   PROTEINUR 100 (A) 07/27/2016 2049   NITRITE NEGATIVE 07/27/2016 2049   LEUKOCYTESUR NEGATIVE 07/27/2016 2049   Sepsis Labs: @LABRCNTIP (procalcitonin:4,lacticidven:4)  ) Recent Results (from the past 240 hour(s))  Culture, blood (Routine X 2) w Reflex to ID Panel     Status: None (Preliminary result)   Collection Time: 12/16/16  4:19 PM  Result Value Ref Range Status   Specimen Description BLOOD LEFT WRIST  Final   Special Requests   Final    BOTTLES DRAWN AEROBIC AND ANAEROBIC Blood Culture adequate volume   Culture NO GROWTH 3 DAYS  Final   Report Status PENDING  Incomplete  Culture, blood (Routine X 2) w Reflex to ID Panel     Status: None (Preliminary result)   Collection Time: 12/16/16  5:00 PM  Result Value Ref Range Status   Specimen Description BLOOD LEFT HAND  Final   Special Requests   Final    BOTTLES DRAWN AEROBIC AND ANAEROBIC Blood Culture adequate volume   Culture NO GROWTH 3 DAYS  Final   Report Status PENDING  Incomplete  MRSA PCR Screening     Status: None   Collection Time: 12/16/16 11:34 PM  Result Value Ref Range Status   MRSA by PCR NEGATIVE NEGATIVE Final    Comment:        The GeneXpert MRSA Assay (FDA approved for NASAL specimens only), is one component of a comprehensive MRSA colonization surveillance program. It is not intended to diagnose MRSA infection nor to guide or monitor treatment for MRSA infections.   Respiratory Panel by PCR     Status: None   Collection Time: 12/16/16 11:34 PM  Result Value Ref Range Status   Adenovirus NOT DETECTED NOT DETECTED Final   Coronavirus 229E NOT DETECTED NOT DETECTED Final  Coronavirus HKU1 NOT DETECTED NOT DETECTED Final   Coronavirus NL63 NOT DETECTED NOT DETECTED Final   Coronavirus OC43 NOT DETECTED NOT DETECTED Final   Metapneumovirus NOT  DETECTED NOT DETECTED Final   Rhinovirus / Enterovirus NOT DETECTED NOT DETECTED Final   Influenza A NOT DETECTED NOT DETECTED Final   Influenza B NOT DETECTED NOT DETECTED Final   Parainfluenza Virus 1 NOT DETECTED NOT DETECTED Final   Parainfluenza Virus 2 NOT DETECTED NOT DETECTED Final   Parainfluenza Virus 3 NOT DETECTED NOT DETECTED Final   Parainfluenza Virus 4 NOT DETECTED NOT DETECTED Final   Respiratory Syncytial Virus NOT DETECTED NOT DETECTED Final   Bordetella pertussis NOT DETECTED NOT DETECTED Final   Chlamydophila pneumoniae NOT DETECTED NOT DETECTED Final   Mycoplasma pneumoniae NOT DETECTED NOT DETECTED Final  Culture, sputum-assessment     Status: None   Collection Time: 12/16/16 11:57 PM  Result Value Ref Range Status   Specimen Description Expect. Sput  Final   Special Requests Normal  Final   Sputum evaluation THIS SPECIMEN IS ACCEPTABLE FOR SPUTUM CULTURE  Final   Report Status 12/17/2016 FINAL  Final  Culture, respiratory (NON-Expectorated)     Status: None   Collection Time: 12/16/16 11:57 PM  Result Value Ref Range Status   Specimen Description Expect. Sput  Final   Special Requests Normal Reflexed from [id number]  Final   Gram Stain   Final    ABUNDANT WBC PRESENT, PREDOMINANTLY PMN FEW SQUAMOUS EPITHELIAL CELLS PRESENT MODERATE GRAM NEGATIVE RODS FEW GRAM POSITIVE COCCI RARE GRAM POSITIVE RODS    Culture Consistent with normal respiratory flora.  Final   Report Status 12/19/2016 FINAL  Final         Radiology Studies: No results found.      Scheduled Meds: . aspirin EC  81 mg Oral Daily  . carvedilol  12.5 mg Oral BID WC  . dextromethorphan-guaiFENesin  1 tablet Oral BID  . enoxaparin (LOVENOX) injection  40 mg Subcutaneous Daily  . furosemide  80 mg Intravenous BID  . ipratropium-albuterol  3 mL Nebulization QID  . mometasone-formoterol  2 puff Inhalation BID  . pantoprazole  40 mg Oral Daily  . predniSONE  40 mg Oral Q supper  .  propafenone  300 mg Oral BID  . rosuvastatin  10 mg Oral QHS  . tetrahydrozoline  1 drop Both Eyes Daily   Continuous Infusions: . sodium chloride 75 mL/hr at 12/17/16 1013  . piperacillin-tazobactam (ZOSYN)  IV 3.375 g (12/19/16 1258)  . vancomycin Stopped (12/19/16 1052)     LOS: 3 days    Time spent: 40 minutes    Sawsan Riggio, Geraldo Docker, MD [facility] Pager [phone]   If 7PM-7AM, please contact night-coverage [email or web address] Password [facility] 12/19/2016, 4:47 PM

## 2016-12-19 NOTE — Progress Notes (Signed)
Placed pt on 10L high flow so he could eat and not have to use the venti mask

## 2016-12-19 NOTE — Evaluation (Signed)
Occupational Therapy Evaluation Patient Details Name: Chad Hogan MRN: [id number] DOB: [birth year] Today's Date: 12/19/2016    History of Present Illness Patient is a 76 yo male presenting to the ED (12/16/16) with complaints of shortness of breath.  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 Patient is on droplet precautions possibly due to suspicion of pneumonia as evidenced by chest X-ray revealing exacerbation of lung tissue.   Clinical Impression   Pt was avoiding socks and shoes requiring tying. He was sponge bathing. Pt reports his home needs many repairs, including the shower. Pt presents with decreased activity tolerance and some mild standing imbalance interfering with ability to perform self care. Pt likely to progress to return to his baseline. Will follow.    Follow Up Recommendations  No OT follow up    Equipment Recommendations  None recommended by OT    Recommendations for Other Services       Precautions / Restrictions Precautions Precautions: None Precaution Comments: watch 02 sats      Mobility Bed Mobility               General bed mobility comments: Patient's head of bed elevated and patient used railing to roll over to sidelying.  Transfers Overall transfer level: Needs assistance Equipment used: None Transfers: Sit to/from Stand Sit to Stand: Supervision         General transfer comment: for safety    Balance Overall balance assessment: Needs assistance   Sitting balance-Leahy Scale: Good       Standing balance-Leahy Scale: Fair                             ADL either performed or assessed with clinical judgement   ADL Overall ADL's : Needs assistance/impaired Eating/Feeding: Independent;Sitting   Grooming: Wash/dry hands;Wash/dry face;Oral care;Brushing hair;Sitting;Set up   Upper Body Bathing: Set up;Sitting   Lower  Body Bathing: Sit to/from stand;Minimal assistance   Upper Body Dressing : Set up;Sitting   Lower Body Dressing: Sit to/from stand;Minimal assistance   Toilet Transfer: Ambulation;BSC;Supervision/safety   Toileting- Clothing Manipulation and Hygiene: Sit to/from stand;Supervision/safety       Functional mobility during ADLs: Min guard General ADL Comments: Pt with 02 sats in high 80s on 6l     Vision Baseline Vision/History: Wears glasses Patient Visual Report: No change from baseline       Perception     Praxis      Pertinent Vitals/Pain Pain Assessment: No/denies pain     Hand Dominance Right   Extremity/Trunk Assessment Upper Extremity Assessment Upper Extremity Assessment: Generalized weakness   Lower Extremity Assessment Lower Extremity Assessment: Defer to PT evaluation   Cervical / Trunk Assessment Cervical / Trunk Exceptions: obesity   Communication Communication Communication: No difficulties   Cognition Arousal/Alertness: Awake/alert Behavior During Therapy: Flat affect Overall Cognitive Status: Within Functional Limits for tasks assessed                                     General Comments       Exercises     Shoulder Instructions      Home Living Family/patient expects to be discharged to:: Private residence Living Arrangements: Alone Available Help at Discharge: Family;Available PRN/intermittently Type of Home: House Home Access: Level entry  Home Layout: One level     Bathroom Shower/Tub: Other (comment) (pt does not have a working shower)   Biochemist, clinical: Standard     Home Equipment: Other (comment) (02 for night, CPAP)          Prior Functioning/Environment Level of Independence: Independent        Comments: pt avoids socks and wears slip on shoes, sponge bathes        OT Problem List: Impaired balance (sitting and/or standing);Obesity;Cardiopulmonary status limiting activity      OT  Treatment/Interventions: Self-care/ADL training;Energy conservation;DME and/or AE instruction;Patient/family education    OT Goals(Current goals can be found in the care plan section) Acute Rehab OT Goals Patient Stated Goal: Patient wants to go home and return to completing all ADL's indep. OT Goal Formulation: With patient Time For Goal Achievement: 12/26/16 Potential to Achieve Goals: Good ADL Goals Pt Will Perform Grooming: with modified independence;standing Pt Will Perform Lower Body Bathing: with modified independence;with adaptive equipment;sit to/from stand Pt Will Perform Lower Body Dressing: with modified independence;with adaptive equipment;sit to/from stand Pt Will Transfer to Toilet: with modified independence;ambulating;regular height toilet Pt Will Perform Toileting - Clothing Manipulation and hygiene: with modified independence;sit to/from stand Additional ADL Goal #1: Pt will generalize energy conservation strategies in ADL and mobility independently.  OT Frequency: Min 2X/week   Barriers to D/C:            Co-evaluation              AM-PAC PT "6 Clicks" Daily Activity     Outcome Measure Help from another person eating meals?: None Help from another person taking care of personal grooming?: A Little Help from another person toileting, which includes using toliet, bedpan, or urinal?: A Little Help from another person bathing (including washing, rinsing, drying)?: A Little Help from another person to put on and taking off regular upper body clothing?: None Help from another person to put on and taking off regular lower body clothing?: A Little 6 Click Score: 20   End of Session Equipment Utilized During Treatment: Oxygen  Activity Tolerance: Patient limited by fatigue Patient left: in bed;with call bell/phone within reach  OT Visit Diagnosis: Unsteadiness on feet (R26.81)                Time: 2751-7001 OT Time Calculation (min): 26 min Charges:  OT  General Charges $OT Visit: 1 Visit OT Evaluation $OT Eval Moderate Complexity: 1 Mod G-Codes:     2017/01/13 Nestor Lewandowsky, OTR/L Pager: [phone] Werner Lean, Haze Boyden 13-Jan-2017, 1:51 PM

## 2016-12-20 ENCOUNTER — Encounter (HOSPITAL_COMMUNITY): Payer: Self-pay | Admitting: Radiology

## 2016-12-20 ENCOUNTER — Inpatient Hospital Stay (HOSPITAL_COMMUNITY): Payer: Medicare HMO

## 2016-12-20 DIAGNOSIS — R0602 Shortness of breath: Secondary | ICD-10-CM

## 2016-12-20 LAB — BASIC METABOLIC PANEL
Anion gap: 10 (ref 5–15)
BUN: 28 mg/dL — ABNORMAL HIGH (ref 6–20)
CALCIUM: 7.9 mg/dL — AB (ref 8.9–10.3)
CO2: 33 mmol/L — ABNORMAL HIGH (ref 22–32)
CREATININE: 1.26 mg/dL — AB (ref 0.61–1.24)
Chloride: 93 mmol/L — ABNORMAL LOW (ref 101–111)
GFR, EST NON AFRICAN AMERICAN: 53 mL/min — AB (ref >60)
Glucose, Bld: 149 mg/dL — ABNORMAL HIGH (ref 65–99)
Potassium: 3.6 mmol/L (ref 3.5–5.1)
SODIUM: 136 mmol/L (ref 135–145)

## 2016-12-20 LAB — ECHOCARDIOGRAM COMPLETE
HEIGHTINCHES: 71 in
WEIGHTICAEL: 4372.8 [oz_av]

## 2016-12-20 LAB — CBC
HEMATOCRIT: 46.2 % (ref 39.0–52.0)
Hemoglobin: 14.7 g/dL (ref 13.0–17.0)
MCH: 29.4 pg (ref 26.0–34.0)
MCHC: 31.8 g/dL (ref 30.0–36.0)
MCV: 92.4 fL (ref 78.0–100.0)
PLATELETS: 174 10*3/uL (ref 150–400)
RBC: 5 MIL/uL (ref 4.22–5.81)
RDW: 13.7 % (ref 11.5–15.5)
WBC: 12.9 10*3/uL — AB (ref 4.0–10.5)

## 2016-12-20 LAB — MAGNESIUM: MAGNESIUM: 1.9 mg/dL (ref 1.7–2.4)

## 2016-12-20 MED ORDER — PERFLUTREN LIPID MICROSPHERE
1.0000 mL | INTRAVENOUS | Status: AC | PRN
  Administered 2016-12-20: 2 mL via INTRAVENOUS
  Filled 2016-12-20: qty 10

## 2016-12-20 MED ORDER — IOPAMIDOL (ISOVUE-370) INJECTION 76%
INTRAVENOUS | Status: AC
  Administered 2016-12-20: 100 mL
  Filled 2016-12-20: qty 100

## 2016-12-20 MED ORDER — FUROSEMIDE 10 MG/ML IJ SOLN
80.0000 mg | Freq: Every day | INTRAMUSCULAR | Status: DC
  Administered 2016-12-21 – 2016-12-23 (×3): 80 mg via INTRAVENOUS
  Filled 2016-12-20 (×3): qty 8

## 2016-12-20 NOTE — Progress Notes (Signed)
Patient refused CPAP for HS. Encouraged patient to call for Respiratory if he would like to use CPAP.

## 2016-12-20 NOTE — Progress Notes (Signed)
PROGRESS NOTE    Chad Hogan  AOZ:[id number] DOB: Mar 30, 1941 DOA: 12/16/2016 PCP: Myer Peer, MD   Brief Narrative:  76 y.o. WM PMHx Anxiety, HTN, Ischemic Heart Disease, SVT secondary to antidromic AV reentrant tachycardia, NSVT, Wolff-Parkinson-White syndrome, COPD (on 3 L O2 at home), HLD, OSA on CPAP,   Presented with cough and shortness of breath this been going on for past 1 week patient today called  EMS with me Atrovent 125 of Solu-Medrol on EMS report initially satting 53% in room air but improved to 92% nebulizer treatment. He reports sputum productive of green sputum wheezing, no sick contacts, subjective fever and chills, no chest pain Patient required up to 5 L of oxygen currently satting 87% chest x-ray worrisome for pneumonia. During ER stay patient was noted to have a run of VT. hemodynamically stable currently resolved   Regarding pertinent Chronic problems: Hx of SVT secondary to antidromic AV reentrant tachycardia sp unsuccessful ablation, responded well to propafenone,     Subjective: 9/9 A/O 4, negative CP, negative SOB lying in bed comfortably.     Assessment & Plan:   Active Problems:   Essential hypertension   WOLFF (WOLFE)-PARKINSON-WHITE (WPW) SYNDROME   Sleep apnea   Chronic diastolic heart failure (HCC)   CAP (community acquired pneumonia)   Dehydration   CKD (chronic kidney disease), stage III   Acute on chronic respiratory failure with hypoxia (HCC)   NSVT (nonsustained ventricular tachycardia) (HCC)   Sepsis (HCC)   Hypocalcemia     COPD Exacerbation/Multifocal Pneumonia/Sepsis CAP   -Complete seven-day course antibiotics -Prednisone 40 mg daily -Mucinex DM BID -Flutter valve -DuoNeb QID -Dulera BID -Titrate O2 to maintain SPO2 89-93%  Acute on chronic respiratory failure with hypoxia -3 L O2 via [place] at home -See COPD exacerbation -Echocardiogram: Consistent with diastolic CHF see results below  -CT chest PE protocol: Consistent  with multifocal pneumonia, negative PE   Acute renal failure (baseline Cr 1.1)  Lab Results  Component Value Date   CREATININE 1.26 (H) 12/20/2016   CREATININE 1.25 (H) 12/19/2016   CREATININE 1.16 12/18/2016  -resolved   Chronic diastolic CHF (unknown base weight) -Strict I&OSince admission -1.5 L -Daily weight Filed Weights   12/18/16 0503 12/19/16 0500 12/20/16 0341  Weight: 281 lb 11.2 oz (127.8 kg) 279 lb 9.6 oz (126.8 kg) 273 lb 4.8 oz (124 kg)  -Coreg 12.5 mg BID -9/9 decrease Lasix 80 mg daily increasing creatinine  Wolff-Parkinson-White syndrome -Currently in NSR -Propafenone 300 mg BID  Essential hypertension -See CHF  Hypocalcemia   OSA -CPAP per respiratory     Goals of care -Consulted on 9/5 PT/OT: No follow-up recommended. Disagree with assessment patient extremely deconditioned, when closer to discharge would have reevaluated     DVT prophylaxis: Lovenox Code Status: Full Family Communication: None Disposition Plan: TBD   Consultants:  None    Procedures/Significant Events:  9/9 CT angiogram chest PE protocol:No definite evidence of pulmonary embolus.   4.2 cm ascending thoracic aortic aneurysm. Recommend annual imaging followup by CTA or MRA. This recommendation follows 2010 ACCF/AHA/AATS/ACR/ASA/SCA/SCAI/SIR/STS/SVM Guidelines for the Diagnosis and Management of Patients with Thoracic Aortic Disease. Circulation. 2010; 121: N629-B284.   Multifocal airspace opacities are noted in both lungs, particularly in both lung bases and right upper lobe laterally. These are concerning for pneumonia or atelectasis. 9/9 Echocardiogram: LVEF= 60-65%.-Grade 1 diastolic dysfunction   I have personally reviewed and interpreted all radiology studies and my findings are as above.  VENTILATOR SETTINGS:  None   Cultures 9/5 blood NGTD 9/5 MRSA by PCR negative 9/5 sputum pending 9/5 respiratory virus panel negative 9/5 HIV  negative     Antimicrobials: Anti-infectives    Start     Stop   12/17/16 0900  vancomycin (VANCOCIN) IVPB 1000 mg/200 mL premix         12/17/16 0300  piperacillin-tazobactam (ZOSYN) IVPB 3.375 g         12/16/16 2030  piperacillin-tazobactam (ZOSYN) IVPB 3.375 g     12/16/16 2053   12/16/16 2030  vancomycin (VANCOCIN) 2,500 mg in sodium chloride 0.9 % 500 mL IVPB     12/16/16 2250   12/16/16 1615  cefTRIAXone (ROCEPHIN) 1 g in dextrose 5 % 50 mL IVPB     12/16/16 1809   12/16/16 1615  azithromycin (ZITHROMAX) 500 mg in dextrose 5 % 250 mL IVPB     12/16/16 1923       Devices    LINES / TUBES:      Continuous Infusions: . sodium chloride 75 mL/hr at 12/17/16 1013  . piperacillin-tazobactam (ZOSYN)  IV Stopped (12/20/16 0731)  . vancomycin Stopped (12/19/16 2229)     Objective: Vitals:   12/19/16 2018 12/19/16 2348 12/20/16 0341 12/20/16 0746  BP:  (!) 123/54 (!) 153/77   Pulse:  66 65   Resp:  (!) 22 19   Temp:  98.2 F (36.8 C) 98 F (36.7 C)   TempSrc:  Oral Oral   SpO2: (!) 89% 94% 91% 91%  Weight:   273 lb 4.8 oz (124 kg)   Height:        Intake/Output Summary (Last 24 hours) at 12/20/16 0748 Last data filed at 12/20/16 0341  Gross per 24 hour  Intake                0 ml  Output             1700 ml  Net            -1700 ml   Filed Weights   12/18/16 0503 12/19/16 0500 12/20/16 0341  Weight: 281 lb 11.2 oz (127.8 kg) 279 lb 9.6 oz (126.8 kg) 273 lb 4.8 oz (124 kg)  Physical Exam:  General: A/O 4, positive acute on chronic respiratory distress  Neck:  Negative scars, masses, torticollis, lymphadenopathy, JVD Lungs: diffuse poor air movement, Clear to auscultation bilaterally without wheezes or crackles Cardiovascular: Regular rate and rhythm without murmur gallop or rub normal S1 and S2 Abdomen: negative abdominal pain, nondistended, positive soft, bowel sounds, no rebound, no ascites, no appreciable mass Extremities: No significant cyanosis,  clubbing, or edema bilateral lower extremities Psychiatric:  Negative depression, negative anxiety, negative fatigue, negative mania  Central nervous system:  Cranial nerves II through XII intact, tongue/uvula midline, all extremities muscle strength 5/5, sensation intact throughout, negative dysarthria, negative expressive aphasia, negative receptive aphasia.    .     Data Reviewed: Care during the described time interval was provided by me .  I have reviewed this patient's available data, including medical history, events of note, physical examination, and all test results as part of my evaluation.   CBC:  Recent Labs Lab 12/16/16 1619 12/16/16 1631 12/17/16 0752 12/18/16 0429 12/19/16 0258 12/20/16 0239  WBC 12.1*  --  13.5* 14.4* 15.6* 12.9*  NEUTROABS 9.7*  --   --   --   --   --   HGB 15.2 16.7 14.7 13.9 16.0 14.7  HCT  47.9 49.0 46.5 43.6 50.4 46.2  MCV 92.6  --  93.9 93.6 93.2 92.4  PLT 218  --  196 197 213 166   Basic Metabolic Panel:  Recent Labs Lab 12/16/16 1619 12/16/16 1631 12/16/16 2356 12/17/16 0752 12/18/16 0429 12/19/16 0258 12/20/16 0239  NA 137 137  --  138 135 138 136  K 4.1 3.9  --  4.1 3.9 4.0 3.6  CL 98* 98*  --  100* 100* 99* 93*  CO2 25  --   --  27 29 29  33*  GLUCOSE 100* 105*  --  142* 124* 129* 149*  BUN 38* 45*  --  35* 42* 30* 28*  CREATININE 1.29* 1.20  --  1.17 1.16 1.25* 1.26*  CALCIUM 8.1*  --   --  8.0* 8.1* 8.4* 7.9*  MG  --   --  2.7* 2.9* 2.9* 2.3 1.9  PHOS  --   --  3.3 3.7  --   --   --    GFR: Estimated Creatinine Clearance: 65.8 mL/min (A) (by C-G formula based on SCr of 1.26 mg/dL (H)). Liver Function Tests:  Recent Labs Lab 12/16/16 1619 12/17/16 0752  AST 39 38  ALT 25 29  ALKPHOS 131* 128*  BILITOT 0.9 0.7  PROT 6.3* 7.6  ALBUMIN 2.6* 2.5*   No results for input(s): LIPASE, AMYLASE in the last 168 hours. No results for input(s): AMMONIA in the last 168 hours. Coagulation Profile:  Recent Labs Lab  12/16/16 2356  INR 1.18   Cardiac Enzymes:  Recent Labs Lab 12/16/16 2356 12/17/16 0752  TROPONINI <0.03 0.05*   BNP (last 3 results) No results for input(s): PROBNP in the last 8760 hours. HbA1C: No results for input(s): HGBA1C in the last 72 hours. CBG: No results for input(s): GLUCAP in the last 168 hours. Lipid Profile: No results for input(s): CHOL, HDL, LDLCALC, TRIG, CHOLHDL, LDLDIRECT in the last 72 hours. Thyroid Function Tests: No results for input(s): TSH, T4TOTAL, FREET4, T3FREE, THYROIDAB in the last 72 hours. Anemia Panel: No results for input(s): VITAMINB12, FOLATE, FERRITIN, TIBC, IRON, RETICCTPCT in the last 72 hours. Urine analysis:    Component Value Date/Time   COLORURINE BROWN (A) 07/27/2016 2049   APPEARANCEUR CLOUDY (A) 07/27/2016 2049   LABSPEC 1.023 07/27/2016 2049   PHURINE 5.0 07/27/2016 2049   GLUCOSEU NEGATIVE 07/27/2016 2049   HGBUR MODERATE (A) 07/27/2016 2049   BILIRUBINUR NEGATIVE 07/27/2016 2049   [place] 07/27/2016 2049   PROTEINUR 100 (A) 07/27/2016 2049   NITRITE NEGATIVE 07/27/2016 2049   LEUKOCYTESUR NEGATIVE 07/27/2016 2049   Sepsis Labs: @LABRCNTIP (procalcitonin:4,lacticidven:4)  ) Recent Results (from the past 240 hour(s))  Culture, blood (Routine X 2) w Reflex to ID Panel     Status: None (Preliminary result)   Collection Time: 12/16/16  4:19 PM  Result Value Ref Range Status   Specimen Description BLOOD LEFT WRIST  Final   Special Requests   Final    BOTTLES DRAWN AEROBIC AND ANAEROBIC Blood Culture adequate volume   Culture NO GROWTH 3 DAYS  Final   Report Status PENDING  Incomplete  Culture, blood (Routine X 2) w Reflex to ID Panel     Status: None (Preliminary result)   Collection Time: 12/16/16  5:00 PM  Result Value Ref Range Status   Specimen Description BLOOD LEFT HAND  Final   Special Requests   Final    BOTTLES DRAWN AEROBIC AND ANAEROBIC Blood Culture adequate volume   Culture  NO GROWTH 3 DAYS   Final   Report Status PENDING  Incomplete  MRSA PCR Screening     Status: None   Collection Time: 12/16/16 11:34 PM  Result Value Ref Range Status   MRSA by PCR NEGATIVE NEGATIVE Final    Comment:        The GeneXpert MRSA Assay (FDA approved for NASAL specimens only), is one component of a comprehensive MRSA colonization surveillance program. It is not intended to diagnose MRSA infection nor to guide or monitor treatment for MRSA infections.   Respiratory Panel by PCR     Status: None   Collection Time: 12/16/16 11:34 PM  Result Value Ref Range Status   Adenovirus NOT DETECTED NOT DETECTED Final   Coronavirus 229E NOT DETECTED NOT DETECTED Final   Coronavirus HKU1 NOT DETECTED NOT DETECTED Final   Coronavirus NL63 NOT DETECTED NOT DETECTED Final   Coronavirus OC43 NOT DETECTED NOT DETECTED Final   Metapneumovirus NOT DETECTED NOT DETECTED Final   Rhinovirus / Enterovirus NOT DETECTED NOT DETECTED Final   Influenza A NOT DETECTED NOT DETECTED Final   Influenza B NOT DETECTED NOT DETECTED Final   Parainfluenza Virus 1 NOT DETECTED NOT DETECTED Final   Parainfluenza Virus 2 NOT DETECTED NOT DETECTED Final   Parainfluenza Virus 3 NOT DETECTED NOT DETECTED Final   Parainfluenza Virus 4 NOT DETECTED NOT DETECTED Final   Respiratory Syncytial Virus NOT DETECTED NOT DETECTED Final   Bordetella pertussis NOT DETECTED NOT DETECTED Final   Chlamydophila pneumoniae NOT DETECTED NOT DETECTED Final   Mycoplasma pneumoniae NOT DETECTED NOT DETECTED Final  Culture, sputum-assessment     Status: None   Collection Time: 12/16/16 11:57 PM  Result Value Ref Range Status   Specimen Description Expect. Sput  Final   Special Requests Normal  Final   Sputum evaluation THIS SPECIMEN IS ACCEPTABLE FOR SPUTUM CULTURE  Final   Report Status 12/17/2016 FINAL  Final  Culture, respiratory (NON-Expectorated)     Status: None   Collection Time: 12/16/16 11:57 PM  Result Value Ref Range Status    Specimen Description Expect. Sput  Final   Special Requests Normal Reflexed from [id number]  Final   Gram Stain   Final    ABUNDANT WBC PRESENT, PREDOMINANTLY PMN FEW SQUAMOUS EPITHELIAL CELLS PRESENT MODERATE GRAM NEGATIVE RODS FEW GRAM POSITIVE COCCI RARE GRAM POSITIVE RODS    Culture Consistent with normal respiratory flora.  Final   Report Status 12/19/2016 FINAL  Final         Radiology Studies: No results found.      Scheduled Meds: . aspirin EC  81 mg Oral Daily  . carvedilol  12.5 mg Oral BID WC  . dextromethorphan-guaiFENesin  1 tablet Oral BID  . enoxaparin (LOVENOX) injection  40 mg Subcutaneous Daily  . furosemide  80 mg Intravenous BID  . ipratropium-albuterol  3 mL Nebulization QID  . mometasone-formoterol  2 puff Inhalation BID  . pantoprazole  40 mg Oral Daily  . predniSONE  40 mg Oral Q supper  . propafenone  300 mg Oral BID  . rosuvastatin  10 mg Oral QHS  . tetrahydrozoline  1 drop Both Eyes Daily   Continuous Infusions: . sodium chloride 75 mL/hr at 12/17/16 1013  . piperacillin-tazobactam (ZOSYN)  IV Stopped (12/20/16 0731)  . vancomycin Stopped (12/19/16 2229)     LOS: 4 days    Time spent: 40 minutes    Tayshawn Purnell, Geraldo Docker, MD [facility] Pager [phone]  If 7PM-7AM, please contact night-coverage [email or web address] Password [email or web address] 12/20/2016, 7:48 AM

## 2016-12-20 NOTE — Progress Notes (Signed)
  Echocardiogram 2D Echocardiogram with definity has been performed.  Chad Hogan 12/20/2016, 2:02 PM

## 2016-12-21 LAB — BASIC METABOLIC PANEL
Anion gap: 9 (ref 5–15)
BUN: 23 mg/dL — AB (ref 6–20)
CALCIUM: 8 mg/dL — AB (ref 8.9–10.3)
CO2: 36 mmol/L — AB (ref 22–32)
CREATININE: 1.22 mg/dL (ref 0.61–1.24)
Chloride: 93 mmol/L — ABNORMAL LOW (ref 101–111)
GFR calc non Af Amer: 55 mL/min — ABNORMAL LOW (ref >60)
Glucose, Bld: 166 mg/dL — ABNORMAL HIGH (ref 65–99)
Potassium: 3.4 mmol/L — ABNORMAL LOW (ref 3.5–5.1)
SODIUM: 138 mmol/L (ref 135–145)

## 2016-12-21 LAB — CBC
HCT: 45.5 % (ref 39.0–52.0)
Hemoglobin: 14.4 g/dL (ref 13.0–17.0)
MCH: 29.3 pg (ref 26.0–34.0)
MCHC: 31.6 g/dL (ref 30.0–36.0)
MCV: 92.7 fL (ref 78.0–100.0)
PLATELETS: 182 10*3/uL (ref 150–400)
RBC: 4.91 MIL/uL (ref 4.22–5.81)
RDW: 13.7 % (ref 11.5–15.5)
WBC: 13.4 10*3/uL — AB (ref 4.0–10.5)

## 2016-12-21 LAB — CULTURE, BLOOD (ROUTINE X 2)
CULTURE: NO GROWTH
Culture: NO GROWTH
Special Requests: ADEQUATE
Special Requests: ADEQUATE

## 2016-12-21 LAB — MAGNESIUM: MAGNESIUM: 2.1 mg/dL (ref 1.7–2.4)

## 2016-12-21 MED ORDER — POTASSIUM CHLORIDE CRYS ER 20 MEQ PO TBCR
40.0000 meq | EXTENDED_RELEASE_TABLET | Freq: Every day | ORAL | Status: DC
  Administered 2016-12-21 – 2017-01-01 (×12): 40 meq via ORAL
  Filled 2016-12-21 (×12): qty 2

## 2016-12-21 NOTE — Progress Notes (Signed)
Pharmacy Antibiotic Note  Chad Hogan is a 76 y.o. male admitted on 12/16/2016 with sepsis.  Pharmacy has been consulted for Zosyn and vancomycin dosing (day 6 of antibiotics).  Per MD notes, plan 7d course of antibiotics.   Plan: Vancomycin 1000 IV every 12 hours.  Goal trough 15-20 mcg/mL.  Zosyn 3.375 gm IV Q 8 hours Follow-up stop date of antibiotics for 9/11 Monitor CBC, renal fx, cultures and clinical progress   Height: 5\' 11"  (180.3 cm) Weight: 271 lb 6.2 oz (123.1 kg) IBW/kg (Calculated) : 75.3  Temp (24hrs), Avg:98 F (36.7 C), Min:97.9 F (36.6 C), Max:98.2 F (36.8 C)   Recent Labs Lab 12/16/16 1631 12/16/16 2356 12/17/16 0302 12/17/16 0752 12/18/16 0429 12/19/16 0258 12/20/16 0239 12/21/16 0142  WBC  --   --   --  13.5* 14.4* 15.6* 12.9* 13.4*  CREATININE 1.20  --   --  1.17 1.16 1.25* 1.26* 1.22  LATICACIDVEN 2.29* 2.1* 1.7  --   --   --   --   --     Estimated Creatinine Clearance: 67.7 mL/min (by C-G formula based on SCr of 1.22 mg/dL).    Allergies  Allergen Reactions  . Eliquis [Apixaban] Other (See Comments)    Bleeding event  . Codeine Other (See Comments)    Cant remember    Antimicrobials this admission: Vanc 9/5 >>  Zosyn 9/5 >>   Dose adjustments this admission: None   Microbiology results: 9/5 BCx: NGTD 9/5 Resp panel: neg 9/5 Sputum: rare GPC/GPR, few squamous epithelial cells  9/5 MRSA PCR: negative  Thank you for allowing pharmacy to be a part of this patient's care.  [employer], Pharm.D., BCPS Clinical Pharmacist Pager: [phone] Clinical phone for 12/21/2016 from 8:30-4:00 is [phone]. After 4pm, please call Main Rx [phone]) for assistance. 12/21/2016 3:34 PM

## 2016-12-21 NOTE — Consult Note (Signed)
[facility] Primary Care Navigator  12/21/2016  NATHAN MOCTEZUMA 12-May-1940 [id number]    Met with patientat the bedside to identify possible discharge needs. Patient reports having fever, chills, productive cough, shortness of breath and loss of appetite going on for past a week that had led to this admission.  Patientendorses Dr. Ann Held with [facility] as the primary care provider.   Patient shared using [place] [facility] Delivery service to obtain medications without anyproblem.   Patient reports managing his medications at home using "pill box" system filled weekly.  Patient verbalized that he was driving prior to admission. His son Bernell List), daughter Otila Kluver) or sister Fraser Din) will be providing transportation after discharge.   Patient's family work per patient. He mentioned being the caregiver for himself when discharged.  [facility] list of personal care services provided to patient for back-up resource when needed, with the understanding that he will pay out of the pocket for it.  Anticipated discharge plan is home with family's assistance when needed per patient.   Patient voiced understanding to call primary care provider's office when he returns home, for a post discharge follow-up appointment within a week or sooner if needs arise. Patient letter (with PCP's contact number) provided as a reminder.   Patient was admitted with active problems of COPD exacerbation, HF and Pneumonia.  Patient admits lack of knowledge with the signs and symptoms of COPD, Pneumonia and HF that will need medical assistance. He also expressed not beingaware of the COPD/ HF zones/ tool and has minimal recall of ways to manage COPD/ HF at home. He reports having weighing scale but not regularly monitoring his weight.  Explained to patient about [facility] care management services available for him. He verbally agreed and opted for referral  to [facility] care management for further in home evaluation and assessment of COPD/ HF.  Referral made to [place] care management coordinator for follow-up of Pneumonia after discharge and assistance in managing COPD/ HF at home.  [facility] CM information provided for future needs that may arise.   For questions, please contact:  Dannielle Huh, BSN, RN- [facility] Primary Care Navigator  Telephone: [phone] [place]

## 2016-12-21 NOTE — Progress Notes (Signed)
Patient refused CPAP. States he does not wish to use during hospital stay. Equipment removed from room. Encouraged patient to call for Respiratory if he wishes to use CPAP.

## 2016-12-21 NOTE — Progress Notes (Signed)
Physical Therapy Treatment Patient Details Name: Chad Hogan MRN: [id number] DOB: 1940-05-30 Today's Date: 12/21/2016    History of Present Illness Patient is a 76 yo male presenting to the ED (12/16/16) with complaints of shortness of breath.  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 Patient is on droplet precautions possibly due to suspicion of pneumonia as evidenced by chest X-ray revealing exacerbation of lung tissue.    PT Comments    Pt initially agreeable to session, transfers EOB with mod I.  O2 saturations on 15L while sitting EOB for voiding range 87-91%.  Discussed pursed lip breathing.  After voiding, pt reports he doesn't feel up for ambulation this AM.  Remains sitting EOB with call bell in reach.  Pt may benefit from cardiopulmonary rehab when medically ready.    Follow Up Recommendations  No PT follow up     Equipment Recommendations  None recommended by PT    Recommendations for Other Services       Precautions / Restrictions      Mobility  Bed Mobility Overal bed mobility: Modified Independent             General bed mobility comments: HOB elevated, uses bed rails  Transfers                 General transfer comment: pt declined out of bed mobility once EOB, states he doesn't feel well  Ambulation/Gait                 Stairs            Wheelchair Mobility    Modified Rankin (Stroke Patients Only)       Balance                                            Cognition Arousal/Alertness: Awake/alert Behavior During Therapy: Flat affect Overall Cognitive Status: Within Functional Limits for tasks assessed                                        Exercises      General Comments        Pertinent Vitals/Pain Pain Assessment: No/denies pain    Home Living                       Prior Function            PT Goals (current goals can now be found in the care plan section) Acute Rehab PT Goals PT Goal Formulation: With patient Time For Goal Achievement: 12/31/16 Potential to Achieve Goals: Good Progress towards PT goals: Progressing toward goals    Frequency    Min 3X/week      PT Plan Current plan remains appropriate    Co-evaluation              AM-PAC PT "6 Clicks" Daily Activity  Outcome Measure                   End of Session         PT Visit Diagnosis: Difficulty in walking, not elsewhere classified (R26.2)     Time: 8101-7510 PT Time Calculation (min) (ACUTE  ONLY): 11 min  Charges:  $Therapeutic Activity: 8-22 mins                    G Codes:          Michel Santee 12/21/2016, 9:47 AM

## 2016-12-21 NOTE — Progress Notes (Signed)
Occupational Therapy Treatment Patient Details Name: Chad Hogan MRN: [id number] DOB: [birth year] Today's Date: 12/21/2016    History of present illness Patient is a 76 yo male presenting to the ED (12/16/16) with complaints of shortness of breath.  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 Patient is on droplet precautions possibly due to suspicion of pneumonia as evidenced by chest X-ray revealing exacerbation of lung tissue.   OT comments  Pt progressing towards established OT goals. Pt continues to demonstrate decreased activity tolerance and endurance as seen by decreased O2 saturation (76-88% on 12L) and SOB during LB ADLs. Provided pt with education and handout on energy conservation and AE. Pt doffed/donned socks at EOB with AE and education VCs for sequencing. Educated pt on purse lip breathing to bring SpO2 to 88-92%. Will continues to follow acutely to facilitate safe dc. Continues to recommend dc home once medically stable.     Follow Up Recommendations  No OT follow up    Equipment Recommendations  None recommended by OT    Recommendations for Other Services      Precautions / Restrictions Precautions Precautions: None Precaution Comments: watch 02 sats Restrictions Weight Bearing Restrictions: No       Mobility Bed Mobility               General bed mobility comments: Pt at EOB upon arrival  Transfers                 General transfer comment: pt declined out of bed mobility at EOB stating he was fatigued    Balance Overall balance assessment: Needs assistance Sitting-balance support: No upper extremity supported;Feet supported Sitting balance-Leahy Scale: Good Sitting balance - Comments: Able to perform LB dressing with AE at EOB without LOB                                   ADL either performed or assessed with clinical judgement    ADL Overall ADL's : Needs assistance/impaired               Lower Body Bathing Details (indicate cue type and reason): Educated pt on AE for increase EC during LB bathing. Pt reports that he has a long handles sponge at home     Lower Body Dressing: Sit to/from stand;With adaptive equipment;Min guard Lower Body Dressing Details (indicate cue type and reason): Educated pt on AE for LB ADLs including reacher, sock aide, and shoe horn. Pt doffed/donned socks with AE during education VCs for proper sequencing. Feel pt would benefit from AE and educated him on benefits.                General ADL Comments: Pt with SpO2 ~76-88 on 12L O2.  Provided energy conservation handout and reviewed with pt. Pt verbalizing understanding.     Vision       Perception     Praxis      Cognition Arousal/Alertness: Awake/alert Behavior During Therapy: Flat affect Overall Cognitive Status: Within Functional Limits for tasks assessed                                          Exercises     Shoulder Instructions  General Comments Pt reporting that he has "hot spots" on his back. skin check did not show any red spots or abornamilites on his back. Notified RN.    Pertinent Vitals/ Pain       Pain Assessment: No/denies pain  Home Living                                          Prior Functioning/Environment              Frequency  Min 2X/week        Progress Toward Goals  OT Goals(current goals can now be found in the care plan section)  Progress towards OT goals: Progressing toward goals  Acute Rehab OT Goals Patient Stated Goal: Patient wants to go home and return to completing all ADL's indep. OT Goal Formulation: With patient Time For Goal Achievement: 12/26/16 Potential to Achieve Goals: Good ADL Goals Pt Will Perform Grooming: with modified independence;standing Pt Will Perform Lower Body Bathing: with modified  independence;with adaptive equipment;sit to/from stand Pt Will Perform Lower Body Dressing: with modified independence;with adaptive equipment;sit to/from stand Pt Will Transfer to Toilet: with modified independence;ambulating;regular height toilet Pt Will Perform Toileting - Clothing Manipulation and hygiene: with modified independence;sit to/from stand Additional ADL Goal #1: Pt will generalize energy conservation strategies in ADL and mobility independently.  Plan Discharge plan remains appropriate    Co-evaluation                 AM-PAC PT "6 Clicks" Daily Activity     Outcome Measure   Help from another person eating meals?: None Help from another person taking care of personal grooming?: A Little Help from another person toileting, which includes using toliet, bedpan, or urinal?: A Little Help from another person bathing (including washing, rinsing, drying)?: A Little Help from another person to put on and taking off regular upper body clothing?: None Help from another person to put on and taking off regular lower body clothing?: A Little 6 Click Score: 20    End of Session Equipment Utilized During Treatment: Oxygen  OT Visit Diagnosis: Unsteadiness on feet (R26.81)   Activity Tolerance Patient limited by fatigue   Patient Left in bed;with call bell/phone within reach   Nurse Communication Other (comment) (Hot spots on back)        Time: 4193-7902 OT Time Calculation (min): 24 min  Charges: OT General Charges $OT Visit: 1 Visit OT Treatments $Self Care/Home Management : 23-37 mins  [place], OTR/L Acute Rehab Pager: [phone] Office: [place] 12/21/2016, 9:59 AM

## 2016-12-21 NOTE — Progress Notes (Signed)
[facility] TEAM 1 - Stepdown/ICU TEAM  Chad Hogan  MHD:[id number] DOB: Aug 15, 1940 DOA: 12/16/2016 PCP: Myer Peer, MD    Brief Narrative:  76 y.o.M Hx Anxiety, HTN, Ischemic Heart Disease, SVT secondary to AV re-entrant tachycardia, NSVT, Wolff-Parkinson-White syndrome, COPD (on 3 L O2 at home), HLD, and OSA on CPAP who presented with 1 week of cough and shortness of breath.  EMS reported he was satting 53% on RA initially.   During ER stay his CXR was suggestive of PNA, and he was noted to have a run of VT.   Subjective: Pt reports poor appetite.  He denies cp, n/v, or abdom pain.  He states his sob is slowly improving.    Assessment & Plan:  Acute hypoxic respiratory failure and sepsis due to multifocal pneumonia and acute COPD exacerbation Continues to require high-level oxygen support - wean as able - cont empiric abx but stop vanc w/ negative MRSA screen - no wheezing on exam today   Acute renal failure Baseline creatinine reportedly 1.1 - creatinine appears to be slowly improving  Recent Labs Lab 12/17/16 0752 12/18/16 0429 12/19/16 0258 12/20/16 0239 12/21/16 0142  CREATININE 1.17 1.16 1.25* 1.26* 1.22     Chronic diastolic congestive heart failure No gross volume overload on exam - follow Is/Os and daily weights - wgt consistently trending down since admit  Filed Weights   12/19/16 0500 12/20/16 0341 12/21/16 0454  Weight: 126.8 kg (279 lb 9.6 oz) 124 kg (273 lb 4.8 oz) 123.1 kg (271 lb 6.2 oz)    Wolff-Parkinson-White syndrome  4.2 cm ascending thoracic aortic aneurysm Will need annual monitoring   Hypokalemia Due to diuretic w/ poor intake - supplement and follow  HTN Blood pressure not at goal - adjust medical therapy and follow  OSA  Obesity - Body mass index is 37.85 kg/m.   DVT prophylaxis: Lovenox Code Status: FULL CODE Family Communication: no family present at time of exam  Disposition Plan:   Consultants:  None  Procedures: 9/9  TTE EF 60-65% - grade 1 diastolic dysfunction  Antimicrobials:  Zosyn 9/5 > Vancomycin 9/5 > 9/10  Objective: Blood pressure (!) 150/76, pulse 71, temperature 98.2 F (36.8 C), temperature source Oral, resp. rate 15, height 5\' 11"  (1.803 m), weight 123.1 kg (271 lb 6.2 oz), SpO2 94 %.  Intake/Output Summary (Last 24 hours) at 12/21/16 1548 Last data filed at 12/21/16 1300  Gross per 24 hour  Intake                0 ml  Output             1175 ml  Net            -1175 ml   Filed Weights   12/19/16 0500 12/20/16 0341 12/21/16 0454  Weight: 126.8 kg (279 lb 9.6 oz) 124 kg (273 lb 4.8 oz) 123.1 kg (271 lb 6.2 oz)    Examination: General: No acute respiratory distress at rest but requiring high flow O2 Lungs: poor air movement th/o - no wheezing - bibasilar crackles  Cardiovascular: Regular rate and rhythm without murmur - distant HS  Abdomen: Nontender, obese, soft, bowel sounds positive, no rebound, no ascites, no appreciable mass Extremities: trace B LE edema   CBC:  Recent Labs Lab 12/16/16 1619  12/17/16 0752 12/18/16 0429 12/19/16 0258 12/20/16 0239 12/21/16 0142  WBC 12.1*  --  13.5* 14.4* 15.6* 12.9* 13.4*  NEUTROABS 9.7*  --   --   --   --   --   --  HGB 15.2  < > 14.7 13.9 16.0 14.7 14.4  HCT 47.9  < > 46.5 43.6 50.4 46.2 45.5  MCV 92.6  --  93.9 93.6 93.2 92.4 92.7  PLT 218  --  196 197 213 174 182  < > = values in this interval not displayed. Basic Metabolic Panel:  Recent Labs Lab 12/16/16 2356 12/17/16 0752 12/18/16 0429 12/19/16 0258 12/20/16 0239 12/21/16 0142  NA  --  138 135 138 136 138  K  --  4.1 3.9 4.0 3.6 3.4*  CL  --  100* 100* 99* 93* 93*  CO2  --  27 29 29  33* 36*  GLUCOSE  --  142* 124* 129* 149* 166*  BUN  --  35* 42* 30* 28* 23*  CREATININE  --  1.17 1.16 1.25* 1.26* 1.22  CALCIUM  --  8.0* 8.1* 8.4* 7.9* 8.0*  MG 2.7* 2.9* 2.9* 2.3 1.9 2.1  PHOS 3.3 3.7  --   --   --   --    GFR: Estimated Creatinine Clearance: 67.7 mL/min  (by C-G formula based on SCr of 1.22 mg/dL).  Liver Function Tests:  Recent Labs Lab 12/16/16 1619 12/17/16 0752  AST 39 38  ALT 25 29  ALKPHOS 131* 128*  BILITOT 0.9 0.7  PROT 6.3* 7.6  ALBUMIN 2.6* 2.5*    Coagulation Profile:  Recent Labs Lab 12/16/16 2356  INR 1.18    Cardiac Enzymes:  Recent Labs Lab 12/16/16 2356 12/17/16 0752  TROPONINI <0.03 0.05*     Recent Results (from the past 240 hour(s))  Culture, blood (Routine X 2) w Reflex to ID Panel     Status: None (Preliminary result)   Collection Time: 12/16/16  4:19 PM  Result Value Ref Range Status   Specimen Description BLOOD LEFT WRIST  Final   Special Requests   Final    BOTTLES DRAWN AEROBIC AND ANAEROBIC Blood Culture adequate volume   Culture NO GROWTH 4 DAYS  Final   Report Status PENDING  Incomplete  Culture, blood (Routine X 2) w Reflex to ID Panel     Status: None (Preliminary result)   Collection Time: 12/16/16  5:00 PM  Result Value Ref Range Status   Specimen Description BLOOD LEFT HAND  Final   Special Requests   Final    BOTTLES DRAWN AEROBIC AND ANAEROBIC Blood Culture adequate volume   Culture NO GROWTH 4 DAYS  Final   Report Status PENDING  Incomplete  MRSA PCR Screening     Status: None   Collection Time: 12/16/16 11:34 PM  Result Value Ref Range Status   MRSA by PCR NEGATIVE NEGATIVE Final    Comment:        The GeneXpert MRSA Assay (FDA approved for NASAL specimens only), is one component of a comprehensive MRSA colonization surveillance program. It is not intended to diagnose MRSA infection nor to guide or monitor treatment for MRSA infections.   Respiratory Panel by PCR     Status: None   Collection Time: 12/16/16 11:34 PM  Result Value Ref Range Status   Adenovirus NOT DETECTED NOT DETECTED Final   Coronavirus 229E NOT DETECTED NOT DETECTED Final   Coronavirus HKU1 NOT DETECTED NOT DETECTED Final   Coronavirus NL63 NOT DETECTED NOT DETECTED Final   Coronavirus  OC43 NOT DETECTED NOT DETECTED Final   Metapneumovirus NOT DETECTED NOT DETECTED Final   Rhinovirus / Enterovirus NOT DETECTED NOT DETECTED Final   Influenza A NOT DETECTED NOT  DETECTED Final   Influenza B NOT DETECTED NOT DETECTED Final   Parainfluenza Virus 1 NOT DETECTED NOT DETECTED Final   Parainfluenza Virus 2 NOT DETECTED NOT DETECTED Final   Parainfluenza Virus 3 NOT DETECTED NOT DETECTED Final   Parainfluenza Virus 4 NOT DETECTED NOT DETECTED Final   Respiratory Syncytial Virus NOT DETECTED NOT DETECTED Final   Bordetella pertussis NOT DETECTED NOT DETECTED Final   Chlamydophila pneumoniae NOT DETECTED NOT DETECTED Final   Mycoplasma pneumoniae NOT DETECTED NOT DETECTED Final  Culture, sputum-assessment     Status: None   Collection Time: 12/16/16 11:57 PM  Result Value Ref Range Status   Specimen Description Expect. Sput  Final   Special Requests Normal  Final   Sputum evaluation THIS SPECIMEN IS ACCEPTABLE FOR SPUTUM CULTURE  Final   Report Status 12/17/2016 FINAL  Final  Culture, respiratory (NON-Expectorated)     Status: None   Collection Time: 12/16/16 11:57 PM  Result Value Ref Range Status   Specimen Description Expect. Sput  Final   Special Requests Normal Reflexed from [id number]  Final   Gram Stain   Final    ABUNDANT WBC PRESENT, PREDOMINANTLY PMN FEW SQUAMOUS EPITHELIAL CELLS PRESENT MODERATE GRAM NEGATIVE RODS FEW GRAM POSITIVE COCCI RARE GRAM POSITIVE RODS    Culture Consistent with normal respiratory flora.  Final   Report Status 12/19/2016 FINAL  Final     Scheduled Meds: . aspirin EC  81 mg Oral Daily  . carvedilol  12.5 mg Oral BID WC  . dextromethorphan-guaiFENesin  1 tablet Oral BID  . enoxaparin (LOVENOX) injection  40 mg Subcutaneous Daily  . furosemide  80 mg Intravenous Daily  . ipratropium-albuterol  3 mL Nebulization QID  . mometasone-formoterol  2 puff Inhalation BID  . pantoprazole  40 mg Oral Daily  . propafenone  300 mg Oral BID  .  rosuvastatin  10 mg Oral QHS  . tetrahydrozoline  1 drop Both Eyes Daily     LOS: 5 days   Cherene Altes, MD [facility] Office  [phone] Pager - Text Page per Amion as per below:  On-Call/Text Page:      [email or web address]      password [email or web address]  If 7PM-7AM, please contact night-coverage [email or web address] Password [facility] 12/21/2016, 3:48 PM

## 2016-12-21 NOTE — Care Management Important Message (Signed)
Important Message  Patient Details  Name: Chad Hogan MRN: [id number] Date of Birth: [birth year]   Medicare Important Message Given:  Yes    Nathen May 12/21/2016, 9:52 AM

## 2016-12-22 DIAGNOSIS — I712 Thoracic aortic aneurysm, without rupture: Secondary | ICD-10-CM

## 2016-12-22 DIAGNOSIS — J441 Chronic obstructive pulmonary disease with (acute) exacerbation: Secondary | ICD-10-CM | POA: Diagnosis present

## 2016-12-22 LAB — BASIC METABOLIC PANEL
ANION GAP: 6 (ref 5–15)
BUN: 21 mg/dL — AB (ref 6–20)
CALCIUM: 7.9 mg/dL — AB (ref 8.9–10.3)
CO2: 38 mmol/L — AB (ref 22–32)
CREATININE: 1.3 mg/dL — AB (ref 0.61–1.24)
Chloride: 93 mmol/L — ABNORMAL LOW (ref 101–111)
GFR calc Af Amer: 59 mL/min — ABNORMAL LOW (ref >60)
GFR, EST NON AFRICAN AMERICAN: 51 mL/min — AB (ref >60)
GLUCOSE: 105 mg/dL — AB (ref 65–99)
Potassium: 3.1 mmol/L — ABNORMAL LOW (ref 3.5–5.1)
Sodium: 137 mmol/L (ref 135–145)

## 2016-12-22 LAB — CBC
HCT: 46.4 % (ref 39.0–52.0)
Hemoglobin: 15 g/dL (ref 13.0–17.0)
MCH: 29.9 pg (ref 26.0–34.0)
MCHC: 32.3 g/dL (ref 30.0–36.0)
MCV: 92.4 fL (ref 78.0–100.0)
PLATELETS: 163 10*3/uL (ref 150–400)
RBC: 5.02 MIL/uL (ref 4.22–5.81)
RDW: 13.7 % (ref 11.5–15.5)
WBC: 13 10*3/uL — ABNORMAL HIGH (ref 4.0–10.5)

## 2016-12-22 LAB — POTASSIUM: POTASSIUM: 3.9 mmol/L (ref 3.5–5.1)

## 2016-12-22 LAB — MAGNESIUM: MAGNESIUM: 2 mg/dL (ref 1.7–2.4)

## 2016-12-22 MED ORDER — ALBUTEROL SULFATE (2.5 MG/3ML) 0.083% IN NEBU: 2.5000 mg | INHALATION_SOLUTION | RESPIRATORY_TRACT | Status: DC | PRN

## 2016-12-22 MED ORDER — ENOXAPARIN SODIUM 60 MG/0.6ML ~~LOC~~ SOLN
60.0000 mg | Freq: Every day | SUBCUTANEOUS | Status: DC
  Administered 2016-12-23 – 2016-12-31 (×8): 60 mg via SUBCUTANEOUS
  Filled 2016-12-22 (×9): qty 0.6

## 2016-12-22 MED ORDER — IPRATROPIUM-ALBUTEROL 0.5-2.5 (3) MG/3ML IN SOLN
3.0000 mL | Freq: Three times a day (TID) | RESPIRATORY_TRACT | Status: DC
  Administered 2016-12-22 – 2016-12-28 (×17): 3 mL via RESPIRATORY_TRACT
  Filled 2016-12-22 (×17): qty 3

## 2016-12-22 MED ORDER — POTASSIUM CHLORIDE CRYS ER 20 MEQ PO TBCR
60.0000 meq | EXTENDED_RELEASE_TABLET | Freq: Once | ORAL | Status: AC
  Administered 2016-12-22: 60 meq via ORAL
  Filled 2016-12-22: qty 3

## 2016-12-22 NOTE — Progress Notes (Signed)
Occupational Therapy Treatment Patient Details Name: Chad Hogan MRN: [id number] DOB: 1940/12/24 Today's Date: 12/22/2016    History of present illness Patient is a 76 yo male presenting to the ED (12/16/16) with complaints of shortness of breath.  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 Patient is on droplet precautions possibly due to suspicion of pneumonia as evidenced by chest X-ray revealing exacerbation of lung tissue.   OT comments  Pt declining ADLs at sink but willing to complete them sitting EOB.  Stating today "I've been going since 6am and just finished walking, I need a break."  Therapist dicussed importance of building up his activity tolerance with participation in ADLs, both standing and sitting. Reviewed AE education from previous session.  Will benefit from continued acute OT services to improve independence and safety with ADLs prior to return home.   Follow Up Recommendations  No OT follow up    Equipment Recommendations  None recommended by OT    Recommendations for Other Services      Precautions / Restrictions Precautions Precautions: None Precaution Comments: watch 02 sats Restrictions Weight Bearing Restrictions: No       Mobility Bed Mobility Overal bed mobility: Modified Independent                Transfers                      Balance                                           ADL either performed or assessed with clinical judgement   ADL Overall ADL's : Needs assistance/impaired     Grooming: Wash/dry hands;Wash/dry face;Oral care;Sitting                                 General ADL Comments: Pt with SpOT 90 on 14L O2. He had just finished ambulating in hallway so was resistant to standing at sink today for grooming.  Therapist reviewed AE education from last session and patient was able to  teach back donning/doffing socks with AE.      Vision       Perception     Praxis      Cognition Arousal/Alertness: Awake/alert Behavior During Therapy: Flat affect Overall Cognitive Status: Within Functional Limits for tasks assessed                                          Exercises     Shoulder Instructions       General Comments      Pertinent Vitals/ Pain       Pain Assessment: No/denies pain  Home Living                                          Prior Functioning/Environment              Frequency  Min 2X/week        Progress Toward Goals  OT Goals(current goals can now be  found in the care plan section)  Progress towards OT goals: Progressing toward goals  Acute Rehab OT Goals Patient Stated Goal: Patient wants to go home and return to completing all ADL's indep. OT Goal Formulation: With patient Time For Goal Achievement: 12/26/16 Potential to Achieve Goals: Good ADL Goals Pt Will Perform Grooming: with modified independence;standing Pt Will Perform Lower Body Bathing: with modified independence;with adaptive equipment;sit to/from stand Pt Will Perform Lower Body Dressing: with modified independence;with adaptive equipment;sit to/from stand Pt Will Transfer to Toilet: with modified independence;ambulating;regular height toilet Pt Will Perform Toileting - Clothing Manipulation and hygiene: with modified independence;sit to/from stand Additional ADL Goal #1: Pt will generalize energy conservation strategies in ADL and mobility independently.  Plan Discharge plan remains appropriate    Co-evaluation                 AM-PAC PT "6 Clicks" Daily Activity     Outcome Measure   Help from another person eating meals?: None Help from another person taking care of personal grooming?: A Little Help from another person toileting, which includes using toliet, bedpan, or urinal?: A Little Help from another  person bathing (including washing, rinsing, drying)?: A Little Help from another person to put on and taking off regular upper body clothing?: None Help from another person to put on and taking off regular lower body clothing?: A Little 6 Click Score: 20    End of Session Equipment Utilized During Treatment: Oxygen  OT Visit Diagnosis: Unsteadiness on feet (R26.81)   Activity Tolerance Patient limited by fatigue   Patient Left in bed;with call bell/phone within reach;with nursing/sitter in room   Nurse Communication          Time: 2774-1287 OT Time Calculation (min): 14 min  Charges: OT General Charges $OT Visit: 1 Visit OT Treatments $Self Care/Home Management : 8-22 mins    Darrol Jump OTR/L 12/22/2016, 10:07 AM

## 2016-12-22 NOTE — Care Management Note (Addendum)
Case Management Note  Patient Details  Name: Chad Hogan MRN: [id number] Date of Birth: 05/07/1940  Subjective/Objective: Pt presented for Cough and SOB- COPD exacerbation. Pt initiated on IV Solumedrol and changed to po prednisone. Pt is currently on IV Lasix for CHF. Treating for PNA- continues on IV Zosyn. PTA pt was from home alone in [facility] Pt states he has DME 02 and CPAP via [employer]. Currently on 02 continuous @ 3 L that bleeds into CPAP qhs. CM did call Zenovia Jarred with [facility] [facility] to clarify. [facility] has a concentrator that goes to 10 L or pt can use the Oxymizer for home use.  CM will continue to monitor for 02 needs.                  Action/Plan: Pt will Benefit from [facility] RN for disease/ medication management and [facility] Aide for bath. CM did provide pt with and agency list. CM will continue to monitor for additional needs.   Expected Discharge Date:                  Expected Discharge Plan:  [place]  In-House Referral:   N/A  Discharge planning Services  CM Consult  Post Acute Care Choice:  Home Health Choice offered to:  Patient  DME Arranged:   Oxygen- Previous with [facility]- will order oxymizer.  DME Agency:   [facility]  HH Arranged:  RN, Nurse's Aide, PT [facility] Agency:    Alvis Lemmings  Status of Service: Completed  If discussed at [place] of Stay Meetings, dates discussed:  12-22-16, 12-29-16, 12-31-16  Additional Comments:  1106 12-31-16 Jacqlyn Krauss, RN,BSN [phone] Pt was discussed in the [facility]- Pt feels dizzy this am. MD to assess- pt is set up with [facility] and 02 has been delivered with Oxymizer. CM will continue to monitor for additional needs.    1742 12-30-16 Jacqlyn Krauss, RN,BSN [phone] Pt's home was affected in the [facility] no phone,-Pt has Power for 02. CM did confirm with daughter Patina. Pt has Oxymizer and 0 2 tank for travel home. Home Health Agency alerted that pt will d/c tonight. No further needs from CM at this time.      1459 12-29-16 Jacqlyn Krauss, RN,BSN [phone] CM did speak with pt in regards to disposition needs. Pt is agreeable to SNF. CSW is aware and assisting with disposition needs.    1111 12-28-16 Jacqlyn Krauss, RN,BSN [phone]  CM did offer choice for [facility] RN/ Aide Services- Pt is agreeable to services. CM provided pt with an agency list. Pt chose [facility]- Referral made to [facility] to begin within 24-48 hours post d/c. CM called Ronnell Gu[place] with [facility] and pt currently has home orders for 3 L. CM wanted to see if home concentrator will be able to manage 5L High Flow - Pt will need new orders for Oxymizer High Flow  at 5 Liters. CM will fax the order to [phone]. CM did reach out to MD. No further needs from CM at this time.   Bethena Roys, RN 12/22/2016, 2:05 PM

## 2016-12-22 NOTE — Progress Notes (Signed)
Physical Therapy Treatment Patient Details Name: Chad Hogan MRN: [id number] DOB: 1941/03/22 Today's Date: 12/22/2016    History of Present Illness Patient is a 76 yo male admitted 9/5 with SOB and PNA. 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     PT Comments    Pt pleasant and seated EOB on arrival. Pt able to tolerate increased activity but SpO2 85-92% on 15L HFNC with pt asymptomatic and needing frequent cues to attend to monitor as well as take rest breaks. Pt with grossly 6 standing rest stops during gait of 1-3 min each to recover sats. Pt educated for HEP and encouraged mobility with assist. Pt unable to tolerate further exercise due to need to void. Will continue to follow.    Follow Up Recommendations  No PT follow up     Equipment Recommendations  None recommended by PT    Recommendations for Other Services       Precautions / Restrictions Precautions Precautions: Other (comment) Precaution Comments: watch 02 sats    Mobility  Bed Mobility               General bed mobility comments: Pt at EOB upon arrival  Transfers Overall transfer level: Modified independent                  Ambulation/Gait Ambulation/Gait assistance: Supervision Ambulation Distance (Feet): 240 Feet Assistive device: None Gait Pattern/deviations: Step-through pattern;Decreased stride length   Gait velocity interpretation: Below normal speed for age/gender General Gait Details: slow gait with pt continuing to prefer use of IV pole for support with gait. pt with frequent standing rest breaks grossly every 30' to recover SpO2. Pt educated for the importance of self-regulation and awareness of SpO2   Stairs Stairs:  (pt declined attempting today)          Wheelchair Mobility    Modified Rankin (Stroke Patients Only)       Balance Overall balance assessment: Needs  assistance   Sitting balance-Leahy Scale: Normal       Standing balance-Leahy Scale: Good                              Cognition Arousal/Alertness: Awake/alert Behavior During Therapy: Flat affect Overall Cognitive Status: Within Functional Limits for tasks assessed                                 General Comments: Patient needed reinforcement on the importance of paying attention to O2 reading despite a lack of symptoms.      Exercises General Exercises - Lower Extremity Long Arc Quad: AROM;Both;Seated;15 reps    General Comments        Pertinent Vitals/Pain Pain Assessment: No/denies pain    Home Living                      Prior Function            PT Goals (current goals can now be found in the care plan section) Progress towards PT goals: Progressing toward goals    Frequency           PT Plan Current plan remains appropriate    Co-evaluation              AM-PAC PT "6 Clicks"  Daily Activity  Outcome Measure  Difficulty turning over in bed (including adjusting bedclothes, sheets and blankets)?: None Difficulty moving from lying on back to sitting on the side of the bed? : A Little Difficulty sitting down on and standing up from a chair with arms (e.g., wheelchair, bedside commode, etc,.)?: None Help needed moving to and from a bed to chair (including a wheelchair)?: None Help needed walking in hospital room?: A Little Help needed climbing 3-5 steps with a railing? : A Little 6 Click Score: 21    End of Session Equipment Utilized During Treatment: Oxygen Activity Tolerance: Patient tolerated treatment well Patient left: in bed;with call bell/phone within reach   PT Visit Diagnosis: Difficulty in walking, not elsewhere classified (R26.2)     Time: 9038-3338 PT Time Calculation (min) (ACUTE ONLY): 23 min  Charges:  $Gait Training: 23-37 mins                    G Codes:       Elwyn Reach,  [place] 12/22/2016, 2:02 PM

## 2016-12-22 NOTE — Progress Notes (Signed)
Patient refused CPAP at this time. States he does not wish to use during his hospital stay.

## 2016-12-22 NOTE — Progress Notes (Signed)
PROGRESS NOTE    Chad Hogan  WOE:[id number] DOB: [birth year] DOA: 12/16/2016 PCP: Myer Peer, MD   Brief Narrative:  76 y.o. WM PMHx Anxiety, HTN, Ischemic Heart Disease, SVT secondary to antidromic AV reentrant tachycardia, NSVT, Wolff-Parkinson-White syndrome, COPD (on 3 L O2 at home), HLD, OSA on CPAP,   Presented with cough and shortness of breath this been going on for past 1 week patient today called  EMS with me Atrovent 125 of Solu-Medrol on EMS report initially satting 53% in room air but improved to 92% nebulizer treatment. He reports sputum productive of green sputum wheezing, no sick contacts, subjective fever and chills, no chest pain Patient required up to 5 L of oxygen currently satting 87% chest x-ray worrisome for pneumonia. During ER stay patient was noted to have a run of VT. hemodynamically stable currently resolved   Regarding pertinent Chronic problems: Hx of SVT secondary to antidromic AV reentrant tachycardia sp unsuccessful ablation, responded well to propafenone,     Subjective: 9/11 A/O 4, negative CP, positive acute on chronic SOB, negative abdominal pain, negative N/V. Patient states ambulated on ward today, negative DOE.       Assessment & Plan:   Active Problems:   Essential hypertension   WOLFF (WOLFE)-PARKINSON-WHITE (WPW) SYNDROME   Sleep apnea   Chronic diastolic heart failure (HCC)   CAP (community acquired pneumonia)   Dehydration   CKD (chronic kidney disease), stage III   Acute on chronic respiratory failure with hypoxia (HCC)   NSVT (nonsustained ventricular tachycardia) (HCC)   Sepsis (HCC)   Hypocalcemia     COPD Exacerbation/Multifocal Pneumonia/Sepsis CAP   -Complete seven-day course antibiotics -Mucinex DM BID -Flutter valve -DuoNeb QID -Dulera BID -Titrate O2 to maintain SPO2 89-93%  Acute on chronic respiratory failure with hypoxia - 3 L O2 via [place] at home -See COPD exacerbation -Echocardiogram: Consistent with  diastolic CHF see results below  -CT chest PE protocol: Consistent with multifocal pneumonia, negative PE   Acute renal failure (baseline Cr 1.1)  Lab Results  Component Value Date   CREATININE 1.30 (H) 12/22/2016   CREATININE 1.22 12/21/2016   CREATININE 1.26 (H) 12/20/2016  -If patient's Cr continues to trend up in A.m. decrease Lasix   Chronic diastolic CHF (unknown base weight) -Strict I&OSince admission -4.4 L -Daily weight Filed Weights   12/20/16 0341 12/21/16 0454 12/22/16 0556  Weight: 273 lb 4.8 oz (124 kg) 271 lb 6.2 oz (123.1 kg) 269 lb 6.4 oz (122.2 kg)  -Coreg 12.5 mg BID -Lasix 80 mg daily.  Wolff-Parkinson-White syndrome -Currently in NSR -Propafenone 300 mg BID  Essential hypertension -See CHF  Ascending thoracic aortic aneurysm -9/9 CT abdomen/pelvis: Shows 4.2 cm aneurysm see results below -Requires annual imaging  Hypocalcemia  Hypokalemia -Potassium goal> 4 -K-Dur 60 mEq   OSA -CPAP per respiratory     Goals of care -Consulted on 9/5 PT/OT: No follow-up recommended. Disagree with assessment patient extremely deconditioned, when closer to discharge would have reevaluated     DVT prophylaxis: Lovenox Code Status: Full Family Communication: None Disposition Plan: TBD   Consultants:  None    Procedures/Significant Events:  9/9 CT angiogram chest PE protocol: -Negative PE . -4.2 cm Ascending Thoracic Aortic Aneurysm 4.2 cm ascending thoracic aortic aneurysm.   -Multifocal airspace opacities bilateral lung: Worse bibasilar and RUL pneumonia  vs atelectasis  9/9 Echocardiogram: LVEF= 60-65%.-Grade 1 diastolic dysfunction   I have personally reviewed and interpreted all radiology studies and my findings are  as above.  VENTILATOR SETTINGS: None   Cultures 9/5 blood NGTD 9/5 MRSA by PCR negative 9/5 sputum normal flora 9/5 respiratory virus panel negative 9/5 HIV negative     Antimicrobials:Anti-infectives    Start     Stop     12/17/16 0900  vancomycin (VANCOCIN) IVPB 1000 mg/200 mL premix  Status:  Discontinued     12/21/16 1609   12/17/16 0300  piperacillin-tazobactam (ZOSYN) IVPB 3.375 g         12/16/16 2030  piperacillin-tazobactam (ZOSYN) IVPB 3.375 g     12/16/16 2053   12/16/16 2030  vancomycin (VANCOCIN) 2,500 mg in sodium chloride 0.9 % 500 mL IVPB     12/16/16 2250   12/16/16 1615  cefTRIAXone (ROCEPHIN) 1 g in dextrose 5 % 50 mL IVPB     12/16/16 1809   12/16/16 1615  azithromycin (ZITHROMAX) 500 mg in dextrose 5 % 250 mL IVPB     12/16/16 1923       Devices    LINES / TUBES:      Continuous Infusions: . piperacillin-tazobactam (ZOSYN)  IV 3.375 g (12/22/16 0339)     Objective: Vitals:   12/21/16 2005 12/21/16 2023 12/22/16 0035 12/22/16 0556  BP:  (!) 155/98 120/62 (!) 142/66  Pulse:  98 62 60  Resp:  (!) 26 (!) 22 20  Temp:   97.9 F (36.6 C) 98.5 F (36.9 C)  TempSrc:   Oral Oral  SpO2: 93% 100% 90% 90%  Weight:    269 lb 6.4 oz (122.2 kg)  Height:        Intake/Output Summary (Last 24 hours) at 12/22/16 0731 Last data filed at 12/22/16 1914  Gross per 24 hour  Intake              100 ml  Output             1250 ml  Net            -1150 ml   Filed Weights   12/20/16 0341 12/21/16 0454 12/22/16 0556  Weight: 273 lb 4.8 oz (124 kg) 271 lb 6.2 oz (123.1 kg) 269 lb 6.4 oz (122.2 kg)   Physical Exam:  General: A/O 4, positive acute on chronic respiratory failure (patient feels improved ambulated on ward)  Neck:  Negative scars, masses, torticollis, lymphadenopathy, JVD Lungs: Clear to auscultation bilaterally, bibasilar crackles Rt>>Lt Cardiovascular: Regular rate and rhythm without murmur gallop or rub normal S1 and S2 Abdomen: Morbidly obese, negative abdominal pain, nondistended, positive soft, bowel sounds, no rebound, no ascites, no appreciable mass Extremities: No significant cyanosis, clubbing. +1 bilateral pedal edema, kin: Negative rashes, lesions,  ulcers Psychiatric:  Negative depression, negative anxiety, negative fatigue, negative mania  Central nervous system:  Cranial nerves II through XII intact, tongue/uvula midline, all extremities muscle strength 5/5, sensation intact throughout,  negative dysarthria, negative expressive aphasia, negative receptive aphasia.    .     Data Reviewed: Care during the described time interval was provided by me .  I have reviewed this patient's available data, including medical history, events of note, physical examination, and all test results as part of my evaluation.   CBC:  Recent Labs Lab 12/16/16 1619  12/18/16 0429 12/19/16 0258 12/20/16 0239 12/21/16 0142 12/22/16 0336  WBC 12.1*  < > 14.4* 15.6* 12.9* 13.4* 13.0*  NEUTROABS 9.7*  --   --   --   --   --   --   HGB  15.2  < > 13.9 16.0 14.7 14.4 15.0  HCT 47.9  < > 43.6 50.4 46.2 45.5 46.4  MCV 92.6  < > 93.6 93.2 92.4 92.7 92.4  PLT 218  < > 197 213 174 182 163  < > = values in this interval not displayed. Basic Metabolic Panel:  Recent Labs Lab 12/16/16 2356 12/17/16 0752 12/18/16 0429 12/19/16 0258 12/20/16 0239 12/21/16 0142 12/22/16 0336  NA  --  138 135 138 136 138 137  K  --  4.1 3.9 4.0 3.6 3.4* 3.1*  CL  --  100* 100* 99* 93* 93* 93*  CO2  --  27 29 29  33* 36* 38*  GLUCOSE  --  142* 124* 129* 149* 166* 105*  BUN  --  35* 42* 30* 28* 23* 21*  CREATININE  --  1.17 1.16 1.25* 1.26* 1.22 1.30*  CALCIUM  --  8.0* 8.1* 8.4* 7.9* 8.0* 7.9*  MG 2.7* 2.9* 2.9* 2.3 1.9 2.1  --   PHOS 3.3 3.7  --   --   --   --   --    GFR: Estimated Creatinine Clearance: 63.3 mL/min (A) (by C-G formula based on SCr of 1.3 mg/dL (H)). Liver Function Tests:  Recent Labs Lab 12/16/16 1619 12/17/16 0752  AST 39 38  ALT 25 29  ALKPHOS 131* 128*  BILITOT 0.9 0.7  PROT 6.3* 7.6  ALBUMIN 2.6* 2.5*   No results for input(s): LIPASE, AMYLASE in the last 168 hours. No results for input(s): AMMONIA in the last 168  hours. Coagulation Profile:  Recent Labs Lab 12/16/16 2356  INR 1.18   Cardiac Enzymes:  Recent Labs Lab 12/16/16 2356 12/17/16 0752  TROPONINI <0.03 0.05*   BNP (last 3 results) No results for input(s): PROBNP in the last 8760 hours. HbA1C: No results for input(s): HGBA1C in the last 72 hours. CBG: No results for input(s): GLUCAP in the last 168 hours. Lipid Profile: No results for input(s): CHOL, HDL, LDLCALC, TRIG, CHOLHDL, LDLDIRECT in the last 72 hours. Thyroid Function Tests: No results for input(s): TSH, T4TOTAL, FREET4, T3FREE, THYROIDAB in the last 72 hours. Anemia Panel: No results for input(s): VITAMINB12, FOLATE, FERRITIN, TIBC, IRON, RETICCTPCT in the last 72 hours. Urine analysis:    Component Value Date/Time   COLORURINE BROWN (A) 07/27/2016 2049   APPEARANCEUR CLOUDY (A) 07/27/2016 2049   LABSPEC 1.023 07/27/2016 2049   PHURINE 5.0 07/27/2016 2049   GLUCOSEU NEGATIVE 07/27/2016 2049   HGBUR MODERATE (A) 07/27/2016 2049   BILIRUBINUR NEGATIVE 07/27/2016 2049   [place] 07/27/2016 2049   PROTEINUR 100 (A) 07/27/2016 2049   NITRITE NEGATIVE 07/27/2016 2049   LEUKOCYTESUR NEGATIVE 07/27/2016 2049   Sepsis Labs: @LABRCNTIP (procalcitonin:4,lacticidven:4)  ) Recent Results (from the past 240 hour(s))  Culture, blood (Routine X 2) w Reflex to ID Panel     Status: None   Collection Time: 12/16/16  4:19 PM  Result Value Ref Range Status   Specimen Description BLOOD LEFT WRIST  Final   Special Requests   Final    BOTTLES DRAWN AEROBIC AND ANAEROBIC Blood Culture adequate volume   Culture NO GROWTH 5 DAYS  Final   Report Status 12/21/2016 FINAL  Final  Culture, blood (Routine X 2) w Reflex to ID Panel     Status: None   Collection Time: 12/16/16  5:00 PM  Result Value Ref Range Status   Specimen Description BLOOD LEFT HAND  Final   Special Requests   Final  BOTTLES DRAWN AEROBIC AND ANAEROBIC Blood Culture adequate volume   Culture NO  GROWTH 5 DAYS  Final   Report Status 12/21/2016 FINAL  Final  MRSA PCR Screening     Status: None   Collection Time: 12/16/16 11:34 PM  Result Value Ref Range Status   MRSA by PCR NEGATIVE NEGATIVE Final    Comment:        The GeneXpert MRSA Assay (FDA approved for NASAL specimens only), is one component of a comprehensive MRSA colonization surveillance program. It is not intended to diagnose MRSA infection nor to guide or monitor treatment for MRSA infections.   Respiratory Panel by PCR     Status: None   Collection Time: 12/16/16 11:34 PM  Result Value Ref Range Status   Adenovirus NOT DETECTED NOT DETECTED Final   Coronavirus 229E NOT DETECTED NOT DETECTED Final   Coronavirus HKU1 NOT DETECTED NOT DETECTED Final   Coronavirus NL63 NOT DETECTED NOT DETECTED Final   Coronavirus OC43 NOT DETECTED NOT DETECTED Final   Metapneumovirus NOT DETECTED NOT DETECTED Final   Rhinovirus / Enterovirus NOT DETECTED NOT DETECTED Final   Influenza A NOT DETECTED NOT DETECTED Final   Influenza B NOT DETECTED NOT DETECTED Final   Parainfluenza Virus 1 NOT DETECTED NOT DETECTED Final   Parainfluenza Virus 2 NOT DETECTED NOT DETECTED Final   Parainfluenza Virus 3 NOT DETECTED NOT DETECTED Final   Parainfluenza Virus 4 NOT DETECTED NOT DETECTED Final   Respiratory Syncytial Virus NOT DETECTED NOT DETECTED Final   Bordetella pertussis NOT DETECTED NOT DETECTED Final   Chlamydophila pneumoniae NOT DETECTED NOT DETECTED Final   Mycoplasma pneumoniae NOT DETECTED NOT DETECTED Final  Culture, sputum-assessment     Status: None   Collection Time: 12/16/16 11:57 PM  Result Value Ref Range Status   Specimen Description Expect. Sput  Final   Special Requests Normal  Final   Sputum evaluation THIS SPECIMEN IS ACCEPTABLE FOR SPUTUM CULTURE  Final   Report Status 12/17/2016 FINAL  Final  Culture, respiratory (NON-Expectorated)     Status: None   Collection Time: 12/16/16 11:57 PM  Result Value Ref  Range Status   Specimen Description Expect. Sput  Final   Special Requests Normal Reflexed from [id number]  Final   Gram Stain   Final    ABUNDANT WBC PRESENT, PREDOMINANTLY PMN FEW SQUAMOUS EPITHELIAL CELLS PRESENT MODERATE GRAM NEGATIVE RODS FEW GRAM POSITIVE COCCI RARE GRAM POSITIVE RODS    Culture Consistent with normal respiratory flora.  Final   Report Status 12/19/2016 FINAL  Final         Radiology Studies: Ct Angio Chest Pe W Or Wo Contrast  Result Date: 12/20/2016 CLINICAL DATA:  Shortness of breath. EXAM: CT ANGIOGRAPHY CHEST WITH CONTRAST TECHNIQUE: Multidetector CT imaging of the chest was performed using the standard protocol during bolus administration of intravenous contrast. Multiplanar CT image reconstructions and MIPs were obtained to evaluate the vascular anatomy. CONTRAST:  100 mL of Isovue 370 intravenously. COMPARISON:  CT scan of March 13, 2016. FINDINGS: Cardiovascular: Satisfactory opacification of the pulmonary arteries to the segmental level. No evidence of pulmonary embolism. Normal heart size. No pericardial effusion. Atherosclerosis of thoracic aorta is noted. 4.2 cm ascending thoracic aortic aneurysm is noted. Mild coronary artery calcifications are noted. No pericardial effusion is noted. Mediastinum/Nodes: No enlarged mediastinal, hilar, or axillary lymph nodes. Thyroid gland, trachea, and esophagus demonstrate no significant findings. Lungs/Pleura: No pneumothorax or significant pleural effusion is noted. Mild bilateral posterior basilar  pneumonia or atelectasis is noted, with left greater than right. Airspace opacity is noted laterally in the right upper lobe also concerning for pneumonia or atelectasis. Upper Abdomen: No acute abnormality. Musculoskeletal: No chest wall abnormality. No acute or significant osseous findings. Review of the MIP images confirms the above findings. IMPRESSION: No definite evidence of pulmonary embolus. 4.2 cm ascending thoracic  aortic aneurysm. Recommend annual imaging followup by CTA or MRA. This recommendation follows 2010 ACCF/AHA/AATS/ACR/ASA/SCA/SCAI/SIR/STS/SVM Guidelines for the Diagnosis and Management of Patients with Thoracic Aortic Disease. Circulation. 2010; 121: H299-M426. Multifocal airspace opacities are noted in both lungs, particularly in both lung bases and right upper lobe laterally. These are concerning for pneumonia or atelectasis. Aortic Atherosclerosis (ICD10-I70.0). Electronically Signed   By: Marijo Conception, M.D.   On: 12/20/2016 09:24        Scheduled Meds: . aspirin EC  81 mg Oral Daily  . carvedilol  12.5 mg Oral BID WC  . dextromethorphan-guaiFENesin  1 tablet Oral BID  . enoxaparin (LOVENOX) injection  40 mg Subcutaneous Daily  . furosemide  80 mg Intravenous Daily  . ipratropium-albuterol  3 mL Nebulization QID  . mometasone-formoterol  2 puff Inhalation BID  . pantoprazole  40 mg Oral Daily  . potassium chloride  40 mEq Oral Daily  . propafenone  300 mg Oral BID  . rosuvastatin  10 mg Oral QHS  . tetrahydrozoline  1 drop Both Eyes Daily   Continuous Infusions: . piperacillin-tazobactam (ZOSYN)  IV 3.375 g (12/22/16 0339)     LOS: 6 days    Time spent: 40 minutes    Emilly Lavey, Geraldo Docker, MD [facility] Pager [phone]   If 7PM-7AM, please contact night-coverage [email or web address] Password [facility] 12/22/2016, 7:31 AM

## 2016-12-23 MED ORDER — OXYMETAZOLINE HCL 0.05 % NA SOLN
1.0000 | Freq: Two times a day (BID) | NASAL | Status: DC | PRN
  Administered 2016-12-23 – 2016-12-26 (×4): 1 via NASAL
  Filled 2016-12-23: qty 15

## 2016-12-23 MED ORDER — FUROSEMIDE 10 MG/ML IJ SOLN
80.0000 mg | Freq: Two times a day (BID) | INTRAMUSCULAR | Status: DC
  Administered 2016-12-23 – 2016-12-27 (×8): 80 mg via INTRAVENOUS
  Filled 2016-12-23 (×8): qty 8

## 2016-12-23 MED ORDER — SALINE SPRAY 0.65 % NA SOLN: 1.0000 | NASAL | Status: DC | PRN

## 2016-12-23 NOTE — Progress Notes (Signed)
Patient refuses CPAP. Patient on HFNC at 14L. RT will continue to monitor.

## 2016-12-23 NOTE — Progress Notes (Signed)
[facility] TEAM 1 - Stepdown/ICU TEAM  [name]  ZOX:[id number] DOB: 01/13/1941 DOA: 12/16/2016 PCP: Myer Peer, MD    Brief Narrative:  76 y.o.M Hx Anxiety, HTN, Ischemic Heart Disease, SVT secondary to AV re-entrant tachycardia, NSVT, Wolff-Parkinson-White syndrome, COPD (on 3 L O2 at home), HLD, and OSA on CPAP who presented with 1 week of cough and shortness of breath.  EMS reported he was satting 53% on RA initially.   During ER stay his CXR was suggestive of PNA, and he was noted to have a run of VT.   Subjective: The patient is laying flat in bed.  He states he feels better but is not yet back to his baseline.  He continues to have some intermittent shortness of breath.  He denies chest pain nausea vomiting or abdominal pain.  Assessment & Plan:  Acute hypoxic respiratory failure and sepsis due to multifocal pneumonia and acute COPD exacerbation Continues to require high-level oxygen support - wean as able but not yet making signif progress - has completed a course of empiric abx   Acute renal failure Baseline creatinine reportedly 1.1 - follow trend w/ ongoing attempts to diurese  Recent Labs Lab 12/18/16 0429 12/19/16 0258 12/20/16 0239 12/21/16 0142 12/22/16 0336  CREATININE 1.16 1.25* 1.26* 1.22 1.30*     Chronic diastolic congestive heart failure Cont in attempts to diurese in hopes of further helping in weaning of O2  Filed Weights   12/21/16 0454 12/22/16 0556 12/23/16 0500  Weight: 123.1 kg (271 lb 6.2 oz) 122.2 kg (269 lb 6.4 oz) 122.5 kg (270 lb)    Wolff-Parkinson-White syndrome  4.2 cm ascending thoracic aortic aneurysm Will need annual monitoring   Hypokalemia Due to diuretic w/ poor intake - corrected to normal range   HTN BP improving - follow w/ ongoing diuresis   OSA  Obesity - Body mass index is 37.66 kg/m.   DVT prophylaxis: Lovenox Code Status: FULL CODE Family Communication: no family present at time of exam  Disposition  Plan: ambulate - wean O2 as able   Consultants:  None  Procedures: 9/9 TTE EF 60-65% - grade 1 diastolic dysfunction  Antimicrobials:  Zosyn 9/5 > 9/12 Vancomycin 9/5 > 9/10  Objective: Blood pressure (!) 147/84, pulse 75, temperature 98.6 F (37 C), temperature source Oral, resp. rate (!) 22, height 5\' 11"  (1.803 m), weight 122.5 kg (270 lb), SpO2 92 %.  Intake/Output Summary (Last 24 hours) at 12/23/16 1351 Last data filed at 12/23/16 1100  Gross per 24 hour  Intake              340 ml  Output             1250 ml  Net             -910 ml   Filed Weights   12/21/16 0454 12/22/16 0556 12/23/16 0500  Weight: 123.1 kg (271 lb 6.2 oz) 122.2 kg (269 lb 6.4 oz) 122.5 kg (270 lb)    Examination: General: continues to require high-level oxygen support - no acute decompensation Lungs: distant breath sounds with fine crackles diffusely but no wheezing Cardiovascular: Regular rate and rhythm without murmur - distant HS  Abdomen: Nontender, obese, soft, bowel sounds positive, no rebound Extremities: 1+ B LE edema   CBC:  Recent Labs Lab 12/16/16 1619  12/18/16 0429 12/19/16 0258 12/20/16 0239 12/21/16 0142 12/22/16 0336  WBC 12.1*  < > 14.4* 15.6* 12.9* 13.4* 13.0*  NEUTROABS 9.7*  --   --   --   --   --   --  HGB 15.2  < > 13.9 16.0 14.7 14.4 15.0  HCT 47.9  < > 43.6 50.4 46.2 45.5 46.4  MCV 92.6  < > 93.6 93.2 92.4 92.7 92.4  PLT 218  < > 197 213 174 182 163  < > = values in this interval not displayed. Basic Metabolic Panel:  Recent Labs Lab 12/16/16 2356 12/17/16 4128 12/18/16 0429 12/19/16 0258 12/20/16 0239 12/21/16 0142 12/22/16 0336 12/22/16 1423  NA  --  138 135 138 136 138 137  --   K  --  4.1 3.9 4.0 3.6 3.4* 3.1* 3.9  CL  --  100* 100* 99* 93* 93* 93*  --   CO2  --  27 29 29  33* 36* 38*  --   GLUCOSE  --  142* 124* 129* 149* 166* 105*  --   BUN  --  35* 42* 30* 28* 23* 21*  --   CREATININE  --  1.17 1.16 1.25* 1.26* 1.22 1.30*  --   CALCIUM  --   8.0* 8.1* 8.4* 7.9* 8.0* 7.9*  --   MG 2.7* 2.9* 2.9* 2.3 1.9 2.1  --  2.0  PHOS 3.3 3.7  --   --   --   --   --   --    GFR: Estimated Creatinine Clearance: 63.4 mL/min (A) (by C-G formula based on SCr of 1.3 mg/dL (H)).  Liver Function Tests:  Recent Labs Lab 12/16/16 1619 12/17/16 0752  AST 39 38  ALT 25 29  ALKPHOS 131* 128*  BILITOT 0.9 0.7  PROT 6.3* 7.6  ALBUMIN 2.6* 2.5*    Coagulation Profile:  Recent Labs Lab 12/16/16 2356  INR 1.18    Cardiac Enzymes:  Recent Labs Lab 12/16/16 2356 12/17/16 0752  TROPONINI <0.03 0.05*     Recent Results (from the past 240 hour(s))  Culture, blood (Routine X 2) w Reflex to ID Panel     Status: None   Collection Time: 12/16/16  4:19 PM  Result Value Ref Range Status   Specimen Description BLOOD LEFT WRIST  Final   Special Requests   Final    BOTTLES DRAWN AEROBIC AND ANAEROBIC Blood Culture adequate volume   Culture NO GROWTH 5 DAYS  Final   Report Status 12/21/2016 FINAL  Final  Culture, blood (Routine X 2) w Reflex to ID Panel     Status: None   Collection Time: 12/16/16  5:00 PM  Result Value Ref Range Status   Specimen Description BLOOD LEFT HAND  Final   Special Requests   Final    BOTTLES DRAWN AEROBIC AND ANAEROBIC Blood Culture adequate volume   Culture NO GROWTH 5 DAYS  Final   Report Status 12/21/2016 FINAL  Final  MRSA PCR Screening     Status: None   Collection Time: 12/16/16 11:34 PM  Result Value Ref Range Status   MRSA by PCR NEGATIVE NEGATIVE Final    Comment:        The GeneXpert MRSA Assay (FDA approved for NASAL specimens only), is one component of a comprehensive MRSA colonization surveillance program. It is not intended to diagnose MRSA infection nor to guide or monitor treatment for MRSA infections.   Respiratory Panel by PCR     Status: None   Collection Time: 12/16/16 11:34 PM  Result Value Ref Range Status   Adenovirus NOT DETECTED NOT DETECTED Final   Coronavirus 229E NOT  DETECTED NOT DETECTED Final   Coronavirus HKU1 NOT DETECTED NOT DETECTED  Final   Coronavirus NL63 NOT DETECTED NOT DETECTED Final   Coronavirus OC43 NOT DETECTED NOT DETECTED Final   Metapneumovirus NOT DETECTED NOT DETECTED Final   Rhinovirus / Enterovirus NOT DETECTED NOT DETECTED Final   Influenza A NOT DETECTED NOT DETECTED Final   Influenza B NOT DETECTED NOT DETECTED Final   Parainfluenza Virus 1 NOT DETECTED NOT DETECTED Final   Parainfluenza Virus 2 NOT DETECTED NOT DETECTED Final   Parainfluenza Virus 3 NOT DETECTED NOT DETECTED Final   Parainfluenza Virus 4 NOT DETECTED NOT DETECTED Final   Respiratory Syncytial Virus NOT DETECTED NOT DETECTED Final   Bordetella pertussis NOT DETECTED NOT DETECTED Final   Chlamydophila pneumoniae NOT DETECTED NOT DETECTED Final   Mycoplasma pneumoniae NOT DETECTED NOT DETECTED Final  Culture, sputum-assessment     Status: None   Collection Time: 12/16/16 11:57 PM  Result Value Ref Range Status   Specimen Description Expect. Sput  Final   Special Requests Normal  Final   Sputum evaluation THIS SPECIMEN IS ACCEPTABLE FOR SPUTUM CULTURE  Final   Report Status 12/17/2016 FINAL  Final  Culture, respiratory (NON-Expectorated)     Status: None   Collection Time: 12/16/16 11:57 PM  Result Value Ref Range Status   Specimen Description Expect. Sput  Final   Special Requests Normal Reflexed from [id number]  Final   Gram Stain   Final    ABUNDANT WBC PRESENT, PREDOMINANTLY PMN FEW SQUAMOUS EPITHELIAL CELLS PRESENT MODERATE GRAM NEGATIVE RODS FEW GRAM POSITIVE COCCI RARE GRAM POSITIVE RODS    Culture Consistent with normal respiratory flora.  Final   Report Status 12/19/2016 FINAL  Final     Scheduled Meds: . aspirin EC  81 mg Oral Daily  . carvedilol  12.5 mg Oral BID WC  . dextromethorphan-guaiFENesin  1 tablet Oral BID  . enoxaparin (LOVENOX) injection  60 mg Subcutaneous Daily  . furosemide  80 mg Intravenous Daily  . ipratropium-albuterol   3 mL Nebulization TID  . mometasone-formoterol  2 puff Inhalation BID  . pantoprazole  40 mg Oral Daily  . potassium chloride  40 mEq Oral Daily  . propafenone  300 mg Oral BID  . rosuvastatin  10 mg Oral QHS  . tetrahydrozoline  1 drop Both Eyes Daily     LOS: 7 days   Cherene Altes, MD [facility] Office  [phone] Pager - Text Page per Amion as per below:  On-Call/Text Page:      [email or web address]      password [email or web address]  If 7PM-7AM, please contact night-coverage [email or web address] Password [email or web address] 12/23/2016, 1:51 PM

## 2016-12-24 ENCOUNTER — Inpatient Hospital Stay (HOSPITAL_COMMUNITY): Payer: Medicare HMO

## 2016-12-24 DIAGNOSIS — E662 Morbid (severe) obesity with alveolar hypoventilation: Secondary | ICD-10-CM

## 2016-12-24 LAB — URINALYSIS, ROUTINE W REFLEX MICROSCOPIC
BILIRUBIN URINE: NEGATIVE
GLUCOSE, UA: NEGATIVE mg/dL
KETONES UR: NEGATIVE mg/dL
NITRITE: NEGATIVE
PROTEIN: NEGATIVE mg/dL
Specific Gravity, Urine: 1.005 (ref 1.005–1.030)
Squamous Epithelial / LPF: NONE SEEN
pH: 7 (ref 5.0–8.0)

## 2016-12-24 LAB — MAGNESIUM: Magnesium: 1.8 mg/dL (ref 1.7–2.4)

## 2016-12-24 LAB — POTASSIUM: Potassium: 4.1 mmol/L (ref 3.5–5.1)

## 2016-12-24 LAB — BASIC METABOLIC PANEL
Anion gap: 9 (ref 5–15)
BUN: 21 mg/dL — AB (ref 6–20)
CHLORIDE: 93 mmol/L — AB (ref 101–111)
CO2: 34 mmol/L — ABNORMAL HIGH (ref 22–32)
CREATININE: 1.37 mg/dL — AB (ref 0.61–1.24)
Calcium: 7.8 mg/dL — ABNORMAL LOW (ref 8.9–10.3)
GFR, EST AFRICAN AMERICAN: 56 mL/min — AB (ref >60)
GFR, EST NON AFRICAN AMERICAN: 48 mL/min — AB (ref >60)
Glucose, Bld: 108 mg/dL — ABNORMAL HIGH (ref 65–99)
POTASSIUM: 3.2 mmol/L — AB (ref 3.5–5.1)
SODIUM: 136 mmol/L (ref 135–145)

## 2016-12-24 MED ORDER — PREDNISONE 20 MG PO TABS
60.0000 mg | ORAL_TABLET | Freq: Every day | ORAL | Status: DC
  Filled 2016-12-24: qty 3

## 2016-12-24 MED ORDER — POTASSIUM CHLORIDE CRYS ER 20 MEQ PO TBCR
60.0000 meq | EXTENDED_RELEASE_TABLET | Freq: Once | ORAL | Status: AC
  Administered 2016-12-24: 60 meq via ORAL
  Filled 2016-12-24: qty 3

## 2016-12-24 MED ORDER — PREDNISONE 20 MG PO TABS: 50.0000 mg | ORAL_TABLET | Freq: Every day | ORAL | Status: DC

## 2016-12-24 NOTE — Progress Notes (Signed)
PROGRESS NOTE    Chad Hogan  NUU:[id number] DOB: 08-06-1940 DOA: 12/16/2016 PCP: Myer Peer, MD   Brief Narrative:  76 y.o. WM PMHx Anxiety, HTN, Ischemic Heart Disease, SVT secondary to antidromic AV reentrant tachycardia, NSVT, Wolff-Parkinson-White syndrome, COPD (on 3 L O2 at home), HLD, OSA on CPAP,   Presented with cough and shortness of breath this been going on for past 1 week patient today called  EMS with me Atrovent 125 of Solu-Medrol on EMS report initially satting 53% in room air but improved to 92% nebulizer treatment. He reports sputum productive of green sputum wheezing, no sick contacts, subjective fever and chills, no chest pain Patient required up to 5 L of oxygen currently satting 87% chest x-ray worrisome for pneumonia. During ER stay patient was noted to have a run of VT. hemodynamically stable currently resolved   Regarding pertinent Chronic problems: Hx of SVT secondary to antidromic AV reentrant tachycardia sp unsuccessful ablation, responded well to propafenone,     Subjective: 9/13 A/O 4, negative CP, positive acute on chronic SOB, negative abdominal pain, negative N/V. Spoke with patient at length concerning not using his CPAP at night., Patient agreed to use CPAP      Assessment & Plan:   Active Problems:   Essential hypertension   WOLFF (WOLFE)-PARKINSON-WHITE (WPW) SYNDROME   Sleep apnea   Chronic diastolic heart failure (HCC)   CAP (community acquired pneumonia)   Dehydration   CKD (chronic kidney disease), stage III   Acute on chronic respiratory failure with hypoxia (HCC)   NSVT (nonsustained ventricular tachycardia) (HCC)   Sepsis (HCC)   Hypocalcemia   COPD exacerbation (HCC)     COPD Exacerbation/Multifocal Pneumonia/Sepsis CAP   -Complete a seven-day course antibiotics -Patient's respiratory status not improving as quickly as would be expected. Most likely combination of poor baseline respiratory status, refusing CPAP at night,  OHS.  -Mucinex DM BID -Flutter valve -DuoNeb QID -Dulera BID -Prednisone 60 mg daily. Patient most likely will be steroid dependent and need long taper. -Does not appear patient has been overseen by [facility]. Consult on 9/14 patient will need to follow-up given his deteriorating respiratory status -Titrate O2 to maintain SPO2 89-93%  Acute on chronic respiratory failure with hypoxia - 3 L O2 via [place] at home -See COPD exacerbation -Echocardiogram: Consistent with diastolic CHF see results below  -CT chest PE protocol: Consistent with multifocal pneumonia -negative PE  -9/13 PCXR: No significant change    Acute renal failure (baseline Cr 1.1)  Lab Results  Component Value Date   CREATININE 1.37 (H) 12/24/2016   CREATININE 1.30 (H) 12/22/2016   CREATININE 1.22 12/21/2016  -If Cr continues to Trend up decrease Lasix   Chronic diastolic CHF (unknown base weight) -Strict I&OSince admission -4.4 L -Daily weight Filed Weights   12/22/16 0556 12/23/16 0500 12/24/16 0607  Weight: 269 lb 6.4 oz (122.2 kg) 270 lb (122.5 kg) 265 lb 11.2 oz (120.5 kg)  -Coreg 12.5 mg BID -Lasix 80 mg BID. -TED hose knee-high  Wolff-Parkinson-White syndrome -Currently in NSR -Propafenone 300 mg BID  Essential hypertension -See CHF  Ascending thoracic aortic aneurysm -9/9 CT abdomen/pelvis: Shows 4.2 cm aneurysm see results below -Requires annual imaging  Hypocalcemia  Hypokalemia -Potassium goal> 4 -K-Dur 40 mEq daily -K- Dur 60 mEq 1  -Recheck K/Mg  @1300 : At goal    OSA/OHS -CPAP per respiratory -Spoke at length with patient on requirement to use CPAP.  Patient has agreed to use at  night.   -ABG in the a.m.   Goals of care -Consulted on 9/5 PT/OT: No follow-up recommended. Disagree with assessment patient extremely deconditioned, when closer to discharge would have reevaluated     DVT prophylaxis: Lovenox Code Status: Full Family Communication: None Disposition Plan:  TBD   Consultants:  None    Procedures/Significant Events:  9/9 CT angiogram chest PE protocol: -Negative PE . -4.2 cm Ascending Thoracic Aortic Aneurysm 4.2 cm ascending thoracic aortic aneurysm.   -Multifocal airspace opacities bilateral lung: Worse bibasilar and RUL pneumonia  vs atelectasis  9/9 Echocardiogram: LVEF= 60-65%.-Grade 1 diastolic dysfunction   I have personally reviewed and interpreted all radiology studies and my findings are as above.  VENTILATOR SETTINGS: None   Cultures 9/5 blood NGTD 9/5 MRSA by PCR negative 9/5 sputum normal flora 9/5 respiratory virus panel negative 9/5 HIV negative     Antimicrobials:Anti-infectives    Start     Stop   12/17/16 0900  vancomycin (VANCOCIN) IVPB 1000 mg/200 mL premix  Status:  Discontinued     12/21/16 1609   12/17/16 0300  piperacillin-tazobactam (ZOSYN) IVPB 3.375 g  Status:  Discontinued     12/23/16 1441   12/16/16 2030  piperacillin-tazobactam (ZOSYN) IVPB 3.375 g     12/16/16 2053   12/16/16 2030  vancomycin (VANCOCIN) 2,500 mg in sodium chloride 0.9 % 500 mL IVPB     12/16/16 2250   12/16/16 1615  cefTRIAXone (ROCEPHIN) 1 g in dextrose 5 % 50 mL IVPB     12/16/16 1809   12/16/16 1615  azithromycin (ZITHROMAX) 500 mg in dextrose 5 % 250 mL IVPB     12/16/16 1923       Devices    LINES / TUBES:      Continuous Infusions:    Objective: Vitals:   12/24/16 0003 12/24/16 0607 12/24/16 0800 12/24/16 0815  BP: 126/76 (!) 155/68  (!) 155/88  Pulse: (!) 57 70  78  Resp: 19 (!) 21  18  Temp: 99.2 F (37.3 C) 98.1 F (36.7 C)  98 F (36.7 C)  TempSrc: Oral Oral  Oral  SpO2: 96% 90% 91% (!) 83%  Weight:  265 lb 11.2 oz (120.5 kg)    Height:        Intake/Output Summary (Last 24 hours) at 12/24/16 0934 Last data filed at 12/24/16 3875  Gross per 24 hour  Intake              240 ml  Output             1700 ml  Net            -1460 ml   Filed Weights   12/22/16 0556 12/23/16 0500  12/24/16 0607  Weight: 269 lb 6.4 oz (122.2 kg) 270 lb (122.5 kg) 265 lb 11.2 oz (120.5 kg)   Physical Exam:  General: A/O 4, positive acute on chronic respiratory failure (patient still requiring significant O2 support),  Neck:  Negative scars, masses, torticollis, lymphadenopathy, JVD Lungs: Clear to auscultation bilaterally without wheezes. Bibasilar cracklesRt>>Lt Cardiovascular: Regular rate and rhythm without murmur gallop or rub normal S1 and S2 Abdomen: Morbidly obese, negative abdominal pain, nondistended, positive soft, bowel sounds, no rebound, no ascites, no appreciable mass Extremities: No significant cyanosis, clubbing. Bilateral pedal edema 1+,  Skin: Negative rashes, lesions, ulcers Psychiatric:  Negative depression, negative anxiety, negative fatigue, negative mania  Central nervous system:  Cranial nerves II through XII intact, tongue/uvula midline, all extremities  muscle strength 5/5, sensation intact throughout, negative dysarthria, negative expressive aphasia, negative receptive aphasia.     .     Data Reviewed: Care during the described time interval was provided by me .  I have reviewed this patient's available data, including medical history, events of note, physical examination, and all test results as part of my evaluation.   CBC:  Recent Labs Lab 12/18/16 0429 12/19/16 0258 12/20/16 0239 12/21/16 0142 12/22/16 0336  WBC 14.4* 15.6* 12.9* 13.4* 13.0*  HGB 13.9 16.0 14.7 14.4 15.0  HCT 43.6 50.4 46.2 45.5 46.4  MCV 93.6 93.2 92.4 92.7 92.4  PLT 197 213 174 182 149   Basic Metabolic Panel:  Recent Labs Lab 12/18/16 0429 12/19/16 0258 12/20/16 0239 12/21/16 0142 12/22/16 0336 12/22/16 1423 12/24/16 0215  NA 135 138 136 138 137  --  136  K 3.9 4.0 3.6 3.4* 3.1* 3.9 3.2*  CL 100* 99* 93* 93* 93*  --  93*  CO2 29 29 33* 36* 38*  --  34*  GLUCOSE 124* 129* 149* 166* 105*  --  108*  BUN 42* 30* 28* 23* 21*  --  21*  CREATININE 1.16 1.25*  1.26* 1.22 1.30*  --  1.37*  CALCIUM 8.1* 8.4* 7.9* 8.0* 7.9*  --  7.8*  MG 2.9* 2.3 1.9 2.1  --  2.0  --    GFR: Estimated Creatinine Clearance: 59.7 mL/min (A) (by C-G formula based on SCr of 1.37 mg/dL (H)). Liver Function Tests: No results for input(s): AST, ALT, ALKPHOS, BILITOT, PROT, ALBUMIN in the last 168 hours. No results for input(s): LIPASE, AMYLASE in the last 168 hours. No results for input(s): AMMONIA in the last 168 hours. Coagulation Profile: No results for input(s): INR, PROTIME in the last 168 hours. Cardiac Enzymes: No results for input(s): CKTOTAL, CKMB, CKMBINDEX, TROPONINI in the last 168 hours. BNP (last 3 results) No results for input(s): PROBNP in the last 8760 hours. HbA1C: No results for input(s): HGBA1C in the last 72 hours. CBG: No results for input(s): GLUCAP in the last 168 hours. Lipid Profile: No results for input(s): CHOL, HDL, LDLCALC, TRIG, CHOLHDL, LDLDIRECT in the last 72 hours. Thyroid Function Tests: No results for input(s): TSH, T4TOTAL, FREET4, T3FREE, THYROIDAB in the last 72 hours. Anemia Panel: No results for input(s): VITAMINB12, FOLATE, FERRITIN, TIBC, IRON, RETICCTPCT in the last 72 hours. Urine analysis:    Component Value Date/Time   COLORURINE BROWN (A) 07/27/2016 2049   APPEARANCEUR CLOUDY (A) 07/27/2016 2049   LABSPEC 1.023 07/27/2016 2049   PHURINE 5.0 07/27/2016 2049   GLUCOSEU NEGATIVE 07/27/2016 2049   HGBUR MODERATE (A) 07/27/2016 2049   BILIRUBINUR NEGATIVE 07/27/2016 2049   [place] 07/27/2016 2049   PROTEINUR 100 (A) 07/27/2016 2049   NITRITE NEGATIVE 07/27/2016 2049   LEUKOCYTESUR NEGATIVE 07/27/2016 2049   Sepsis Labs: @LABRCNTIP (procalcitonin:4,lacticidven:4)  ) Recent Results (from the past 240 hour(s))  Culture, blood (Routine X 2) w Reflex to ID Panel     Status: None   Collection Time: 12/16/16  4:19 PM  Result Value Ref Range Status   Specimen Description BLOOD LEFT WRIST  Final   Special  Requests   Final    BOTTLES DRAWN AEROBIC AND ANAEROBIC Blood Culture adequate volume   Culture NO GROWTH 5 DAYS  Final   Report Status 12/21/2016 FINAL  Final  Culture, blood (Routine X 2) w Reflex to ID Panel     Status: None   Collection Time: 12/16/16  5:00 PM  Result Value Ref Range Status   Specimen Description BLOOD LEFT HAND  Final   Special Requests   Final    BOTTLES DRAWN AEROBIC AND ANAEROBIC Blood Culture adequate volume   Culture NO GROWTH 5 DAYS  Final   Report Status 12/21/2016 FINAL  Final  MRSA PCR Screening     Status: None   Collection Time: 12/16/16 11:34 PM  Result Value Ref Range Status   MRSA by PCR NEGATIVE NEGATIVE Final    Comment:        The GeneXpert MRSA Assay (FDA approved for NASAL specimens only), is one component of a comprehensive MRSA colonization surveillance program. It is not intended to diagnose MRSA infection nor to guide or monitor treatment for MRSA infections.   Respiratory Panel by PCR     Status: None   Collection Time: 12/16/16 11:34 PM  Result Value Ref Range Status   Adenovirus NOT DETECTED NOT DETECTED Final   Coronavirus 229E NOT DETECTED NOT DETECTED Final   Coronavirus HKU1 NOT DETECTED NOT DETECTED Final   Coronavirus NL63 NOT DETECTED NOT DETECTED Final   Coronavirus OC43 NOT DETECTED NOT DETECTED Final   Metapneumovirus NOT DETECTED NOT DETECTED Final   Rhinovirus / Enterovirus NOT DETECTED NOT DETECTED Final   Influenza A NOT DETECTED NOT DETECTED Final   Influenza B NOT DETECTED NOT DETECTED Final   Parainfluenza Virus 1 NOT DETECTED NOT DETECTED Final   Parainfluenza Virus 2 NOT DETECTED NOT DETECTED Final   Parainfluenza Virus 3 NOT DETECTED NOT DETECTED Final   Parainfluenza Virus 4 NOT DETECTED NOT DETECTED Final   Respiratory Syncytial Virus NOT DETECTED NOT DETECTED Final   Bordetella pertussis NOT DETECTED NOT DETECTED Final   Chlamydophila pneumoniae NOT DETECTED NOT DETECTED Final   Mycoplasma pneumoniae  NOT DETECTED NOT DETECTED Final  Culture, sputum-assessment     Status: None   Collection Time: 12/16/16 11:57 PM  Result Value Ref Range Status   Specimen Description Expect. Sput  Final   Special Requests Normal  Final   Sputum evaluation THIS SPECIMEN IS ACCEPTABLE FOR SPUTUM CULTURE  Final   Report Status 12/17/2016 FINAL  Final  Culture, respiratory (NON-Expectorated)     Status: None   Collection Time: 12/16/16 11:57 PM  Result Value Ref Range Status   Specimen Description Expect. Sput  Final   Special Requests Normal Reflexed from [id number]  Final   Gram Stain   Final    ABUNDANT WBC PRESENT, PREDOMINANTLY PMN FEW SQUAMOUS EPITHELIAL CELLS PRESENT MODERATE GRAM NEGATIVE RODS FEW GRAM POSITIVE COCCI RARE GRAM POSITIVE RODS    Culture Consistent with normal respiratory flora.  Final   Report Status 12/19/2016 FINAL  Final         Radiology Studies: Dg Chest Port 1 View  Result Date: 12/24/2016 CLINICAL DATA:  Cough and shortness of Breath EXAM: PORTABLE CHEST 1 VIEW COMPARISON:  12/20/2016 FINDINGS: Cardiac shadow is mildly enlarged but stable. Prominence of the thoracic aorta is again seen. Bibasilar changes are noted right slightly greater than left similar to that seen on the prior exam. No new focal abnormality is noted. IMPRESSION: Bibasilar infiltrative change stable from previous CT. Electronically Signed   By: Inez Catalina M.D.   On: 12/24/2016 07:40        Scheduled Meds: . aspirin EC  81 mg Oral Daily  . carvedilol  12.5 mg Oral BID WC  . dextromethorphan-guaiFENesin  1 tablet Oral BID  . enoxaparin (LOVENOX) injection  60 mg Subcutaneous Daily  . furosemide  80 mg Intravenous BID  . ipratropium-albuterol  3 mL Nebulization TID  . mometasone-formoterol  2 puff Inhalation BID  . pantoprazole  40 mg Oral Daily  . potassium chloride  40 mEq Oral Daily  . propafenone  300 mg Oral BID  . rosuvastatin  10 mg Oral QHS  . tetrahydrozoline  1 drop Both Eyes Daily    Continuous Infusions:    LOS: 8 days    Time spent: 40 minutes    Timmi Devora, Geraldo Docker, MD [facility] Pager [phone]   If 7PM-7AM, please contact night-coverage [email or web address] Password [facility] 12/24/2016, 9:34 AM

## 2016-12-25 DIAGNOSIS — J9601 Acute respiratory failure with hypoxia: Secondary | ICD-10-CM

## 2016-12-25 LAB — URINE CULTURE: CULTURE: NO GROWTH

## 2016-12-25 LAB — BLOOD GAS, ARTERIAL
Acid-Base Excess: 10.2 mmol/L — ABNORMAL HIGH (ref 0.0–2.0)
Bicarbonate: 35.1 mmol/L — ABNORMAL HIGH (ref 20.0–28.0)
DELIVERY SYSTEMS: POSITIVE
Drawn by: 42624
EXPIRATORY PAP: 6
FIO2: 50
INSPIRATORY PAP: 10
LHR: 8 {breaths}/min
O2 Saturation: 92.6 %
PH ART: 7.409 (ref 7.350–7.450)
Patient temperature: 98.6
pCO2 arterial: 56.7 mmHg — ABNORMAL HIGH (ref 32.0–48.0)
pO2, Arterial: 67.2 mmHg — ABNORMAL LOW (ref 83.0–108.0)

## 2016-12-25 MED ORDER — PREDNISONE 20 MG PO TABS
60.0000 mg | ORAL_TABLET | Freq: Every day | ORAL | Status: DC
  Administered 2016-12-25 – 2016-12-27 (×3): 60 mg via ORAL
  Filled 2016-12-25 (×2): qty 3

## 2016-12-25 NOTE — Progress Notes (Signed)
Physical Therapy Treatment Patient Details Name: Chad Hogan MRN: [id number] DOB: September 25, 1940 Today's Date: 12/25/2016    History of Present Illness Patient is a 76 yo male admitted 9/5 with SOB and PNA. 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     PT Comments    Pt still requiring lots of supplemental O2 and fatigues very quickly with activity. Recommend HHPT and then would hopefully progress to outpatient pulmonary rehab.   Follow Up Recommendations  Home health PT (to progress toward outpatient pulmonary rehab)     Equipment Recommendations  None recommended by PT    Recommendations for Other Services       Precautions / Restrictions Precautions Precautions: Other (comment) Precaution Comments: watch 02 sats Restrictions Weight Bearing Restrictions: No    Mobility  Bed Mobility Overal bed mobility: Modified Independent             General bed mobility comments: Pt at EOB upon arrival  Transfers Overall transfer level: Needs assistance Equipment used: None Transfers: Sit to/from Stand Sit to Stand: Supervision         General transfer comment: Supervision for safety. Pt feels fatigued.  Ambulation/Gait Ambulation/Gait assistance: Supervision Ambulation Distance (Feet): 150 Feet Assistive device:  (Pushing IV pole) Gait Pattern/deviations: Step-through pattern;Decreased stride length Gait velocity: decr Gait velocity interpretation: Below normal speed for age/gender General Gait Details: Pt amb on 15L of O2 via HFNC. SpO2 89-92%. Pt stopped x 4 to take standing rest break   Stairs            Wheelchair Mobility    Modified Rankin (Stroke Patients Only)       Balance Overall balance assessment: Needs assistance Sitting-balance support: No upper extremity supported;Feet supported Sitting balance-Leahy Scale: Normal     Standing  balance support: No upper extremity supported Standing balance-Leahy Scale: Fair                              Cognition Arousal/Alertness: Awake/alert Behavior During Therapy: WFL for tasks assessed/performed Overall Cognitive Status: Within Functional Limits for tasks assessed                                        Exercises      General Comments        Pertinent Vitals/Pain Pain Assessment: No/denies pain    Home Living                      Prior Function            PT Goals (current goals can now be found in the care plan section) Progress towards PT goals: Not progressing toward goals - comment (quicker fatigue today)    Frequency    Min 3X/week      PT Plan Discharge plan needs to be updated    Co-evaluation              AM-PAC PT "6 Clicks" Daily Activity  Outcome Measure  Difficulty turning over in bed (including adjusting bedclothes, sheets and blankets)?: None Difficulty moving from lying on back to sitting on the side of the bed? : A Little Difficulty sitting down on and standing up from a chair with arms (e.g., wheelchair, bedside commode, etc,.)?:  A Little Help needed moving to and from a bed to chair (including a wheelchair)?: None Help needed walking in hospital room?: A Little Help needed climbing 3-5 steps with a railing? : A Little 6 Click Score: 20    End of Session Equipment Utilized During Treatment: Oxygen Activity Tolerance: Patient limited by fatigue Patient left: in bed;with call bell/phone within reach (Siting EOB) Nurse Communication: Mobility status PT Visit Diagnosis: Difficulty in walking, not elsewhere classified (R26.2)     Time: 5498-2641 PT Time Calculation (min) (ACUTE ONLY): 15 min  Charges:  $Gait Training: 8-22 mins                    G Codes:       [facility] PT [place] 12/25/2016, 11:23 AM

## 2016-12-25 NOTE — Progress Notes (Signed)
Physical Therapy Treatment Patient Details Name: Chad Hogan MRN: [id number] DOB: 07/11/1940 Today's Date: 12/25/2016    History of Present Illness Patient is a 76 yo male admitted 9/5 with SOB and PNA. 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     PT Comments    Pt needs short amounts of activity frequently. Asked nursing to amb over the weekend. Still on HFNC.   Follow Up Recommendations  Home health PT (to progress toward outpatient pulmonary rehab)     Equipment Recommendations  None recommended by PT    Recommendations for Other Services       Precautions / Restrictions Precautions Precautions: Other (comment) Precaution Comments: watch 02 sats Restrictions Weight Bearing Restrictions: No    Mobility  Bed Mobility Overal bed mobility: Modified Independent             General bed mobility comments: Pt at EOB upon arrival  Transfers Overall transfer level: Needs assistance Equipment used: None Transfers: Sit to/from Stand Sit to Stand: Supervision         General transfer comment: Supervision for safety.   Ambulation/Gait Ambulation/Gait assistance: Supervision Ambulation Distance (Feet): 160 Feet Assistive device:  (Pushing IV pole) Gait Pattern/deviations: Step-through pattern;Decreased stride length Gait velocity: decr Gait velocity interpretation: Below normal speed for age/gender General Gait Details: Pt amb on 15L of O2 via HFNC. SpO2 90-92%. Pt stopped x 1 to take standing rest break   Stairs            Wheelchair Mobility    Modified Rankin (Stroke Patients Only)       Balance Overall balance assessment: Needs assistance Sitting-balance support: No upper extremity supported;Feet supported Sitting balance-Leahy Scale: Normal     Standing balance support: No upper extremity supported Standing balance-Leahy Scale: Fair                               Cognition Arousal/Alertness: Awake/alert Behavior During Therapy: WFL for tasks assessed/performed Overall Cognitive Status: Within Functional Limits for tasks assessed                                        Exercises      General Comments        Pertinent Vitals/Pain Pain Assessment: No/denies pain    Home Living                      Prior Function            PT Goals (current goals can now be found in the care plan section) Progress towards PT goals: Progressing toward goals    Frequency    Min 3X/week      PT Plan Current plan remains appropriate    Co-evaluation              AM-PAC PT "6 Clicks" Daily Activity  Outcome Measure  Difficulty turning over in bed (including adjusting bedclothes, sheets and blankets)?: None Difficulty moving from lying on back to sitting on the side of the bed? : A Little Difficulty sitting down on and standing up from a chair with arms (e.g., wheelchair, bedside commode, etc,.)?: A Little Help needed moving to and from a bed to chair (including a wheelchair)?: None Help  needed walking in hospital room?: A Little Help needed climbing 3-5 steps with a railing? : A Little 6 Click Score: 20    End of Session Equipment Utilized During Treatment: Oxygen Activity Tolerance: Patient limited by fatigue Patient left: in bed;with call bell/phone within reach (Siting EOB) Nurse Communication: Mobility status;Other (comment) (Asked nursing to amb over the weekend) PT Visit Diagnosis: Difficulty in walking, not elsewhere classified (R26.2)     Time: 7564-3329 PT Time Calculation (min) (ACUTE ONLY): 16 min  Charges:  $Gait Training: 8-22 mins                    G Codes:       [facility] PT [place] 12/25/2016, 3:42 PM

## 2016-12-25 NOTE — Progress Notes (Signed)
[place] TEAM 1 - Stepdown/ICU TEAM  ADOLPHUS HANF  FAO:[id number] DOB: 28-Sep-1940 DOA: 12/16/2016 PCP: Myer Peer, MD    Brief Narrative:  76 y.o.M Hx Anxiety, HTN, Ischemic Heart Disease, SVT secondary to AV re-entrant tachycardia, NSVT, Wolff-Parkinson-White syndrome, COPD (on 3 L O2 at home), HLD, and OSA on CPAP who presented with 1 week of cough and shortness of breath.  EMS reported he was satting 53% on RA initially.   During ER stay his CXR was suggestive of PNA, and he was noted to have a run of VT.   Subjective: Slowly and steadily feeling somewhat better.  Denies cp, n/v, or abdom pain.    Assessment & Plan:  Acute hypoxic respiratory failure and sepsis due to multifocal pneumonia and acute COPD exacerbation Continues to require high-level oxygen support - has completed a course of empiric abx - cont to wean O2 as able   Acute renal failure Baseline creatinine reportedly 1.1 - follow trend w/ ongoing attempts to diurese - renal fxn may soon prove to be limiting issue   Recent Labs Lab 12/19/16 0258 12/20/16 0239 12/21/16 0142 12/22/16 0336 12/24/16 0215  CREATININE 1.25* 1.26* 1.22 1.30* 1.37*     Chronic diastolic congestive heart failure Cont in attempts to diurese - follow renal fxn   Filed Weights   12/23/16 0500 12/24/16 0607 12/25/16 0411  Weight: 122.5 kg (270 lb) 120.5 kg (265 lb 11.2 oz) 120.4 kg (265 lb 6.4 oz)    Wolff-Parkinson-White syndrome  4.2 cm ascending thoracic aortic aneurysm Will need annual monitoring   Hypokalemia Due to diuretic w/ poor intake - corrected   HTN BP control improved   OSA  Obesity - Body mass index is 37.02 kg/m.   DVT prophylaxis: Lovenox Code Status: FULL CODE Family Communication: no family present at time of exam  Disposition Plan: ambulate - wean O2 as able   Consultants:  None  Procedures: 9/9 TTE EF 60-65% - grade 1 diastolic dysfunction  Antimicrobials:  Zosyn 9/5 > 9/12 Vancomycin  9/5 > 9/10  Objective: Blood pressure 130/74, pulse 64, temperature 98.6 F (37 C), temperature source Oral, resp. rate 18, height 5\' 11"  (1.803 m), weight 120.4 kg (265 lb 6.4 oz), SpO2 95 %.  Intake/Output Summary (Last 24 hours) at 12/25/16 1620 Last data filed at 12/25/16 1023  Gross per 24 hour  Intake              360 ml  Output             1800 ml  Net            -1440 ml   Filed Weights   12/23/16 0500 12/24/16 0607 12/25/16 0411  Weight: 122.5 kg (270 lb) 120.5 kg (265 lb 11.2 oz) 120.4 kg (265 lb 6.4 oz)    Examination: General: alert and conversant  Lungs: distant breath sounds - fine crackles diffusely  Cardiovascular: Regular rate and rhythm without murmur - very distant HS  Abdomen: Nontender, morbidly obese, soft, bowel sounds positive, no rebound Extremities: 1+ B LE edema w/o change   CBC:  Recent Labs Lab 12/19/16 0258 12/20/16 0239 12/21/16 0142 12/22/16 0336  WBC 15.6* 12.9* 13.4* 13.0*  HGB 16.0 14.7 14.4 15.0  HCT 50.4 46.2 45.5 46.4  MCV 93.2 92.4 92.7 92.4  PLT 213 174 182 696   Basic Metabolic Panel:  Recent Labs Lab 12/19/16 0258 12/20/16 0239 12/21/16 0142 12/22/16 0336 12/22/16 1423 12/24/16 0215 12/24/16 1308  NA 138 136 138 137  --  136  --   K 4.0 3.6 3.4* 3.1* 3.9 3.2* 4.1  CL 99* 93* 93* 93*  --  93*  --   CO2 29 33* 36* 38*  --  34*  --   GLUCOSE 129* 149* 166* 105*  --  108*  --   BUN 30* 28* 23* 21*  --  21*  --   CREATININE 1.25* 1.26* 1.22 1.30*  --  1.37*  --   CALCIUM 8.4* 7.9* 8.0* 7.9*  --  7.8*  --   MG 2.3 1.9 2.1  --  2.0  --  1.8   GFR: Estimated Creatinine Clearance: 59.6 mL/min (A) (by C-G formula based on SCr of 1.37 mg/dL (H)).   Recent Results (from the past 240 hour(s))  Culture, blood (Routine X 2) w Reflex to ID Panel     Status: None   Collection Time: 12/16/16  4:19 PM  Result Value Ref Range Status   Specimen Description BLOOD LEFT WRIST  Final   Special Requests   Final    BOTTLES DRAWN  AEROBIC AND ANAEROBIC Blood Culture adequate volume   Culture NO GROWTH 5 DAYS  Final   Report Status 12/21/2016 FINAL  Final  Culture, blood (Routine X 2) w Reflex to ID Panel     Status: None   Collection Time: 12/16/16  5:00 PM  Result Value Ref Range Status   Specimen Description BLOOD LEFT HAND  Final   Special Requests   Final    BOTTLES DRAWN AEROBIC AND ANAEROBIC Blood Culture adequate volume   Culture NO GROWTH 5 DAYS  Final   Report Status 12/21/2016 FINAL  Final  MRSA PCR Screening     Status: None   Collection Time: 12/16/16 11:34 PM  Result Value Ref Range Status   MRSA by PCR NEGATIVE NEGATIVE Final    Comment:        The GeneXpert MRSA Assay (FDA approved for NASAL specimens only), is one component of a comprehensive MRSA colonization surveillance program. It is not intended to diagnose MRSA infection nor to guide or monitor treatment for MRSA infections.   Respiratory Panel by PCR     Status: None   Collection Time: 12/16/16 11:34 PM  Result Value Ref Range Status   Adenovirus NOT DETECTED NOT DETECTED Final   Coronavirus 229E NOT DETECTED NOT DETECTED Final   Coronavirus HKU1 NOT DETECTED NOT DETECTED Final   Coronavirus NL63 NOT DETECTED NOT DETECTED Final   Coronavirus OC43 NOT DETECTED NOT DETECTED Final   Metapneumovirus NOT DETECTED NOT DETECTED Final   Rhinovirus / Enterovirus NOT DETECTED NOT DETECTED Final   Influenza A NOT DETECTED NOT DETECTED Final   Influenza B NOT DETECTED NOT DETECTED Final   Parainfluenza Virus 1 NOT DETECTED NOT DETECTED Final   Parainfluenza Virus 2 NOT DETECTED NOT DETECTED Final   Parainfluenza Virus 3 NOT DETECTED NOT DETECTED Final   Parainfluenza Virus 4 NOT DETECTED NOT DETECTED Final   Respiratory Syncytial Virus NOT DETECTED NOT DETECTED Final   Bordetella pertussis NOT DETECTED NOT DETECTED Final   Chlamydophila pneumoniae NOT DETECTED NOT DETECTED Final   Mycoplasma pneumoniae NOT DETECTED NOT DETECTED Final    Culture, sputum-assessment     Status: None   Collection Time: 12/16/16 11:57 PM  Result Value Ref Range Status   Specimen Description Expect. Sput  Final   Special Requests Normal  Final   Sputum evaluation THIS SPECIMEN IS  ACCEPTABLE FOR SPUTUM CULTURE  Final   Report Status 12/17/2016 FINAL  Final  Culture, respiratory (NON-Expectorated)     Status: None   Collection Time: 12/16/16 11:57 PM  Result Value Ref Range Status   Specimen Description Expect. Sput  Final   Special Requests Normal Reflexed from [id number]  Final   Gram Stain   Final    ABUNDANT WBC PRESENT, PREDOMINANTLY PMN FEW SQUAMOUS EPITHELIAL CELLS PRESENT MODERATE GRAM NEGATIVE RODS FEW GRAM POSITIVE COCCI RARE GRAM POSITIVE RODS    Culture Consistent with normal respiratory flora.  Final   Report Status 12/19/2016 FINAL  Final  Culture, Urine     Status: None   Collection Time: 12/24/16  4:44 PM  Result Value Ref Range Status   Specimen Description URINE, CLEAN CATCH  Final   Special Requests NONE  Final   Culture NO GROWTH  Final   Report Status 12/25/2016 FINAL  Final     Scheduled Meds: . aspirin EC  81 mg Oral Daily  . carvedilol  12.5 mg Oral BID WC  . dextromethorphan-guaiFENesin  1 tablet Oral BID  . enoxaparin (LOVENOX) injection  60 mg Subcutaneous Daily  . furosemide  80 mg Intravenous BID  . ipratropium-albuterol  3 mL Nebulization TID  . mometasone-formoterol  2 puff Inhalation BID  . pantoprazole  40 mg Oral Daily  . potassium chloride  40 mEq Oral Daily  . predniSONE  60 mg Oral Q breakfast  . propafenone  300 mg Oral BID  . rosuvastatin  10 mg Oral QHS  . tetrahydrozoline  1 drop Both Eyes Daily     LOS: 9 days   Cherene Altes, MD [facility] Office  [phone] Pager - Text Page per Amion as per below:  On-Call/Text Page:      [email or web address]      password [email or web address]  If 7PM-7AM, please contact night-coverage [email or web address] Password [email or web address] 12/25/2016, 4:20 PM

## 2016-12-25 NOTE — Plan of Care (Signed)
Problem: Safety: Goal: Ability to remain free from injury will improve Outcome: Progressing BSC, call light, and personal items within reach at bedside.  Pt compliant with calling staff when getting out of bed.  Problem: Physical Regulation: Goal: Will remain free from infection Outcome: Progressing No s/s of infection.

## 2016-12-26 LAB — BASIC METABOLIC PANEL
Anion gap: 8 (ref 5–15)
BUN: 31 mg/dL — AB (ref 6–20)
CALCIUM: 8.1 mg/dL — AB (ref 8.9–10.3)
CHLORIDE: 97 mmol/L — AB (ref 101–111)
CO2: 30 mmol/L (ref 22–32)
CREATININE: 1.39 mg/dL — AB (ref 0.61–1.24)
GFR calc non Af Amer: 47 mL/min — ABNORMAL LOW (ref >60)
GFR, EST AFRICAN AMERICAN: 55 mL/min — AB (ref >60)
Glucose, Bld: 112 mg/dL — ABNORMAL HIGH (ref 65–99)
Potassium: 4.1 mmol/L (ref 3.5–5.1)
SODIUM: 135 mmol/L (ref 135–145)

## 2016-12-26 MED ORDER — HYDROCORTISONE ACETATE 25 MG RE SUPP
25.0000 mg | Freq: Two times a day (BID) | RECTAL | Status: DC
  Administered 2016-12-26 – 2017-01-01 (×12): 25 mg via RECTAL
  Filled 2016-12-26 (×13): qty 1

## 2016-12-26 MED ORDER — ALBUTEROL SULFATE (2.5 MG/3ML) 0.083% IN NEBU: 2.5000 mg | INHALATION_SOLUTION | RESPIRATORY_TRACT | Status: DC | PRN

## 2016-12-26 MED ORDER — FLUTICASONE PROPIONATE 50 MCG/ACT NA SUSP
2.0000 | Freq: Every day | NASAL | Status: DC
  Administered 2016-12-26 – 2017-01-01 (×6): 2 via NASAL
  Filled 2016-12-26: qty 16

## 2016-12-26 MED ORDER — TIOTROPIUM BROMIDE MONOHYDRATE 18 MCG IN CAPS
18.0000 ug | ORAL_CAPSULE | Freq: Every day | RESPIRATORY_TRACT | Status: DC
  Administered 2016-12-26 – 2017-01-01 (×6): 18 ug via RESPIRATORY_TRACT
  Filled 2016-12-26 (×2): qty 5

## 2016-12-26 NOTE — Progress Notes (Signed)
Pt with hemorrhoids and having bloody BMs. Requesting anusol suppository. Will notify oncall.

## 2016-12-26 NOTE — Progress Notes (Signed)
[place] TEAM 1 - Stepdown/ICU TEAM  Chad Hogan  HYW:[id number] DOB: January 04, 1941 DOA: 12/16/2016 PCP: Myer Peer, MD    Brief Narrative:  76 y.o.M Hx Anxiety, HTN, Ischemic Heart Disease, SVT secondary to AV re-entrant tachycardia, NSVT, Wolff-Parkinson-White syndrome, COPD (on 3 L O2 at home), HLD, and OSA on CPAP who presented with 1 week of cough and shortness of breath.  EMS reported he was satting 53% on RA initially.   During ER stay his CXR was suggestive of PNA, and he was noted to have a run of VT.   Subjective: Has begun ambulating in hallway w/ RN assistance.  Requiring 10L HFNC O2 but doing well w/ movement thus far.    Assessment & Plan:  Acute hypoxic respiratory failure and sepsis due to multifocal pneumonia and acute COPD exacerbation Continues to require high-level oxygen support - has completed a course of empiric abx - cont to wean O2 as able - f/u CXR in AM - use IS - up in chair as much as possible   Acute renal failure Baseline creatinine reportedly 1.1 - follow trend w/ ongoing attempts to diurese - renal fxn may soon prove to be limiting issue   Recent Labs Lab 12/20/16 0239 12/21/16 0142 12/22/16 0336 12/24/16 0215 12/26/16 0340  CREATININE 1.26* 1.22 1.30* 1.37* 1.39*     Chronic diastolic congestive heart failure Cont in attempts to diurese - net negative ~8.5l - we appear to be holding steady on our present diuretic dose w/o loss or gain - follow renal fxn   Filed Weights   12/23/16 0500 12/24/16 0607 12/25/16 0411  Weight: 122.5 kg (270 lb) 120.5 kg (265 lb 11.2 oz) 120.4 kg (265 lb 6.4 oz)    Wolff-Parkinson-White syndrome  4.2 cm ascending thoracic aortic aneurysm Will need annual monitoring   Hypokalemia Due to diuretic w/ poor intake - corrected   HTN BP controlled   OSA  Obesity - Body mass index is 37.02 kg/m.   DVT prophylaxis: Lovenox Code Status: FULL CODE Family Communication: no family present at time of exam    Disposition Plan: ambulate - wean O2 as able - pt lives alone in [place] so he will need to be independent prior to d/c   Consultants:  None  Procedures: 9/9 TTE EF 60-65% - grade 1 diastolic dysfunction  Antimicrobials:  Zosyn 9/5 > 9/12 Vancomycin 9/5 > 9/10  Objective: Blood pressure 125/77, pulse 73, temperature 98.1 F (36.7 C), temperature source Oral, resp. rate 20, height 5\' 11"  (1.803 m), weight 120.4 kg (265 lb 6.4 oz), SpO2 93 %.  Intake/Output Summary (Last 24 hours) at 12/26/16 1559 Last data filed at 12/26/16 1300  Gross per 24 hour  Intake              480 ml  Output             1500 ml  Net            -1020 ml   Filed Weights   12/23/16 0500 12/24/16 0607 12/25/16 0411  Weight: 122.5 kg (270 lb) 120.5 kg (265 lb 11.2 oz) 120.4 kg (265 lb 6.4 oz)    Examination: General: alert and conversant  Lungs: distant breath sounds - fine crackles diffusely - no wheezing  Cardiovascular: RRR - very distant HS  Abdomen: Nontender, morbidly obese, soft, bowel sounds positive, no rebound Extremities: 1+ B LE edema stable   CBC:  Recent Labs Lab 12/20/16 0239 12/21/16 0142  12/22/16 0336  WBC 12.9* 13.4* 13.0*  HGB 14.7 14.4 15.0  HCT 46.2 45.5 46.4  MCV 92.4 92.7 92.4  PLT 174 182 831   Basic Metabolic Panel:  Recent Labs Lab 12/20/16 0239 12/21/16 0142 12/22/16 0336 12/22/16 1423 12/24/16 0215 12/24/16 1308 12/26/16 0340  NA 136 138 137  --  136  --  135  K 3.6 3.4* 3.1* 3.9 3.2* 4.1 4.1  CL 93* 93* 93*  --  93*  --  97*  CO2 33* 36* 38*  --  34*  --  30  GLUCOSE 149* 166* 105*  --  108*  --  112*  BUN 28* 23* 21*  --  21*  --  31*  CREATININE 1.26* 1.22 1.30*  --  1.37*  --  1.39*  CALCIUM 7.9* 8.0* 7.9*  --  7.8*  --  8.1*  MG 1.9 2.1  --  2.0  --  1.8  --    GFR: Estimated Creatinine Clearance: 58.7 mL/min (A) (by C-G formula based on SCr of 1.39 mg/dL (H)).   Recent Results (from the past 240 hour(s))  Culture, blood (Routine X 2)  w Reflex to ID Panel     Status: None   Collection Time: 12/16/16  4:19 PM  Result Value Ref Range Status   Specimen Description BLOOD LEFT WRIST  Final   Special Requests   Final    BOTTLES DRAWN AEROBIC AND ANAEROBIC Blood Culture adequate volume   Culture NO GROWTH 5 DAYS  Final   Report Status 12/21/2016 FINAL  Final  Culture, blood (Routine X 2) w Reflex to ID Panel     Status: None   Collection Time: 12/16/16  5:00 PM  Result Value Ref Range Status   Specimen Description BLOOD LEFT HAND  Final   Special Requests   Final    BOTTLES DRAWN AEROBIC AND ANAEROBIC Blood Culture adequate volume   Culture NO GROWTH 5 DAYS  Final   Report Status 12/21/2016 FINAL  Final  MRSA PCR Screening     Status: None   Collection Time: 12/16/16 11:34 PM  Result Value Ref Range Status   MRSA by PCR NEGATIVE NEGATIVE Final    Comment:        The GeneXpert MRSA Assay (FDA approved for NASAL specimens only), is one component of a comprehensive MRSA colonization surveillance program. It is not intended to diagnose MRSA infection nor to guide or monitor treatment for MRSA infections.   Respiratory Panel by PCR     Status: None   Collection Time: 12/16/16 11:34 PM  Result Value Ref Range Status   Adenovirus NOT DETECTED NOT DETECTED Final   Coronavirus 229E NOT DETECTED NOT DETECTED Final   Coronavirus HKU1 NOT DETECTED NOT DETECTED Final   Coronavirus NL63 NOT DETECTED NOT DETECTED Final   Coronavirus OC43 NOT DETECTED NOT DETECTED Final   Metapneumovirus NOT DETECTED NOT DETECTED Final   Rhinovirus / Enterovirus NOT DETECTED NOT DETECTED Final   Influenza A NOT DETECTED NOT DETECTED Final   Influenza B NOT DETECTED NOT DETECTED Final   Parainfluenza Virus 1 NOT DETECTED NOT DETECTED Final   Parainfluenza Virus 2 NOT DETECTED NOT DETECTED Final   Parainfluenza Virus 3 NOT DETECTED NOT DETECTED Final   Parainfluenza Virus 4 NOT DETECTED NOT DETECTED Final   Respiratory Syncytial Virus NOT  DETECTED NOT DETECTED Final   Bordetella pertussis NOT DETECTED NOT DETECTED Final   Chlamydophila pneumoniae NOT DETECTED NOT DETECTED Final  Mycoplasma pneumoniae NOT DETECTED NOT DETECTED Final  Culture, sputum-assessment     Status: None   Collection Time: 12/16/16 11:57 PM  Result Value Ref Range Status   Specimen Description Expect. Sput  Final   Special Requests Normal  Final   Sputum evaluation THIS SPECIMEN IS ACCEPTABLE FOR SPUTUM CULTURE  Final   Report Status 12/17/2016 FINAL  Final  Culture, respiratory (NON-Expectorated)     Status: None   Collection Time: 12/16/16 11:57 PM  Result Value Ref Range Status   Specimen Description Expect. Sput  Final   Special Requests Normal Reflexed from [id number]  Final   Gram Stain   Final    ABUNDANT WBC PRESENT, PREDOMINANTLY PMN FEW SQUAMOUS EPITHELIAL CELLS PRESENT MODERATE GRAM NEGATIVE RODS FEW GRAM POSITIVE COCCI RARE GRAM POSITIVE RODS    Culture Consistent with normal respiratory flora.  Final   Report Status 12/19/2016 FINAL  Final  Culture, Urine     Status: None   Collection Time: 12/24/16  4:44 PM  Result Value Ref Range Status   Specimen Description URINE, CLEAN CATCH  Final   Special Requests NONE  Final   Culture NO GROWTH  Final   Report Status 12/25/2016 FINAL  Final     Scheduled Meds: . aspirin EC  81 mg Oral Daily  . carvedilol  12.5 mg Oral BID WC  . dextromethorphan-guaiFENesin  1 tablet Oral BID  . enoxaparin (LOVENOX) injection  60 mg Subcutaneous Daily  . furosemide  80 mg Intravenous BID  . ipratropium-albuterol  3 mL Nebulization TID  . mometasone-formoterol  2 puff Inhalation BID  . pantoprazole  40 mg Oral Daily  . potassium chloride  40 mEq Oral Daily  . predniSONE  60 mg Oral Q breakfast  . propafenone  300 mg Oral BID  . rosuvastatin  10 mg Oral QHS  . tetrahydrozoline  1 drop Both Eyes Daily     LOS: 10 days   Cherene Altes, MD [facility] Office  [phone] Pager -  Text Page per Amion as per below:  On-Call/Text Page:      [email or web address]      password [email or web address]  If 7PM-7AM, please contact night-coverage [email or web address] Password [email or web address] 12/26/2016, 3:59 PM

## 2016-12-26 NOTE — Progress Notes (Signed)
Pt tolerating BiPAP well and resting comfortably.

## 2016-12-26 NOTE — Plan of Care (Signed)
Problem: Education: Goal: Knowledge of [facility] Education information/materials will improve Outcome: Progressing Discussed plan of care.  Problem: Safety: Goal: Ability to remain free from injury will improve Outcome: Progressing Call light and personal items within reach.  BSC at bedside.  Pt reminded to call staff for assistance.  Problem: Physical Regulation: Goal: Ability to maintain clinical measurements within normal limits will improve Outcome: Progressing VSS

## 2016-12-26 NOTE — Progress Notes (Addendum)
Patient ambulated down hall one time, on 10 lpm HFNC. Was on 14 lpm HFNC before. Heart rate went up to 100-105s then back to 90s. Patient just says he feels stuffy. Is on PRN Afrin nasal spray, which he says helps some. Currently resting, still on 10 lpm HFNC. Updated doctor. Will continue to monitor.

## 2016-12-27 ENCOUNTER — Inpatient Hospital Stay (HOSPITAL_COMMUNITY): Payer: Medicare HMO

## 2016-12-27 MED ORDER — PREDNISONE 20 MG PO TABS
40.0000 mg | ORAL_TABLET | Freq: Every day | ORAL | Status: DC
  Administered 2016-12-28 – 2016-12-30 (×3): 40 mg via ORAL
  Filled 2016-12-27 (×3): qty 2

## 2016-12-27 MED ORDER — FUROSEMIDE 10 MG/ML IJ SOLN
60.0000 mg | Freq: Two times a day (BID) | INTRAMUSCULAR | Status: DC
  Administered 2016-12-27 – 2016-12-28 (×2): 60 mg via INTRAVENOUS
  Filled 2016-12-27 (×2): qty 6

## 2016-12-27 NOTE — Progress Notes (Signed)
[place] TEAM 1 - Stepdown/ICU TEAM  Chad Hogan  BTD:[id number] DOB: 1941-04-02 DOA: 12/16/2016 PCP: Myer Peer, MD    Brief Narrative:  76 y.o.M Hx Anxiety, HTN, Ischemic Heart Disease, SVT secondary to AV re-entrant tachycardia, NSVT, Wolff-Parkinson-White syndrome, COPD (on 3 L O2 at home), HLD, and OSA on CPAP who presented with 1 week of cough and shortness of breath.  EMS reported he was satting 53% on RA initially.   During ER stay his CXR was suggestive of PNA, and he was noted to have a run of VT.   Subjective: Making some progress w/ weaning of O2 w/ pt now on 8L HFNC, down from 10 yesterday.  No new complaints, reporting that he feels a good bit better today.    Assessment & Plan:  Acute hypoxic respiratory failure and sepsis due to multifocal pneumonia and acute COPD exacerbation Continues to require high-level oxygen support - has completed a course of empiric abx - cont to wean O2 as able - f/u CXR w/o signif change - use IS - up in chair as much as possible - making progress  Acute renal failure Baseline creatinine reportedly 1.1 - follow trend w/ ongoing attempts to diurese - renal fxn may soon prove to be limiting issue - cont diuresis for now   Recent Labs Lab 12/21/16 0142 12/22/16 0336 12/24/16 0215 12/26/16 0340  CREATININE 1.22 1.30* 1.37* 1.39*     Chronic diastolic congestive heart failure Cont in attempts to diurese - net negative ~9.3L - we appear to be holding steady on our present diuretic dose w/o loss or gain - follow renal fxn   Filed Weights   12/24/16 0607 12/25/16 0411 12/27/16 0457  Weight: 120.5 kg (265 lb 11.2 oz) 120.4 kg (265 lb 6.4 oz) 120.3 kg (265 lb 3.2 oz)    Wolff-Parkinson-White syndrome  4.2 cm ascending thoracic aortic aneurysm Will need annual monitoring   Hypokalemia Due to diuretic w/ poor intake - corrected   HTN BP controlled   OSA  Obesity - Body mass index is 36.99 kg/m.   DVT prophylaxis:  Lovenox Code Status: FULL CODE Family Communication: no family present at time of exam  Disposition Plan: ambulate - wean O2 as able - pt lives alone in [place] so he will need to be independent prior to d/c   Consultants:  None  Procedures: 9/9 TTE EF 60-65% - grade 1 diastolic dysfunction  Antimicrobials:  Zosyn 9/5 > 9/12 Vancomycin 9/5 > 9/10  Objective: Blood pressure 131/69, pulse 69, temperature 98.2 F (36.8 C), temperature source Oral, resp. rate 19, height 5\' 11"  (1.803 m), weight 120.3 kg (265 lb 3.2 oz), SpO2 93 %.  Intake/Output Summary (Last 24 hours) at 12/27/16 1558 Last data filed at 12/27/16 1532  Gross per 24 hour  Intake              940 ml  Output             1725 ml  Net             -785 ml   Filed Weights   12/24/16 0607 12/25/16 0411 12/27/16 0457  Weight: 120.5 kg (265 lb 11.2 oz) 120.4 kg (265 lb 6.4 oz) 120.3 kg (265 lb 3.2 oz)    Examination: General: alert and conversant  Lungs: distant breath sounds - fine crackles diffusely - still no wheezing  Cardiovascular: RRR - very distant HS - no M Abdomen: Nontender, morbidly obese, soft,  bowel sounds positive Extremities: 1+ B LE edema w/o signif change   CBC:  Recent Labs Lab 12/21/16 0142 12/22/16 0336  WBC 13.4* 13.0*  HGB 14.4 15.0  HCT 45.5 46.4  MCV 92.7 92.4  PLT 182 867   Basic Metabolic Panel:  Recent Labs Lab 12/21/16 0142 12/22/16 0336 12/22/16 1423 12/24/16 0215 12/24/16 1308 12/26/16 0340  NA 138 137  --  136  --  135  K 3.4* 3.1* 3.9 3.2* 4.1 4.1  CL 93* 93*  --  93*  --  97*  CO2 36* 38*  --  34*  --  30  GLUCOSE 166* 105*  --  108*  --  112*  BUN 23* 21*  --  21*  --  31*  CREATININE 1.22 1.30*  --  1.37*  --  1.39*  CALCIUM 8.0* 7.9*  --  7.8*  --  8.1*  MG 2.1  --  2.0  --  1.8  --    GFR: Estimated Creatinine Clearance: 58.7 mL/min (A) (by C-G formula based on SCr of 1.39 mg/dL (H)).   Recent Results (from the past 240 hour(s))  Culture, Urine      Status: None   Collection Time: 12/24/16  4:44 PM  Result Value Ref Range Status   Specimen Description URINE, CLEAN CATCH  Final   Special Requests NONE  Final   Culture NO GROWTH  Final   Report Status 12/25/2016 FINAL  Final     Scheduled Meds: . aspirin EC  81 mg Oral Daily  . carvedilol  12.5 mg Oral BID WC  . dextromethorphan-guaiFENesin  1 tablet Oral BID  . enoxaparin (LOVENOX) injection  60 mg Subcutaneous Daily  . fluticasone  2 spray Each Nare Daily  . furosemide  80 mg Intravenous BID  . hydrocortisone  25 mg Rectal BID  . ipratropium-albuterol  3 mL Nebulization TID  . mometasone-formoterol  2 puff Inhalation BID  . pantoprazole  40 mg Oral Daily  . potassium chloride  40 mEq Oral Daily  . predniSONE  60 mg Oral Q breakfast  . propafenone  300 mg Oral BID  . rosuvastatin  10 mg Oral QHS  . tetrahydrozoline  1 drop Both Eyes Daily  . tiotropium  18 mcg Inhalation Daily     LOS: 11 days   Cherene Altes, MD [facility] Office  [phone] Pager - Text Page per Amion as per below:  On-Call/Text Page:      [email or web address]      password [email or web address]  If 7PM-7AM, please contact night-coverage [email or web address] Password [email or web address] 12/27/2016, 3:58 PM

## 2016-12-28 LAB — BASIC METABOLIC PANEL
Anion gap: 8 (ref 5–15)
BUN: 34 mg/dL — AB (ref 6–20)
CHLORIDE: 95 mmol/L — AB (ref 101–111)
CO2: 33 mmol/L — ABNORMAL HIGH (ref 22–32)
Calcium: 8.3 mg/dL — ABNORMAL LOW (ref 8.9–10.3)
Creatinine, Ser: 1.45 mg/dL — ABNORMAL HIGH (ref 0.61–1.24)
GFR calc Af Amer: 52 mL/min — ABNORMAL LOW (ref >60)
GFR calc non Af Amer: 45 mL/min — ABNORMAL LOW (ref >60)
GLUCOSE: 137 mg/dL — AB (ref 65–99)
POTASSIUM: 4 mmol/L (ref 3.5–5.1)
Sodium: 136 mmol/L (ref 135–145)

## 2016-12-28 MED ORDER — FUROSEMIDE 40 MG PO TABS
40.0000 mg | ORAL_TABLET | Freq: Two times a day (BID) | ORAL | Status: DC
  Administered 2016-12-29 – 2017-01-01 (×8): 40 mg via ORAL
  Filled 2016-12-28 (×8): qty 1

## 2016-12-28 MED ORDER — ALBUTEROL SULFATE (2.5 MG/3ML) 0.083% IN NEBU: 2.5000 mg | INHALATION_SOLUTION | RESPIRATORY_TRACT | Status: DC | PRN

## 2016-12-28 NOTE — Care Management Important Message (Signed)
Important Message  Patient Details  Name: Chad Hogan MRN: [id number] Date of Birth: [birth year]   Medicare Important Message Given:  Yes    Nathen May 12/28/2016, 10:47 AM

## 2016-12-28 NOTE — Progress Notes (Signed)
[place] TEAM 1 - Stepdown/ICU TEAM  Chad Hogan  NOM:[id number] DOB: [birth year] DOA: 12/16/2016 PCP: Myer Peer, MD    Brief Narrative:  76 y.o.M Hx Anxiety, HTN, Ischemic Heart Disease, SVT secondary to AV re-entrant tachycardia, NSVT, Wolff-Parkinson-White syndrome, COPD (on 3 L O2 at home), HLD, and OSA on CPAP who presented with 1 week of cough and shortness of breath.  EMS reported he was satting 53% on RA initially.   During ER stay his CXR was suggestive of PNA, and he was noted to have a run of VT.   Subjective: Continues to make progress w/ O2 now weaned down to 5L HFNC.  No sob today, but is reporting some lightheadedness/orthostatic type sx.  Is not hypotensive while sitting.  Denies cp, n/v, or abdom pain.    Assessment & Plan:  Acute hypoxic respiratory failure and sepsis due to multifocal pneumonia and acute COPD exacerbation Continues to require high-level oxygen support but we are making progress in weaning - has completed a course of empiric abx - cont to wean O2 as able - f/u CXR w/o signif change - use IS - up in chair as much as possible - his [facility] agency can provide 5L HFNC O2 at home - if stable in AM 9/18 w/ resolution of dizziness/lightheadedness could be d/c home   Acute renal failure Baseline creatinine reportedly 1.1 - no further diuretic today - resume lasix tmrw via oral route at lower dose - BMET in AM    Recent Labs Lab 12/22/16 0336 12/24/16 0215 12/26/16 0340 12/28/16 0158  CREATININE 1.30* 1.37* 1.39* 1.45*     Chronic diastolic congestive heart failure Cont in attempts to diurese - net negative ~11L - weights are not changing, causing me to question their accuracy - see discussion above regarding climbing creatinine   Filed Weights   12/25/16 0411 12/27/16 0457 12/28/16 0500  Weight: 120.4 kg (265 lb 6.4 oz) 120.3 kg (265 lb 3.2 oz) 120 kg (264 lb 8 oz)    Wolff-Parkinson-White syndrome  4.2 cm ascending thoracic aortic aneurysm Will  need annual monitoring   Hypokalemia Due to diuretic w/ poor intake - corrected and stable   HTN BP controlled w/ extensive diuresis   OSA  Obesity - Body mass index is 36.89 kg/m.   DVT prophylaxis: Lovenox Code Status: FULL CODE Family Communication: no family present at time of exam  Disposition Plan: ambulate - wean O2 as able - pt lives alone in [place] so he will need to be independent prior to d/c - if stable in AM 9/18 w/ resolution of dizziness/lightheadedness could be d/c home   Consultants:  None  Procedures: 9/9 TTE EF 60-65% - grade 1 diastolic dysfunction  Antimicrobials:  Zosyn 9/5 > 9/12 Vancomycin 9/5 > 9/10  Objective: Blood pressure 124/62, pulse 72, temperature 97.8 F (36.6 C), temperature source Oral, resp. rate 20, height 5\' 11"  (1.803 m), weight 120 kg (264 lb 8 oz), SpO2 97 %.  Intake/Output Summary (Last 24 hours) at 12/28/16 1518 Last data filed at 12/28/16 1414  Gross per 24 hour  Intake              720 ml  Output             1899 ml  Net            -1179 ml   Filed Weights   12/25/16 0411 12/27/16 0457 12/28/16 0500  Weight: 120.4 kg (265 lb 6.4  oz) 120.3 kg (265 lb 3.2 oz) 120 kg (264 lb 8 oz)    Examination: General: alert and conversant - no acute distress  Lungs: distant breath sounds - no focal crackles - no wheezing  Cardiovascular: RRR - very distant HS  Abdomen: Nontender, morbidly obese, soft, bowel sounds positive Extremities: trace B LE edema w/o signif change   CBC:  Recent Labs Lab 12/22/16 0336  WBC 13.0*  HGB 15.0  HCT 46.4  MCV 92.4  PLT 161   Basic Metabolic Panel:  Recent Labs Lab 12/22/16 0336 12/22/16 1423 12/24/16 0215 12/24/16 1308 12/26/16 0340 12/28/16 0158  NA 137  --  136  --  135 136  K 3.1* 3.9 3.2* 4.1 4.1 4.0  CL 93*  --  93*  --  97* 95*  CO2 38*  --  34*  --  30 33*  GLUCOSE 105*  --  108*  --  112* 137*  BUN 21*  --  21*  --  31* 34*  CREATININE 1.30*  --  1.37*  --   1.39* 1.45*  CALCIUM 7.9*  --  7.8*  --  8.1* 8.3*  MG  --  2.0  --  1.8  --   --    GFR: Estimated Creatinine Clearance: 56.2 mL/min (A) (by C-G formula based on SCr of 1.45 mg/dL (H)).   Recent Results (from the past 240 hour(s))  Culture, Urine     Status: None   Collection Time: 12/24/16  4:44 PM  Result Value Ref Range Status   Specimen Description URINE, CLEAN CATCH  Final   Special Requests NONE  Final   Culture NO GROWTH  Final   Report Status 12/25/2016 FINAL  Final     Scheduled Meds: . aspirin EC  81 mg Oral Daily  . carvedilol  12.5 mg Oral BID WC  . dextromethorphan-guaiFENesin  1 tablet Oral BID  . enoxaparin (LOVENOX) injection  60 mg Subcutaneous Daily  . fluticasone  2 spray Each Nare Daily  . [START ON 12/29/2016] furosemide  40 mg Oral BID  . hydrocortisone  25 mg Rectal BID  . mometasone-formoterol  2 puff Inhalation BID  . pantoprazole  40 mg Oral Daily  . potassium chloride  40 mEq Oral Daily  . predniSONE  40 mg Oral Q breakfast  . propafenone  300 mg Oral BID  . rosuvastatin  10 mg Oral QHS  . tetrahydrozoline  1 drop Both Eyes Daily  . tiotropium  18 mcg Inhalation Daily     LOS: 12 days   Cherene Altes, MD [facility] Office  [phone] Pager - Text Page per Amion as per below:  On-Call/Text Page:      [email or web address]      password [email or web address]  If 7PM-7AM, please contact night-coverage [email or web address] Password [facility] 12/28/2016, 3:18 PM

## 2016-12-28 NOTE — Progress Notes (Signed)
Occupational Therapy Treatment Patient Details Name: Chad Hogan MRN: [id number] DOB: 1940/08/26 Today's Date: 12/28/2016    History of present illness Patient is a 76 yo male admitted 9/5 with SOB and PNA. 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    OT comments  Pt making progress with functional goals and OT will continue to follow acutely  Follow Up Recommendations  No OT follow up    Equipment Recommendations  None recommended by OT    Recommendations for Other Services      Precautions / Restrictions Precautions Precautions: Other (comment) Precaution Comments: watch 02 sats Restrictions Weight Bearing Restrictions: No       Mobility Bed Mobility Overal bed mobility: Modified Independent             General bed mobility comments: Pt at EOB upon arrival  Transfers Overall transfer level: Needs assistance Equipment used: None Transfers: Sit to/from Stand Sit to Stand: Modified independent (Device/Increase time)              Balance Overall balance assessment: Needs assistance Sitting-balance support: No upper extremity supported;Feet supported Sitting balance-Leahy Scale: Normal Sitting balance - Comments: Pt verbalizes the use of ADL A/E for LB energy conservation   Standing balance support: No upper extremity supported Standing balance-Leahy Scale: Good                             ADL either performed or assessed with clinical judgement   ADL Overall ADL's : Needs assistance/impaired     Grooming: Wash/dry hands;Wash/dry face;Brushing hair;Standing;Modified independent;Supervision/safety       Lower Body Bathing: Sit to/from stand;Min guard (simulated) Lower Body Bathing Details (indicate cue type and reason): verbalized use of LH sponge that he reports that he has at home, however sattes that she just takes a "whore's bath" at  home at sink     Lower Body Dressing: Sit to/from stand;With adaptive equipment;Min guard Lower Body Dressing Details (indicate cue type and reason): verbalized use of ADL A/E for LB dressing (sock aid, reacher) shown to him by previous OT, however pt states that he avoids socks and just wears slip on shoes Toilet Transfer: Ambulation;BSC;Supervision/safety;Modified Independent   Toileting- Clothing Manipulation and Hygiene: Sit to/from stand;Modified independent               Vision Baseline Vision/History: Wears glasses Patient Visual Report: No change from baseline     Perception     Praxis      Cognition Arousal/Alertness: Awake/alert Behavior During Therapy: WFL for tasks assessed/performed Overall Cognitive Status: Within Functional Limits for tasks assessed                                          Exercises     Shoulder Instructions       General Comments  pt pleasant and cooperative    Pertinent Vitals/ Pain       Pain Assessment: No/denies pain  Home Living                                          Prior Functioning/Environment  Frequency  Min 2X/week        Progress Toward Goals  OT Goals(current goals can now be found in the care plan section)  Progress towards OT goals: Progressing toward goals     Plan Discharge plan remains appropriate    Co-evaluation                 AM-PAC PT "6 Clicks" Daily Activity     Outcome Measure   Help from another person eating meals?: None Help from another person taking care of personal grooming?: None Help from another person toileting, which includes using toliet, bedpan, or urinal?: A Little Help from another person bathing (including washing, rinsing, drying)?: A Little Help from another person to put on and taking off regular upper body clothing?: None Help from another person to put on and taking off regular lower body clothing?: A  Little 6 Click Score: 21    End of Session Equipment Utilized During Treatment: Oxygen  OT Visit Diagnosis: Unsteadiness on feet (R26.81)   Activity Tolerance Patient tolerated treatment well   Patient Left in bed;with call bell/phone within reach   Nurse Communication      Functional Assessment Tool Used: AM-PAC 6 Clicks Daily Activity   Time: 1343-1406 OT Time Calculation (min): 23 min  Charges: OT G-codes **NOT FOR INPATIENT CLASS** Functional Assessment Tool Used: AM-PAC 6 Clicks Daily Activity OT General Charges $OT Visit: 1 Visit OT Treatments $Self Care/Home Management : 8-22 mins $Therapeutic Activity: 8-22 mins     Britt Bottom 12/28/2016, 2:35 PM

## 2016-12-28 NOTE — Progress Notes (Signed)
Physical Therapy Treatment Patient Details Name: Chad Hogan MRN: [id number] DOB: 21-Aug-1940 Today's Date: 12/28/2016    History of Present Illness Patient is a 76 yo male admitted 9/5 with SOB and PNA. 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     PT Comments    Pt progressing with mobility and functional activity tolerance. Today amb on 6L O2.    Follow Up Recommendations  Home health PT (to progress toward outpatient pulmonary rehab)     Equipment Recommendations  None recommended by PT    Recommendations for Other Services       Precautions / Restrictions Precautions Precautions: Other (comment) Precaution Comments: watch 02 sats Restrictions Weight Bearing Restrictions: No    Mobility  Bed Mobility Overal bed mobility: Modified Independent             General bed mobility comments: Pt at EOB upon arrival  Transfers Overall transfer level: Needs assistance Equipment used: None Transfers: Sit to/from Stand Sit to Stand: Modified independent (Device/Increase time)            Ambulation/Gait Ambulation/Gait assistance: Supervision Ambulation Distance (Feet): 175 Feet Assistive device:  (pushing O2) Gait Pattern/deviations: Step-through pattern;Decreased stride length Gait velocity: decr Gait velocity interpretation: Below normal speed for age/gender General Gait Details: Pt amb on 6L O2 via HFNC with SpO2 89-90%. Pt with one standing rest break   Stairs            Wheelchair Mobility    Modified Rankin (Stroke Patients Only)       Balance Overall balance assessment: Needs assistance Sitting-balance support: No upper extremity supported;Feet supported Sitting balance-Leahy Scale: Normal     Standing balance support: No upper extremity supported Standing balance-Leahy Scale: Good                              Cognition  Arousal/Alertness: Awake/alert Behavior During Therapy: WFL for tasks assessed/performed Overall Cognitive Status: Within Functional Limits for tasks assessed                                        Exercises      General Comments        Pertinent Vitals/Pain Pain Assessment: No/denies pain    Home Living                      Prior Function            PT Goals (current goals can now be found in the care plan section) Progress towards PT goals: Progressing toward goals    Frequency    Min 3X/week      PT Plan Current plan remains appropriate    Co-evaluation              AM-PAC PT "6 Clicks" Daily Activity  Outcome Measure  Difficulty turning over in bed (including adjusting bedclothes, sheets and blankets)?: None Difficulty moving from lying on back to sitting on the side of the bed? : A Little Difficulty sitting down on and standing up from a chair with arms (e.g., wheelchair, bedside commode, etc,.)?: A Little Help needed moving to and from a bed to chair (including a wheelchair)?: None Help needed walking in hospital room?: None Help needed climbing  3-5 steps with a railing? : A Little 6 Click Score: 21    End of Session Equipment Utilized During Treatment: Oxygen Activity Tolerance: Patient limited by fatigue Patient left: in bed;with call bell/phone within reach (Siting EOB)   PT Visit Diagnosis: Difficulty in walking, not elsewhere classified (R26.2)     Time: 0488-8916 PT Time Calculation (min) (ACUTE ONLY): 13 min  Charges:  $Gait Training: 8-22 mins                    G Codes:       [facility] PT [place] 12/28/2016, 1:06 PM

## 2016-12-29 LAB — CBC
HCT: 43.4 % (ref 39.0–52.0)
Hemoglobin: 13.6 g/dL (ref 13.0–17.0)
MCH: 29.2 pg (ref 26.0–34.0)
MCHC: 31.3 g/dL (ref 30.0–36.0)
MCV: 93.3 fL (ref 78.0–100.0)
PLATELETS: 182 10*3/uL (ref 150–400)
RBC: 4.65 MIL/uL (ref 4.22–5.81)
RDW: 13.9 % (ref 11.5–15.5)
WBC: 10 10*3/uL (ref 4.0–10.5)

## 2016-12-29 LAB — BASIC METABOLIC PANEL
Anion gap: 7 (ref 5–15)
BUN: 34 mg/dL — ABNORMAL HIGH (ref 6–20)
CALCIUM: 8.5 mg/dL — AB (ref 8.9–10.3)
CO2: 32 mmol/L (ref 22–32)
CREATININE: 1.37 mg/dL — AB (ref 0.61–1.24)
Chloride: 97 mmol/L — ABNORMAL LOW (ref 101–111)
GFR calc non Af Amer: 48 mL/min — ABNORMAL LOW (ref >60)
GFR, EST AFRICAN AMERICAN: 56 mL/min — AB (ref >60)
Glucose, Bld: 119 mg/dL — ABNORMAL HIGH (ref 65–99)
Potassium: 4.2 mmol/L (ref 3.5–5.1)
SODIUM: 136 mmol/L (ref 135–145)

## 2016-12-29 NOTE — Progress Notes (Signed)
Physical Therapy Treatment Patient Details Name: Chad Hogan MRN: [id number] DOB: 07-05-1940 Today's Date: 12/29/2016    History of Present Illness Patient is a 76 yo male admitted 9/5 with SOB and PNA. 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     PT Comments    Patient progressing slowly towards PT goals. Tolerated ambulation on 4L/min 02 but SP02 ranged from 86-90%. Emphasized importance of rest breaks, energy conservation techniques, pursed lip breathing and activity modifications at home. Able to maintain SP02 in mid 90s at rest on 3L.min 02. Encouraged pt to increase activity level while in hospital as pt reports he feels "fuzzy headed" during activity. Will continue to follow.    Follow Up Recommendations  Home health PT (progressing to outpatient pulmonary rehab)     Equipment Recommendations  None recommended by PT    Recommendations for Other Services       Precautions / Restrictions Precautions Precautions: Other (comment) Precaution Comments: watch 02 sats Restrictions Weight Bearing Restrictions: No    Mobility  Bed Mobility Overal bed mobility: Modified Independent                Transfers Overall transfer level: Needs assistance Equipment used: None Transfers: Sit to/from Stand Sit to Stand: Modified independent (Device/Increase time)            Ambulation/Gait Ambulation/Gait assistance: Supervision Ambulation Distance (Feet): 150 Feet Assistive device:  (pushing 02 tank) Gait Pattern/deviations: Step-through pattern;Decreased stride length Gait velocity: decr Gait velocity interpretation: Below normal speed for age/gender General Gait Details: Amb on 4L/min 02 with SP02 ranging from 86-90%. 2 standing rest breaks. Cues for pursed lip breathing.    Stairs            Wheelchair Mobility    Modified Rankin (Stroke Patients  Only)       Balance Overall balance assessment: Needs assistance Sitting-balance support: No upper extremity supported;Feet supported Sitting balance-Leahy Scale: Normal     Standing balance support: No upper extremity supported Standing balance-Leahy Scale: Good                              Cognition Arousal/Alertness: Awake/alert Behavior During Therapy: WFL for tasks assessed/performed Overall Cognitive Status: Within Functional Limits for tasks assessed                                 General Comments: Patient needed reinforcement on the importance of paying attention to O2 reading despite a lack of symptoms.      Exercises      General Comments General comments (skin integrity, edema, etc.): BP mildly elevated after walking 178/70s. Sp02 95% on 3L/min 02 at rest. drops during mobility.      Pertinent Vitals/Pain Pain Assessment: No/denies pain    Home Living                      Prior Function            PT Goals (current goals can now be found in the care plan section) Progress towards PT goals: Progressing toward goals    Frequency    Min 3X/week      PT Plan Current plan remains appropriate    Co-evaluation  AM-PAC PT "6 Clicks" Daily Activity  Outcome Measure  Difficulty turning over in bed (including adjusting bedclothes, sheets and blankets)?: None Difficulty moving from lying on back to sitting on the side of the bed? : None Difficulty sitting down on and standing up from a chair with arms (e.g., wheelchair, bedside commode, etc,.)?: None Help needed moving to and from a bed to chair (including a wheelchair)?: None Help needed walking in hospital room?: None Help needed climbing 3-5 steps with a railing? : A Little 6 Click Score: 23    End of Session Equipment Utilized During Treatment: Oxygen Activity Tolerance: Patient limited by fatigue Patient left: in bed Nurse Communication:   (sitting EOB.) PT Visit Diagnosis: Difficulty in walking, not elsewhere classified (R26.2)     Time: 4469-5072 PT Time Calculation (min) (ACUTE ONLY): 13 min  Charges:  $Therapeutic Exercise: 8-22 mins                    G Codes:       Wray Kearns, PT, DPT [place] 12/29/2016, 9:37 AM

## 2016-12-29 NOTE — Progress Notes (Signed)
Patient on BIPAP for HS rest per order. Tolerating well.

## 2016-12-29 NOTE — Progress Notes (Signed)
CSW gave SNF bed offers to patient. Patient chooses [name]. CSW started [facility] for chosen SNF. CSW to follow and support with discharge to SNF.  Estanislado Emms, [place]

## 2016-12-29 NOTE — Progress Notes (Signed)
PROGRESS NOTE    Chad Hogan  UYQ:[id number] DOB: Jan 05, 1941 DOA: 12/16/2016 PCP: Myer Peer, MD   Brief Narrative:  76 y.o. WM PMHx Anxiety, HTN, Ischemic Heart Disease, SVT secondary to antidromic AV reentrant tachycardia, NSVT, Wolff-Parkinson-White syndrome, COPD (on 3 L O2 at home), HLD, OSA on CPAP,   Presented with cough and shortness of breath this been going on for past 1 week patient today called  EMS with me Atrovent 125 of Solu-Medrol on EMS report initially satting 53% in room air but improved to 92% nebulizer treatment. He reports sputum productive of green sputum wheezing, no sick contacts, subjective fever and chills, no chest pain Patient required up to 5 L of oxygen currently satting 87% chest x-ray worrisome for pneumonia. During ER stay patient was noted to have a run of VT. hemodynamically stable currently resolved   Regarding pertinent Chronic problems: Hx of SVT secondary to antidromic AV reentrant tachycardia sp unsuccessful ablation, responded well to propafenone,     Subjective: 9/18 A/O 4, negative CP, positive acute on chronic SOB, positive DOE, positive dizziness with ambulation, reports feeling fuzzy headed. Negative abdominal pain, negative N/V         Assessment & Plan:   Active Problems:   Essential hypertension   WOLFF (WOLFE)-PARKINSON-WHITE (WPW) SYNDROME   Sleep apnea   Chronic diastolic heart failure (HCC)   CAP (community acquired pneumonia)   Dehydration   CKD (chronic kidney disease), stage III   Acute on chronic respiratory failure with hypoxia (HCC)   NSVT (nonsustained ventricular tachycardia) (HCC)   Sepsis (HCC)   Hypocalcemia   COPD exacerbation (HCC)     COPD Exacerbation/Multifocal Pneumonia/Sepsis CAP   -Completed course antibiotics -Patient still requiring high levels of O2 for support. -Patient still lightheaded/dizzy with ambulation  -Mucinex DM BID -Flutter valve -Dulera BID -Prednisone 40 mg daily. Continue  slow taper . -Titrate O2 to maintain SPO2 89-93% -Follow-up Dr. Chesley Noon PC CM in 2-4 weeks. Schedule appointment for discharge  Acute on chronic respiratory failure with hypoxia - 3 L O2 via [place] at home -See COPD exacerbation -Echocardiogram: Consistent with diastolic CHF see results below  -CT chest PE protocol: Consistent with multifocal pneumonia -negative PE  -9/13 PCXR: No significant change   -Patient instructed 3 L O2 when sedentary: Titrate up to 6 L O2 on exertion to maintain SPO2 89-93%   Acute renal failure (baseline Cr 1.1)  Lab Results  Component Value Date   CREATININE 1.37 (H) 12/29/2016   CREATININE 1.45 (H) 12/28/2016   CREATININE 1.39 (H) 12/26/2016  - Cr stable continue to monitor closely  Chronic diastolic CHF (unknown base weight) -Strict I&OSince admission -10.4 L -Daily weight Filed Weights   12/27/16 0457 12/28/16 0500 12/29/16 0521  Weight: 265 lb 3.2 oz (120.3 kg) 264 lb 8 oz (120 kg) 264 lb 6.4 oz (119.9 kg)  -Coreg 12.5 mg BID -Lasix 80 mg BID. -TED hose knee-high  Wolff-Parkinson-White syndrome -Currently in NSR -Propafenone 300 mg BID  Essential hypertension -See CHF  Ascending thoracic aortic aneurysm -9/9 CT abdomen/pelvis: Shows 4.2 cm aneurysm see results below -Requires annual imaging  Hypocalcemia  Hypokalemia -Potassium goal> 4   OSA/OHS -CPAP per respiratory   Goals of GLOV@@@@ -Patient continues to have dizziness on exertion, since he lives alone with no support system not safe for discharge to home. -9/18 reconsulted PT/OT evaluate for SNF  vs CIR. Have also spoken with LCSW Estanislado Emms concerning patient's need for placement.  -  Patient agreeable for short-term stay at SNF until he recovers strength.      DVT prophylaxis: Lovenox Code Status: Full Family Communication: None Disposition Plan: TBD   Consultants:  None    Procedures/Significant Events:  9/9 CT angiogram chest PE protocol: -Negative PE  . -4.2 cm Ascending Thoracic Aortic Aneurysm 4.2 cm ascending thoracic aortic aneurysm.   -Multifocal airspace opacities bilateral lung: Worse bibasilar and RUL pneumonia  vs atelectasis  9/9 Echocardiogram: LVEF= 60-65%.-Grade 1 diastolic dysfunction   I have personally reviewed and interpreted all radiology studies and my findings are as above.  VENTILATOR SETTINGS: None   Cultures 9/5 blood NGTD 9/5 MRSA by PCR negative 9/5 sputum normal flora 9/5 respiratory virus panel negative 9/5 HIV negative     Antimicrobials:Anti-infectives    Start     Stop   12/17/16 0900  vancomycin (VANCOCIN) IVPB 1000 mg/200 mL premix  Status:  Discontinued     12/21/16 1609   12/17/16 0300  piperacillin-tazobactam (ZOSYN) IVPB 3.375 g  Status:  Discontinued     12/23/16 1441   12/16/16 2030  piperacillin-tazobactam (ZOSYN) IVPB 3.375 g     12/16/16 2053   12/16/16 2030  vancomycin (VANCOCIN) 2,500 mg in sodium chloride 0.9 % 500 mL IVPB     12/16/16 2250   12/16/16 1615  cefTRIAXone (ROCEPHIN) 1 g in dextrose 5 % 50 mL IVPB     12/16/16 1809   12/16/16 1615  azithromycin (ZITHROMAX) 500 mg in dextrose 5 % 250 mL IVPB     12/16/16 1923       Devices    LINES / TUBES:      Continuous Infusions:    Objective: Vitals:   12/29/16 0004 12/29/16 0008 12/29/16 0445 12/29/16 0521  BP: (!) 141/84   130/76  Pulse: 64 65 60 60  Resp: 19 (!) 21 20 20   Temp: 97.6 F (36.4 C)   97.8 F (36.6 C)  TempSrc: Axillary   Oral  SpO2: 94%  96% 98%  Weight:    264 lb 6.4 oz (119.9 kg)  Height:        Intake/Output Summary (Last 24 hours) at 12/29/16 1233 Last data filed at 12/29/16 0845  Gross per 24 hour  Intake              920 ml  Output              675 ml  Net              245 ml   Filed Weights   12/27/16 0457 12/28/16 0500 12/29/16 0521  Weight: 265 lb 3.2 oz (120.3 kg) 264 lb 8 oz (120 kg) 264 lb 6.4 oz (119.9 kg)   Physical Exam:  General: A/O 4, positive acute or  chronic respiratory failure, (patient still required significant O2 support)  Neck:  Negative scars, masses, torticollis, lymphadenopathy, JVD Lungs: Clear to auscultation bilaterally without wheezes. Bibasilar crackles Cardiovascular: Tachycardic, Regular rhythm without murmur gallop or rub normal S1 and S2 Abdomen: Morbidly obese, negative abdominal pain, nondistended, positive soft, bowel sounds, no rebound, no ascites, no appreciable mass Extremities: No significant cyanosis, clubbing, or edema bilateral lower extremities Psychiatric:  Negative depression, positive anxiety, negative fatigue, negative mania  Central nervous system:  Cranial nerves II through XII intact, tongue/uvula midline, all extremities muscle strength 5/5, sensation intact throughout, negative dysarthria, negative expressive aphasia, negative receptive aphasia.     .     Data Reviewed: Care during  the described time interval was provided by me .  I have reviewed this patient's available data, including medical history, events of note, physical examination, and all test results as part of my evaluation.   CBC:  Recent Labs Lab 12/29/16 0304  WBC 10.0  HGB 13.6  HCT 43.4  MCV 93.3  PLT 474   Basic Metabolic Panel:  Recent Labs Lab 12/22/16 1423 12/24/16 0215 12/24/16 1308 12/26/16 0340 12/28/16 0158 12/29/16 0304  NA  --  136  --  135 136 136  K 3.9 3.2* 4.1 4.1 4.0 4.2  CL  --  93*  --  97* 95* 97*  CO2  --  34*  --  30 33* 32  GLUCOSE  --  108*  --  112* 137* 119*  BUN  --  21*  --  31* 34* 34*  CREATININE  --  1.37*  --  1.39* 1.45* 1.37*  CALCIUM  --  7.8*  --  8.1* 8.3* 8.5*  MG 2.0  --  1.8  --   --   --    GFR: Estimated Creatinine Clearance: 59.5 mL/min (A) (by C-G formula based on SCr of 1.37 mg/dL (H)). Liver Function Tests: No results for input(s): AST, ALT, ALKPHOS, BILITOT, PROT, ALBUMIN in the last 168 hours. No results for input(s): LIPASE, AMYLASE in the last 168 hours. No  results for input(s): AMMONIA in the last 168 hours. Coagulation Profile: No results for input(s): INR, PROTIME in the last 168 hours. Cardiac Enzymes: No results for input(s): CKTOTAL, CKMB, CKMBINDEX, TROPONINI in the last 168 hours. BNP (last 3 results) No results for input(s): PROBNP in the last 8760 hours. HbA1C: No results for input(s): HGBA1C in the last 72 hours. CBG: No results for input(s): GLUCAP in the last 168 hours. Lipid Profile: No results for input(s): CHOL, HDL, LDLCALC, TRIG, CHOLHDL, LDLDIRECT in the last 72 hours. Thyroid Function Tests: No results for input(s): TSH, T4TOTAL, FREET4, T3FREE, THYROIDAB in the last 72 hours. Anemia Panel: No results for input(s): VITAMINB12, FOLATE, FERRITIN, TIBC, IRON, RETICCTPCT in the last 72 hours. Urine analysis:    Component Value Date/Time   COLORURINE YELLOW 12/24/2016 [place] 12/24/2016 1644   LABSPEC 1.005 12/24/2016 1644   PHURINE 7.0 12/24/2016 1644   GLUCOSEU NEGATIVE 12/24/2016 1644   HGBUR LARGE (A) 12/24/2016 1644   BILIRUBINUR NEGATIVE 12/24/2016 1644   KETONESUR NEGATIVE 12/24/2016 1644   PROTEINUR NEGATIVE 12/24/2016 1644   NITRITE NEGATIVE 12/24/2016 1644   LEUKOCYTESUR TRACE (A) 12/24/2016 1644   Sepsis Labs: @LABRCNTIP (procalcitonin:4,lacticidven:4)  ) Recent Results (from the past 240 hour(s))  Culture, Urine     Status: None   Collection Time: 12/24/16  4:44 PM  Result Value Ref Range Status   Specimen Description URINE, CLEAN CATCH  Final   Special Requests NONE  Final   Culture NO GROWTH  Final   Report Status 12/25/2016 FINAL  Final         Radiology Studies: No results found.      Scheduled Meds: . aspirin EC  81 mg Oral Daily  . carvedilol  12.5 mg Oral BID WC  . dextromethorphan-guaiFENesin  1 tablet Oral BID  . enoxaparin (LOVENOX) injection  60 mg Subcutaneous Daily  . fluticasone  2 spray Each Nare Daily  . furosemide  40 mg Oral BID  .  hydrocortisone  25 mg Rectal BID  . mometasone-formoterol  2 puff Inhalation BID  . pantoprazole  40 mg  Oral Daily  . potassium chloride  40 mEq Oral Daily  . predniSONE  40 mg Oral Q breakfast  . propafenone  300 mg Oral BID  . rosuvastatin  10 mg Oral QHS  . tetrahydrozoline  1 drop Both Eyes Daily  . tiotropium  18 mcg Inhalation Daily   Continuous Infusions:    LOS: 13 days    Time spent: 40 minutes    Tracy Gerken, Geraldo Docker, MD [facility] Pager [phone]   If 7PM-7AM, please contact night-coverage [email or web address] Password [facility] 12/29/2016, 12:33 PM

## 2016-12-29 NOTE — Progress Notes (Signed)
Pt is on BiPAP QHS at this time tolerating it well.

## 2016-12-29 NOTE — Clinical Social Work Note (Addendum)
Clinical Social Work Assessment  Patient Details  Name: Chad Hogan MRN: [id number] Date of Birth: 13-Jun-1940  Date of referral:  12/29/16               Reason for consult:  Facility Placement                Permission sought to share information with:  Facility Art therapist granted to share information::  Yes, Verbal Permission Granted  Name::        Agency::  SNFs  Relationship::     Contact Information:     Housing/Transportation Living arrangements for the past 2 months:  Single Family Home Source of Information:  Patient Patient Interpreter Needed:  None Criminal Activity/Legal Involvement Pertinent to Current Situation/Hospitalization:  No - Comment as needed Significant Relationships:  Adult Children, Siblings Lives with:  Self Do you feel safe going back to the place where you live?  Yes Need for family participation in patient care:  No (Coment)  Care giving concerns: Patient from home alone. Family lives nearby. MD recommending SNF due to patient oxygen saturations with activity.   Social Worker assessment / plan: CSW met with patient at bedside. Patient prefers to go home but agreeable to considering rehab. Patient agreeable for CSW to send out SNF referrals. Patient lives alone but family live nearby. Patient prefers facility in [facility] near his home. CSW sent out initial referrals. Bed offers pending and then will start authorization with [facility] for patient's chosen facility. CSW to follow and support with discharge.  Employment status:  Retired Forensic scientist:  [employer]) PT Recommendations:  [place] / Referral to community resources:  [place]  Patient/Family's Response to care: Patient appreciative of care.  Patient/Family's Understanding of and Emotional Response to Diagnosis, Current Treatment, and Prognosis: Patient with understanding of his medical conditions and  agreeable to ST SNF. Hopeful for timely recovery and return home.  Emotional Assessment Appearance:  Appears stated age Attitude/Demeanor/Rapport:  Other (appropriate) Affect (typically observed):  Calm Orientation:  Oriented to Self, Oriented to Place, Oriented to  Time, Oriented to Situation Alcohol / Substance use:  Not Applicable Psych involvement (Current and /or in the community):  No (Comment)  Discharge Needs  Concerns to be addressed:  Discharge Planning Concerns Readmission within the last 30 days:  No Current discharge risk:  Physical Impairment Barriers to Discharge:  Continued Medical Work up   Estanislado Emms, LCSW 12/29/2016, 12:01 PM

## 2016-12-29 NOTE — Plan of Care (Signed)
Problem: Physical Regulation: Goal: Ability to maintain clinical measurements within normal limits will improve Outcome: Progressing Patient on 5L HFNC, O2 Sat 90-93%. Placed onto CPAP for sleep, patient tolerating.   Problem: Bowel/Gastric: Goal: Will not experience complications related to bowel motility Outcome: Progressing BSC at bedside. Patient self administers suppository which he feels has been helping his steroids.

## 2016-12-29 NOTE — Progress Notes (Addendum)
Ambulated in hall x1, on 4 liters of oxygen. Patient stopped one time since he was a bit sob. Denied any dizziness. Ambulated back to room, satting 90-93%. Will continue to monitor.

## 2016-12-29 NOTE — Discharge Summary (Signed)
Physician Discharge Summary  Chad Hogan OEV:[id number] DOB: [birth year] DOA: 12/16/2016  PCP: Myer Peer, MD  Admit date: 12/16/2016 Discharge date: 01/01/2017  Time spent: 40 minutes  Recommendations for Outpatient Follow-up:    COPD Exacerbation/Multifocal Pneumonia/Sepsis CAP   -Completed a seven-day course antibiotics -Patient's respiratory status Has improved but not back to baseline.   -Albuterol nebulizer q 4 PRN -Mucinex DM BID -Flutter valve -Advair BID -Prednisone 20 mg daily. Patient most likely will be steroid dependent, but will certainly need long taper. Would continue current dose until follow-up with [facility] -Titrate O2 to maintain SPO2 89-93% SATURATION QUALIFICATIONS: (This note is used to comply with regulatory documentation for home oxygen) Patient Saturations on 3L/min 02 at Rest = 94% Patient Saturations on 4L/min 02 while Ambulating = 86% Patient Saturations on 5L/min 02 Liters of oxygen while Ambulating = 88-89% Please briefly explain why patient needs home oxygen: Pt requires 4-6L/min 02 to maintain Sp02 in high 80s-low 90s during activity.  -Patient aware requires O2 24 hours a day, 3 L/min at rest and titrate up to 6 L/min with exertion. Maintain SPO2 89-93%   -Patient would benefit from 7-14 days of inpatient pulmonary rehabilitation, given his tenuous respiratory status unfortunately patient's insurance company has denied recommended course of treatment.  -Schedule follow-up with Dr. Jennet Maduro PC CM in 2-4 weeks COPD exacerbation, multifocal pneumonia, acute on chronic respiratory failure, overall declining respiratory status. Request spirometry pre-/post bronchodilation, DLCO    Acute on chronic respiratory failure with hypoxia - 3 L O2 via [place] at home -See COPD exacerbation -Echocardiogram: Consistent with diastolic CHF see results below  -CT chest PE protocol: Consistent with multifocal pneumonia -negative PE  -9/13 PCXR: No significant change      Acute renal failure (baseline Cr 1.1)  Recent Labs       Lab Results  Component Value Date    CREATININE 1.37 (H) 12/24/2016    CREATININE 1.30 (H) 12/22/2016    CREATININE 1.22 12/21/2016    -If Cr continues to Trend up decrease Lasix    Chronic diastolic CHF (unknown base weight) -Strict I&OSince admission -4.4 L -Daily weight      Filed Weights    12/22/16 0556 12/23/16 0500 12/24/16 0607  Weight: 269 lb 6.4 oz (122.2 kg) 270 lb (122.5 kg) 265 lb 11.2 oz (120.5 kg)  -Coreg 12.5 mg BID -Lasix 80 mg BID. -TED hose knee-high  -Schedule follow-up Dr. Crissie Sickles [facility] MG, in 1-2 weeks chronic diastolic CHF, Wolff-Parkinson-White syndrome, essential HTN   Wolff-Parkinson-White syndrome -Currently in NSR -Propafenone 300 mg BID   Essential hypertension -See CHF  -Schedule follow-up Dr. Ann Held 1-2 week essential hypertension, ascending thoracic aortic aneurysm, OSA/OHS,   Ascending thoracic aortic aneurysm -9/9 CT abdomen/pelvis: Shows 4.2 cm aneurysm see results below -Requires annual imaging   Hypocalcemia   Hypokalemia -Potassium goal> 4    OSA/OHS -CPAP per respiratory -Spoke at length with patient on requirement to use CPAP.  Patient has agreed to use at night.   -ABG in the a.m.    Discharge Diagnoses:  Active Problems:   Essential hypertension   WOLFF (WOLFE)-PARKINSON-WHITE (WPW) SYNDROME   Sleep apnea   Chronic diastolic heart failure (HCC)   CKD (chronic kidney disease), stage III   Acute on chronic respiratory failure with hypoxia (HCC)   NSVT (nonsustained ventricular tachycardia) (HCC)   Hypocalcemia   COPD with acute exacerbation (HCC)   Sepsis due to pneumonia (HCC)   Chronic diastolic  CHF (congestive heart failure) (HCC)   Wolff-Parkinson-White syndrome   Thoracic ascending aortic aneurysm (HCC)   Obesity hypoventilation syndrome (HCC)   OSA on CPAP   Discharge Condition: guarded  Diet recommendation: heart healthy  Filed  Weights   12/30/16 0512 12/31/16 0617 01/01/17 0552  Weight: 265 lb 12.8 oz (120.6 kg) 264 lb (119.7 kg) 262 lb (118.8 kg)    History of present illness:  76 y.o. WM PMHx Anxiety, HTN, Ischemic Heart Disease, SVT secondary to antidromic AV reentrant tachycardia, NSVT, Wolff-Parkinson-White syndrome, COPD (on 3 L O2 at home), HLD, OSA on CPAP,   Presented with cough and shortness of breath this been going on for past 1 week patient today called  EMS with me Atrovent 125 of Solu-Medrol on EMS report initially satting 53% in room air but improved to 92% nebulizer treatment. He reports sputum productive of green sputum wheezing, no sick contacts, subjective fever and chills, no chest pain Patient required up to 5 L of oxygen currently satting 87% chest x-ray worrisome for pneumonia. During ER stay patient was noted to have a run of VT. hemodynamically stable currently resolved  During his hospitalization patient chief of COPD exacerbation/multifocal pneumonia/sepsis with appropriate antibiotics and supportive care. Unfortunately patient's status complicated by acute on chronic respiratory failure with hypoxia and deconditioning which prohibited patient from returning to baseline.patient was significantly improved at time of discharge but felt unsafe for discharge to home. Myself, Physical therapy, and Occupational therapy all felt that Patient would benefit from 7-14 days of inpatient pulmonary rehabilitation, given his tenuous respiratory status unfortunately patient's insurance company has denied recommended course of treatment. Patient was forced to be discharged with suboptimal treatment plan secondary to insurance companies denial of recommended care.    Procedures: 9/9 CT angiogram chest PE protocol: -Negative PE . -4.2 cm Ascending Thoracic Aortic Aneurysm 4.2 cm ascending thoracic aortic aneurysm.   -Multifocal airspace opacities bilateral lung: Worse bibasilar and RUL pneumonia  vs atelectasis   9/9 Echocardiogram: LVEF= 60-65%.-Grade 1 diastolic dysfunction   Consultations: none  Cultures  9/5 blood NGTD 9/5 MRSA by PCR negative 9/5 sputum normal flora 9/5 respiratory virus panel negative 9/5 HIV negative   AntibioticsAnti-infectives    Start     Stop   12/17/16 0900  vancomycin (VANCOCIN) IVPB 1000 mg/200 mL premix  Status:  Discontinued     12/21/16 1609   12/17/16 0300  piperacillin-tazobactam (ZOSYN) IVPB 3.375 g  Status:  Discontinued     12/23/16 1441   12/16/16 2030  piperacillin-tazobactam (ZOSYN) IVPB 3.375 g     12/16/16 2053   12/16/16 2030  vancomycin (VANCOCIN) 2,500 mg in sodium chloride 0.9 % 500 mL IVPB     12/16/16 2250   12/16/16 1615  cefTRIAXone (ROCEPHIN) 1 g in dextrose 5 % 50 mL IVPB     12/16/16 1809   12/16/16 1615  azithromycin (ZITHROMAX) 500 mg in dextrose 5 % 250 mL IVPB     12/16/16 1923       Discharge Exam: Vitals:   01/01/17 0552 01/01/17 0810 01/01/17 0840 01/01/17 1300  BP:   (!) 141/78   Pulse:   (!) 58   Resp:    18  Temp:    98.7 F (37.1 C)  TempSrc:    Oral  SpO2:  93% 92% 95%  Weight: 262 lb (118.8 kg)     Height:        General: A/O 4, positive acute or chronic respiratory failure, (patient  still required significant O2 support)  Neck:  Negative scars, masses, torticollis, lymphadenopathy, JVD Lungs: Clear to auscultation bilaterally without wheezes. Bibasilar crackles Cardiovascular: Regular rhythm And rate without murmur gallop or rub normal S1 and S2    Discharge Instructions  Discharge Instructions    AMB Referral to [place] Management    Complete by:  As directed    Patient was admitted with active problems of COPD exacerbation, HF and Pneumonia.  Patient admits lack of knowledge with the signs and symptoms of COPD, Pneumonia and HF that will need medical assistance. He also expressed not beingaware of the COPD/ HF zones/ tool and has minimal recall of ways to manage COPD/ HF at home. He reports  having weighing scale but not regularly monitoring his weight.   Reason for consult:  Referral to [facility] community care management coordinator for further in home evaluation and assessment of COPD/ HF and follow-up of pneumonia   Diagnoses of:   COPD/ Pneumonia Heart Failure     Expected date of contact:  1-3 days (reserved for hospital discharges)   Increase activity slowly    Complete by:  As directed      Allergies as of 01/01/2017      Reactions   Eliquis [apixaban] Other (See Comments)   Bleeding event   Codeine Other (See Comments)   Cant remember      Medication List    STOP taking these medications   losartan 100 MG tablet Commonly known as:  COZAAR     TAKE these medications   acetaminophen 325 MG tablet Commonly known as:  TYLENOL Take 2 tablets (650 mg total) by mouth every 6 (six) hours as needed for mild pain (or Fever >/= 101).   albuterol (2.5 MG/3ML) 0.083% nebulizer solution Commonly known as:  PROVENTIL Take 3 mLs (2.5 mg total) by nebulization every 4 (four) hours as needed for wheezing or shortness of breath.   ALPRAZolam 1 MG tablet Commonly known as:  XANAX Take 1 tablet (1 mg total) by mouth 2 (two) times daily as needed for anxiety.   aspirin EC 81 MG tablet Take 1 tablet (81 mg total) by mouth daily. Do not fill   B-12 5000 MCG Tbdp Take 5,000 Units by mouth daily.   carvedilol 12.5 MG tablet Commonly known as:  COREG Take 1 tablet (12.5 mg total) by mouth 2 (two) times daily with a meal. What changed:  when to take this   ferrous sulfate 325 (65 FE) MG tablet Take 1 tablet (325 mg total) by mouth daily.   fluticasone 50 MCG/ACT nasal spray Commonly known as:  FLONASE Place 2 sprays into both nostrils daily.   Fluticasone-Salmeterol 250-50 MCG/DOSE Aepb Commonly known as:  ADVAIR DISKUS Inhale 1 puff into the lungs every 12 (twelve) hours. What changed:  when to take this   furosemide 40 MG tablet Commonly known as:  LASIX Take 1  tablet (40 mg total) by mouth 2 (two) times daily. What changed:  when to take this   hydrocortisone 25 MG suppository Commonly known as:  ANUSOL-HC Place 1 suppository (25 mg total) rectally 2 (two) times daily.   omeprazole 20 MG capsule Commonly known as:  PRILOSEC Take 1 capsule (20 mg total) by mouth daily.   ondansetron 4 MG tablet Commonly known as:  ZOFRAN Take 1 tablet (4 mg total) by mouth every 6 (six) hours as needed for nausea.   OVER THE COUNTER MEDICATION Inhale 1 application into the lungs  at bedtime. CPAP with O2   oxymetazoline 0.05 % nasal spray Commonly known as:  AFRIN Place 1 spray into both nostrils 2 (two) times daily as needed for congestion.   potassium chloride SA 20 MEQ tablet Commonly known as:  K-DUR,KLOR-CON Take 2 tablets (40 mEq total) by mouth daily.   predniSONE 20 MG tablet Commonly known as:  DELTASONE Take 1 tablet (20 mg total) by mouth daily with breakfast.   propafenone 300 MG tablet Commonly known as:  RYTHMOL Take 1 tablet (300 mg total) by mouth 2 (two) times daily.   rosuvastatin 10 MG tablet Commonly known as:  CRESTOR Take 1 tablet (10 mg total) by mouth at bedtime.   sodium chloride 0.65 % Soln nasal spray Commonly known as:  OCEAN Place 2 sprays into both nostrils as needed for congestion.   tetrahydrozoline 0.05 % ophthalmic solution Commonly known as:  REDNESS RELIEVER EYE DROPS Place 1 drop into both eyes daily. What changed:  medication strength   tiotropium 18 MCG inhalation capsule Commonly known as:  SPIRIVA Place 1 capsule (18 mcg total) into inhaler and inhale daily.   vitamin C 500 MG tablet Commonly known as:  ASCORBIC ACID Take 1 tablet (500 mg total) by mouth daily.            Durable Medical Equipment        Start     Ordered   01/01/17 1614  For home use only DME oxygen  Once    Comments:  -Titrate O2 to maintain SPO2 89-93% SATURATION QUALIFICATIONS: (This note is used to comply with  regulatory documentation for home oxygen) Patient Saturations on 3L/min 02 at Rest = 94% Patient Saturations on 4L/min 02 while Ambulating = 86% Patient Saturations on 5L/min 02 Liters of oxygen while Ambulating = 88-89% Please briefly explain why patient needs home oxygen: Pt requires 4-6L/min 02 to maintain Sp02 in high 80s-low 90s during activity.  Patient aware O2 24 hours a day, 3 L/min at rest and titrate up to 6 L/min with exertion. Maintain SPO2 89-93%  Question Answer Comment  Mode or (Route) Nasal cannula   Frequency Continuous (stationary and portable oxygen unit needed)   Oxygen conserving device Yes   Oxygen delivery system Gas      01/01/17 1613   12/28/16 1455  For home use only DME Other see comment  Once    Comments:  Oxymizer for high flow O2 at 5LPM at home   12/28/16 1455       Discharge Care Instructions        Start     Ordered   01/02/17 0000  carvedilol (COREG) 12.5 MG tablet  2 times daily with meals     01/01/17 1827   01/01/17 0000  albuterol (PROVENTIL) (2.5 MG/3ML) 0.083% nebulizer solution  Every 4 hours PRN     01/01/17 1827   01/01/17 0000  ferrous sulfate 325 (65 FE) MG tablet  Daily     01/01/17 1827   01/01/17 0000  fluticasone (FLONASE) 50 MCG/ACT nasal spray  Daily     01/01/17 1827   01/01/17 0000  Fluticasone-Salmeterol (ADVAIR DISKUS) 250-50 MCG/DOSE AEPB  Every 12 hours     01/01/17 1827   01/01/17 0000  furosemide (LASIX) 40 MG tablet  2 times daily     01/01/17 1827   01/01/17 0000  hydrocortisone (ANUSOL-HC) 25 MG suppository  2 times daily     01/01/17 1827   01/01/17 0000  omeprazole (  PRILOSEC) 20 MG capsule  Daily     01/01/17 1827   01/01/17 0000  ondansetron (ZOFRAN) 4 MG tablet  Every 6 hours PRN     01/01/17 1827   01/01/17 0000  oxymetazoline (AFRIN) 0.05 % nasal spray  2 times daily PRN     01/01/17 1827   01/01/17 0000  potassium chloride SA (K-DUR,KLOR-CON) 20 MEQ tablet  Daily     01/01/17 1827   01/01/17 0000   predniSONE (DELTASONE) 20 MG tablet  Daily with breakfast     01/01/17 1827   01/01/17 0000  propafenone (RYTHMOL) 300 MG tablet  2 times daily     01/01/17 1827   01/01/17 0000  rosuvastatin (CRESTOR) 10 MG tablet  Daily at bedtime     01/01/17 1827   01/01/17 0000  tetrahydrozoline (REDNESS RELIEVER EYE DROPS) 0.05 % ophthalmic solution  Daily     01/01/17 1827   01/01/17 0000  tiotropium (SPIRIVA) 18 MCG inhalation capsule  Daily     01/01/17 1827   01/01/17 0000  vitamin C (ASCORBIC ACID) 500 MG tablet  Daily     01/01/17 1827   12/30/16 0000  acetaminophen (TYLENOL) 325 MG tablet  Every 6 hours PRN     12/30/16 1506   12/30/16 0000  ALPRAZolam (XANAX) 1 MG tablet  2 times daily PRN     12/30/16 1506   12/30/16 0000  sodium chloride (OCEAN) 0.65 % SOLN nasal spray  As needed     12/30/16 1506   12/30/16 0000  Increase activity slowly     12/30/16 1506   12/22/16 0000  AMB Referral to [facility] Management    Comments:  Patient was admitted with active problems of COPD exacerbation, HF and Pneumonia.  Patient admits lack of knowledge with the signs and symptoms of COPD, Pneumonia and HF that will need medical assistance. He also expressed not beingaware of the COPD/ HF zones/ tool and has minimal recall of ways to manage COPD/ HF at home. He reports having weighing scale but not regularly monitoring his weight.  Question Answer Comment  Reason for consult Referral to [facility] community care management coordinator for further in home evaluation and assessment of COPD/ HF and follow-up of pneumonia   Diagnoses of COPD/ Pneumonia   Diagnoses of Heart Failure   Expected date of contact 1-3 days (reserved for hospital discharges)      12/22/16 1142     Allergies  Allergen Reactions  . Eliquis [Apixaban] Other (See Comments)    Bleeding event  . Codeine Other (See Comments)    Cant remember    Contact information for follow-up providers    Rush Farmer, MD. Schedule an appointment  as soon as possible for a visit in 4 week(s).   Specialty:  Pulmonary Disease Why:  -Schedule follow-up with Dr. Jennet Maduro PC CM in 2-4 weeks COPD exacerbation, multifocal pneumonia, acute on chronic respiratory failure, overall declining respiratory status. Request spirometry pre-/post bronchodilation, DLCO  Contact information: [address] [place] [address] [phone]        Evans Lance, MD. Schedule an appointment as soon as possible for a visit in 2 week(s).   Specialty:  Cardiology Why:  Schedule follow-up Dr. Crissie Sickles [facility] MG, in 1-2 weeks chronic diastolic CHF, Wolff-Parkinson-White syndrome, essential HTN  Contact information: [address] [place] [address] [phone]        Myer Peer, MD. Schedule an  appointment as soon as possible for a visit in 2 week(s).   Specialty:  Family Medicine Why:  Schedule follow-up Dr. Ann Held 1-2 week essential hypertension, ascending thoracic aortic aneurysm, OSA/OHS, Contact information: [place] [address] [phone]            Contact information for after-discharge care    Destination    [name] Follow up.   Specialty:  [place] information: [place] [phone]                   The results of significant diagnostics from this hospitalization (including imaging, microbiology, ancillary and laboratory) are listed below for reference.    Significant Diagnostic Studies: Dg Chest 1 View  Result Date: 12/16/2016 CLINICAL DATA:  76 year old male with shortness of breath productive cough and chest congestion for 1 week. Former smoker. EXAM: CHEST 1 VIEW COMPARISON:  Chest CTA 03/13/2016 and earlier. FINDINGS: Portable AP semi upright view at 1541 hours. Stable lung volumes. Stable mild cardiomegaly. Chronic increased interstitial markings, including  chronic patchy and reticular opacity at the right lung base. However, there is new chronic left mid and lower lung reticulonodular opacity compared to 2017. No definite pleural effusion. IMPRESSION: Chronic lung disease with superimposed new left mid and lower lung reticulonodular opacity suspicious for multifocal acute infectious exacerbation. Electronically Signed   By: Genevie Ann M.D.   On: 12/16/2016 16:00   Ct Angio Chest Pe W Or Wo Contrast  Result Date: 12/20/2016 CLINICAL DATA:  Shortness of breath. EXAM: CT ANGIOGRAPHY CHEST WITH CONTRAST TECHNIQUE: Multidetector CT imaging of the chest was performed using the standard protocol during bolus administration of intravenous contrast. Multiplanar CT image reconstructions and MIPs were obtained to evaluate the vascular anatomy. CONTRAST:  100 mL of Isovue 370 intravenously. COMPARISON:  CT scan of March 13, 2016. FINDINGS: Cardiovascular: Satisfactory opacification of the pulmonary arteries to the segmental level. No evidence of pulmonary embolism. Normal heart size. No pericardial effusion. Atherosclerosis of thoracic aorta is noted. 4.2 cm ascending thoracic aortic aneurysm is noted. Mild coronary artery calcifications are noted. No pericardial effusion is noted. Mediastinum/Nodes: No enlarged mediastinal, hilar, or axillary lymph nodes. Thyroid gland, trachea, and esophagus demonstrate no significant findings. Lungs/Pleura: No pneumothorax or significant pleural effusion is noted. Mild bilateral posterior basilar pneumonia or atelectasis is noted, with left greater than right. Airspace opacity is noted laterally in the right upper lobe also concerning for pneumonia or atelectasis. Upper Abdomen: No acute abnormality. Musculoskeletal: No chest wall abnormality. No acute or significant osseous findings. Review of the MIP images confirms the above findings. IMPRESSION: No definite evidence of pulmonary embolus. 4.2 cm ascending thoracic aortic aneurysm.  Recommend annual imaging followup by CTA or MRA. This recommendation follows 2010 ACCF/AHA/AATS/ACR/ASA/SCA/SCAI/SIR/STS/SVM Guidelines for the Diagnosis and Management of Patients with Thoracic Aortic Disease. Circulation. 2010; 121: X324-M010. Multifocal airspace opacities are noted in both lungs, particularly in both lung bases and right upper lobe laterally. These are concerning for pneumonia or atelectasis. Aortic Atherosclerosis (ICD10-I70.0). Electronically Signed   By: Marijo Conception, M.D.   On: 12/20/2016 09:24   Dg Chest Port 1 View  Result Date: 12/27/2016 CLINICAL DATA:  Pneumonia follow-up. EXAM: PORTABLE CHEST 1 VIEW COMPARISON:  December 24, 2016 FINDINGS: Bibasilar opacities are stable in the lateral right lung base and mildly increased in the lateral left lung base. The cardiomediastinal silhouette is stable. No other interval changes.  IMPRESSION: Stable opacity in the lateral right lung base. Increased opacity in the lateral left lung base. Recommend follow-up to resolution. Electronically Signed   By: Dorise Bullion III M.D   On: 12/27/2016 08:54   Dg Chest Port 1 View  Result Date: 12/24/2016 CLINICAL DATA:  Cough and shortness of Breath EXAM: PORTABLE CHEST 1 VIEW COMPARISON:  12/20/2016 FINDINGS: Cardiac shadow is mildly enlarged but stable. Prominence of the thoracic aorta is again seen. Bibasilar changes are noted right slightly greater than left similar to that seen on the prior exam. No new focal abnormality is noted. IMPRESSION: Bibasilar infiltrative change stable from previous CT. Electronically Signed   By: Inez Catalina M.D.   On: 12/24/2016 07:40    Microbiology: Recent Results (from the past 240 hour(s))  Culture, Urine     Status: None   Collection Time: 12/24/16  4:44 PM  Result Value Ref Range Status   Specimen Description URINE, CLEAN CATCH  Final   Special Requests NONE  Final   Culture NO GROWTH  Final   Report Status 12/25/2016 FINAL  Final      Labs: Basic Metabolic Panel:  Recent Labs Lab 12/26/16 0340 12/28/16 0158 12/29/16 0304 01/01/17 0752  NA 135 136 136 135  K 4.1 4.0 4.2 4.1  CL 97* 95* 97* 99*  CO2 30 33* 32 27  GLUCOSE 112* 137* 119* 81  BUN 31* 34* 34* 33*  CREATININE 1.39* 1.45* 1.37* 1.38*  CALCIUM 8.1* 8.3* 8.5* 8.4*  MG  --   --   --  2.2   Liver Function Tests: No results for input(s): AST, ALT, ALKPHOS, BILITOT, PROT, ALBUMIN in the last 168 hours. No results for input(s): LIPASE, AMYLASE in the last 168 hours. No results for input(s): AMMONIA in the last 168 hours. CBC:  Recent Labs Lab 12/29/16 0304  WBC 10.0  HGB 13.6  HCT 43.4  MCV 93.3  PLT 182   Cardiac Enzymes: No results for input(s): CKTOTAL, CKMB, CKMBINDEX, TROPONINI in the last 168 hours. BNP: BNP (last 3 results)  Recent Labs  03/11/16 1600  BNP 668.9*    ProBNP (last 3 results) No results for input(s): PROBNP in the last 8760 hours.  CBG: No results for input(s): GLUCAP in the last 168 hours.     Signed:  Dia Crawford, MD [facility] [phone] pager

## 2016-12-29 NOTE — NC FL2 (Signed)
[place] LEVEL OF CARE SCREENING TOOL     IDENTIFICATION  Patient Name: Chad Hogan Birthdate: Oct 05, 1940 Sex: male Admission Date (Current Location): 12/16/2016  [facility] and [place] Number:  Herbalist and Address:  [facility] [place] [place] [address] [place], [place] [address]      Provider Number: [id number]  Attending Physician Name and Address:  Allie Bossier, MD  Relative Name and Phone Number:  Willeen Niece, sister, [phone]    Current Level of Care: Hospital Recommended Level of Care: [place] Prior Approval Number:    Date Approved/Denied:   PASRR Number: [id number]  Discharge Plan: SNF    Current Diagnoses: Patient Active Problem List   Diagnosis Date Noted  . COPD exacerbation ([place]) 12/22/2016  . CAP (community acquired pneumonia) 12/16/2016  . Dehydration 12/16/2016  . CKD (chronic kidney disease), stage III 12/16/2016  . Acute on chronic respiratory failure with hypoxia ([place]) 12/16/2016  . NSVT (nonsustained ventricular tachycardia) ([place]) 12/16/2016  . Sepsis ([place]) 12/16/2016  . Hypocalcemia 12/16/2016  . SVT (supraventricular tachycardia) ([place]) 03/23/2016  . WPW syndrome 03/23/2016  . Bradycardia 03/11/2016  . Ventricular tachycardia ([place]) 03/11/2016  . Benign neoplasm of colon 06/11/2014  . Diverticulosis of colon without hemorrhage 06/11/2014  . Internal and external hemorrhoids without complication 14/43/1540  . Duodenal mass 06/11/2014  . History of iron deficiency anemia 05/02/2014  . Acute pulmonary embolism ([place]) 05/02/2014  . PALPITATIONS 02/11/2010  . ATYPICAL DEPRESSIVE DISORDER 08/21/2008  . PULMONARY FIBROSIS 09/15/2007  . Chronic diastolic heart failure ([place]) 09/15/2007  . HYPERLIPIDEMIA 08/16/2007  . Sleep apnea 08/16/2007  . Morbid obesity ([place]) 08/15/2007  . ANXIETY 08/15/2007  . Essential hypertension 08/15/2007  . ISCHEMIC HEART DISEASE 08/15/2007  . WOLFF  (WOLFE)-PARKINSON-WHITE (WPW) SYNDROME 08/15/2007  . PREMATURE VENTRICULAR CONTRACTIONS 08/15/2007  . Chronic airway obstruction, not elsewhere classified 08/15/2007    Orientation RESPIRATION BLADDER Height & Weight     Self, Time, Situation, Place  O2, Other (Comment) (HFNC 5L; bipap) Continent Weight: 264 lb 6.4 oz (119.9 kg) Height:  5\' 11"  (180.3 cm)  BEHAVIORAL SYMPTOMS/MOOD NEUROLOGICAL BOWEL NUTRITION STATUS      Continent Diet (please see DC summary)  AMBULATORY STATUS COMMUNICATION OF NEEDS Skin   Limited Assist Verbally                         Personal Care Assistance Level of Assistance  Bathing, Feeding, Dressing Bathing Assistance: Limited assistance Feeding assistance: Independent Dressing Assistance: Limited assistance     Functional Limitations Info  Sight, Hearing, Speech Sight Info: Adequate Hearing Info: Adequate Speech Info: Adequate    SPECIAL CARE FACTORS FREQUENCY  PT (By licensed PT)     PT Frequency: 5x/week              Contractures Contractures Info: Not present    Additional Factors Info  Code Status, Allergies Code Status Info: Full Allergies Info: Eliquis Apixaban, Codeine           Current Medications (12/29/2016):  This is the current hospital active medication list Current Facility-Administered Medications  Medication Dose Route Frequency Provider Last Rate Last Dose  . acetaminophen (TYLENOL) tablet 650 mg  650 mg Oral Q6H PRN Doutova, Anastassia, MD       Or  . acetaminophen (TYLENOL) suppository 650 mg  650 mg Rectal Q6H PRN Doutova, Anastassia, MD      . albuterol (PROVENTIL) (2.5 MG/3ML)  0.083% nebulizer solution 2.5 mg  2.5 mg Nebulization Q4H PRN Cherene Altes, MD      . ALPRAZolam Duanne Moron) tablet 1 mg  1 mg Oral BID PRN Toy Baker, MD   1 mg at 12/29/16 0914  . aspirin EC tablet 81 mg  81 mg Oral Daily Doutova, Anastassia, MD   81 mg at 12/29/16 0914  . carvedilol (COREG) tablet 12.5 mg  12.5 mg  Oral BID WC Allie Bossier, MD   12.5 mg at 12/29/16 0914  . dextromethorphan-guaiFENesin (MUCINEX DM) 30-600 MG per 12 hr tablet 1 tablet  1 tablet Oral BID Allie Bossier, MD   1 tablet at 12/29/16 [phone]  . enoxaparin (LOVENOX) injection 60 mg  60 mg Subcutaneous Daily Hammons, Kimberly B, RPH   60 mg at 12/28/16 1000  . fluticasone (FLONASE) 50 MCG/ACT nasal spray 2 spray  2 spray Each Nare Daily Cherene Altes, MD   2 spray at 12/29/16 0916  . furosemide (LASIX) tablet 40 mg  40 mg Oral BID Cherene Altes, MD   40 mg at 12/29/16 0915  . hydrocortisone (ANUSOL-HC) suppository 25 mg  25 mg Rectal BID Lovey Newcomer T, NP   25 mg at 12/29/16 0916  . mometasone-formoterol (DULERA) 200-5 MCG/ACT inhaler 2 puff  2 puff Inhalation BID Toy Baker, MD   2 puff at 12/28/16 2057  . ondansetron (ZOFRAN) tablet 4 mg  4 mg Oral Q6H PRN Doutova, Anastassia, MD       Or  . ondansetron (ZOFRAN) injection 4 mg  4 mg Intravenous Q6H PRN Doutova, Anastassia, MD      . oxymetazoline (AFRIN) 0.05 % nasal spray 1 spray  1 spray Each Nare BID PRN Cherene Altes, MD   1 spray at 12/26/16 1157  . pantoprazole (PROTONIX) EC tablet 40 mg  40 mg Oral Daily Doutova, Nyoka Lint, MD   40 mg at 12/29/16 0915  . potassium chloride SA (K-DUR,KLOR-CON) CR tablet 40 mEq  40 mEq Oral Daily Cherene Altes, MD   40 mEq at 12/29/16 0916  . predniSONE (DELTASONE) tablet 40 mg  40 mg Oral Q breakfast Cherene Altes, MD   40 mg at 12/29/16 0915  . propafenone (RYTHMOL) tablet 300 mg  300 mg Oral BID Toy Baker, MD   300 mg at 12/29/16 0915  . rosuvastatin (CRESTOR) tablet 10 mg  10 mg Oral QHS Toy Baker, MD   10 mg at 12/28/16 2221  . sodium chloride (OCEAN) 0.65 % nasal spray 2 spray  2 spray Each Nare PRN Allie Bossier, MD      . tetrahydrozoline 0.05 % ophthalmic solution 1 drop  1 drop Both Eyes Daily Doutova, Anastassia, MD   1 drop at 12/25/16 0946  . tiotropium (SPIRIVA) inhalation  capsule 18 mcg  18 mcg Inhalation Daily Cherene Altes, MD   18 mcg at 12/29/16 0800     Discharge Medications: Please see discharge summary for a list of discharge medications.  Relevant Imaging Results:  Relevant Lab Results:   Additional Information SSN: [id number]  Estanislado Emms, LCSW

## 2016-12-29 NOTE — Progress Notes (Signed)
SATURATION QUALIFICATIONS: (This note is used to comply with regulatory documentation for home oxygen)  Patient Saturations on 3L/min 02 at Rest = 94%  Patient Saturations on 4L/min 02 while Ambulating = 86%  Patient Saturations on 5L/min 02 Liters of oxygen while Ambulating = 88-89%  Please briefly explain why patient needs home oxygen: Pt requires 4-5L/min 02 to maintain Sp02 in high 80s-low 90s during activity.   Wray Kearns, [place], DPT [phone]

## 2016-12-30 MED ORDER — ALBUTEROL SULFATE (2.5 MG/3ML) 0.083% IN NEBU
2.5000 mg | INHALATION_SOLUTION | RESPIRATORY_TRACT | 12 refills | Status: DC | PRN
Start: 2016-12-30 — End: 2017-01-01

## 2016-12-30 MED ORDER — PREDNISONE 20 MG PO TABS: 20.0000 mg | ORAL_TABLET | Freq: Every day | ORAL | Status: DC

## 2016-12-30 MED ORDER — FUROSEMIDE 40 MG PO TABS: 40.0000 mg | ORAL_TABLET | Freq: Two times a day (BID) | ORAL | Status: DC

## 2016-12-30 MED ORDER — HYDROCORTISONE ACETATE 25 MG RE SUPP: 25.0000 mg | Freq: Two times a day (BID) | RECTAL | 0 refills | Status: DC

## 2016-12-30 MED ORDER — POTASSIUM CHLORIDE CRYS ER 20 MEQ PO TBCR: 40.0000 meq | EXTENDED_RELEASE_TABLET | Freq: Every day | ORAL | Status: DC

## 2016-12-30 MED ORDER — PREDNISONE 20 MG PO TABS
20.0000 mg | ORAL_TABLET | Freq: Every day | ORAL | Status: DC
  Administered 2016-12-31 – 2017-01-01 (×2): 20 mg via ORAL
  Filled 2016-12-30 (×2): qty 1

## 2016-12-30 MED ORDER — FLUTICASONE PROPIONATE 50 MCG/ACT NA SUSP: 2.0000 | Freq: Every day | NASAL | 2 refills | Status: DC

## 2016-12-30 MED ORDER — SALINE SPRAY 0.65 % NA SOLN: 2.0000 | NASAL | 0 refills | Status: DC | PRN

## 2016-12-30 MED ORDER — ONDANSETRON HCL 4 MG PO TABS: 4.0000 mg | ORAL_TABLET | Freq: Four times a day (QID) | ORAL | 0 refills | Status: DC | PRN

## 2016-12-30 MED ORDER — ACETAMINOPHEN 325 MG PO TABS: 650.0000 mg | ORAL_TABLET | Freq: Four times a day (QID) | ORAL | Status: DC | PRN

## 2016-12-30 MED ORDER — OXYMETAZOLINE HCL 0.05 % NA SOLN: 1.0000 | Freq: Two times a day (BID) | NASAL | 0 refills | Status: DC | PRN

## 2016-12-30 MED ORDER — ALPRAZOLAM 1 MG PO TABS: 1.0000 mg | ORAL_TABLET | Freq: Two times a day (BID) | ORAL | 0 refills | Status: DC | PRN

## 2016-12-30 NOTE — Plan of Care (Signed)
Problem: Physical Regulation: Goal: Ability to maintain clinical measurements within normal limits will improve Outcome: Progressing Patient remains on HFNC, CPAP at night. Tolerating well. Ambulates to bathroom.

## 2016-12-30 NOTE — Progress Notes (Signed)
Occupational Therapy Treatment Patient Details Name: Chad Hogan MRN: [id number] DOB: Sep 10, 1940 Today's Date: 12/30/2016    History of present illness Patient is a 76 yo male admitted 9/5 with SOB and PNA. 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    OT comments  Pt making progress with functional goals, however pt stated that he is very concerned about d/c plans because [employer] will not approve ST SNF stay despite returning home being an unsafe d/c to manage himself. Pt requires 4-5L/min 02 to maintain Sp02 in high 80s-low 90s during activity and is at home alone and. OT will continue to follow acutely   Follow Up Recommendations  SNF    Equipment Recommendations  None recommended by OT    Recommendations for Other Services      Precautions / Restrictions Precautions Precautions: Other (comment) (O2 drops with min - mod exertion) Precaution Comments: watch 02 sats Restrictions Weight Bearing Restrictions: No       Mobility Bed Mobility               General bed mobility comments: Pt sitting EOB upon arrival  Transfers Overall transfer level: Modified independent Equipment used: None Transfers: Sit to/from Stand Sit to Stand: Modified independent (Device/Increase time)         General transfer comment: Supervision for safety, O2 SATs decreased <88% for functional mobility to snf from bathroom for toileting acitivty    Balance Overall balance assessment: Needs assistance Sitting-balance support: No upper extremity supported;Feet supported   Sitting balance - Comments: Pt verbalizes the use of ADL A/E for LB energy conservation   Standing balance support: No upper extremity supported Standing balance-Leahy Scale: Good                             ADL either performed or assessed with clinical judgement   ADL Overall ADL's :  Needs assistance/impaired     Grooming: Wash/dry hands;Wash/dry face;Standing;Supervision/safety                   Toilet Transfer: Ambulation;Supervision/safety;Comfort height toilet;Grab Environmental education officer and Hygiene: Sit to/from stand;Modified independent       Functional mobility during ADLs: Supervision/safety General ADL Comments: Pt requires 4-5L/min 02 to maintain Sp02 in high 80s-low 90s during activity.     Vision Baseline Vision/History: Wears glasses Patient Visual Report: No change from baseline     Perception     Praxis      Cognition Arousal/Alertness: Awake/alert Behavior During Therapy: WFL for tasks assessed/performed Overall Cognitive Status: Within Functional Limits for tasks assessed                                          Exercises     Shoulder Instructions       General Comments  pt pleasant and cooperative    Pertinent Vitals/ Pain       Pain Assessment: No/denies pain  Home Living                                          Prior Functioning/Environment  Frequency  Min 2X/week        Progress Toward Goals  OT Goals(current goals can now be found in the care plan section)  Progress towards OT goals: OT to reassess next treatment     Plan Discharge plan remains appropriate    Co-evaluation                 AM-PAC PT "6 Clicks" Daily Activity     Outcome Measure   Help from another person eating meals?: None Help from another person taking care of personal grooming?: A Little Help from another person toileting, which includes using toliet, bedpan, or urinal?: A Little Help from another person bathing (including washing, rinsing, drying)?: A Little Help from another person to put on and taking off regular upper body clothing?: A Little Help from another person to put on and taking off regular lower body clothing?: A Little 6  Click Score: 19    End of Session Equipment Utilized During Treatment: Oxygen  OT Visit Diagnosis: Unsteadiness on feet (R26.81)   Activity Tolerance Patient tolerated treatment well   Patient Left in bed;with call bell/phone within reach (sitting EOB)   Nurse Communication      Functional Assessment Tool Used: AM-PAC 6 Clicks Daily Activity   Time: 1352-1403 OT Time Calculation (min): 11 min  Charges: OT G-codes **NOT FOR INPATIENT CLASS** Functional Assessment Tool Used: AM-PAC 6 Clicks Daily Activity OT General Charges $OT Visit: 1 Visit OT Treatments $Therapeutic Activity: 8-22 mins    Britt Bottom 12/30/2016, 2:39 PM

## 2016-12-30 NOTE — Discharge Summary (Signed)
DISCHARGE SUMMARY  Chad Hogan  MR#: [id number]  DOB:March 27, 1941  Date of Admission: 12/16/2016 Date of Discharge: 12/30/2016  Attending Physician:Zigmond Trela T  Patient's YQM:VHQIO, Gwenyth Bouillon, MD  Consults:  None   Disposition: D/C to SNF   Follow-up Appts:  Contact information for follow-up providers    Rush Farmer, MD. Schedule an appointment as soon as possible for a visit in 4 week(s).   Specialty:  Pulmonary Disease Why:  -Schedule follow-up with Dr. Jennet Maduro PC CM in 2-4 weeks COPD exacerbation, multifocal pneumonia, acute on chronic respiratory failure, overall declining respiratory status. Request spirometry pre-/post bronchodilation, DLCO  Contact information: [address] [place] [address] [phone]        Evans Lance, MD. Schedule an appointment as soon as possible for a visit in 2 week(s).   Specialty:  Cardiology Why:  Schedule follow-up Dr. Crissie Sickles [facility], in 1-2 weeks chronic diastolic CHF, Wolff-Parkinson-White syndrome, essential HTN  Contact information: [address] [place] [address] [phone]        Myer Peer, MD. Schedule an appointment as soon as possible for a visit in 2 week(s).   Specialty:  Family Medicine Why:  Schedule follow-up Dr. Ann Held 1-2 week essential hypertension, ascending thoracic aortic aneurysm, OSA/OHS, Contact information: [place] [address] [phone]            Contact information for after-discharge care    Destination    [name] Follow up.   Specialty:  [place] information: [place] [phone]                  Tests Needing Follow-up: -monitor daily weights/volume status w/ potential need to further titrate diuretic dose -wean O2 support as able  -follow renal fxn intermittently w/ diuretic use  -ascending  thoracic aortic aneurysm will need routine annual monitoring -taper steroid to off over next 4-5 days    Discharge Diagnoses: Acute hypoxic respiratory failure and sepsis due to multifocal pneumonia and acute COPD exacerbation Acute renal failure Chronic diastolic congestive heart failure Wolff-Parkinson-White syndrome 4.2 cm ascending thoracic aortic aneurysm Hypokalemia HTN OSA Obesity - Body mass index is 36.89 kg/m.  Initial presentation: 76 y.o.M HxAnxiety, HTN, Ischemic Heart Disease, SVT secondary to AV re-entrant tachycardia, NSVT, Wolff-Parkinson-White syndrome, COPD(on 3 L O2 at home), HLD, and OSA on CPAP who presented with 1 week of cough and shortness of breath.  EMS reported he was satting 53% on RA initially.   During ER stay his CXR was suggestive of PNA, and he was noted to have a run of VT.   Hospital Course:  Acute hypoxic respiratory failure and sepsis due to multifocal pneumonia and acute COPD exacerbation Continues to require high-level oxygen support but we have made progress in weaning - has completed a course of empiric abx - cont to wean O2 as able - f/u CXR w/o signif change - use IS - up in chair as much as possible   Acute renal failure Baseline creatinine reportedly 1.1 - follow intermittently w/ diuretic use     Chronic diastolic congestive heart failure Cont in attempts to diurese - net negative ~11L for the hospital stay   Wolff-Parkinson-White syndrome F/u w/ EP as outpt   4.2 cm ascending thoracic aortic aneurysm Will need annual monitoring   Hypokalemia Due to diuretic w/ poor intake - corrected and stable  HTN BP controlled w/ extensive diuresis   OSA  Obesity - Body mass index is 36.89 kg/m.  Allergies as of 12/30/2016      Reactions   Eliquis [apixaban] Other (See Comments)   Bleeding event   Codeine Other (See Comments)   Cant remember      Medication List    STOP taking these medications   losartan 100 MG  tablet Commonly known as:  COZAAR     TAKE these medications   acetaminophen 325 MG tablet Commonly known as:  TYLENOL Take 2 tablets (650 mg total) by mouth every 6 (six) hours as needed for mild pain (or Fever >/= 101).   ADVAIR DISKUS 250-50 MCG/DOSE Aepb Generic drug:  Fluticasone-Salmeterol Inhale 1 puff into the lungs every 12 (twelve) hours.   albuterol (2.5 MG/3ML) 0.083% nebulizer solution Commonly known as:  PROVENTIL Take 3 mLs (2.5 mg total) by nebulization every 4 (four) hours as needed for wheezing or shortness of breath.   ALPRAZolam 1 MG tablet Commonly known as:  XANAX Take 1 tablet (1 mg total) by mouth 2 (two) times daily as needed for anxiety.   aspirin EC 81 MG tablet Take 1 tablet (81 mg total) by mouth daily. Do not fill   B-12 5000 MCG Tbdp Take 5,000 Units by mouth daily.   carvedilol 12.5 MG tablet Commonly known as:  COREG Take 1 tablet (12.5 mg total) by mouth 2 (two) times daily.   ferrous sulfate 325 (65 FE) MG tablet Take 1 tablet by mouth daily.   fluticasone 50 MCG/ACT nasal spray Commonly known as:  FLONASE Place 2 sprays into both nostrils daily.   furosemide 40 MG tablet Commonly known as:  LASIX Take 1 tablet (40 mg total) by mouth 2 (two) times daily. What changed:  when to take this   hydrocortisone 25 MG suppository Commonly known as:  ANUSOL-HC Place 1 suppository (25 mg total) rectally 2 (two) times daily.   omeprazole 20 MG capsule Commonly known as:  PRILOSEC Take 20 mg by mouth daily.   ondansetron 4 MG tablet Commonly known as:  ZOFRAN Take 1 tablet (4 mg total) by mouth every 6 (six) hours as needed for nausea.   OVER THE COUNTER MEDICATION Inhale 1 application into the lungs at bedtime. CPAP with O2   oxymetazoline 0.05 % nasal spray Commonly known as:  AFRIN Place 1 spray into both nostrils 2 (two) times daily as needed for congestion.   potassium chloride SA 20 MEQ tablet Commonly known as:   K-DUR,KLOR-CON Take 2 tablets (40 mEq total) by mouth daily.   predniSONE 20 MG tablet Commonly known as:  DELTASONE Take 1 tablet (20 mg total) by mouth daily with breakfast.   propafenone 300 MG tablet Commonly known as:  RYTHMOL Take 1 tablet (300 mg total) by mouth 2 (two) times daily.   REDNESS RELIEVER EYE DROPS OP Place 1 drop into both eyes daily.   rosuvastatin 10 MG tablet Commonly known as:  CRESTOR Take 10 mg by mouth at bedtime.   sodium chloride 0.65 % Soln nasal spray Commonly known as:  OCEAN Place 2 sprays into both nostrils as needed for congestion.   tiotropium 18 MCG inhalation capsule Commonly known as:  SPIRIVA Place 18 mcg into inhaler and inhale daily.   vitamin C 500 MG tablet Commonly known as:  ASCORBIC ACID Take 500 mg by mouth daily.            Durable Medical Equipment  Start     Ordered   12/29/16 1042  For home use only DME oxygen  Once    Comments:  -Titrate O2 to maintain SPO2 89-93% SATURATION QUALIFICATIONS: (This note is used to comply with regulatory documentation for home oxygen) Patient Saturations on 3L/min 02 at Rest = 94% Patient Saturations on 4L/min 02 while Ambulating = 86% Patient Saturations on 5L/min 02 Liters of oxygen while Ambulating = 88-89% Please briefly explain why patient needs home oxygen: Pt requires 4-6L/min 02 to maintain Sp02 in high 80s-low 90s during activity.  Patient aware O2 24 hours a day, 3 L/min at rest and titrate up to 6 L/min with exertion. Maintain SPO2 89-93%  Question Answer Comment  Mode or (Route) Nasal cannula   Frequency Continuous (stationary and portable oxygen unit needed)   Oxygen conserving device Yes   Oxygen delivery system Gas      12/29/16 1041   12/28/16 1455  For home use only DME Other see comment  Once    Comments:  Oxymizer for high flow O2 at 5LPM at home   12/28/16 1455       Discharge Care Instructions        Start     Ordered   12/31/16 0000   fluticasone (FLONASE) 50 MCG/ACT nasal spray  Daily     12/30/16 1506   12/31/16 0000  potassium chloride SA (K-DUR,KLOR-CON) 20 MEQ tablet  Daily     12/30/16 1506   12/31/16 0000  predniSONE (DELTASONE) 20 MG tablet  Daily with breakfast     12/30/16 1506   12/30/16 0000  acetaminophen (TYLENOL) 325 MG tablet  Every 6 hours PRN     12/30/16 1506   12/30/16 0000  albuterol (PROVENTIL) (2.5 MG/3ML) 0.083% nebulizer solution  Every 4 hours PRN     12/30/16 1506   12/30/16 0000  ALPRAZolam (XANAX) 1 MG tablet  2 times daily PRN     12/30/16 1506   12/30/16 0000  furosemide (LASIX) 40 MG tablet  2 times daily     12/30/16 1506   12/30/16 0000  hydrocortisone (ANUSOL-HC) 25 MG suppository  2 times daily     12/30/16 1506   12/30/16 0000  ondansetron (ZOFRAN) 4 MG tablet  Every 6 hours PRN     12/30/16 1506   12/30/16 0000  oxymetazoline (AFRIN) 0.05 % nasal spray  2 times daily PRN     12/30/16 1506   12/30/16 0000  sodium chloride (OCEAN) 0.65 % SOLN nasal spray  As needed     12/30/16 1506   12/30/16 0000  Increase activity slowly     12/30/16 1506   12/22/16 0000  AMB Referral to [facility]    Comments:  Patient was admitted with active problems of COPD exacerbation, HF and Pneumonia.  Patient admits lack of knowledge with the signs and symptoms of COPD, Pneumonia and HF that will need medical assistance. He also expressed not beingaware of the COPD/ HF zones/ tool and has minimal recall of ways to manage COPD/ HF at home. He reports having weighing scale but not regularly monitoring his weight.  Question Answer Comment  Reason for consult Referral to [facility] community care management coordinator for further in home evaluation and assessment of COPD/ HF and follow-up of pneumonia   Diagnoses of COPD/ Pneumonia   Diagnoses of Heart Failure   Expected date of contact 1-3 days (reserved for hospital discharges)      12/22/16 1142  Day of Discharge BP (!) 150/138   Pulse  62   Temp 97.8 F (36.6 C) (Oral)   Resp 18   Ht 5\' 11"  (1.803 m)   Wt 120.6 kg (265 lb 12.8 oz)   SpO2 92%   BMI 37.07 kg/m   Physical Exam: General: No acute respiratory distress - alert and conversant  Lungs: Clear to auscultation bilaterally - distant bs th/o - no wheezing  Cardiovascular: Regular rate and rhythm - distant HS th/o  Abdomen: Nontender, morbidly obese, soft, bowel sounds positive, no rebound Extremities: Trace edema bilateral lower extremities  Basic Metabolic Panel:  Recent Labs Lab 12/24/16 0215 12/24/16 1308 12/26/16 0340 12/28/16 0158 12/29/16 0304  NA 136  --  135 136 136  K 3.2* 4.1 4.1 4.0 4.2  CL 93*  --  97* 95* 97*  CO2 34*  --  30 33* 32  GLUCOSE 108*  --  112* 137* 119*  BUN 21*  --  31* 34* 34*  CREATININE 1.37*  --  1.39* 1.45* 1.37*  CALCIUM 7.8*  --  8.1* 8.3* 8.5*  MG  --  1.8  --   --   --     CBC:  Recent Labs Lab 12/29/16 0304  WBC 10.0  HGB 13.6  HCT 43.4  MCV 93.3  PLT 182    Recent Results (from the past 240 hour(s))  Culture, Urine     Status: None   Collection Time: 12/24/16  4:44 PM  Result Value Ref Range Status   Specimen Description URINE, CLEAN CATCH  Final   Special Requests NONE  Final   Culture NO GROWTH  Final   Report Status 12/25/2016 FINAL  Final     Time spent in discharge (includes decision making & examination of pt): 35 minutes  12/30/2016, 3:07 PM   Cherene Altes, MD [facility] Office  [phone] Pager [phone]  On-Call/Text Page:      [email or web address]      password [facility]

## 2016-12-30 NOTE — Progress Notes (Signed)
Physical Therapy Treatment Patient Details Name: Chad Hogan MRN: [id number] DOB: Feb 09, 1941 Today's Date: 12/30/2016    History of Present Illness Patient is a 76 yo male admitted 9/5 with SOB and PNA. 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     PT Comments    Pt continues to be limited by low SpO2. Had to incr O2 to 8L to reach SpO2 of 88-90% with ambulation. Pt doesn't feel he can manage his activities at home due to low functional activity tolerance. Pt is agreeable to ST-SNF.   Follow Up Recommendations  SNF     Equipment Recommendations  None recommended by PT    Recommendations for Other Services       Precautions / Restrictions Precautions Precaution Comments: watch 02 sats Restrictions Weight Bearing Restrictions: No    Mobility  Bed Mobility Overal bed mobility: Modified Independent                Transfers Overall transfer level: Modified independent Equipment used: None Transfers: Sit to/from Stand Sit to Stand: Modified independent (Device/Increase time)            Ambulation/Gait Ambulation/Gait assistance: Supervision Ambulation Distance (Feet): 150 Feet Assistive device: None (pushed O2 tank) Gait Pattern/deviations: Step-through pattern;Decreased stride length Gait velocity: decr Gait velocity interpretation: Below normal speed for age/gender General Gait Details: Began amb on 6L O2 with SpO2 dropping to 84% after 75' took standing rest break and incr O2 to 8L. Amb back to room on 8L with SpO2 88-90%.   Stairs            Wheelchair Mobility    Modified Rankin (Stroke Patients Only)       Balance Overall balance assessment: Needs assistance Sitting-balance support: No upper extremity supported;Feet supported Sitting balance-Leahy Scale: Normal     Standing balance support: No upper extremity supported Standing  balance-Leahy Scale: Good                              Cognition Arousal/Alertness: Awake/alert Behavior During Therapy: WFL for tasks assessed/performed Overall Cognitive Status: Within Functional Limits for tasks assessed                                        Exercises      General Comments        Pertinent Vitals/Pain      Home Living                      Prior Function            PT Goals (current goals can now be found in the care plan section) Progress towards PT goals: Progressing toward goals    Frequency    Min 3X/week      PT Plan Discharge plan needs to be updated    Co-evaluation              AM-PAC PT "6 Clicks" Daily Activity  Outcome Measure  Difficulty turning over in bed (including adjusting bedclothes, sheets and blankets)?: None Difficulty moving from lying on back to sitting on the side of the bed? : None Difficulty sitting down on and standing up from a chair with arms (e.g., wheelchair, bedside commode, etc,.)?: None Help  needed moving to and from a bed to chair (including a wheelchair)?: None Help needed walking in hospital room?: None Help needed climbing 3-5 steps with a railing? : A Little 6 Click Score: 23    End of Session Equipment Utilized During Treatment: Oxygen Activity Tolerance: Patient limited by fatigue Patient left: in bed;with call bell/phone within reach Nurse Communication: Mobility status;Other (comment) (Incr O2 needs) PT Visit Diagnosis: Difficulty in walking, not elsewhere classified (R26.2)     Time: 0981-1914 PT Time Calculation (min) (ACUTE ONLY): 12 min  Charges:  $Gait Training: 8-22 mins                    G Codes:       [facility] PT [place] 12/30/2016, 9:45 AM

## 2016-12-30 NOTE — Progress Notes (Addendum)
Wrong entry entered for different pt.

## 2016-12-30 NOTE — Progress Notes (Addendum)
Planned to discharge patient to [facility]. [employer] denied coverage of patient's SNF stay. CSW referred to [facility] for discharge home.   CSW signing off.  Estanislado Emms, [place]  Clinical Social Worker

## 2016-12-30 NOTE — Progress Notes (Signed)
Patient wearing BIPAP when RT entered room. Patient resting comfortably with no respiratory distress noted at this time. RT will monitor as needed.

## 2016-12-30 NOTE — Discharge Instructions (Signed)
Community-Acquired Pneumonia, Adult °Pneumonia is an infection of the lungs. There are different types of pneumonia. One type can develop while a person is in a hospital. A different type, called community-acquired pneumonia, develops in people who are not, or have not recently been, in the hospital or other health care facility. °What are the causes? °Pneumonia may be caused by bacteria, viruses, or funguses. Community-acquired pneumonia is often caused by Streptococcus pneumonia bacteria. These bacteria are often passed from one person to another by breathing in droplets from the cough or sneeze of an infected person. °What increases the risk? °The condition is more likely to develop in: °· People who have chronic diseases, such as chronic obstructive pulmonary disease (COPD), asthma, congestive heart failure, cystic fibrosis, diabetes, or kidney disease. °· People who have early-stage or late-stage HIV. °· People who have sickle cell disease. °· People who have had their spleen removed (splenectomy). °· People who have poor dental hygiene. °· People who have medical conditions that increase the risk of breathing in (aspirating) secretions their own mouth and nose. °· People who have a weakened immune system (immunocompromised). °· People who smoke. °· People who travel to areas where pneumonia-causing germs commonly exist. °· People who are around animal habitats or animals that have pneumonia-causing germs, including birds, bats, rabbits, cats, and farm animals. ° °What are the signs or symptoms? °Symptoms of this condition include: °· A dry cough. °· A wet (productive) cough. °· Fever. °· Sweating. °· Chest pain, especially when breathing deeply or coughing. °· Rapid breathing or difficulty breathing. °· Shortness of breath. °· Shaking chills. °· Fatigue. °· Muscle aches. ° °How is this diagnosed? °Your health care provider will take a medical history and perform a physical exam. You may also have other tests,  including: °· Imaging studies of your chest, including X-rays. °· Tests to check your blood oxygen level and other blood gases. °· Other tests on blood, mucus (sputum), fluid around your lungs (pleural fluid), and urine. ° °If your pneumonia is severe, other tests may be done to identify the specific cause of your illness. °How is this treated? °The type of treatment that you receive depends on many factors, such as the cause of your pneumonia, the medicines you take, and other medical conditions that you have. For most adults, treatment and recovery from pneumonia may occur at home. In some cases, treatment must happen in a hospital. Treatment may include: °· Antibiotic medicines, if the pneumonia was caused by bacteria. °· Antiviral medicines, if the pneumonia was caused by a virus. °· Medicines that are given by mouth or through an IV tube. °· Oxygen. °· Respiratory therapy. ° °Although rare, treating severe pneumonia may include: °· Mechanical ventilation. This is done if you are not breathing well on your own and you cannot maintain a safe blood oxygen level. °· Thoracentesis. This procedure removes fluid around one lung or both lungs to help you breathe better. ° °Follow these instructions at home: °· Take over-the-counter and prescription medicines only as told by your health care provider. °? Only take cough medicine if you are losing sleep. Understand that cough medicine can prevent your body’s natural ability to remove mucus from your lungs. °? If you were prescribed an antibiotic medicine, take it as told by your health care provider. Do not stop taking the antibiotic even if you start to feel better. °· Sleep in a semi-upright position at night. Try sleeping in a reclining chair, or place a few pillows under your head. °· Do not use tobacco products, including cigarettes, chewing   tobacco, and e-cigarettes. If you need help quitting, ask your health care provider.  Drink enough water to keep your urine  clear or pale yellow. This will help to thin out mucus secretions in your lungs. How is this prevented? There are ways that you can decrease your risk of developing community-acquired pneumonia. Consider getting a pneumococcal vaccine if:  You are older than 77 years of age.  You are older than 77 years of age and are undergoing cancer treatment, have chronic lung disease, or have other medical conditions that affect your immune system. Ask your health care provider if this applies to you.  There are different types and schedules of pneumococcal vaccines. Ask your health care provider which vaccination option is best for you. You may also prevent community-acquired pneumonia if you take these actions:  Get an influenza vaccine every year. Ask your health care provider which type of influenza vaccine is best for you.  Go to the dentist on a regular basis.  Wash your hands often. Use hand sanitizer if soap and water are not available.  Contact a health care provider if:  You have a fever.  You are losing sleep because you cannot control your cough with cough medicine. Get help right away if:  You have worsening shortness of breath.  You have increased chest pain.  Your sickness becomes worse, especially if you are an older adult or have a weakened immune system.  You cough up blood. This information is not intended to replace advice given to you by your health care provider. Make sure you discuss any questions you have with your health care provider. Document Released: 03/30/2005 Document Revised: 08/08/2015 Document Reviewed: 07/25/2014 Elsevier Interactive Patient Education  2017 [employer].   Heart Failure Heart failure means your heart has trouble pumping blood. This makes it hard for your body to work well. Heart failure is usually a long-term (chronic) condition. You must take good care of yourself and follow your doctor's treatment plan. Follow these instructions at  home:  Take your heart medicine as told by your doctor. ? Do not stop taking medicine unless your doctor tells you to. ? Do not skip any dose of medicine. ? Refill your medicines before they run out. ? Take other medicines only as told by your doctor or pharmacist.  Stay active if told by your doctor. The elderly and people with severe heart failure should talk with a doctor about physical activity.  Eat heart-healthy foods. Choose foods that are without trans fat and are low in saturated fat, cholesterol, and salt (sodium). This includes fresh or frozen fruits and vegetables, fish, lean meats, fat-free or low-fat dairy foods, whole grains, and high-fiber foods. Lentils and dried peas and beans (legumes) are also good choices.  Limit salt if told by your doctor.  Cook in a healthy way. Roast, grill, broil, bake, poach, steam, or stir-fry foods.  Limit fluids as told by your doctor.  Weigh yourself every morning. Do this after you pee (urinate) and before you eat breakfast. Write down your weight to give to your doctor.  Take your blood pressure and write it down if your doctor tells you to.  Ask your doctor how to check your pulse. Check your pulse as told.  Lose weight if told by your doctor.  Stop smoking or chewing tobacco. Do not use gum or patches that help you quit without your doctor's approval.  Schedule and go to doctor visits as told.  Nonpregnant women  should have no more than 1 drink a day. Men should have no more than 2 drinks a day. Talk to your doctor about drinking alcohol.  Stop illegal drug use.  Stay current with shots (immunizations).  Manage your health conditions as told by your doctor.  Learn to manage your stress.  Rest when you are tired.  If it is really hot outside: ? Avoid intense activities. ? Use air conditioning or fans, or get in a cooler place. ? Avoid caffeine and alcohol. ? Wear loose-fitting, lightweight, and light-colored  clothing.  If it is really cold outside: ? Avoid intense activities. ? Layer your clothing. ? Wear mittens or gloves, a hat, and a scarf when going outside. ? Avoid alcohol.  Learn about heart failure and get support as needed.  Get help to maintain or improve your quality of life and your ability to care for yourself as needed. Contact a doctor if:  You gain weight quickly.  You are more short of breath than usual.  You cannot do your normal activities.  You tire easily.  You cough more than normal, especially with activity.  You have any or more puffiness (swelling) in areas such as your hands, feet, ankles, or belly (abdomen).  You cannot sleep because it is hard to breathe.  You feel like your heart is beating fast (palpitations).  You get dizzy or light-headed when you stand up. Get help right away if:  You have trouble breathing.  There is a change in mental status, such as becoming less alert or not being able to focus.  You have chest pain or discomfort.  You faint. This information is not intended to replace advice given to you by your health care provider. Make sure you discuss any questions you have with your health care provider. Document Released: 01/07/2008 Document Revised: 09/05/2015 Document Reviewed: 05/16/2012 Elsevier Interactive Patient Education  2017 [employer].

## 2016-12-30 NOTE — Clinical Social Work Placement (Signed)
   CLINICAL SOCIAL WORK PLACEMENT  NOTE  Date:  12/30/2016  Patient Details  Name: Chad Hogan MRN: [id number] Date of Birth: 09-Aug-1940  Clinical Social Work is seeking post-discharge placement for this patient at the [place] level of care (*CSW will initial, date and re-position this form in  chart as items are completed):  Yes   Patient/family provided with [name] list of facilities offering this level of care within the geographic area requested by the patient (or if unable, by the patient's family).  Yes   Patient/family informed of their freedom to choose among providers that offer the needed level of care, that participate in Medicare, Medicaid or managed care program needed by the patient, have an available bed and are willing to accept the patient.  Yes   Patient/family informed of 's ownership interest in [name] and [name], as well as of the fact that they are under no obligation to receive care at these facilities.  PASRR submitted to EDS on       PASRR number received on 12/29/16     Existing PASRR number confirmed on       FL2 transmitted to all facilities in geographic area requested by pt/family on 12/29/16     FL2 transmitted to all facilities within larger geographic area on       Patient informed that his/her managed care company has contracts with or will negotiate with certain facilities, including the following:  [name], [place]     Yes   Patient/family informed of bed offers received.  Patient chooses bed at [name], [name]     Physician recommends and patient chooses bed at      Patient to be transferred to [name], [name] on 12/30/16.  Patient to be transferred to facility by [facility]     Patient family notified on 12/30/16 of transfer.  Name of family member notified:  CSW offered to call family and patient declined, stating he would call them  himself     PHYSICIAN       Additional Comment:    _______________________________________________ Estanislado Emms, LCSW 12/30/2016, 3:44 PM

## 2016-12-31 DIAGNOSIS — R5381 Other malaise: Secondary | ICD-10-CM

## 2016-12-31 NOTE — Plan of Care (Signed)
Problem: Physical Regulation: Goal: Ability to maintain clinical measurements within normal limits will improve Outcome: Progressing Patient tolerates ambulating 150 feet before becoming SOB which is an improvement from admission, will continue to monitor.

## 2016-12-31 NOTE — Progress Notes (Signed)
Pt c/o increased dizziness today.  He feels fine when lying in bed but as soon as he sits up or walks he begins to feel dizzy.  Also stated he was "foggy in the head" with [place] (Exercise Tech).  Pt walked about 100 feet this am.  Orthostatic VS were done and looked okay. BP=119/66 Lying, 139/77 Sitting, 127/76 Standing at 3 min. HR remained 60-70.   Will continue to monitor.

## 2016-12-31 NOTE — Plan of Care (Signed)
Problem: Education: Goal: Knowledge of [facility] Education information/materials will improve Outcome: Completed/Met Date Met: 12/31/16 Patient is ready for discharge in the AM and he has received some D/C instructions at this time, will review in AM prior to D/C.   

## 2016-12-31 NOTE — Progress Notes (Addendum)
PROGRESS NOTE    Chad Hogan  NKN:[id number] DOB: 14-Feb-1941 DOA: 12/16/2016 PCP: Myer Peer, MD   Brief Narrative:  76 y.o. WM PMHx Anxiety, HTN, Ischemic Heart Disease, SVT secondary to antidromic AV reentrant tachycardia, NSVT, Wolff-Parkinson-White syndrome, COPD (on 3 L O2 at home), HLD, OSA on CPAP,   Presented with cough and shortness of breath this been going on for past 1 week patient today called  EMS with me Atrovent 125 of Solu-Medrol on EMS report initially satting 53% in room air but improved to 92% nebulizer treatment. He reports sputum productive of green sputum wheezing, no sick contacts, subjective fever and chills, no chest pain Patient required up to 5 L of oxygen currently satting 87% chest x-ray worrisome for pneumonia. During ER stay patient was noted to have a run of VT. hemodynamically stable currently resolved   Regarding pertinent Chronic problems: Hx of SVT secondary to antidromic AV reentrant tachycardia sp unsuccessful ablation, responded well to propafenone,     Subjective: 9/20 A/O 4, negative CP, positive dizziness/unsteadiness with standing/ambulation. Negative abdominal pain, negative N/V           Assessment & Plan:   Active Problems:   Essential hypertension   WOLFF (WOLFE)-PARKINSON-WHITE (WPW) SYNDROME   Sleep apnea   Chronic diastolic heart failure (HCC)   CKD (chronic kidney disease), stage III   Acute on chronic respiratory failure with hypoxia (HCC)   NSVT (nonsustained ventricular tachycardia) (HCC)   Hypocalcemia     COPD Exacerbation/Multifocal Pneumonia/Sepsis CAP   -Complete course antibiotics -Still requiring high levels of O2 for support. -Continued dizziness with standing/ambulation: Most likely secondary to acute on chronic respiratory failure and deconditioning  -Mucinex DM BID -Flutter valve -Dulera BID -Prednisone 20 mg daily. Continue slow taper . -Titrate O2 to maintain SPO2 89-93% -Follow-up Dr. Chesley Noon PC CM in 2-4 weeks. Schedule appointment for discharge  Acute on chronic respiratory failure with hypoxia - 3 L O2 via [place] at home -See COPD exacerbation -Echocardiogram: Consistent with diastolic CHF see results below  -CT chest PE protocol: Consistent with multifocal pneumonia -negative PE  -9/13 PCXR: No significant change   -Patient instructed 3 L O2 when sedentary: Titrate up to 6 L O2 on exertion to maintain SPO2 89-93%   Acute renal failure (baseline Cr 1.1)   Recent Labs Lab 12/26/16 0340 12/28/16 0158 12/29/16 0304  CREATININE 1.39* 1.45* 1.37*  - Cr stable continue to monitor closely  Chronic diastolic CHF (unknown base weight) -Strict I&OSince admission -14.3 L -Daily weight Filed Weights   12/29/16 0521 12/30/16 0512 12/31/16 0617  Weight: 264 lb 6.4 oz (119.9 kg) 265 lb 12.8 oz (120.6 kg) 264 lb (119.7 kg)  -Coreg 12.5 mg BID -Lasix 40 mg BID. -TED hose knee-high  Wolff-Parkinson-White syndrome -Currently in NSR -Propofenone 300mg  BID  Essential hypertension -See CHF  Ascending thoracic aortic aneurysm -9/9 CT abdomen/pelvis: Shows 4.2 cm aneurysm see results below -Requires annual imaging  Hypocalcemia  Hypokalemia -Potassium goal> 4   OSA/OHS -CPAP per respiratory  Deconditioning -Patient with acute on chronic respiratory failure and moderate to severe deconditioning requires 1-2 weeks inpatient rehabilitation. Evaluate for CIR   Goals of care -Patient continues to have dizziness on exertion, since he lives alone with no support system not safe for discharge to home. -9/18 reconsulted PT/OT evaluate for SNF  vs CIR. Have also spoken with LCSW Estanislado Emms concerning patient's need for placement.  -Patient agreeable for short-term stay at SNF until  he recovers strength.      DVT prophylaxis: Lovenox Code Status: Full Family Communication: None Disposition Plan: TBD   Consultants:  None    Procedures/Significant Events:  9/9 CT  angiogram chest PE protocol: -Negative PE . -4.2 cm Ascending Thoracic Aortic Aneurysm 4.2 cm ascending thoracic aortic aneurysm.   -Multifocal airspace opacities bilateral lung: Worse bibasilar and RUL pneumonia  vs atelectasis  9/9 Echocardiogram: LVEF= 60-65%.-Grade 1 diastolic dysfunction   I have personally reviewed and interpreted all radiology studies and my findings are as above.  VENTILATOR SETTINGS: None   Cultures 9/5 blood NGTD 9/5 MRSA by PCR negative 9/5 sputum normal flora 9/5 respiratory virus panel negative 9/5 HIV negative     Antimicrobials:Anti-infectives    Start     Stop   12/17/16 0900  vancomycin (VANCOCIN) IVPB 1000 mg/200 mL premix  Status:  Discontinued     12/21/16 1609   12/17/16 0300  piperacillin-tazobactam (ZOSYN) IVPB 3.375 g  Status:  Discontinued     12/23/16 1441   12/16/16 2030  piperacillin-tazobactam (ZOSYN) IVPB 3.375 g     12/16/16 2053   12/16/16 2030  vancomycin (VANCOCIN) 2,500 mg in sodium chloride 0.9 % 500 mL IVPB     12/16/16 2250   12/16/16 1615  cefTRIAXone (ROCEPHIN) 1 g in dextrose 5 % 50 mL IVPB     12/16/16 1809   12/16/16 1615  azithromycin (ZITHROMAX) 500 mg in dextrose 5 % 250 mL IVPB     12/16/16 1923       Devices    LINES / TUBES:      Continuous Infusions:    Objective: Vitals:   12/31/16 1640 12/31/16 1755 12/31/16 1955 12/31/16 2034  BP:  138/85  120/64  Pulse:  77  62  Resp:    17  Temp: 98.8 F (37.1 C)   97.9 F (36.6 C)  TempSrc: Oral   Oral  SpO2:  95% 97% 96%  Weight:      Height:        Intake/Output Summary (Last 24 hours) at 12/31/16 2040 Last data filed at 12/31/16 1600  Gross per 24 hour  Intake              540 ml  Output             1975 ml  Net            -1435 ml   Filed Weights   12/29/16 0521 12/30/16 0512 12/31/16 0617  Weight: 264 lb 6.4 oz (119.9 kg) 265 lb 12.8 oz (120.6 kg) 264 lb (119.7 kg)   Physical Exam:  General: A/O 4, positive acute or chronic  respiratory failure, (patient still required significant O2 support)  Neck:  Negative scars, masses, torticollis, lymphadenopathy, JVD Lungs: Clear to auscultation bilaterally without wheezes. Bibasilar crackles Cardiovascular: Regular rhythm And rate without murmur gallop or rub normal S1 and S2 Abdomen: Morbidly obese, negative abdominal pain, nondistended, positive soft, bowel sounds, no rebound, no ascites, no appreciable mass Extremities: No significant cyanosis, clubbing, or edema bilateral lower extremities Psychiatric:  Negative depression, positive anxiety, negative fatigue, negative mania  Central nervous system:  Cranial nerves II through XII intact, tongue/uvula midline, all extremities muscle strength 5/5, sensation intact throughout, negative dysarthria, negative expressive aphasia, negative receptive aphasia.     .     Data Reviewed: Care during the described time interval was provided by me .  I have reviewed this patient's available data, including medical  history, events of note, physical examination, and all test results as part of my evaluation.   CBC:  Recent Labs Lab 12/29/16 0304  WBC 10.0  HGB 13.6  HCT 43.4  MCV 93.3  PLT 696   Basic Metabolic Panel:  Recent Labs Lab 12/26/16 0340 12/28/16 0158 12/29/16 0304  NA 135 136 136  K 4.1 4.0 4.2  CL 97* 95* 97*  CO2 30 33* 32  GLUCOSE 112* 137* 119*  BUN 31* 34* 34*  CREATININE 1.39* 1.45* 1.37*  CALCIUM 8.1* 8.3* 8.5*   GFR: Estimated Creatinine Clearance: 59.5 mL/min (A) (by C-G formula based on SCr of 1.37 mg/dL (H)). Liver Function Tests: No results for input(s): AST, ALT, ALKPHOS, BILITOT, PROT, ALBUMIN in the last 168 hours. No results for input(s): LIPASE, AMYLASE in the last 168 hours. No results for input(s): AMMONIA in the last 168 hours. Coagulation Profile: No results for input(s): INR, PROTIME in the last 168 hours. Cardiac Enzymes: No results for input(s): CKTOTAL, CKMB, CKMBINDEX,  TROPONINI in the last 168 hours. BNP (last 3 results) No results for input(s): PROBNP in the last 8760 hours. HbA1C: No results for input(s): HGBA1C in the last 72 hours. CBG: No results for input(s): GLUCAP in the last 168 hours. Lipid Profile: No results for input(s): CHOL, HDL, LDLCALC, TRIG, CHOLHDL, LDLDIRECT in the last 72 hours. Thyroid Function Tests: No results for input(s): TSH, T4TOTAL, FREET4, T3FREE, THYROIDAB in the last 72 hours. Anemia Panel: No results for input(s): VITAMINB12, FOLATE, FERRITIN, TIBC, IRON, RETICCTPCT in the last 72 hours. Urine analysis:    Component Value Date/Time   COLORURINE YELLOW 12/24/2016 [place] 12/24/2016 1644   LABSPEC 1.005 12/24/2016 1644   PHURINE 7.0 12/24/2016 1644   GLUCOSEU NEGATIVE 12/24/2016 1644   HGBUR LARGE (A) 12/24/2016 1644   BILIRUBINUR NEGATIVE 12/24/2016 1644   KETONESUR NEGATIVE 12/24/2016 1644   PROTEINUR NEGATIVE 12/24/2016 1644   NITRITE NEGATIVE 12/24/2016 1644   LEUKOCYTESUR TRACE (A) 12/24/2016 1644   Sepsis Labs: @LABRCNTIP (procalcitonin:4,lacticidven:4)  ) Recent Results (from the past 240 hour(s))  Culture, Urine     Status: None   Collection Time: 12/24/16  4:44 PM  Result Value Ref Range Status   Specimen Description URINE, CLEAN CATCH  Final   Special Requests NONE  Final   Culture NO GROWTH  Final   Report Status 12/25/2016 FINAL  Final         Radiology Studies: No results found.      Scheduled Meds: . aspirin EC  81 mg Oral Daily  . carvedilol  12.5 mg Oral BID WC  . dextromethorphan-guaiFENesin  1 tablet Oral BID  . enoxaparin (LOVENOX) injection  60 mg Subcutaneous Daily  . fluticasone  2 spray Each Nare Daily  . furosemide  40 mg Oral BID  . hydrocortisone  25 mg Rectal BID  . mometasone-formoterol  2 puff Inhalation BID  . pantoprazole  40 mg Oral Daily  . potassium chloride  40 mEq Oral Daily  . predniSONE  20 mg Oral Q breakfast  . propafenone  300  mg Oral BID  . rosuvastatin  10 mg Oral QHS  . tetrahydrozoline  1 drop Both Eyes Daily  . tiotropium  18 mcg Inhalation Daily   Continuous Infusions:    LOS: 15 days    Time spent: 40 minutes    WOODS, Geraldo Docker, MD [facility] Pager [phone]   If 7PM-7AM, please contact night-coverage [email or web address] Password [email or web address] 12/31/2016, 8:40  PM

## 2016-12-31 NOTE — Plan of Care (Signed)
Problem: Health Behavior/Discharge Planning: Goal: Ability to manage health-related needs will improve Outcome: Completed/Met Date Met: 12/31/16 Patient denying pain at this time, will be D/C'd in AM.

## 2016-12-31 NOTE — Plan of Care (Signed)
Problem: Safety: Goal: Ability to remain free from injury will improve Outcome: Completed/Met Date Met: 12/31/16 Pt using call light and calling out as instructed, all personal items within reach.

## 2016-12-31 NOTE — Care Management Important Message (Signed)
Important Message  Patient Details  Name: Chad Hogan MRN: [id number] Date of Birth: 18-Jan-1941   Medicare Important Message Given:  Yes    Nathen May 12/31/2016, 10:42 AM

## 2017-01-01 DIAGNOSIS — E662 Morbid (severe) obesity with alveolar hypoventilation: Secondary | ICD-10-CM

## 2017-01-01 DIAGNOSIS — J189 Pneumonia, unspecified organism: Secondary | ICD-10-CM

## 2017-01-01 DIAGNOSIS — I5032 Chronic diastolic (congestive) heart failure: Secondary | ICD-10-CM

## 2017-01-01 DIAGNOSIS — I7121 Aneurysm of the ascending aorta, without rupture: Secondary | ICD-10-CM

## 2017-01-01 DIAGNOSIS — G4733 Obstructive sleep apnea (adult) (pediatric): Secondary | ICD-10-CM

## 2017-01-01 DIAGNOSIS — Z9989 Dependence on other enabling machines and devices: Secondary | ICD-10-CM

## 2017-01-01 DIAGNOSIS — I712 Thoracic aortic aneurysm, without rupture: Secondary | ICD-10-CM

## 2017-01-01 DIAGNOSIS — I714 Abdominal aortic aneurysm, without rupture: Secondary | ICD-10-CM

## 2017-01-01 DIAGNOSIS — J441 Chronic obstructive pulmonary disease with (acute) exacerbation: Secondary | ICD-10-CM

## 2017-01-01 DIAGNOSIS — A419 Sepsis, unspecified organism: Secondary | ICD-10-CM

## 2017-01-01 DIAGNOSIS — I456 Pre-excitation syndrome: Secondary | ICD-10-CM

## 2017-01-01 LAB — BASIC METABOLIC PANEL
ANION GAP: 9 (ref 5–15)
BUN: 33 mg/dL — ABNORMAL HIGH (ref 6–20)
CALCIUM: 8.4 mg/dL — AB (ref 8.9–10.3)
CO2: 27 mmol/L (ref 22–32)
CREATININE: 1.38 mg/dL — AB (ref 0.61–1.24)
Chloride: 99 mmol/L — ABNORMAL LOW (ref 101–111)
GFR calc Af Amer: 55 mL/min — ABNORMAL LOW (ref >60)
GFR, EST NON AFRICAN AMERICAN: 48 mL/min — AB (ref >60)
GLUCOSE: 81 mg/dL (ref 65–99)
Potassium: 4.1 mmol/L (ref 3.5–5.1)
Sodium: 135 mmol/L (ref 135–145)

## 2017-01-01 LAB — MAGNESIUM: Magnesium: 2.2 mg/dL (ref 1.7–2.4)

## 2017-01-01 MED ORDER — HYDROCORTISONE ACETATE 25 MG RE SUPP: 25.0000 mg | Freq: Two times a day (BID) | RECTAL | 0 refills | Status: DC

## 2017-01-01 MED ORDER — ROSUVASTATIN CALCIUM 10 MG PO TABS: 10.0000 mg | ORAL_TABLET | Freq: Every day | ORAL | 0 refills | Status: DC

## 2017-01-01 MED ORDER — PROPAFENONE HCL 300 MG PO TABS: 300.0000 mg | ORAL_TABLET | Freq: Two times a day (BID) | ORAL | 0 refills | Status: DC

## 2017-01-01 MED ORDER — CARVEDILOL 12.5 MG PO TABS: 12.5000 mg | ORAL_TABLET | Freq: Two times a day (BID) | ORAL | 0 refills | Status: DC

## 2017-01-01 MED ORDER — OMEPRAZOLE 20 MG PO CPDR: 20.0000 mg | DELAYED_RELEASE_CAPSULE | Freq: Every day | ORAL | 0 refills | Status: DC

## 2017-01-01 MED ORDER — FERROUS SULFATE 325 (65 FE) MG PO TABS: 325.0000 mg | ORAL_TABLET | Freq: Every day | ORAL | 0 refills | Status: DC

## 2017-01-01 MED ORDER — PREDNISONE 20 MG PO TABS: 20.0000 mg | ORAL_TABLET | Freq: Every day | ORAL | 0 refills | Status: DC

## 2017-01-01 MED ORDER — POTASSIUM CHLORIDE CRYS ER 20 MEQ PO TBCR: 40.0000 meq | EXTENDED_RELEASE_TABLET | Freq: Every day | ORAL | 0 refills | Status: DC

## 2017-01-01 MED ORDER — OXYMETAZOLINE HCL 0.05 % NA SOLN: 1.0000 | Freq: Two times a day (BID) | NASAL | 0 refills | Status: DC | PRN

## 2017-01-01 MED ORDER — TETRAHYDROZOLINE HCL 0.05 % OP SOLN: 1.0000 [drp] | Freq: Every day | OPHTHALMIC | 0 refills | Status: DC

## 2017-01-01 MED ORDER — ALBUTEROL SULFATE (2.5 MG/3ML) 0.083% IN NEBU: 2.5000 mg | INHALATION_SOLUTION | RESPIRATORY_TRACT | 0 refills | Status: DC | PRN

## 2017-01-01 MED ORDER — TIOTROPIUM BROMIDE MONOHYDRATE 18 MCG IN CAPS: 18.0000 ug | ORAL_CAPSULE | Freq: Every day | RESPIRATORY_TRACT | 0 refills | Status: DC

## 2017-01-01 MED ORDER — FUROSEMIDE 40 MG PO TABS: 40.0000 mg | ORAL_TABLET | Freq: Two times a day (BID) | ORAL | 0 refills | Status: DC

## 2017-01-01 MED ORDER — VITAMIN C 500 MG PO TABS: 500.0000 mg | ORAL_TABLET | Freq: Every day | ORAL | 0 refills | Status: DC

## 2017-01-01 MED ORDER — ONDANSETRON HCL 4 MG PO TABS: 4.0000 mg | ORAL_TABLET | Freq: Four times a day (QID) | ORAL | 0 refills | Status: DC | PRN

## 2017-01-01 MED ORDER — FLUTICASONE PROPIONATE 50 MCG/ACT NA SUSP: 2.0000 | Freq: Every day | NASAL | 0 refills | Status: DC

## 2017-01-01 MED ORDER — FLUTICASONE-SALMETEROL 250-50 MCG/DOSE IN AEPB: 1.0000 | INHALATION_SPRAY | Freq: Two times a day (BID) | RESPIRATORY_TRACT | 0 refills | Status: DC

## 2017-01-01 NOTE — Plan of Care (Signed)
Problem: Physical Regulation: Goal: Ability to maintain clinical measurements within normal limits will improve Outcome: Progressing VSS   

## 2017-01-01 NOTE — Progress Notes (Signed)
Pt d/c home in stable condition.  HH has been set up/ordered.  2 full Oxygen tanks are at bedside and pt/daughter will take home.  All d/c paperwork and medications were reviewed in detail with pt and family member.  All prescriptions placed in d/c folder and handed to pt.

## 2017-01-01 NOTE — Progress Notes (Signed)
Physical Therapy Treatment Patient Details Name: Chad Hogan MRN: [id number] DOB: 04/15/1940 Today's Date: 01/01/2017    History of Present Illness Patient is a 76 yo male admitted 9/5 with SOB and PNA. 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     PT Comments    Pt continues to progress well with mobility. Pt amb modified independent in hallway pushing O2 tank. Requires rest breaks due to decr activity tolerance. Pt reports he is "fuzzy headed" but denies any dizziness or spinning. Head turns performed without any changes. Question if this "fuzzy" feeling could be related to a medication. Pt amb on 6L of O2 with SpO2 88-92%. Pt also able to complete stairs.  Spoke to pt concerning pacing himself at home. Pt acknowledged that his home is small. Likely that he is ambulating longer distances here than he does in his house. Encouraged pt to make a specific effort to walk longer distances at home several times a day to build endurance.  Follow Up Recommendations  Home health PT;Other (comment) (eventual outpatient pulmonary rehab)     Equipment Recommendations  None recommended by PT    Recommendations for Other Services       Precautions / Restrictions Precautions Precautions: Other (comment) Precaution Comments: watch 02 sats Restrictions Weight Bearing Restrictions: No    Mobility  Bed Mobility Overal bed mobility: Modified Independent                Transfers Overall transfer level: Modified independent Equipment used: None Transfers: Sit to/from Stand Sit to Stand: Modified independent (Device/Increase time)            Ambulation/Gait Ambulation/Gait assistance: Modified independent (Device/Increase time) Ambulation Distance (Feet): 400 Feet (1 sitting rest break and 2 standing rest breaks) Assistive device: None (pushing 02 tank) Gait Pattern/deviations:  Step-through pattern;Decreased stride length Gait velocity: decr Gait velocity interpretation: Below normal speed for age/gender General Gait Details: Pt with steady gait and good management of O2 tank. Amb on 6L/min 02 with SP02 ranging from 88-92%.    Stairs Stairs: Yes   Stair Management: Two rails;Alternating pattern;Forwards;Backwards (Up forwards, down backwards) Number of Stairs: 3    Wheelchair Mobility    Modified Rankin (Stroke Patients Only)       Balance Overall balance assessment: Needs assistance Sitting-balance support: No upper extremity supported;Feet supported Sitting balance-Leahy Scale: Normal     Standing balance support: No upper extremity supported Standing balance-Leahy Scale: Good                              Cognition Arousal/Alertness: Awake/alert Behavior During Therapy: WFL for tasks assessed/performed Overall Cognitive Status: Within Functional Limits for tasks assessed                                        Exercises      General Comments        Pertinent Vitals/Pain      Home Living                      Prior Function            PT Goals (current goals can now be found in the care plan section) Progress towards PT goals: Goals met and  updated - see care plan    Frequency    Min 3X/week      PT Plan Discharge plan needs to be updated    Co-evaluation              AM-PAC PT "6 Clicks" Daily Activity  Outcome Measure  Difficulty turning over in bed (including adjusting bedclothes, sheets and blankets)?: None Difficulty moving from lying on back to sitting on the side of the bed? : None Difficulty sitting down on and standing up from a chair with arms (e.g., wheelchair, bedside commode, etc,.)?: None Help needed moving to and from a bed to chair (including a wheelchair)?: None Help needed walking in hospital room?: None Help needed climbing 3-5 steps with a railing? :  None 6 Click Score: 24    End of Session Equipment Utilized During Treatment: Oxygen Activity Tolerance: Patient tolerated treatment well Patient left: in chair Nurse Communication: Mobility status PT Visit Diagnosis: Difficulty in walking, not elsewhere classified (R26.2)     Time: 8358-4465 PT Time Calculation (min) (ACUTE ONLY): 20 min  Charges:  $Gait Training: 8-22 mins                    G Codes:       [facility] PT [place] 01/01/2017, 12:07 PM

## 2017-01-01 NOTE — Progress Notes (Signed)
Thank you for consult on Chad Hogan. Chart reviewed and note that patient lives alone. He is modified independent for transfers and is able to ambulate 150' with supervision. He is too high level for CIR and concur with plans for SNF to give patient time to help with endurance issues.

## 2017-01-03 DIAGNOSIS — J441 Chronic obstructive pulmonary disease with (acute) exacerbation: Secondary | ICD-10-CM | POA: Diagnosis not present

## 2017-01-03 DIAGNOSIS — Z9981 Dependence on supplemental oxygen: Secondary | ICD-10-CM | POA: Diagnosis not present

## 2017-01-03 DIAGNOSIS — Z9181 History of falling: Secondary | ICD-10-CM | POA: Diagnosis not present

## 2017-01-03 DIAGNOSIS — I259 Chronic ischemic heart disease, unspecified: Secondary | ICD-10-CM | POA: Diagnosis not present

## 2017-01-03 DIAGNOSIS — I10 Essential (primary) hypertension: Secondary | ICD-10-CM | POA: Diagnosis not present

## 2017-01-03 DIAGNOSIS — E785 Hyperlipidemia, unspecified: Secondary | ICD-10-CM | POA: Diagnosis not present

## 2017-01-04 ENCOUNTER — Other Ambulatory Visit: Payer: Self-pay

## 2017-01-04 DIAGNOSIS — I10 Essential (primary) hypertension: Secondary | ICD-10-CM | POA: Diagnosis not present

## 2017-01-04 DIAGNOSIS — I259 Chronic ischemic heart disease, unspecified: Secondary | ICD-10-CM | POA: Diagnosis not present

## 2017-01-04 DIAGNOSIS — Z9181 History of falling: Secondary | ICD-10-CM | POA: Diagnosis not present

## 2017-01-04 DIAGNOSIS — J441 Chronic obstructive pulmonary disease with (acute) exacerbation: Secondary | ICD-10-CM | POA: Diagnosis not present

## 2017-01-04 DIAGNOSIS — Z9981 Dependence on supplemental oxygen: Secondary | ICD-10-CM | POA: Diagnosis not present

## 2017-01-04 DIAGNOSIS — E785 Hyperlipidemia, unspecified: Secondary | ICD-10-CM | POA: Diagnosis not present

## 2017-01-04 NOTE — Patient Outreach (Signed)
Transition of care call:  Discharged on 01/01/2017.  [name] who reports that he is doing well. Reports that his breathing is getting better.  Reports no follow up appointments.  States that he has seen 3 nurses with home health since he got home. Reports that he is working with physical therapy.  States that the nurses have his medication correct and that he is taking his medications as prescribed.  Reports that he is on 4.5 liters of oxygen.  Reports no follow up yet and states that the home health nurse is getting him a follow up appointment.  Patient reports that he has good support with his daughter.  Denies any concerns today.   Plan: Offered home visit for 01/06/2017 and patient has accepted. Confirmed address and provided my contact information.  Will review goals at home visit.  Encouraged patient to take all medications as prescribed.   Tomasa Rand, RN, BSN, CEN [facility] [employer] [phone]

## 2017-01-05 DIAGNOSIS — I259 Chronic ischemic heart disease, unspecified: Secondary | ICD-10-CM | POA: Diagnosis not present

## 2017-01-05 DIAGNOSIS — E785 Hyperlipidemia, unspecified: Secondary | ICD-10-CM | POA: Diagnosis not present

## 2017-01-05 DIAGNOSIS — Z9981 Dependence on supplemental oxygen: Secondary | ICD-10-CM | POA: Diagnosis not present

## 2017-01-05 DIAGNOSIS — Z9181 History of falling: Secondary | ICD-10-CM | POA: Diagnosis not present

## 2017-01-05 DIAGNOSIS — J441 Chronic obstructive pulmonary disease with (acute) exacerbation: Secondary | ICD-10-CM | POA: Diagnosis not present

## 2017-01-05 DIAGNOSIS — I10 Essential (primary) hypertension: Secondary | ICD-10-CM | POA: Diagnosis not present

## 2017-01-06 ENCOUNTER — Other Ambulatory Visit: Payer: Self-pay

## 2017-01-06 DIAGNOSIS — Z9981 Dependence on supplemental oxygen: Secondary | ICD-10-CM | POA: Diagnosis not present

## 2017-01-06 DIAGNOSIS — Z9181 History of falling: Secondary | ICD-10-CM | POA: Diagnosis not present

## 2017-01-06 DIAGNOSIS — E785 Hyperlipidemia, unspecified: Secondary | ICD-10-CM | POA: Diagnosis not present

## 2017-01-06 DIAGNOSIS — I10 Essential (primary) hypertension: Secondary | ICD-10-CM | POA: Diagnosis not present

## 2017-01-06 DIAGNOSIS — I259 Chronic ischemic heart disease, unspecified: Secondary | ICD-10-CM | POA: Diagnosis not present

## 2017-01-06 DIAGNOSIS — J441 Chronic obstructive pulmonary disease with (acute) exacerbation: Secondary | ICD-10-CM | POA: Diagnosis not present

## 2017-01-06 NOTE — Patient Outreach (Addendum)
[facility]) Care Management   01/06/2017  Chad Hogan [birth year] [id number]  Chad Hogan is an 76 y.o. male  Subjective:  Discharged from hospital on 12/31/2016.  Patient reports that he was sent home due to [facility] refusing admission to rehab due to his ability to walk too far.  Patient lives alone with good support from daughter.  Home health and PT seeing patient daily.  ( [facility]).  No nebulizer at this time but this has been ordered.  Using a manual scale and has difficulty weighing.  Weight increased by 4 pounds in 2 days.  Amber with [facility] calling MD to report.  Patient reports that he is able to self manage his medications without problems.  Reports that he feels like his breathing is improved.  Discharged home on 5 liters decreased to 3.5 today.    Objective:  Awake and alert.  Sitting in recliner. Ambulating without any assistive devices.  Does not appear to be short of breath while at rest.  Rales noted in bilateral posterior lung bases. Patient report that he is sweaty but room temperature in the living room was 77.  Vitals:   01/06/17 1038  BP: (!) 145/82  Pulse: 68  Resp: (!) 22  SpO2: 91%  Weight: 264 lb (119.7 kg)  Height: 1.803 m (5\' 11" )   Review of Systems  Constitutional: Positive for diaphoresis.  Eyes:       Wearing glasses  Respiratory: Negative.        Wearing oxygen. Reduced today by PT from 4 to 3.5 liters  Cardiovascular: Positive for leg swelling.  Gastrointestinal: Negative.   Genitourinary: Positive for frequency.  Musculoskeletal: Negative.   Skin: Negative.   Neurological: Positive for weakness.       Reports hands shake   Endo/Heme/Allergies: Bruises/bleeds easily.  Psychiatric/Behavioral: Negative.     Physical Exam  Constitutional: He is oriented to person, place, and time. He appears well-developed and well-nourished.  Cardiovascular: Normal rate, normal heart sounds and intact distal pulses.   Respiratory: Effort normal.  He has rales.  Rales to bases.  Amber with Home health has called MD to report  GI: Soft. Bowel sounds are normal.  Musculoskeletal: Normal range of motion. He exhibits edema.  1 plus to legs  Neurological: He is alert and oriented to person, place, and time.  Skin: Skin is warm.  Clammy skin  Psychiatric: He has a normal mood and affect. His behavior is normal. Judgment and thought content normal.    Encounter Medications:   Outpatient Encounter Prescriptions as of 01/06/2017  Medication Sig  . ALPRAZolam (XANAX) 1 MG tablet Take 1 tablet (1 mg total) by mouth 2 (two) times daily as needed for anxiety.  Marland Kitchen aspirin EC 81 MG tablet Take 1 tablet (81 mg total) by mouth daily. Do not fill  . carvedilol (COREG) 12.5 MG tablet Take 1 tablet (12.5 mg total) by mouth 2 (two) times daily with a meal.  . ferrous sulfate 325 (65 FE) MG tablet Take 1 tablet (325 mg total) by mouth daily.  . Fluticasone-Salmeterol (ADVAIR DISKUS) 250-50 MCG/DOSE AEPB Inhale 1 puff into the lungs every 12 (twelve) hours.  . furosemide (LASIX) 40 MG tablet Take 1 tablet (40 mg total) by mouth 2 (two) times daily.  . hydrocortisone (ANUSOL-HC) 25 MG suppository Place 1 suppository (25 mg total) rectally 2 (two) times daily.  . Methylcobalamin (B-12) 5000 MCG TBDP Take 5,000 Units by mouth daily.  Marland Kitchen omeprazole (PRILOSEC)  20 MG capsule Take 1 capsule (20 mg total) by mouth daily.  Marland Kitchen OVER THE COUNTER MEDICATION Inhale 1 application into the lungs at bedtime. CPAP with O2  . potassium chloride SA (K-DUR,KLOR-CON) 20 MEQ tablet Take 2 tablets (40 mEq total) by mouth daily.  . predniSONE (DELTASONE) 20 MG tablet Take 1 tablet (20 mg total) by mouth daily with breakfast.  . propafenone (RYTHMOL) 300 MG tablet Take 1 tablet (300 mg total) by mouth 2 (two) times daily.  . rosuvastatin (CRESTOR) 10 MG tablet Take 1 tablet (10 mg total) by mouth at bedtime.  Marland Kitchen tiotropium (SPIRIVA) 18 MCG inhalation capsule Place 1 capsule (18 mcg  total) into inhaler and inhale daily.  . vitamin C (ASCORBIC ACID) 500 MG tablet Take 1 tablet (500 mg total) by mouth daily.  Marland Kitchen acetaminophen (TYLENOL) 325 MG tablet Take 2 tablets (650 mg total) by mouth every 6 (six) hours as needed for mild pain (or Fever >/= 101). (Patient not taking: Reported on 01/06/2017)  . albuterol (PROVENTIL) (2.5 MG/3ML) 0.083% nebulizer solution Take 3 mLs (2.5 mg total) by nebulization every 4 (four) hours as needed for wheezing or shortness of breath. (Patient not taking: Reported on 01/06/2017)  . fluticasone (FLONASE) 50 MCG/ACT nasal spray Place 2 sprays into both nostrils daily. (Patient not taking: Reported on 01/06/2017)  . ondansetron (ZOFRAN) 4 MG tablet Take 1 tablet (4 mg total) by mouth every 6 (six) hours as needed for nausea. (Patient not taking: Reported on 01/06/2017)  . oxymetazoline (AFRIN) 0.05 % nasal spray Place 1 spray into both nostrils 2 (two) times daily as needed for congestion. (Patient not taking: Reported on 01/06/2017)  . sodium chloride (OCEAN) 0.65 % SOLN nasal spray Place 2 sprays into both nostrils as needed for congestion. (Patient not taking: Reported on 01/06/2017)  . tetrahydrozoline (REDNESS RELIEVER EYE DROPS) 0.05 % ophthalmic solution Place 1 drop into both eyes daily. (Patient not taking: Reported on 01/06/2017)   No facility-administered encounter medications on file as of 01/06/2017.     Functional Status:   In your present state of health, do you have any difficulty performing the following activities: 01/06/2017 12/17/2016  Hearing? Y N  Comment left ear with hard of hearing. -  Vision? Y N  Difficulty concentrating or making decisions? N N  Comment - -  Walking or climbing stairs? Y N  Dressing or bathing? N N  Doing errands, shopping? N Y  Conservation officer, nature and eating ? N -  Using the Toilet? N -  In the past six months, have you accidently leaked urine? N -  Do you have problems with loss of bowel control? N -  Managing  your Medications? N -  Managing your Finances? N -  Housekeeping or managing your Housekeeping? Y -  Comment daughter cleans house -  Some recent data might be hidden    Fall/Depression Screening:    Fall Risk  01/06/2017  Falls in the past year? No   PHQ 2/9 Scores 01/06/2017  PHQ - 2 Score 0    Assessment:  (1) reviewed [facility] program and transition of care. Provided new patient packet, magnet, [facility] calendar and my contact card.  Reviewed consent form and patient signed.  (2) CHF: noted 4 pound weight gain, rales in lung bases. 1 plus edema to legs. Old manual scale. Patient with difficulty seeing how to read scale. (3) no advanced directives (4) no nebulizer.  (5) no follow up appointment with primary MD, cardiology  or pulmonary.  (6)  Active with [place] ( [address])  and PTMarden Noble) daily for 1 week post discharge.  Plan:  (1) consent scanned into chart.  (2) reviewed CHF zones with patient.  Encouraged patient to follow low salt diet. Reviewed importance of daily weights.  Confirmed with home health nurse that she called MD office during my home visit. (3) provided advanced directive packet and reviewed with patient how to complete. (4) reviewed with [facility] nurse that nebulizer has been ordered. (5) reviewed with patient the importance of follow up appointments.  Landry Corporal will get primary MD follow up appointment. I offered to make other appointments but patient wanted to do these himself.  (6) Encouraged patient to call home health nurse for any worsening symptoms. Encouraged patient to do home exercises as requested by PT.  Care planning and goal setting during home visit and primary goal is avoid a readmission.  Barrier letter and this note sent to MD. Next outreach planned for 1 week via phone.  [facility] CM Care Plan Problem One     Most Recent Value  Care Plan Problem One  Recent admission for CHF and COPD  Role Documenting the Problem One  Care Management [place] for Problem One  Active  [facility]N Long Term Goal   Patient will report no readmission for the next 60 days.   [facility]N Long Term Goal Start Date  01/06/17  Interventions for Problem One Long Term Goal  Reviewed discharge instructions. Reviewed [facility] program. Provided my contact card.   [facility] CM Short Term Goal #1   Patient will report following up with primary MD and specialist within 2 weeks.   [facility] CM Short Term Goal #1 Start Date  01/06/17  Interventions for Short Term Goal #1  Reviewed importance of timely follow up.   [facility]N CM Short Term Goal #2   Patient will report weighing daily for the next 30 days.   [facility]N CM Short Term Goal #2 Start Date  01/06/17  Interventions for Short Term Goal #2  Reviewed heart failure zones and when to call MD. Encouraged patient to follow low salt diet.   [facility]N CM Short Term Goal #3  Patient will report receiving nebulizer in the next 7 days.   [facility]N CM Short Term Goal #3 Start Date  01/06/17  Interventions for Short Tern Goal #3  Reviewed when to use nebulizer. Encouraged patient to ask questions on how to use nebulizer when it is delivered. Confirmed that machine was ordered.       Tomasa Rand, RN, BSN, CEN [facility] [employer] [phone]

## 2017-01-07 DIAGNOSIS — G4733 Obstructive sleep apnea (adult) (pediatric): Secondary | ICD-10-CM | POA: Diagnosis not present

## 2017-01-07 DIAGNOSIS — J441 Chronic obstructive pulmonary disease with (acute) exacerbation: Secondary | ICD-10-CM | POA: Diagnosis not present

## 2017-01-07 DIAGNOSIS — I10 Essential (primary) hypertension: Secondary | ICD-10-CM | POA: Diagnosis not present

## 2017-01-07 DIAGNOSIS — Z9981 Dependence on supplemental oxygen: Secondary | ICD-10-CM | POA: Diagnosis not present

## 2017-01-07 DIAGNOSIS — I259 Chronic ischemic heart disease, unspecified: Secondary | ICD-10-CM | POA: Diagnosis not present

## 2017-01-07 DIAGNOSIS — Z9181 History of falling: Secondary | ICD-10-CM | POA: Diagnosis not present

## 2017-01-07 DIAGNOSIS — E785 Hyperlipidemia, unspecified: Secondary | ICD-10-CM | POA: Diagnosis not present

## 2017-01-08 DIAGNOSIS — E785 Hyperlipidemia, unspecified: Secondary | ICD-10-CM | POA: Diagnosis not present

## 2017-01-08 DIAGNOSIS — Z9181 History of falling: Secondary | ICD-10-CM | POA: Diagnosis not present

## 2017-01-08 DIAGNOSIS — J441 Chronic obstructive pulmonary disease with (acute) exacerbation: Secondary | ICD-10-CM | POA: Diagnosis not present

## 2017-01-08 DIAGNOSIS — Z9981 Dependence on supplemental oxygen: Secondary | ICD-10-CM | POA: Diagnosis not present

## 2017-01-08 DIAGNOSIS — I10 Essential (primary) hypertension: Secondary | ICD-10-CM | POA: Diagnosis not present

## 2017-01-08 DIAGNOSIS — I259 Chronic ischemic heart disease, unspecified: Secondary | ICD-10-CM | POA: Diagnosis not present

## 2017-01-11 DIAGNOSIS — E785 Hyperlipidemia, unspecified: Secondary | ICD-10-CM | POA: Diagnosis not present

## 2017-01-11 DIAGNOSIS — I259 Chronic ischemic heart disease, unspecified: Secondary | ICD-10-CM | POA: Diagnosis not present

## 2017-01-11 DIAGNOSIS — J441 Chronic obstructive pulmonary disease with (acute) exacerbation: Secondary | ICD-10-CM | POA: Diagnosis not present

## 2017-01-11 DIAGNOSIS — I10 Essential (primary) hypertension: Secondary | ICD-10-CM | POA: Diagnosis not present

## 2017-01-11 DIAGNOSIS — Z9181 History of falling: Secondary | ICD-10-CM | POA: Diagnosis not present

## 2017-01-11 DIAGNOSIS — Z9981 Dependence on supplemental oxygen: Secondary | ICD-10-CM | POA: Diagnosis not present

## 2017-01-12 ENCOUNTER — Other Ambulatory Visit: Payer: Self-pay

## 2017-01-12 NOTE — Patient Outreach (Signed)
Transition of care:  Attempted x 2 to reach patient for transition of care. Unsuccessful. Left a message requesting a call back.  PLAN: will continue weekly transition of care calls.  Tomasa Rand, RN, BSN, CEN [facility] [employer] [phone]

## 2017-01-13 DIAGNOSIS — J441 Chronic obstructive pulmonary disease with (acute) exacerbation: Secondary | ICD-10-CM | POA: Diagnosis not present

## 2017-01-13 DIAGNOSIS — Z9181 History of falling: Secondary | ICD-10-CM | POA: Diagnosis not present

## 2017-01-13 DIAGNOSIS — E785 Hyperlipidemia, unspecified: Secondary | ICD-10-CM | POA: Diagnosis not present

## 2017-01-13 DIAGNOSIS — I259 Chronic ischemic heart disease, unspecified: Secondary | ICD-10-CM | POA: Diagnosis not present

## 2017-01-13 DIAGNOSIS — Z9981 Dependence on supplemental oxygen: Secondary | ICD-10-CM | POA: Diagnosis not present

## 2017-01-13 DIAGNOSIS — I10 Essential (primary) hypertension: Secondary | ICD-10-CM | POA: Diagnosis not present

## 2017-01-14 ENCOUNTER — Ambulatory Visit: Payer: Medicare HMO | Admitting: Physician Assistant

## 2017-01-14 ENCOUNTER — Ambulatory Visit (INDEPENDENT_AMBULATORY_CARE_PROVIDER_SITE_OTHER): Payer: Medicare HMO | Admitting: Physician Assistant

## 2017-01-14 VITALS — BP 118/76 | HR 72 | Ht 71.0 in | Wt 263.0 lb

## 2017-01-14 DIAGNOSIS — R002 Palpitations: Secondary | ICD-10-CM | POA: Diagnosis not present

## 2017-01-14 DIAGNOSIS — I456 Pre-excitation syndrome: Secondary | ICD-10-CM | POA: Diagnosis not present

## 2017-01-14 DIAGNOSIS — I5032 Chronic diastolic (congestive) heart failure: Secondary | ICD-10-CM

## 2017-01-14 DIAGNOSIS — I10 Essential (primary) hypertension: Secondary | ICD-10-CM

## 2017-01-14 DIAGNOSIS — I471 Supraventricular tachycardia: Secondary | ICD-10-CM

## 2017-01-14 MED ORDER — CARVEDILOL 25 MG PO TABS: 25.0000 mg | ORAL_TABLET | Freq: Two times a day (BID) | ORAL | 3 refills | Status: DC

## 2017-01-14 NOTE — Patient Instructions (Signed)
Medication Instructions:   START TAKING CARVEDILOL 25 MG TWICE A  DAY   If you need a refill on your cardiac medications before your next appointment, please call your pharmacy.  Labwork: NONE ORDERED  TODAY   Testing/Procedures:  Your physician has recommended that you wear an event monitor. Event monitors are medical devices that record the heart's electrical activity. Doctors most often [place] these monitors to diagnose arrhythmias. Arrhythmias are problems with the speed or rhythm of the heartbeat. The monitor is a small, portable device. You can wear one while you do your normal daily activities. This is usually used to diagnose what is causing palpitations/syncope (passing out).   Follow-Up:  IN 2 MONTHS WITH URSUY    Any Other Special Instructions Will Be Listed Below (If Applicable).

## 2017-01-14 NOTE — Progress Notes (Signed)
Cardiology Office Note Date:  01/14/2017  Patient ID:  Chad Hogan, Chad Hogan [birth year], MRN [id number] PCP:  Myer Peer, MD  Electrophysiologist:  Dr. Lovena Le   Chief Complaint: hospital f/u  History of Present Illness: Chad Hogan is a 76 y.o. male with history of HTN, HLD, COPD chronic home O2, CRI (III), WPW SVT w/unsuccessful ablation, chronic CHF (diastolic).  He comes in today to be seen for Dr. Lovena Le, he last saw him in August, noted his SVT was secondary to antidromic AV reentrant tachycardia. During his ablation, he could make his accessory pathway go away, but then it would come back. He was placed on propafenone therapy and had done well.  Mot recently was hospitalized with resp insufficiency 2/2 multifocal pneumonia, COPD exacerbation, record indicated an observed episode of VT, also noted to have 4.2 ascending aortic aneurysm recommended for annual imaging.  He was admitted for 13 days, discharged on 12/29/16  Cardiac-wise he feels like he has done really well until a few days ago.  He has not had any palpitions at all until last week.  9/28 he had HR by his pulse ox 137bpm for 15 minutes, 9/30 127bpm for 63minutes, 10/2 was 129bpm 4 minutes, and yesterday 124-127 unknown duration and briefly again in the evening, none today.  He feels like his pulm status is at his baseline, post hospitalization, getting home RN and PT.  The fast rates gave him an  uneasy feeling, no CP or  Change in breathing, no dizziness or syncopewas wearing his O2 with sats about 94%..  He has been taking his medicines as rx, without missed doses.  AAD: Intolerant of flecainide Currently Rhythmol  Past Medical History:  Diagnosis Date  . Anxiety   . CAP (community acquired pneumonia) 12/2016  . CKD (chronic kidney disease) stage 3, GFR 30-59 ml/min   . COPD (chronic obstructive pulmonary disease) ([place])   . Diastolic CHF, chronic ([place])   . Dyspnea   . HLD (hyperlipidemia)   . HTN (hypertension)     . Ischemic heart disease   . Obesity   . OSA (obstructive sleep apnea)   . Palpitation   . Pulmonary embolism ([place])   . PVC (premature ventricular contraction)   . SOB (shortness of breath)   . SVT (supraventricular tachycardia) ([place])   . Umbilical hernia   . WPW (Wolff-Parkinson-White syndrome)     Past Surgical History:  Procedure Laterality Date  . CARDIAC CATHETERIZATION N/A 03/16/2016   Procedure: Left Heart Cath and Coronary Angiography;  Surgeon: Troy Sine, MD;  Location: [place] CV [facility];  Service: Cardiovascular;  Laterality: N/A;  . COLONOSCOPY WITH PROPOFOL N/A 06/11/2014   Procedure: COLONOSCOPY WITH PROPOFOL;  Surgeon: Inda Castle, MD;  Location: [facility];  Service: Endoscopy;  Laterality: N/A;  . ELECTROPHYSIOLOGIC STUDY N/A 03/23/2016   Procedure: Electrophysiology Study;  Surgeon: Evans Lance, MD;  Location: [place] CV LAB;  Service: Cardiovascular;  Laterality: N/A;  . ELECTROPHYSIOLOGIC STUDY N/A 03/23/2016   Procedure: SVT Ablation;  Surgeon: Evans Lance, MD;  Location: [place] CV LAB;  Service: Cardiovascular;  Laterality: N/A;  . ESOPHAGOGASTRODUODENOSCOPY (EGD) WITH PROPOFOL N/A 06/11/2014   Procedure: ESOPHAGOGASTRODUODENOSCOPY (EGD) WITH PROPOFOL;  Surgeon: Inda Castle, MD;  Location: [facility];  Service: Endoscopy;  Laterality: N/A;  . HERNIA REPAIR      Current Outpatient Prescriptions  Medication Sig Dispense Refill  . acetaminophen (TYLENOL) 325 MG tablet Take 2 tablets (650 mg  total) by mouth every 6 (six) hours as needed for mild pain (or Fever >/= 101).    Marland Kitchen albuterol (PROVENTIL) (2.5 MG/3ML) 0.083% nebulizer solution Take 3 mLs (2.5 mg total) by nebulization every 4 (four) hours as needed for wheezing or shortness of breath. 75 mL 0  . ALPRAZolam (XANAX) 1 MG tablet Take 1 tablet (1 mg total) by mouth 2 (two) times daily as needed for anxiety. 10 tablet 0  . aspirin EC 81 MG tablet Take 1 tablet (81 mg total) by mouth  daily. Do not fill 90 tablet 3  . carvedilol (COREG) 12.5 MG tablet Take 1 tablet (12.5 mg total) by mouth 2 (two) times daily with a meal. 60 tablet 0  . ferrous sulfate 325 (65 FE) MG tablet Take 1 tablet (325 mg total) by mouth daily. 30 tablet 0  . fluticasone (FLONASE) 50 MCG/ACT nasal spray Place 2 sprays into both nostrils daily. 16 g 0  . Fluticasone-Salmeterol (ADVAIR DISKUS) 250-50 MCG/DOSE AEPB Inhale 1 puff into the lungs every 12 (twelve) hours. 60 each 0  . furosemide (LASIX) 40 MG tablet Take 1 tablet (40 mg total) by mouth 2 (two) times daily. 60 tablet 0  . hydrocortisone (ANUSOL-HC) 25 MG suppository Place 1 suppository (25 mg total) rectally 2 (two) times daily. 12 suppository 0  . Methylcobalamin (B-12) 5000 MCG TBDP Take 5,000 Units by mouth daily.    Marland Kitchen omeprazole (PRILOSEC) 20 MG capsule Take 1 capsule (20 mg total) by mouth daily. 30 capsule 0  . ondansetron (ZOFRAN) 4 MG tablet Take 1 tablet (4 mg total) by mouth every 6 (six) hours as needed for nausea. 20 tablet 0  . OVER THE COUNTER MEDICATION Inhale 1 application into the lungs at bedtime. CPAP with O2    . oxymetazoline (AFRIN) 0.05 % nasal spray Place 1 spray into both nostrils 2 (two) times daily as needed for congestion. 30 mL 0  . potassium chloride SA (K-DUR,KLOR-CON) 20 MEQ tablet Take 2 tablets (40 mEq total) by mouth daily. 60 tablet 0  . predniSONE (DELTASONE) 20 MG tablet Take 1 tablet (20 mg total) by mouth daily with breakfast. 30 tablet 0  . propafenone (RYTHMOL) 300 MG tablet Take 1 tablet (300 mg total) by mouth 2 (two) times daily. 60 tablet 0  . rosuvastatin (CRESTOR) 10 MG tablet Take 1 tablet (10 mg total) by mouth at bedtime. 30 tablet 0  . sodium chloride (OCEAN) 0.65 % SOLN nasal spray Place 2 sprays into both nostrils as needed for congestion.  0  . tetrahydrozoline (REDNESS RELIEVER EYE DROPS) 0.05 % ophthalmic solution Place 1 drop into both eyes daily. 15 mL 0  . tiotropium (SPIRIVA) 18 MCG  inhalation capsule Place 1 capsule (18 mcg total) into inhaler and inhale daily. 30 capsule 0  . vitamin C (ASCORBIC ACID) 500 MG tablet Take 1 tablet (500 mg total) by mouth daily. 30 tablet 0   No current facility-administered medications for this visit.     Allergies:   Eliquis [apixaban] and Codeine   Social History:  The patient  reports that he quit smoking about 20 years ago. His smokeless tobacco use includes Chew. He reports that he does not drink alcohol or use drugs.   Family History:  The patient's family history includes Heart disease in his unknown relative; Liver cancer in his unknown relative.  ROS:  Please see the history of present illness.    All other systems are reviewed and otherwise negative.  PHYSICAL EXAM:  VS:  BP 118/76   Pulse 72   Ht 5\' 11"  (1.803 m)   Wt 263 lb (119.3 kg)   BMI 36.68 kg/m  BMI: Body mass index is 36.68 kg/m. Well nourished, well developed, in no acute distress  HEENT: normocephalic, atraumatic  Neck: no JVD, carotid bruits or masses Cardiac:   RRR; no significant murmurs, no rubs, or gallops Lungs:  CTA b/l, no wheezing, soft rhonchi b/l, no rales, on n/c O2 Abd: soft, nontender MS: no deformity + atrophy Ext: trace edema  Skin: warm and dry, no rash Neuro:  No gross deficits appreciated Psych: euthymic mood, full affect  PPM site is stable, no tethering or discomfort   EKG:   SR 72bpm, 1st degree AVBlock, PR 281ms, QRS 106, Qtc 411, appears unchanged, artifact on baseline, no clear pre-excitation  12/20/16: TTE Study Conclusions - Left ventricle: The cavity size was normal. Wall thickness was   normal. Systolic function was normal. The estimated ejection   fraction was in the range of 60% to 65%. Wall motion was normal;   there were no regional wall motion abnormalities. Doppler   parameters are consistent with abnormal left ventricular   relaxation (grade 1 diastolic dysfunction). - Right ventricle: The cavity size was  mildly dilated. Wall   thickness was normal.  03/23/16: EPS, Dr. Lovena Le Conclusion: Unsuccessful catheter ablation of AV reentrant tachycardia utilizing a left posterolateral accessory pathway. The tachycardia pathway could be successfully ablated, but unfortunately the temperature during radiofrequency energy application was never over 50 and accessory pathway conduction would recur within several minutes of delivering radiofrequency energy. Attempts to ablate on the ventricular side of the accessory pathway were unsuccessful. The patient's atrial flutter was not targeted for catheter ablation.   03/16/16: LHC  Prox LAD lesion, 10 %stenosed.  No significant coronary obstructive disease with a smooth mild proximal to mid 10% narrowing in the LAD after the takeoff of the first diagonal vessel with an area of mild mid systolic bridging in the mid LAD; normal left circumflex and right coronary arteries. LVEDP 13 mm Hg; in this patient with echo EF of 60-65% without wall motion abnormalities.    Recent Labs: 03/11/2016: B Natriuretic Peptide 668.9 12/17/2016: ALT 29; TSH 0.480 12/29/2016: Hemoglobin 13.6; Platelets 182 01/01/2017: BUN 33; Creatinine, Ser 1.38; Magnesium 2.2; Potassium 4.1; Sodium 135  No results found for requested labs within last 8760 hours.   Estimated Creatinine Clearance: 58.9 mL/min (A) (by C-G formula based on SCr of 1.38 mg/dL (H)).   Wt Readings from Last 3 Encounters:  01/14/17 263 lb (119.3 kg)  01/06/17 264 lb (119.7 kg)  01/01/17 262 lb (118.8 kg)     Other studies reviewed: Additional studies/records reviewed today include: summarized above  ASSESSMENT AND PLAN:  1. WPW, SVT     Palpitations in the last week     In review of record, hx of Aflutter (?during and EPS) taken off his Eliquis with a LGI/heorrhoidal bleed  Will increase his coreg to 25mg  BID, home and recent BP look like it should tolerate Wear an event monitor to evaluate the palpitations  further There is mention of an episode of VT in the hospital, though likely was his SVT, in review of his EPS I don't see inducible VT  2. HTN     Looks ok, higher numbers at home and prior visit  3.  Chronic CHF (diastolic)     his weight post hospitalization is down 23pounds  He has instructions to use extra lasix dose at PM when weigh increases by 4 pounds   Disposition: F/u in 2 months, sooner if needed  Current medicines are reviewed at length with the patient today.  The patient did not have any concerns regarding medicines.  Venetia Night, PA-C 01/14/2017 3:16 PM     [place] [address] [phone] (office)  [phone] (fax)

## 2017-01-18 DIAGNOSIS — Z1339 Encounter for screening examination for other mental health and behavioral disorders: Secondary | ICD-10-CM | POA: Diagnosis not present

## 2017-01-18 DIAGNOSIS — Z6837 Body mass index (BMI) 37.0-37.9, adult: Secondary | ICD-10-CM | POA: Diagnosis not present

## 2017-01-18 DIAGNOSIS — E663 Overweight: Secondary | ICD-10-CM | POA: Diagnosis not present

## 2017-01-18 DIAGNOSIS — J8410 Pulmonary fibrosis, unspecified: Secondary | ICD-10-CM | POA: Diagnosis not present

## 2017-01-18 DIAGNOSIS — Z23 Encounter for immunization: Secondary | ICD-10-CM | POA: Diagnosis not present

## 2017-01-19 ENCOUNTER — Telehealth: Payer: Self-pay | Admitting: Physician Assistant

## 2017-01-19 ENCOUNTER — Other Ambulatory Visit: Payer: Self-pay

## 2017-01-19 DIAGNOSIS — E785 Hyperlipidemia, unspecified: Secondary | ICD-10-CM | POA: Diagnosis not present

## 2017-01-19 DIAGNOSIS — Z9981 Dependence on supplemental oxygen: Secondary | ICD-10-CM | POA: Diagnosis not present

## 2017-01-19 DIAGNOSIS — I259 Chronic ischemic heart disease, unspecified: Secondary | ICD-10-CM | POA: Diagnosis not present

## 2017-01-19 DIAGNOSIS — I10 Essential (primary) hypertension: Secondary | ICD-10-CM | POA: Diagnosis not present

## 2017-01-19 DIAGNOSIS — J441 Chronic obstructive pulmonary disease with (acute) exacerbation: Secondary | ICD-10-CM | POA: Diagnosis not present

## 2017-01-19 DIAGNOSIS — Z9181 History of falling: Secondary | ICD-10-CM | POA: Diagnosis not present

## 2017-01-19 NOTE — Patient Outreach (Signed)
Transition of care:  Unable to reach patient last week after 2 attempts.  [name] today and again unable to reach.  PLAN: left message requesting a call back.  Will send outreach letter.  Tomasa Rand, RN, BSN, CEN [facility] [employer] [phone]

## 2017-01-19 NOTE — Telephone Encounter (Signed)
New message    Patient c/o Palpitations:  High priority if patient c/o lightheadedness, shortness of breath, or chest pain  1) How long have you had palpitations/irregular HR/ Afib? Are you having the symptoms now? Started yesterday  2) Are you currently experiencing lightheadedness, SOB or CP? SOB with episode  3) Do you have a history of afib (atrial fibrillation) or irregular heart rhythm? WPW  4) Have you checked your BP or HR? (document readings if available): HR-72 now 126 earlier today  5) Are you experiencing any other symptoms? No

## 2017-01-20 DIAGNOSIS — I259 Chronic ischemic heart disease, unspecified: Secondary | ICD-10-CM | POA: Diagnosis not present

## 2017-01-20 DIAGNOSIS — I10 Essential (primary) hypertension: Secondary | ICD-10-CM | POA: Diagnosis not present

## 2017-01-20 DIAGNOSIS — Z9981 Dependence on supplemental oxygen: Secondary | ICD-10-CM | POA: Diagnosis not present

## 2017-01-20 DIAGNOSIS — Z9181 History of falling: Secondary | ICD-10-CM | POA: Diagnosis not present

## 2017-01-20 DIAGNOSIS — E785 Hyperlipidemia, unspecified: Secondary | ICD-10-CM | POA: Diagnosis not present

## 2017-01-20 DIAGNOSIS — J441 Chronic obstructive pulmonary disease with (acute) exacerbation: Secondary | ICD-10-CM | POA: Diagnosis not present

## 2017-01-21 DIAGNOSIS — I10 Essential (primary) hypertension: Secondary | ICD-10-CM | POA: Diagnosis not present

## 2017-01-21 DIAGNOSIS — J441 Chronic obstructive pulmonary disease with (acute) exacerbation: Secondary | ICD-10-CM | POA: Diagnosis not present

## 2017-01-21 DIAGNOSIS — Z9981 Dependence on supplemental oxygen: Secondary | ICD-10-CM | POA: Diagnosis not present

## 2017-01-21 DIAGNOSIS — I259 Chronic ischemic heart disease, unspecified: Secondary | ICD-10-CM | POA: Diagnosis not present

## 2017-01-21 DIAGNOSIS — Z9181 History of falling: Secondary | ICD-10-CM | POA: Diagnosis not present

## 2017-01-21 DIAGNOSIS — E785 Hyperlipidemia, unspecified: Secondary | ICD-10-CM | POA: Diagnosis not present

## 2017-01-22 DIAGNOSIS — E785 Hyperlipidemia, unspecified: Secondary | ICD-10-CM | POA: Diagnosis not present

## 2017-01-22 DIAGNOSIS — Z9181 History of falling: Secondary | ICD-10-CM | POA: Diagnosis not present

## 2017-01-22 DIAGNOSIS — I10 Essential (primary) hypertension: Secondary | ICD-10-CM | POA: Diagnosis not present

## 2017-01-22 DIAGNOSIS — I259 Chronic ischemic heart disease, unspecified: Secondary | ICD-10-CM | POA: Diagnosis not present

## 2017-01-22 DIAGNOSIS — Z9981 Dependence on supplemental oxygen: Secondary | ICD-10-CM | POA: Diagnosis not present

## 2017-01-22 DIAGNOSIS — J441 Chronic obstructive pulmonary disease with (acute) exacerbation: Secondary | ICD-10-CM | POA: Diagnosis not present

## 2017-01-27 ENCOUNTER — Other Ambulatory Visit: Payer: Self-pay

## 2017-01-27 NOTE — Patient Outreach (Signed)
Transition of care:  [name] who answered.  Identified himself.  [name]. He reports complications with power and phone with many days without service.   Patient reports that he just got home from the store. Reports drove himself and is doing well. Reports difficulty with portable oxygen tanks and battery not lasting long.  Reports that he spoke with Corlis Hove and he is waiting for a call back. [name].  Reports that he has had 3 episodes of racing heart and states that MD has ordered an event monitor that he will have placed tomorrow.   Reports improved energy.  Reports home health nurse and PT have completed their treatments.    [name].  Encouraged patient to call me if he needs assistance. Sent email to Freda Jackson to have a scale sent to patient. Will plan to follow up with patient next week.  Tomasa Rand, RN, BSN, CEN [facility] [employer] [phone]

## 2017-01-28 ENCOUNTER — Other Ambulatory Visit: Payer: Self-pay | Admitting: Physician Assistant

## 2017-01-28 ENCOUNTER — Other Ambulatory Visit: Payer: Self-pay

## 2017-01-28 ENCOUNTER — Ambulatory Visit (INDEPENDENT_AMBULATORY_CARE_PROVIDER_SITE_OTHER): Payer: Medicare HMO

## 2017-01-28 DIAGNOSIS — R002 Palpitations: Secondary | ICD-10-CM

## 2017-01-28 DIAGNOSIS — I456 Pre-excitation syndrome: Secondary | ICD-10-CM

## 2017-01-28 NOTE — Patient Outreach (Signed)
Care coordination: [name] to inform him that a scale was ordered for him on 01/27/2017.  Informed patient it was mail and I would follow up with him next week.  Tomasa Rand, RN, BSN, CEN [facility] [employer] [phone]

## 2017-02-03 ENCOUNTER — Other Ambulatory Visit: Payer: Self-pay

## 2017-02-03 NOTE — Patient Outreach (Signed)
Transition of care: [name] who reports that he is doing well. Reports breathing is better. States that he has gotten his scale and is weighing daily.  Reports he is also wearing his 30 day monitor. Reports that he had follow up planned with pulmonary today but had difficulty with oxygen tank.  Reports he is planning to call [employer] for assistance with his tanks. Offered to assistance and patient states he can do it.  Reviewed his completion of the 30 day transition of care program.. Patient declines any needs today.  PLAN: will contact patient in 1 month for folllow up. Encouraged patient to call sooner if needed.  Tomasa Rand, RN, BSN, CEN [facility] [employer] [phone]

## 2017-02-06 DIAGNOSIS — G4733 Obstructive sleep apnea (adult) (pediatric): Secondary | ICD-10-CM | POA: Diagnosis not present

## 2017-02-16 ENCOUNTER — Telehealth: Payer: Self-pay | Admitting: Internal Medicine

## 2017-02-16 NOTE — Telephone Encounter (Signed)
Strips received and reviewed by dr Curt Bears, nothing to do at this time. Strips placed in dr taylor's box for review.

## 2017-02-16 NOTE — Telephone Encounter (Signed)
Spoke with pt, this morning he noticed SOB and a fluttering in his chest. He reports it did not last long and it felt like his normal palpitations. He feels fine at this time and reports it only lasted a short amount of time. Per the life watch the patient had atrial fib with 110 bpm. Will wait for the strips to be faxed to the office to review.

## 2017-02-16 NOTE — Telephone Encounter (Signed)
New message    [employer] is calling with a report about montiro.

## 2017-02-18 DIAGNOSIS — C642 Malignant neoplasm of left kidney, except renal pelvis: Secondary | ICD-10-CM | POA: Diagnosis not present

## 2017-02-18 DIAGNOSIS — K760 Fatty (change of) liver, not elsewhere classified: Secondary | ICD-10-CM | POA: Diagnosis not present

## 2017-02-25 NOTE — Telephone Encounter (Signed)
Dr. Lovena Le reviewed strips.  Per review Pt with "noise and a fib".  No orders received.  Pt previously intolerant of Eliquis.  Will have Pt f/u with Dr. Lovena Le.

## 2017-02-28 DIAGNOSIS — R0902 Hypoxemia: Secondary | ICD-10-CM | POA: Diagnosis not present

## 2017-02-28 DIAGNOSIS — I4891 Unspecified atrial fibrillation: Secondary | ICD-10-CM | POA: Diagnosis not present

## 2017-03-01 ENCOUNTER — Emergency Department (HOSPITAL_COMMUNITY)
Admission: EM | Admit: 2017-03-01 | Discharge: 2017-03-01 | Disposition: A | Discharge status: Home / Self Care | Payer: Medicare HMO | Attending: Emergency Medicine | Admitting: Emergency Medicine

## 2017-03-01 ENCOUNTER — Emergency Department (HOSPITAL_COMMUNITY): Payer: Medicare HMO

## 2017-03-01 ENCOUNTER — Encounter (HOSPITAL_COMMUNITY): Payer: Self-pay | Admitting: Emergency Medicine

## 2017-03-01 DIAGNOSIS — Z79899 Other long term (current) drug therapy: Secondary | ICD-10-CM | POA: Insufficient documentation

## 2017-03-01 DIAGNOSIS — F17220 Nicotine dependence, chewing tobacco, uncomplicated: Secondary | ICD-10-CM | POA: Diagnosis not present

## 2017-03-01 DIAGNOSIS — J449 Chronic obstructive pulmonary disease, unspecified: Secondary | ICD-10-CM | POA: Diagnosis not present

## 2017-03-01 DIAGNOSIS — R600 Localized edema: Secondary | ICD-10-CM | POA: Insufficient documentation

## 2017-03-01 DIAGNOSIS — R002 Palpitations: Secondary | ICD-10-CM | POA: Insufficient documentation

## 2017-03-01 DIAGNOSIS — Z9981 Dependence on supplemental oxygen: Secondary | ICD-10-CM | POA: Diagnosis not present

## 2017-03-01 DIAGNOSIS — I129 Hypertensive chronic kidney disease with stage 1 through stage 4 chronic kidney disease, or unspecified chronic kidney disease: Secondary | ICD-10-CM | POA: Insufficient documentation

## 2017-03-01 DIAGNOSIS — N183 Chronic kidney disease, stage 3 (moderate): Secondary | ICD-10-CM | POA: Insufficient documentation

## 2017-03-01 DIAGNOSIS — I5032 Chronic diastolic (congestive) heart failure: Secondary | ICD-10-CM | POA: Insufficient documentation

## 2017-03-01 DIAGNOSIS — I110 Hypertensive heart disease with heart failure: Secondary | ICD-10-CM | POA: Diagnosis not present

## 2017-03-01 DIAGNOSIS — R0602 Shortness of breath: Secondary | ICD-10-CM | POA: Diagnosis not present

## 2017-03-01 DIAGNOSIS — I480 Paroxysmal atrial fibrillation: Secondary | ICD-10-CM

## 2017-03-01 DIAGNOSIS — Z7982 Long term (current) use of aspirin: Secondary | ICD-10-CM | POA: Diagnosis not present

## 2017-03-01 LAB — BASIC METABOLIC PANEL
Anion gap: 8 (ref 5–15)
BUN: 20 mg/dL (ref 6–20)
CHLORIDE: 105 mmol/L (ref 101–111)
CO2: 27 mmol/L (ref 22–32)
CREATININE: 1 mg/dL (ref 0.61–1.24)
Calcium: 8.4 mg/dL — ABNORMAL LOW (ref 8.9–10.3)
Glucose, Bld: 119 mg/dL — ABNORMAL HIGH (ref 65–99)
POTASSIUM: 4 mmol/L (ref 3.5–5.1)
Sodium: 140 mmol/L (ref 135–145)

## 2017-03-01 LAB — CBC WITH DIFFERENTIAL/PLATELET
BASOS ABS: 0 10*3/uL (ref 0.0–0.1)
Basophils Relative: 0 %
EOS ABS: 0.3 10*3/uL (ref 0.0–0.7)
EOS PCT: 4 %
HCT: 42.6 % (ref 39.0–52.0)
HEMOGLOBIN: 13.3 g/dL (ref 13.0–17.0)
LYMPHS ABS: 2.2 10*3/uL (ref 0.7–4.0)
Lymphocytes Relative: 29 %
MCH: 29.2 pg (ref 26.0–34.0)
MCHC: 31.2 g/dL (ref 30.0–36.0)
MCV: 93.4 fL (ref 78.0–100.0)
Monocytes Absolute: 0.7 10*3/uL (ref 0.1–1.0)
Monocytes Relative: 9 %
NEUTROS PCT: 58 %
Neutro Abs: 4.2 10*3/uL (ref 1.7–7.7)
PLATELETS: 154 10*3/uL (ref 150–400)
RBC: 4.56 MIL/uL (ref 4.22–5.81)
RDW: 14.7 % (ref 11.5–15.5)
WBC: 7.4 10*3/uL (ref 4.0–10.5)

## 2017-03-01 LAB — I-STAT TROPONIN, ED: TROPONIN I, POC: 0 ng/mL (ref 0.00–0.08)

## 2017-03-01 LAB — MAGNESIUM: MAGNESIUM: 2.2 mg/dL (ref 1.7–2.4)

## 2017-03-01 MED ORDER — DILTIAZEM HCL ER COATED BEADS 180 MG PO CP24
180.0000 mg | ORAL_CAPSULE | Freq: Once | ORAL | Status: AC
  Administered 2017-03-01: 180 mg via ORAL
  Filled 2017-03-01: qty 1

## 2017-03-01 MED ORDER — DILTIAZEM HCL ER COATED BEADS 180 MG PO CP24: 180.0000 mg | ORAL_CAPSULE | Freq: Every day | ORAL | 0 refills | Status: DC

## 2017-03-01 NOTE — ED Provider Notes (Signed)
[facility] EMERGENCY DEPARTMENT Provider Note   CSN: [id number] Arrival date & time: 03/01/17  0018     History   Chief Complaint Chief Complaint  Patient presents with  . Palpitations    HPI Chad Hogan is a 76 y.o. male.  The history is provided by the patient.  Palpitations   This is a new problem. The current episode started 3 to 5 hours ago. The problem occurs hourly. The problem has been gradually improving. Associated symptoms include irregular heartbeat, lower extremity edema and shortness of breath. Pertinent negatives include no fever, no chest pain, no syncope and no abdominal pain.   Patient with h/o WPW s/p unsuccessful ablation, obesity, COPD on home oxygen presents with palpitations.  He reports he felt his heart racing earlier tonight and called EMS It is now improved No syncope but felt lightheaded There is some SOB reported Past Medical History:  Diagnosis Date  . Anxiety   . CAP (community acquired pneumonia) 12/2016  . CKD (chronic kidney disease) stage 3, GFR 30-59 ml/min (HCC)   . COPD (chronic obstructive pulmonary disease) ([place])   . Diastolic CHF, chronic ([place])   . Dyspnea   . HLD (hyperlipidemia)   . HTN (hypertension)   . Ischemic heart disease   . Obesity   . OSA (obstructive sleep apnea)   . Palpitation   . Pulmonary embolism ([name])   . PVC (premature ventricular contraction)   . SOB (shortness of breath)   . SVT (supraventricular tachycardia) ([place])   . Umbilical hernia   . WPW (Wolff-Parkinson-White syndrome)     Patient Active Problem List   Diagnosis Date Noted  . COPD with acute exacerbation ([place])   . Sepsis due to pneumonia ([place])   . Chronic diastolic CHF (congestive heart failure) ([place])   . Wolff-Parkinson-White syndrome   . Thoracic ascending aortic aneurysm ([place])   . Obesity hypoventilation syndrome ([place])   . OSA on CPAP   . CKD (chronic kidney disease), stage III ([place]) 12/16/2016  . Acute on chronic  respiratory failure with hypoxia ([place]) 12/16/2016  . NSVT (nonsustained ventricular tachycardia) ([place]) 12/16/2016  . Hypocalcemia 12/16/2016  . SVT (supraventricular tachycardia) ([place]) 03/23/2016  . WPW syndrome 03/23/2016  . Bradycardia 03/11/2016  . Ventricular tachycardia ([place]) 03/11/2016  . Benign neoplasm of colon 06/11/2014  . Diverticulosis of colon without hemorrhage 06/11/2014  . Internal and external hemorrhoids without complication 82/95/6213  . Duodenal mass 06/11/2014  . History of iron deficiency anemia 05/02/2014  . Acute pulmonary embolism ([place]) 05/02/2014  . PALPITATIONS 02/11/2010  . ATYPICAL DEPRESSIVE DISORDER 08/21/2008  . PULMONARY FIBROSIS 09/15/2007  . Chronic diastolic heart failure ([place]) 09/15/2007  . HYPERLIPIDEMIA 08/16/2007  . Sleep apnea 08/16/2007  . Morbid obesity ([place]) 08/15/2007  . ANXIETY 08/15/2007  . Essential hypertension 08/15/2007  . ISCHEMIC HEART DISEASE 08/15/2007  . WOLFF (WOLFE)-PARKINSON-WHITE (WPW) SYNDROME 08/15/2007  . PREMATURE VENTRICULAR CONTRACTIONS 08/15/2007  . Chronic airway obstruction, not elsewhere classified 08/15/2007    Past Surgical History:  Procedure Laterality Date  . COLONOSCOPY WITH PROPOFOL N/A 06/11/2014   Performed by Inda Castle, MD at [facility]  . Electrophysiology Study N/A 03/23/2016   Performed by Evans Lance, MD at [facility]  . ESOPHAGOGASTRODUODENOSCOPY (EGD) WITH PROPOFOL N/A 06/11/2014   Performed by Inda Castle, MD at [facility]  . HERNIA REPAIR    . Left Heart Cath and Coronary Angiography N/A 03/16/2016   Performed by Claiborne Billings,  Joyice Faster, MD at [facility]  . SVT Ablation N/A 03/23/2016   Performed by Evans Lance, MD at [facility]       Home Medications    Prior to Admission medications   Medication Sig Start Date End Date Taking? Authorizing Provider  acetaminophen (TYLENOL) 325 MG tablet Take 2 tablets (650 mg total) by mouth every 6 (six)  hours as needed for mild pain (or Fever >/= 101). 12/30/16   Cherene Altes, MD  albuterol (PROVENTIL) (2.5 MG/3ML) 0.083% nebulizer solution Take 3 mLs (2.5 mg total) by nebulization every 4 (four) hours as needed for wheezing or shortness of breath. 01/01/17   Allie Bossier, MD  ALPRAZolam Duanne Moron) 1 MG tablet Take 1 tablet (1 mg total) by mouth 2 (two) times daily as needed for anxiety. 12/30/16   Cherene Altes, MD  aspirin EC 81 MG tablet Take 1 tablet (81 mg total) by mouth daily. Do not fill 12/08/16   Evans Lance, MD  carvedilol (COREG) 25 MG tablet Take 1 tablet (25 mg total) by mouth 2 (two) times daily with a meal. 01/14/17   Baldwin Jamaica, PA-C  ferrous sulfate 325 (65 FE) MG tablet Take 1 tablet (325 mg total) by mouth daily. 01/01/17   Allie Bossier, MD  fluticasone (FLONASE) 50 MCG/ACT nasal spray Place 2 sprays into both nostrils daily. 01/01/17   Allie Bossier, MD  Fluticasone-Salmeterol (ADVAIR DISKUS) 250-50 MCG/DOSE AEPB Inhale 1 puff into the lungs every 12 (twelve) hours. 01/01/17   Allie Bossier, MD  furosemide (LASIX) 40 MG tablet Take 1 tablet (40 mg total) by mouth 2 (two) times daily. 01/01/17   Allie Bossier, MD  hydrocortisone (ANUSOL-HC) 25 MG suppository Place 1 suppository (25 mg total) rectally 2 (two) times daily. 01/01/17   Allie Bossier, MD  Methylcobalamin (B-12) 5000 MCG TBDP Take 5,000 Units by mouth daily.    [provider]  omeprazole (PRILOSEC) 20 MG capsule Take 1 capsule (20 mg total) by mouth daily. 01/01/17   Allie Bossier, MD  ondansetron (ZOFRAN) 4 MG tablet Take 1 tablet (4 mg total) by mouth every 6 (six) hours as needed for nausea. 01/01/17   Allie Bossier, MD  OVER THE COUNTER MEDICATION Inhale 1 application into the lungs at bedtime. CPAP with O2    [provider]  oxymetazoline (AFRIN) 0.05 % nasal spray Place 1 spray into both nostrils 2 (two) times daily as needed for congestion. 01/01/17   Allie Bossier, MD   potassium chloride SA (K-DUR,KLOR-CON) 20 MEQ tablet Take 2 tablets (40 mEq total) by mouth daily. 01/01/17   Allie Bossier, MD  predniSONE (DELTASONE) 20 MG tablet Take 1 tablet (20 mg total) by mouth daily with breakfast. 01/01/17   Allie Bossier, MD  propafenone (RYTHMOL) 300 MG tablet Take 1 tablet (300 mg total) by mouth 2 (two) times daily. 01/01/17   Allie Bossier, MD  rosuvastatin (CRESTOR) 10 MG tablet Take 1 tablet (10 mg total) by mouth at bedtime. 01/01/17   Allie Bossier, MD  sodium chloride (OCEAN) 0.65 % SOLN nasal spray Place 2 sprays into both nostrils as needed for congestion. 12/30/16   Cherene Altes, MD  tetrahydrozoline (REDNESS RELIEVER EYE DROPS) 0.05 % ophthalmic solution Place 1 drop into both eyes daily. 01/01/17   Allie Bossier, MD  tiotropium (SPIRIVA) 18 MCG inhalation capsule Place 1 capsule (18  mcg total) into inhaler and inhale daily. 01/01/17   Allie Bossier, MD  vitamin C (ASCORBIC ACID) 500 MG tablet Take 1 tablet (500 mg total) by mouth daily. 01/01/17   Allie Bossier, MD    Family History Family History  Problem Relation Age of Onset  . Liver cancer Unknown   . Heart disease Unknown     Social History Social History   Tobacco Use  . Smoking status: Former Smoker    Last attempt to quit: 04/13/1996    Years since quitting: 20.8  . Smokeless tobacco: Current User    Types: Chew  . Tobacco comment: chews once a day  Substance Use Topics  . Alcohol use: No  . Drug use: No     Allergies   Eliquis [apixaban] and Codeine   Review of Systems Review of Systems  Constitutional: Negative for fever.  Respiratory: Positive for shortness of breath.   Cardiovascular: Positive for palpitations and leg swelling. Negative for chest pain and syncope.  Gastrointestinal: Negative for abdominal pain.  All other systems reviewed and are negative.    Physical Exam Updated Vital Signs BP (!) 150/82 (BP Location: Right Arm)   Pulse 76   Temp  98.1 F (36.7 C) (Oral)   Resp (!) 21   SpO2 95%   Physical Exam CONSTITUTIONAL: Chronically ill appearing, no distress HEAD: Normocephalic/atraumatic EYES: EOMI/PERRL ENMT: Mucous membranes moist NECK: supple no meningeal signs SPINE/BACK:entire spine nontender CV: S1/S2 noted, no murmurs/rubs/gallops noted LUNGS: bibasilar crackles, no apparent distress ABDOMEN: soft, nontender,obese GU:no cva tenderness NEURO: Pt is awake/alert/appropriate, moves all extremitiesx4.  No facial droop.   EXTREMITIES: pulses normal/equal, full ROM,bilateral LE pitting edema SKIN: warm, color normal PSYCH: no abnormalities of mood noted, alert and oriented to situation   ED Treatments / Results  Labs (all labs ordered are listed, but only abnormal results are displayed) Labs Reviewed  BASIC METABOLIC PANEL - Abnormal; Notable for the following components:      Result Value   Glucose, Bld 119 (*)    Calcium 8.4 (*)    All other components within normal limits  CBC WITH DIFFERENTIAL/PLATELET  MAGNESIUM  I-STAT TROPONIN, ED    EKG  EKG Interpretation  Date/Time:  Monday March 01 2017 00:28:27 EST Ventricular Rate:  79 PR Interval:    QRS Duration: 129 QT Interval:  393 QTC Calculation: 451 R Axis:   -49 Text Interpretation:  Sinus rhythm Nonspecific IVCD with LAD Consider anterior infarct No significant change since last tracing Confirmed by Ripley Fraise [phone]) on 03/01/2017 1:29:50 AM       Radiology Dg Chest 2 View  Result Date: 03/01/2017 CLINICAL DATA:  76 y/o M; palpitations and shortness of breath. History of recent pneumonia. EXAM: CHEST  2 VIEW COMPARISON:  12/27/2016 chest radiograph FINDINGS: Decreased density of right lateral lung base opacity interval resolution of left lung base opacity when compared with prior chest radiograph. No new focal consolidation. No pleural effusion or pneumothorax. Stable cardiac silhouette. Bones are unremarkable. IMPRESSION: Resolved  left lung base opacity. Decreased density of right lung base opacity compatible with residual/ resolving pneumonia. No new consolidation. Electronically Signed   By: Kristine Garbe M.D.   On: 03/01/2017 01:35    Procedures Procedures (including critical care time)  Medications Ordered in ED Medications - No data to display   Initial Impression / Assessment and Plan / ED Course  I have reviewed the triage vital signs and the nursing notes.  Pertinent  labs & imaging results that were available during my care of the patient were reviewed by me and considered in my medical decision making (see chart for details).     1:08 AM Pt with complex medical history including WPW reporting palpitations When I am in room, monitor reads sinus rhythm Tele monitor earlier revealed tachycardic rhythm though very brief unclear if it was afib prehospital EKG reviewed, appears sinus but poor quality Imaging/labs ordered 4:26 AM Pt resting comfortably Review of tele monitor, it appears he has brief episodes of afib Now in sinus D/w on call cardiology fellow "Shelton Silvas" We discussed case including h/o WPW s/p failed ablation, on propafenone and coreg.  We discussed tele/ekg findings She does NOT recommend any med changes Previous records indicate he was not placed back on anticoagulation due to GI bleed Recent home monitoring revealed episodes of afib per EPIC He will need close cardiology followup Final Clinical Impressions(s) / ED Diagnoses   Final diagnoses:  Palpitations    ED Discharge Orders    None       Ripley Fraise, MD 03/01/17 [phone]

## 2017-03-01 NOTE — Discharge Instructions (Addendum)
Please see the Afib doctors - call for an appointment.

## 2017-03-01 NOTE — ED Provider Notes (Signed)
I discussed plan with patient He refuses to leave, states something must be done Extensive discussion about findings and cardiology recommendations I offered him to wait in the ED and see cardiology on morning rounds    Ripley Fraise, MD 03/01/17 [phone]

## 2017-03-01 NOTE — ED Triage Notes (Signed)
Per EMS pt from home.  Cardiac history. Followed by Dr. Lovena Le.  Has a history of WPW per pt.  Pt states he was cardioverted last December and hasn't had any issues since then. However he also states he has been wearing a Holter monitor the past 30 days that was removed Friday.  COPD hx. Wears 4L at home. Pt states he has been monitoring his heart rate at home and feels like he is tachycardic.  Pt HR currently in the 80s and states "it's not doing like it was at home"  VSS.  No pain. No SHOB

## 2017-03-01 NOTE — ED Notes (Signed)
Pt states "im not leaving without an oxygen tank and I dont want to take PTAR."  [facility] because the patient stated he used them for oxygen supply. The company states he is not a patient with them.

## 2017-03-01 NOTE — Consult Note (Signed)
Cardiology Consultation:   Patient ID: Chad Hogan; [id number]; 08/18/1940   Admit date: 03/01/2017 Date of Consult: 03/01/2017  Primary Care Provider: Myer Peer, MD  Primary Electrophysiologist:  Dr. Lovena Hogan   Patient Profile:   Chad Hogan is a 76 y.o. male with a hx of HTN, HLD, COPD chronic home O2, CRI (III), WPW SVT w/unsuccessful ablation, he was noted to have Aflutter though his Eliquis stopped 2/2 hemorrhoidal bleding, chronic CHF (diastolic). who is being seen today for the evaluation of palpitations at the request of Dr. Kathrynn Hogan.  History of Present Illness:   Chad Hogan reports doing fairly well, not much in the way of palpitation since his coreg was increased until yesterday evening, he developed fairly frequent feeling of skipped/fluttering beats, these seems to make him feel more SOB, and are anxiety provoking for him.  They last only a few seconds, no prolonged episodes.   No CP, no near syncope or syncope.  He assures he is using his O2 at home.  He was seen in the office by myself about 6 weeks ago, at that time he was having palpitations his coreg was increased and an EM was ordered, it showed brief AFib, CVR, these were reviewed with Dr. Lovena Hogan no changes were made to his therapy and given failed Eliquis, no a/c in review of phone notes.   LABS: K+ 4.0 BUN/Creat 20/1.00 poc Trop 0.00 WBC 7.4 H/H 13/42 Plts 154  AAD: Intolerant of flecainide Currently Rhythmol A/C Off Eliquis 2/2 hemorrhoidal bleeding  Past Medical History:  Diagnosis Date  . Anxiety   . CAP (community acquired pneumonia) 12/2016  . CKD (chronic kidney disease) stage 3, GFR 30-59 ml/min (HCC)   . COPD (chronic obstructive pulmonary disease) ([place])   . Diastolic CHF, chronic ([place])   . Dyspnea   . HLD (hyperlipidemia)   . HTN (hypertension)   . Ischemic heart disease   . Obesity   . OSA (obstructive sleep apnea)   . Palpitation   . Pulmonary embolism ([place])   . PVC (premature  ventricular contraction)   . SOB (shortness of breath)   . SVT (supraventricular tachycardia) ([place])   . Umbilical hernia   . WPW (Wolff-Parkinson-White syndrome)     Past Surgical History:  Procedure Laterality Date  . COLONOSCOPY WITH PROPOFOL N/A 06/11/2014   Performed by Inda Castle, MD at [facility]  . Electrophysiology Study N/A 03/23/2016   Performed by Evans Lance, MD at [facility]  . ESOPHAGOGASTRODUODENOSCOPY (EGD) WITH PROPOFOL N/A 06/11/2014   Performed by Inda Castle, MD at [facility]  . HERNIA REPAIR    . Left Heart Cath and Coronary Angiography N/A 03/16/2016   Performed by Troy Sine, MD at [facility]  . SVT Ablation N/A 03/23/2016   Performed by Evans Lance, MD at [facility]       Inpatient Medications: Scheduled Meds: . diltiazem  180 mg Oral Once   Continuous Infusions:  PRN Meds:   Allergies:    Allergies  Allergen Reactions  . Eliquis [Apixaban] Other (See Comments)    Bleeding event  . Codeine Other (See Comments)    Cant remember    Social History:   Social History   Socioeconomic History  . Marital status: Divorced    Spouse name: Not on file  . Number of children: 2  . Years of education: Not on file  . Highest education level: Not  on file  Social Needs  . Financial resource strain: Not on file  . Food insecurity - worry: Not on file  . Food insecurity - inability: Not on file  . Transportation needs - medical: Not on file  . Transportation needs - non-medical: Not on file  Occupational History  . Occupation: retired     Fish farm manager: [employer]  Tobacco Use  . Smoking status: Former Smoker    Last attempt to quit: 04/13/1996    Years since quitting: 20.8  . Smokeless tobacco: Current User    Types: Chew  . Tobacco comment: chews once a day  Substance and Sexual Activity  . Alcohol use: No  . Drug use: No  . Sexual activity: Not Currently  Other Topics Concern  . Not on file    Social History Narrative  . Not on file    Family History:    Family History  Problem Relation Age of Onset  . Liver cancer Unknown   . Heart disease Unknown      ROS:  Please see the history of present illness.  ROS  All other ROS reviewed and negative.     Physical Exam/Data:   Vitals:   03/01/17 0445 03/01/17 0500 03/01/17 0700 03/01/17 0730  BP: 114/61 (!) 105/56 (!) 146/76 (!) 146/74  Pulse: 70 70 71 67  Resp: (!) 24 (!) 23 (!) 24 (!) 23  Temp:      TempSrc:      SpO2: 96% 96% 96% 96%   No intake or output data in the 24 hours ending 03/01/17 0801 There were no vitals filed for this visit. There is no height or weight on file to calculate BMI.  General:  Well nourished, well developed, appears disheveled, in no acute distress HEENT: normal Lymph: no adenopathy Neck: no JVD Endocrine:  No thryomegaly Vascular: No carotid bruits  Cardiac:  RRR; soft SM, no gallops or rubs Lungs:  no wheezing, soft scattered rhonchi, no rales  Abd: soft, nontender Ext: 1+ edema b/l Musculoskeletal:  No deformities, age appropriate atrophy Skin: warm and dry  Neuro:  No gross focal abnormalities noted Psych:  Normal affect   EKG:  The EKG was personally reviewed and demonstrates:   SR, 79bpm, borderline 1st degree AVBlokc,, nonspecific IVCD, LAD EMS tracings reviewed, SR, appears frequent PACs, grouped Telemetry:  Telemetry was personally reviewed and demonstrates:   SR only (despite patient feeling ongoing intermittent palpitations)  Relevant CV Studies:  /9/18: TTE Study Conclusions - Left ventricle: The cavity size was normal. Wall thickness was normal. Systolic function was normal. The estimated ejection fraction was in the range of 60% to 65%. Wall motion was normal; there were no regional wall motion abnormalities. Doppler parameters are consistent with abnormal left ventricular relaxation (grade 1 diastolic dysfunction). - Right ventricle: The cavity  size was mildly dilated. Wall thickness was normal.  03/23/16: EPS, Dr. Lovena Hogan Conclusion: Unsuccessful catheter ablation of AV reentrant tachycardia utilizing a left posterolateral accessory pathway. The tachycardia pathway could be successfully ablated, but unfortunately the temperature during radiofrequency energy application was never over 50 and accessory pathway conduction would recur within several minutes of delivering radiofrequency energy. Attempts to ablate on the ventricular side of the accessory pathway were unsuccessful. The patient's atrial flutter was not targeted for catheter ablation.   03/16/16: LHC  Prox LAD lesion, 10 %stenosed. No significant coronary obstructive disease with a smooth mild proximal to mid 10% narrowing in the LAD after the takeoff of the first  diagonal vessel with an area of mild mid systolic bridging in the mid LAD; normal left circumflex and right coronary arteries. LVEDP 13 mm Hg; in this patient with echo EF of 60-65% without wall motion abnormalities.    Laboratory Data:  Chemistry Recent Labs  Lab 03/01/17 0133  NA 140  K 4.0  CL 105  CO2 27  GLUCOSE 119*  BUN 20  CREATININE 1.00  CALCIUM 8.4*  GFRNONAA >60  GFRAA >60  ANIONGAP 8    No results for input(s): PROT, ALBUMIN, AST, ALT, ALKPHOS, BILITOT in the last 168 hours. Hematology Recent Labs  Lab 03/01/17 0133  WBC 7.4  RBC 4.56  HGB 13.3  HCT 42.6  MCV 93.4  MCH 29.2  MCHC 31.2  RDW 14.7  PLT 154   Cardiac EnzymesNo results for input(s): TROPONINI in the last 168 hours.  Recent Labs  Lab 03/01/17 0146  TROPIPOC 0.00    BNPNo results for input(s): BNP, PROBNP in the last 168 hours.  DDimer No results for input(s): DDIMER in the last 168 hours.  Radiology/Studies:   Dg Chest 2 View Result Date: 03/01/2017 CLINICAL DATA:  76 y/o M; palpitations and shortness of breath. History of recent pneumonia. EXAM: CHEST  2 VIEW COMPARISON:  12/27/2016 chest  radiograph FINDINGS: Decreased density of right lateral lung base opacity interval resolution of left lung base opacity when compared with prior chest radiograph. No new focal consolidation. No pleural effusion or pneumothorax. Stable cardiac silhouette. Bones are unremarkable. IMPRESSION: Resolved left lung base opacity. Decreased density of right lung base opacity compatible with residual/ resolving pneumonia. No new consolidation. Electronically Signed   By: Kristine Garbe M.D.   On: 03/01/2017 01:35    Assessment and Plan:   1. Palpitations      PACs on EMS tracings, difficult to see well though clearly has sinus beats     Known hx of AFlutter/and brief AFib     CHA2DS2Vasc is 3, taken off a/c 2/2 bleeding  The patient is seen with Dr. Curt Bears, start Diltiazem 180mg  daily with current regime, Missie Gehrig arrange AFib clinic f/u, for early follow up  2. HTN     Looks OK  3. Chronic CHF (diastolic)     Cxr, symptoms, exam do not suggest overt/acute fluid OL   For questions or updates, please contact [facility] Please consult [email or web address] for contact info under Cardiology/STEMI.   Signed, Baldwin Jamaica, PA-C  03/01/2017 8:01 AM   I have seen and examined this patient with Tommye Standard.  Agree with above, note added to reflect my findings.  On exam, RRR, no murmurs, lungs clear. Presented to the hospital with palpitations. Currently on rhythmol and coreg. has had APCs in the ER which was symptomatic. Aveline Daus start diltiazem today and have him follow up in AF clinic.    Carinne Brandenburger M. Ilithyia Titzer MD 03/01/2017 2:04 PM

## 2017-03-01 NOTE — ED Notes (Signed)
Patient transported to X-ray 

## 2017-03-01 NOTE — ED Notes (Signed)
Pt waiting on his O2 tank delivery to leave with sister.

## 2017-03-01 NOTE — ED Notes (Signed)
Pharmacy called for the cardizem to be sent.

## 2017-03-01 NOTE — ED Provider Notes (Signed)
D/w dr Dorris Carnes Plan to see patient in the ED   Ripley Fraise, MD 03/01/17 [phone]

## 2017-03-02 ENCOUNTER — Telehealth: Payer: Self-pay | Admitting: Physician Assistant

## 2017-03-02 NOTE — Telephone Encounter (Signed)
MESSAGED URSUY

## 2017-03-02 NOTE — Telephone Encounter (Signed)
°  New Prob  Pt states he was evaluated in the Emergency Department yesterday and was told to call our office and speak with Mack Hook (per his discharge paperwork).

## 2017-03-08 ENCOUNTER — Ambulatory Visit: Payer: Self-pay

## 2017-03-08 ENCOUNTER — Other Ambulatory Visit: Payer: Self-pay

## 2017-03-08 NOTE — Patient Outreach (Signed)
A fib- telephone assessment:  Placed 60 day follow up call to patient.  Patient reports that he was seen in the emergency department for rapid heart beat and was placed and dilitazem.. Reports that he is taking his medications as prescribed. Reports he had 5 episodes of irregular heart beat on 11/24.  Reports it comes and goes. Reports that he has a follow up appointment at the a fib clinic in 3 days. Reports that he will drive himself and is aware of the code to get in the gate.  PLAN: will follow up in 1 week to make sure he is comfortable with plan of care.  Tomasa Rand, RN, BSN, CEN [facility] [employer] [phone]

## 2017-03-09 DIAGNOSIS — G4733 Obstructive sleep apnea (adult) (pediatric): Secondary | ICD-10-CM | POA: Diagnosis not present

## 2017-03-11 ENCOUNTER — Encounter (HOSPITAL_COMMUNITY): Payer: Self-pay | Admitting: Nurse Practitioner

## 2017-03-11 ENCOUNTER — Ambulatory Visit (HOSPITAL_COMMUNITY)
Admission: RE | Admit: 2017-03-11 | Discharge: 2017-03-11 | Disposition: A | Discharge status: Home / Self Care | Payer: Medicare HMO | Source: Ambulatory Visit | Attending: Nurse Practitioner | Admitting: Nurse Practitioner

## 2017-03-11 VITALS — BP 122/64 | HR 77 | Ht 71.0 in | Wt 265.6 lb

## 2017-03-11 DIAGNOSIS — J449 Chronic obstructive pulmonary disease, unspecified: Secondary | ICD-10-CM | POA: Diagnosis not present

## 2017-03-11 DIAGNOSIS — Z7951 Long term (current) use of inhaled steroids: Secondary | ICD-10-CM | POA: Diagnosis not present

## 2017-03-11 DIAGNOSIS — Z87891 Personal history of nicotine dependence: Secondary | ICD-10-CM | POA: Insufficient documentation

## 2017-03-11 DIAGNOSIS — Z9981 Dependence on supplemental oxygen: Secondary | ICD-10-CM | POA: Diagnosis not present

## 2017-03-11 DIAGNOSIS — E785 Hyperlipidemia, unspecified: Secondary | ICD-10-CM | POA: Insufficient documentation

## 2017-03-11 DIAGNOSIS — G4733 Obstructive sleep apnea (adult) (pediatric): Secondary | ICD-10-CM | POA: Diagnosis not present

## 2017-03-11 DIAGNOSIS — I130 Hypertensive heart and chronic kidney disease with heart failure and stage 1 through stage 4 chronic kidney disease, or unspecified chronic kidney disease: Secondary | ICD-10-CM | POA: Diagnosis not present

## 2017-03-11 DIAGNOSIS — R002 Palpitations: Secondary | ICD-10-CM

## 2017-03-11 DIAGNOSIS — Z86711 Personal history of pulmonary embolism: Secondary | ICD-10-CM | POA: Diagnosis not present

## 2017-03-11 DIAGNOSIS — Z6837 Body mass index (BMI) 37.0-37.9, adult: Secondary | ICD-10-CM | POA: Diagnosis not present

## 2017-03-11 DIAGNOSIS — I259 Chronic ischemic heart disease, unspecified: Secondary | ICD-10-CM | POA: Diagnosis not present

## 2017-03-11 DIAGNOSIS — E669 Obesity, unspecified: Secondary | ICD-10-CM | POA: Diagnosis not present

## 2017-03-11 DIAGNOSIS — Z885 Allergy status to narcotic agent status: Secondary | ICD-10-CM | POA: Diagnosis not present

## 2017-03-11 DIAGNOSIS — N183 Chronic kidney disease, stage 3 (moderate): Secondary | ICD-10-CM | POA: Diagnosis not present

## 2017-03-11 DIAGNOSIS — I5032 Chronic diastolic (congestive) heart failure: Secondary | ICD-10-CM | POA: Insufficient documentation

## 2017-03-11 DIAGNOSIS — Z7982 Long term (current) use of aspirin: Secondary | ICD-10-CM | POA: Diagnosis not present

## 2017-03-11 DIAGNOSIS — I456 Pre-excitation syndrome: Secondary | ICD-10-CM | POA: Diagnosis not present

## 2017-03-11 DIAGNOSIS — Z79899 Other long term (current) drug therapy: Secondary | ICD-10-CM | POA: Insufficient documentation

## 2017-03-11 DIAGNOSIS — Z888 Allergy status to other drugs, medicaments and biological substances status: Secondary | ICD-10-CM | POA: Diagnosis not present

## 2017-03-11 DIAGNOSIS — F419 Anxiety disorder, unspecified: Secondary | ICD-10-CM | POA: Insufficient documentation

## 2017-03-12 NOTE — Progress Notes (Signed)
Primary Care Physician: Myer Peer, MD Referring Physician: [facility] ER f/u Cardiologist: Dr. Therese Sarah is a 76 y.o. male with a h/o  HTN, HLD, COPD chronic home O2, CRI (III), WPW SVT w/unsuccessful ablation, chronic CHF (diastolic).  He was seen last in August, noted his SVT was secondary to antidromic AV reentrant tachycardia. During his ablation, he could make his accessory pathway go away, but then it would come back. He was placed on propafenone therapy.  He was in the ER for palpitations recently, stated he felt his hear race,got scared and called 911.. On monitor in the ER, he has short runs but unclear if it was afib. He would not leave the ER until he was seen by Tommye Standard, PA, who made arrangements to f/u in the Afib clinic. He was placed on Cardizem 180 mg qd and continued propafenone. He has been wearing an event monitor which ended the 16th and he was in the  ER the 19th.  In the  afib clinic, he states that he thinks his heart rhythm has been better since he has been placed on the cardizem.   Today, he denies symptoms of palpitations, chest pain,  orthopnea, PND, lower extremity edema, dizziness, presyncope, syncope, or neurologic sequela.wearing 02 by [place]. The patient is tolerating medications without difficulties and is otherwise without complaint today.   Past Medical History:  Diagnosis Date  . Anxiety   . CAP (community acquired pneumonia) 12/2016  . CKD (chronic kidney disease) stage 3, GFR 30-59 ml/min (HCC)   . COPD (chronic obstructive pulmonary disease) ([place])   . Diastolic CHF, chronic ([place])   . Dyspnea   . HLD (hyperlipidemia)   . HTN (hypertension)   . Ischemic heart disease   . Obesity   . OSA (obstructive sleep apnea)   . Palpitation   . Pulmonary embolism ([place])   . PVC (premature ventricular contraction)   . SOB (shortness of breath)   . SVT (supraventricular tachycardia) ([place])   . Umbilical hernia   . WPW (Wolff-Parkinson-White  syndrome)    Past Surgical History:  Procedure Laterality Date  . CARDIAC CATHETERIZATION N/A 03/16/2016   Procedure: Left Heart Cath and Coronary Angiography;  Surgeon: [place] Sine, MD;  Location: [place] [facility];  Service: Cardiovascular;  Laterality: N/A;  . COLONOSCOPY WITH PROPOFOL N/A 06/11/2014   Procedure: COLONOSCOPY WITH PROPOFOL;  Surgeon: Inda Castle, MD;  [facility];  Service: Endoscopy;  Laterality: N/A;  . ELECTROPHYSIOLOGIC STUDY N/A 03/23/2016   Procedure: Electrophysiology Study;  Surgeon: [place] Lance, MD;  Location: [place] [facility] LAB;  Service: Cardiovascular;  Laterality: N/A;  . ELECTROPHYSIOLOGIC STUDY N/A 03/23/2016   Procedure: SVT Ablation;  Surgeon: Evans Lance, MD;  Location: [place] [facility] LAB;  Service: Cardiovascular;  Laterality: N/A;  . ESOPHAGOGASTRODUODENOSCOPY (EGD) WITH PROPOFOL N/A 06/11/2014   Procedure: ESOPHAGOGASTRODUODENOSCOPY (EGD) WITH PROPOFOL;  Surgeon: Inda Castle, MD;  [facility];  Service: Endoscopy;  Laterality: N/A;  . HERNIA REPAIR      Current Outpatient Medications  Medication Sig Dispense Refill  . acetaminophen (TYLENOL) 325 MG tablet Take 2 tablets (650 mg total) by mouth every 6 (six) hours as needed for mild pain (or Fever >/= 101).    Marland Kitchen albuterol (PROVENTIL) (2.5 MG/3ML) 0.083% nebulizer solution Take 3 mLs (2.5 mg total) by nebulization every 4 (four) hours as needed for wheezing or shortness of breath. 75 mL 0  .  ALPRAZolam (XANAX) 1 MG tablet Take 1 tablet (1 mg total) by mouth 2 (two) times daily as needed for anxiety. 10 tablet 0  . aspirin EC 81 MG tablet Take 1 tablet (81 mg total) by mouth daily. Do not fill 90 tablet 3  . carvedilol (COREG) 25 MG tablet Take 1 tablet (25 mg total) by mouth 2 (two) times daily with a meal. 60 tablet 3  . diltiazem (CARDIZEM CD) 180 MG 24 hr capsule Take 1 capsule (180 mg total) daily by mouth. 20 capsule 0  . ferrous sulfate 325 (65 FE) MG tablet  Take 1 tablet (325 mg total) by mouth daily. 30 tablet 0  . fluticasone (FLONASE) 50 MCG/ACT nasal spray Place 2 sprays into both nostrils daily. 16 g 0  . Fluticasone-Salmeterol (ADVAIR DISKUS) 250-50 MCG/DOSE AEPB Inhale 1 puff into the lungs every 12 (twelve) hours. 60 each 0  . furosemide (LASIX) 40 MG tablet Take 1 tablet (40 mg total) by mouth 2 (two) times daily. 60 tablet 0  . hydrocortisone (ANUSOL-HC) 25 MG suppository Place 1 suppository (25 mg total) rectally 2 (two) times daily. 12 suppository 0  . Methylcobalamin (B-12) 5000 MCG TBDP Take 5,000 Units by mouth daily.    Marland Kitchen omeprazole (PRILOSEC) 20 MG capsule Take 1 capsule (20 mg total) by mouth daily. 30 capsule 0  . OVER THE COUNTER MEDICATION Inhale 1 application into the lungs at bedtime. CPAP with O2    . potassium chloride SA (K-DUR,KLOR-CON) 20 MEQ tablet Take 2 tablets (40 mEq total) by mouth daily. (Patient taking differently: Take 20 mEq 2 (two) times daily by mouth. ) 60 tablet 0  . propafenone (RYTHMOL) 300 MG tablet Take 1 tablet (300 mg total) by mouth 2 (two) times daily. 60 tablet 0  . rosuvastatin (CRESTOR) 10 MG tablet Take 1 tablet (10 mg total) by mouth at bedtime. 30 tablet 0  . tiotropium (SPIRIVA) 18 MCG inhalation capsule Place 1 capsule (18 mcg total) into inhaler and inhale daily. 30 capsule 0  . vitamin C (ASCORBIC ACID) 500 MG tablet Take 1 tablet (500 mg total) by mouth daily. 30 tablet 0   No current facility-administered medications for this encounter.     Allergies  Allergen Reactions  . Eliquis [Apixaban] Other (See Comments)    Bleeding event  . Codeine Other (See Comments)    Cant remember    Social History   Socioeconomic History  . Marital status: Divorced    Spouse name: Not on file  . Number of children: 2  . Years of education: Not on file  . Highest education level: Not on file  Social Needs  . Financial resource strain: Not on file  . Food insecurity - worry: Not on file  .  Food insecurity - inability: Not on file  . Transportation needs - medical: Not on file  . Transportation needs - non-medical: Not on file  Occupational History  . Occupation: retired     Fish farm manager: [employer]  Tobacco Use  . Smoking status: Former Smoker    Last attempt to quit: 04/13/1996    Years since quitting: 20.9  . Smokeless tobacco: Current User    Types: Chew  . Tobacco comment: chews once a day  Substance and Sexual Activity  . Alcohol use: No  . Drug use: No  . Sexual activity: Not Currently  Other Topics Concern  . Not on file  Social History Narrative  . Not on file    Family  History  Problem Relation Age of Onset  . Liver cancer Unknown   . Heart disease Unknown     ROS- All systems are reviewed and negative except as per the HPI above  Physical Exam: Vitals:   03/11/17 1451  BP: 122/64  Pulse: 77  Weight: 265 lb 9.6 oz (120.5 kg)  Height: 5\' 11"  (1.803 m)   Wt Readings from Last 3 Encounters:  03/11/17 265 lb 9.6 oz (120.5 kg)  02/03/17 255 lb (115.7 kg)  01/14/17 263 lb (119.3 kg)    Labs: Lab Results  Component Value Date   NA 140 03/01/2017   K 4.0 03/01/2017   CL 105 03/01/2017   CO2 27 03/01/2017   GLUCOSE 119 (H) 03/01/2017   BUN 20 03/01/2017   CREATININE 1.00 03/01/2017   CALCIUM 8.4 (L) 03/01/2017   PHOS 3.7 12/17/2016   MG 2.2 03/01/2017   Lab Results  Component Value Date   INR 1.18 12/16/2016   No results found for: CHOL, HDL, LDLCALC, TRIG   GEN- The patient is well appearing, alert and oriented x 3 today.   Head- normocephalic, atraumatic Eyes-  Sclera clear, conjunctiva pink Ears- hearing intact Oropharynx- clear Neck- supple, no JVP Lymph- no cervical lymphadenopathy Lungs- Clear to ausculation bilaterally, normal work of breathing Heart- Regular rate and rhythm, no murmurs, rubs or gallops, PMI not laterally displaced GI- soft, NT, ND, + BS Extremities- no clubbing, cyanosis, or edema MS- no significant deformity  or atrophy Skin- no rash or lesion Psych- euthymic mood, full affect Neuro- strength and sensation are intact  EKG- Sinus rhythm at 77 bpm with fusion complexes, LAFB, pr int 77 bpm, 30 event monitor reviewed worn 10/18 to 11/16    Assessment and Plan: 1. H/o WPW with palpitations  Reviewed the data from the event monitor, but it has not been read by Dr. Lovena Le Heart rate histogram is fairly  flat with HR  stable in the 80 range, pt reported many instances of rapid heart rate but per histogram HR did not change. Af burden was read out at 0%. Tachy was reported out less than 1%, brady less than 1%. Majority of HR was between 80-100. Pt will continue propafenone, cardizem,  Continue asa I do not see convincing afib  burden to say that pt needs anticoagulation or any other change in antiarrythmic's. I will not make any changes but will refer to Dr. Lovena Le to further discuss his palpitations and results of event monitor.  Geroge Baseman Grettel Rames, [facility] [address] [place], [place] [address] [phone]

## 2017-03-15 ENCOUNTER — Other Ambulatory Visit: Payer: Self-pay

## 2017-03-15 DIAGNOSIS — E119 Type 2 diabetes mellitus without complications: Secondary | ICD-10-CM | POA: Diagnosis not present

## 2017-03-15 DIAGNOSIS — G250 Essential tremor: Secondary | ICD-10-CM | POA: Diagnosis not present

## 2017-03-15 DIAGNOSIS — Z6837 Body mass index (BMI) 37.0-37.9, adult: Secondary | ICD-10-CM | POA: Diagnosis not present

## 2017-03-15 DIAGNOSIS — Z125 Encounter for screening for malignant neoplasm of prostate: Secondary | ICD-10-CM | POA: Diagnosis not present

## 2017-03-15 DIAGNOSIS — A46 Erysipelas: Secondary | ICD-10-CM | POA: Diagnosis not present

## 2017-03-15 DIAGNOSIS — E663 Overweight: Secondary | ICD-10-CM | POA: Diagnosis not present

## 2017-03-15 DIAGNOSIS — I10 Essential (primary) hypertension: Secondary | ICD-10-CM | POA: Diagnosis not present

## 2017-03-15 NOTE — Patient Outreach (Signed)
Telephone call/ case closure: Vitals:   03/15/17 1022  Weight: 260 lb (117.9 kg)   [name] who answered and reports that he is "ok". States he attended the a fib clinic last week and no changes were made. Reports that he is waiting for the office to call and make him an appointment with Dr. Lovena Le for monitor review and medication adjustment if needed. Reports that his left leg and foot are swollen with blood spots. Report clinic looked at this last week and no changes were made. Patient reports weight is unchanged at 260. States he is planning to make an appointment to see primary MD asap. He is calling today.   Reviewed completion of [facility] program with no new goals identified. Reviewed with patient to call me if needs arise in the future. He agreed.  PLAN: Close case. Goals met. Will send patient letter and MD letter.  Tomasa Rand, RN, BSN, CEN [facility] [employer] [phone]

## 2017-03-30 ENCOUNTER — Encounter: Payer: Self-pay | Admitting: Nurse Practitioner

## 2017-03-30 ENCOUNTER — Ambulatory Visit: Payer: Medicare HMO | Admitting: Nurse Practitioner

## 2017-03-30 VITALS — BP 147/80 | HR 71 | Ht 71.0 in | Wt 264.0 lb

## 2017-03-30 DIAGNOSIS — I471 Supraventricular tachycardia: Secondary | ICD-10-CM

## 2017-03-30 MED ORDER — VERAPAMIL HCL ER 240 MG PO TBCR: 240.0000 mg | EXTENDED_RELEASE_TABLET | Freq: Every day | ORAL | 3 refills | Status: DC

## 2017-03-30 MED ORDER — VERAPAMIL HCL ER 240 MG PO TBCR: 240.0000 mg | EXTENDED_RELEASE_TABLET | Freq: Two times a day (BID) | ORAL | 3 refills | Status: DC

## 2017-03-30 NOTE — Progress Notes (Signed)
Electrophysiology Office Note Date: 03/31/2017  ID:  Chad, Hogan 08/26/1940, MRN [id number]  PCP: Myer Peer, MD Electrophysiologist: Lovena Le  CC: SVT follow up  Chad Hogan is a 76 y.o. male seen today for Dr Lovena Le.  He presents today for routine electrophysiology followup.  Since last being seen in our clinic, the patient reports doing reasonably well.  He continues with intermittent palpitations which are worrisome for him. He has been out of Diltiazem for several days. He reports that prior to initial ablation, he was on Verapamil that controlled arrhythmias well and asks about resuming this today.  He denies chest pain, dyspnea (above baseline), PND, orthopnea, nausea, vomiting, dizziness, syncope, edema, weight gain, or early satiety.  Past Medical History:  Diagnosis Date  . Anxiety   . CAP (community acquired pneumonia) 12/2016  . CKD (chronic kidney disease) stage 3, GFR 30-59 ml/min (HCC)   . COPD (chronic obstructive pulmonary disease) ([place])   . Diastolic CHF, chronic ([place])   . HLD (hyperlipidemia)   . HTN (hypertension)   . Ischemic heart disease   . Obesity   . OSA (obstructive sleep apnea)   . Palpitation   . Pulmonary embolism ([place])   . PVC (premature ventricular contraction)   . SVT (supraventricular tachycardia) ([place])   . Umbilical hernia   . WPW (Wolff-Parkinson-White syndrome)    Past Surgical History:  Procedure Laterality Date  . CARDIAC CATHETERIZATION N/A 03/16/2016   Procedure: Left Heart Cath and Coronary Angiography;  Surgeon: [place] Sine, MD;  Location: [place] [facility];  Service: Cardiovascular;  Laterality: N/A;  . COLONOSCOPY WITH PROPOFOL N/A 06/11/2014   Procedure: COLONOSCOPY WITH PROPOFOL;  Surgeon: Inda Castle, MD;  [facility];  Service: Endoscopy;  Laterality: N/A;  . ELECTROPHYSIOLOGIC STUDY N/A 03/23/2016   Procedure: Electrophysiology Study;  Surgeon: Evans Lance, MD;  Location: [place] [facility];   Service: Cardiovascular;  Laterality: N/A;  . ELECTROPHYSIOLOGIC STUDY N/A 03/23/2016   Procedure: SVT Ablation;  Surgeon: Evans Lance, MD;  Location: [place] [facility] LAB;  Service: Cardiovascular;  Laterality: N/A;  . ESOPHAGOGASTRODUODENOSCOPY (EGD) WITH PROPOFOL N/A 06/11/2014   Procedure: ESOPHAGOGASTRODUODENOSCOPY (EGD) WITH PROPOFOL;  Surgeon: Inda Castle, MD;  [facility];  Service: Endoscopy;  Laterality: N/A;  . HERNIA REPAIR      Current Outpatient Medications  Medication Sig Dispense Refill  . ALPRAZolam (XANAX) 1 MG tablet Take 1 tablet (1 mg total) by mouth 2 (two) times daily as needed for anxiety. 10 tablet 0  . aspirin EC 81 MG tablet Take 1 tablet (81 mg total) by mouth daily. Do not fill 90 tablet 3  . carvedilol (COREG) 25 MG tablet Take 1 tablet (25 mg total) by mouth 2 (two) times daily with a meal. 60 tablet 3  . ferrous sulfate 325 (65 FE) MG tablet Take 1 tablet (325 mg total) by mouth daily. 30 tablet 0  . fluticasone (FLONASE) 50 MCG/ACT nasal spray Place 2 sprays into both nostrils daily. 16 g 0  . Fluticasone-Salmeterol (ADVAIR DISKUS) 250-50 MCG/DOSE AEPB Inhale 1 puff into the lungs every 12 (twelve) hours. 60 each 0  . furosemide (LASIX) 40 MG tablet Take 1 tablet (40 mg total) by mouth 2 (two) times daily. 60 tablet 0  . hydrocortisone (ANUSOL-HC) 25 MG suppository Place 1 suppository (25 mg total) rectally 2 (two) times daily. 12 suppository 0  . Methylcobalamin (B-12) 5000 MCG TBDP Take 5,000  Units by mouth daily.    Marland Kitchen omeprazole (PRILOSEC) 20 MG capsule Take 1 capsule (20 mg total) by mouth daily. 30 capsule 0  . OVER THE COUNTER MEDICATION Inhale 1 application into the lungs at bedtime. CPAP with O2    . potassium chloride SA (K-DUR,KLOR-CON) 20 MEQ tablet Take 2 tablets (40 mEq total) by mouth daily. (Patient taking differently: Take 20 mEq 2 (two) times daily by mouth. ) 60 tablet 0  . propafenone (RYTHMOL) 300 MG tablet Take 1 tablet (300  mg total) by mouth 2 (two) times daily. 60 tablet 0  . rosuvastatin (CRESTOR) 10 MG tablet Take 1 tablet (10 mg total) by mouth at bedtime. 30 tablet 0  . tiotropium (SPIRIVA) 18 MCG inhalation capsule Place 1 capsule (18 mcg total) into inhaler and inhale daily. 30 capsule 0  . vitamin C (ASCORBIC ACID) 500 MG tablet Take 1 tablet (500 mg total) by mouth daily. 30 tablet 0  . verapamil (CALAN-SR) 240 MG CR tablet Take 1 tablet (240 mg total) by mouth 2 (two) times daily. 60 tablet 3   No current facility-administered medications for this visit.     Allergies:   Eliquis [apixaban] and Codeine   Social History: Social History   Socioeconomic History  . Marital status: Divorced    Spouse name: Not on file  . Number of children: 2  . Years of education: Not on file  . Highest education level: Not on file  Social Needs  . Financial resource strain: Not on file  . Food insecurity - worry: Not on file  . Food insecurity - inability: Not on file  . Transportation needs - medical: Not on file  . Transportation needs - non-medical: Not on file  Occupational History  . Occupation: retired     Fish farm manager: [employer]  Tobacco Use  . Smoking status: Former Smoker    Last attempt to quit: 04/13/1996    Years since quitting: 20.9  . Smokeless tobacco: Current User    Types: Chew  . Tobacco comment: chews once a day  Substance and Sexual Activity  . Alcohol use: No  . Drug use: No  . Sexual activity: Not Currently  Other Topics Concern  . Not on file  Social History Narrative  . Not on file    Family History: Family History  Problem Relation Age of Onset  . Liver cancer Unknown   . Heart disease Unknown     Review of Systems: All other systems reviewed and are otherwise negative except as noted above.   Physical Exam: VS:  BP (!) 147/80   Pulse 71   Ht 5\' 11"  (1.803 m)   Wt 264 lb (119.7 kg)   SpO2 90%   BMI 36.82 kg/m  , BMI Body mass index is 36.82 kg/m. Wt Readings from  Last 3 Encounters:  03/30/17 264 lb (119.7 kg)  03/15/17 260 lb (117.9 kg)  03/11/17 265 lb 9.6 oz (120.5 kg)    GEN- The patient is elderly and chronically ill appearing, alert and oriented x 3 today.   HEENT: normocephalic, atraumatic; sclera clear, conjunctiva pink; hearing intact; oropharynx clear; neck supple  Lungs- Clear to ausculation bilaterally, normal work of breathing.  No wheezes, rales, rhonchi Heart- Regular rate and rhythm  GI- soft, non-tender, non-distended, bowel sounds present  Extremities- no clubbing, cyanosis, +trace BLE edema MS- no significant deformity or atrophy Skin- warm and dry, no rash or lesion  Psych- euthymic mood, full affect Neuro- strength  and sensation are intact   EKG:  EKG is not ordered today.  Recent Labs: 12/17/2016: ALT 29; TSH 0.480 03/01/2017: BUN 20; Creatinine, Ser 1.00; Hemoglobin 13.3; Magnesium 2.2; Platelets 154; Potassium 4.0; Sodium 140    Other studies Reviewed: Additional studies/ records that were reviewed today include: Dr Tanna Furry office notes, AF clinic notes   Assessment and Plan:  1.  SVT Left lateral accessory pathway modified but not eliminated during EPS 12/17 Doing relatively well on Propafenone Recent event monitor with no significant arrhythmias - reviewed with patient today  Will stop Diltiazem and resume Verapamil to see if we can control atrial ectopy better He would like to follow up with Dr Caryl Comes - will arrange   Current medicines are reviewed at length with the patient today.   The patient does not have concerns regarding his medicines.  The following changes were made today:  Change Diltiazem to Verapamil   Labs/ tests ordered today include: none No orders of the defined types were placed in this encounter.    Signed, Chanetta Marshall, NP 03/31/2017 2:49 PM   [place] [address] [phone] (office) [phone] (fax)

## 2017-03-30 NOTE — Patient Instructions (Signed)
Medication Instructions:   STOP TAKING DILTIAZEM   START TAKING VERAPAMIL  240 MG TWICE DAY   If you need a refill on your cardiac medications before your next appointment, please call your pharmacy.  Labwork: NONE ORDERED  TODAY    Testing/Procedures:  NONE ORDERED  TODAY      Follow-Up:  3 MONTHS WITH DR  Caryl Comes   Any Other Special Instructions Will Be Listed Below (If Applicable).

## 2017-04-08 DIAGNOSIS — G4733 Obstructive sleep apnea (adult) (pediatric): Secondary | ICD-10-CM | POA: Diagnosis not present

## 2017-04-19 DIAGNOSIS — D3001 Benign neoplasm of right kidney: Secondary | ICD-10-CM | POA: Diagnosis not present

## 2017-05-09 DIAGNOSIS — G4733 Obstructive sleep apnea (adult) (pediatric): Secondary | ICD-10-CM | POA: Diagnosis not present

## 2017-06-09 DIAGNOSIS — G4733 Obstructive sleep apnea (adult) (pediatric): Secondary | ICD-10-CM | POA: Diagnosis not present

## 2017-06-22 ENCOUNTER — Encounter: Payer: Self-pay | Admitting: *Deleted

## 2017-06-29 ENCOUNTER — Ambulatory Visit: Payer: Medicare HMO | Admitting: Internal Medicine

## 2017-06-29 VITALS — BP 114/66 | HR 67 | Wt 269.0 lb

## 2017-06-29 DIAGNOSIS — I471 Supraventricular tachycardia: Secondary | ICD-10-CM

## 2017-06-29 MED ORDER — CARVEDILOL 25 MG PO TABS: 25.0000 mg | ORAL_TABLET | Freq: Two times a day (BID) | ORAL | 3 refills | Status: DC

## 2017-06-29 MED ORDER — VERAPAMIL HCL ER 240 MG PO TBCR: 240.0000 mg | EXTENDED_RELEASE_TABLET | Freq: Two times a day (BID) | ORAL | 3 refills | Status: DC

## 2017-06-29 MED ORDER — PROPAFENONE HCL 300 MG PO TABS: 300.0000 mg | ORAL_TABLET | Freq: Two times a day (BID) | ORAL | 3 refills | Status: DC

## 2017-06-29 NOTE — Progress Notes (Signed)
Patient Care Team: Myer Peer, MD as PCP - General (Unknown Physician Specialty)   HPI  Chad Hogan is a 77 y.o. male Seen in followup for SVT  he has had no significant palpitations. He has a remote history of ventricular preexcitation  He underwent a catheter-based procedure with diagnosis of a left lateral accessory pathway which was only partially modified by ablation.  He was started on propafenone.  Because of ongoing problems with tachypalpitations, he was started on carvedilol with up titration from 12--25 without improvement.  He saw AAS-NP 12/18 he took his advice and put him back on verapamil.  He has had no further palpitations.  He has been off of his carvedilol for the last week or so  He is also had an intercurrent hospitalizationfor congestive heart failure with a 30 pound diuresis   9/18 echocardiogram EF 60-65%   He was diagnosed withpulmonary emboli; he previously had been on ELIQUIS  He is now on chronic oxygen therapy.  His shortness of breath is chronic and stable.  He has peripheral edema.  His dietary intake of sodium and water is excessive.  He sits at home. He has no desire to do anything x watch TV  DATE PR interval QRSduration Dose  6/17  198 120 0  3/19 264 112 300 bid       Past Medical History:  Diagnosis Date  . Anxiety   . CAP (community acquired pneumonia) 12/2016  . CKD (chronic kidney disease) stage 3, GFR 30-59 ml/min (HCC)   . COPD (chronic obstructive pulmonary disease) ([place])   . Diastolic CHF, chronic ([place])   . HLD (hyperlipidemia)   . HTN (hypertension)   . Ischemic heart disease   . Obesity   . OSA (obstructive sleep apnea)   . Palpitation   . Pulmonary embolism ([place])   . PVC (premature ventricular contraction)   . SVT (supraventricular tachycardia) ([place])   . Umbilical hernia   . WPW (Wolff-Parkinson-White syndrome)     Past Surgical History:  Procedure Laterality Date  . CARDIAC CATHETERIZATION N/A 03/16/2016    Procedure: Left Heart Cath and Coronary Angiography;  Surgeon: [place] Sine, MD;  Location: [place] [facility];  Service: Cardiovascular;  Laterality: N/A;  . COLONOSCOPY WITH PROPOFOL N/A 06/11/2014   Procedure: COLONOSCOPY WITH PROPOFOL;  Surgeon: Inda Castle, MD;  [facility];  Service: Endoscopy;  Laterality: N/A;  . ELECTROPHYSIOLOGIC STUDY N/A 03/23/2016   Procedure: Electrophysiology Study;  Surgeon: Evans Lance, MD;  Location: [place] [facility];  Service: Cardiovascular;  Laterality: N/A;  . ELECTROPHYSIOLOGIC STUDY N/A 03/23/2016   Procedure: SVT Ablation;  Surgeon: Evans Lance, MD;  Location: [place] [facility];  Service: Cardiovascular;  Laterality: N/A;  . ESOPHAGOGASTRODUODENOSCOPY (EGD) WITH PROPOFOL N/A 06/11/2014   Procedure: ESOPHAGOGASTRODUODENOSCOPY (EGD) WITH PROPOFOL;  Surgeon: Inda Castle, MD;  [facility];  Service: Endoscopy;  Laterality: N/A;  . HERNIA REPAIR      Current Outpatient Medications  Medication Sig Dispense Refill  . ALPRAZolam (XANAX) 1 MG tablet Take 1 tablet (1 mg total) by mouth 2 (two) times daily as needed for anxiety. 10 tablet 0  . aspirin EC 81 MG tablet Take 1 tablet (81 mg total) by mouth daily. Do not fill 90 tablet 3  . ferrous sulfate 325 (65 FE) MG tablet Take 1 tablet (325 mg total) by mouth daily. 30 tablet 0  . fluticasone (FLONASE) 50 MCG/ACT  nasal spray Place 2 sprays into both nostrils daily. 16 g 0  . Fluticasone-Salmeterol (ADVAIR DISKUS) 250-50 MCG/DOSE AEPB Inhale 1 puff into the lungs every 12 (twelve) hours. 60 each 0  . furosemide (LASIX) 40 MG tablet Take 1 tablet (40 mg total) by mouth 2 (two) times daily. 60 tablet 0  . hydrocortisone (ANUSOL-HC) 25 MG suppository Place 1 suppository (25 mg total) rectally 2 (two) times daily. 12 suppository 0  . Methylcobalamin (B-12) 5000 MCG TBDP Take 5,000 Units by mouth daily.    Marland Kitchen omeprazole (PRILOSEC) 20 MG capsule Take 1 capsule (20 mg total) by  mouth daily. 30 capsule 0  . OVER THE COUNTER MEDICATION Inhale 1 application into the lungs at bedtime. CPAP with O2    . potassium chloride SA (K-DUR,KLOR-CON) 20 MEQ tablet Take 2 tablets (40 mEq total) by mouth daily. (Patient taking differently: Take 20 mEq 2 (two) times daily by mouth. ) 60 tablet 0  . rosuvastatin (CRESTOR) 10 MG tablet Take 1 tablet (10 mg total) by mouth at bedtime. 30 tablet 0  . tiotropium (SPIRIVA) 18 MCG inhalation capsule Place 1 capsule (18 mcg total) into inhaler and inhale daily. 30 capsule 0  . verapamil (CALAN-SR) 240 MG CR tablet Take 1 tablet (240 mg total) by mouth 2 (two) times daily. 180 tablet 3  . vitamin C (ASCORBIC ACID) 500 MG tablet Take 1 tablet (500 mg total) by mouth daily. 30 tablet 0   No current facility-administered medications for this visit.     Allergies  Allergen Reactions  . Eliquis [Apixaban] Other (See Comments)    Bleeding event  . Codeine Other (See Comments)    Cant remember    Review of Systems negative except from HPI and PMH  Physical Exam There were no vitals taken for this visit. Well developed and well nourished in no acute distress HENT normal E scleral and icterus clear Neck Supple JVP flat; carotids brisk and full Clear to ausculation  Regular rate and rhythm, no murmurs gallops or rub Soft with active bowel sounds No clubbing cyanosis 2+  Edema Alert and oriented, grossly normal motor and sensory function Skin Warm and Dry  ECG demonstrates sinus @ 67 26/11/42 LAD  Assessment and  Plan  SVT with loss of preexcitation   status post incomplete ablation of left lateral accessory pathway  Oxygen dependent COPD HFpEF  Morbid obesity  Hyperglycemia/prediabetes  Hypertension   Blood pressure is reasonably controlled  Is volume overloaded.  We have discussed sodium restriction as well as fluid restriction.  I would prefer this as opposed to increased diuretics.  No intercurrent arrhythmias.  We  will discontinue the carvedilol which he has done already.  His blood pressure is tolerating this.  We will have him come back in about 3 months.  He is having no further palpitations, I would like to decrease his propafenone 300--150 mg twice daily-- as his PR interval

## 2017-06-29 NOTE — Patient Instructions (Addendum)
Medication Instructions:  Your physician has recommended you make the following change in your medication:   1. Stop Carvedilol   Labwork: None ordered.  Testing/Procedures: None ordered.  Follow-Up: Your physician recommends that you schedule a follow-up appointment in: 3 months with Chanetta Marshall, PA  Any Other Special Instructions Will Be Listed Below (If Applicable).     If you need a refill on your cardiac medications before your next appointment, please call your pharmacy.

## 2017-06-30 ENCOUNTER — Ambulatory Visit: Payer: Medicare HMO | Admitting: Internal Medicine

## 2017-07-07 DIAGNOSIS — G4733 Obstructive sleep apnea (adult) (pediatric): Secondary | ICD-10-CM | POA: Diagnosis not present

## 2017-07-19 DIAGNOSIS — R609 Edema, unspecified: Secondary | ICD-10-CM | POA: Diagnosis not present

## 2017-07-19 DIAGNOSIS — Z6837 Body mass index (BMI) 37.0-37.9, adult: Secondary | ICD-10-CM | POA: Diagnosis not present

## 2017-07-19 DIAGNOSIS — D219 Benign neoplasm of connective and other soft tissue, unspecified: Secondary | ICD-10-CM | POA: Diagnosis not present

## 2017-07-28 ENCOUNTER — Telehealth: Payer: Self-pay

## 2017-07-28 NOTE — Telephone Encounter (Signed)
Sent referral to scheduling 

## 2017-08-07 DIAGNOSIS — G4733 Obstructive sleep apnea (adult) (pediatric): Secondary | ICD-10-CM | POA: Diagnosis not present

## 2017-09-06 DIAGNOSIS — G4733 Obstructive sleep apnea (adult) (pediatric): Secondary | ICD-10-CM | POA: Diagnosis not present

## 2017-09-13 NOTE — Progress Notes (Signed)
Cardiology Office Note Date:  09/14/2017  Patient ID:  Chad, Hogan Feb 13, 1941, MRN [id number] PCP:  Myer Peer, MD  Electrophysiologist:  Dr. Caryl Comes   Chief Complaint: hospital f/u  History of Present Illness: Chad Hogan is a 77 y.o. male with history of HTN, HLD, COPD chronic home O2, CRI (III), WPW SVT w/unsuccessful ablation, chronic CHF (diastolic).  Dr. Lovena Le,  last saw him in August, noted his SVT was secondary to antidromic AV reentrant tachycardia. During his ablation, he could make his accessory pathway go away, but then it would come back. He was placed on propafenone therapy and had done well.  He was hospitalized Sept 2018 with resp insufficiency 2/2 multifocal pneumonia, COPD exacerbation, record indicated an observed episode of VT, also noted to have 4.2 ascending aortic aneurysm recommended for annual imaging.  He was admitted for 13 days, discharged on 12/29/16  He had f/u with some palpitations his coreg was increased, and an ER visit in Nov 2018 with palpitations, noted PAcs' no clear Afib or flutter, dilt was started.  F.u with the AFib clinic noted Reviewed the data from the event monitor, but it has not been read by Dr. Lovena Le.  Heart rate histogram is fairly  flat with HR  stable in the 80 range, pt reported many instances of rapid heart rate but per histogram HR did not change. Af burden was read out at 0%. Tachy was reported out less than 1%, brady less than 1%. Majority of HR was between 80-100. Pt will continue propafenone, cardizem.  He had EP f/u and requested his dilt be changed to verapamil that he had historically been on and felt well with   He comes today to be seen for Dr. Caryl Comes, last seen by him in March, at that visit had been without his coreg for several days. He discussed reducing the Propafenone at f/u if no palpitations given PR interval, he was felt to be fluid OL and discussed sodium restriction/dietary compliance (preferred over diuretic  increase).    He saw his PMD in April with persistent edema, reported with blisters, his lasix was increased and cxr done without noting any pulm edema.  He is doing m,uch better and states his edema is completely resolved.  He took the additional lasix for a week and resumed his usual dose as instructed by his PMD, though reducing his sodium has seemed to make the biggest impact and has not had recurrent swelling.  He denies any CP, no palpitations since back on the verapamil.  He would like to come off the propafenone.  He has not had any dizziness, near syncope or syncope.  He is on chronic O2 and reports his respiratory status at his baseline.  He mentions for a few years he has had a tremor to his hands, though in the last few months, worse, he has discussed this with his PMD,  and will f/u there for this  AAD: Intolerant of flecainide Currently Rhythmol  Past Medical History:  Diagnosis Date  . Anxiety   . CAP (community acquired pneumonia) 12/2016  . CKD (chronic kidney disease) stage 3, GFR 30-59 ml/min (HCC)   . COPD (chronic obstructive pulmonary disease) ([place])   . Diastolic CHF, chronic ([place])   . HLD (hyperlipidemia)   . HTN (hypertension)   . Ischemic heart disease   . Obesity   . OSA (obstructive sleep apnea)   . Palpitation   . Pulmonary embolism ([place])   .  PVC (premature ventricular contraction)   . SVT (supraventricular tachycardia) ([place])   . Umbilical hernia   . WPW (Wolff-Parkinson-White syndrome)     Past Surgical History:  Procedure Laterality Date  . CARDIAC CATHETERIZATION N/A 03/16/2016   Procedure: Left Heart Cath and Coronary Angiography;  Surgeon: Troy Sine, MD;  Location: [facility];  Service: Cardiovascular;  Laterality: N/A;  . COLONOSCOPY WITH PROPOFOL N/A 06/11/2014   Procedure: COLONOSCOPY WITH PROPOFOL;  Surgeon: Inda Castle, MD;  Location: [facility];  Service: Endoscopy;  Laterality: N/A;  . ELECTROPHYSIOLOGIC STUDY N/A  03/23/2016   Procedure: Electrophysiology Study;  Surgeon: Evans Lance, MD;  Location: [facility];  Service: Cardiovascular;  Laterality: N/A;  . ELECTROPHYSIOLOGIC STUDY N/A 03/23/2016   Procedure: SVT Ablation;  Surgeon: Evans Lance, MD;  Location: [facility];  Service: Cardiovascular;  Laterality: N/A;  . ESOPHAGOGASTRODUODENOSCOPY (EGD) WITH PROPOFOL N/A 06/11/2014   Procedure: ESOPHAGOGASTRODUODENOSCOPY (EGD) WITH PROPOFOL;  Surgeon: Inda Castle, MD;  [facility];  Service: Endoscopy;  Laterality: N/A;  . HERNIA REPAIR      Current Outpatient Medications  Medication Sig Dispense Refill  . ALPRAZolam (XANAX) 1 MG tablet Take 1 tablet (1 mg total) by mouth 2 (two) times daily as needed for anxiety. 10 tablet 0  . aspirin EC 81 MG tablet Take 1 tablet (81 mg total) by mouth daily. Do not fill 90 tablet 3  . ferrous sulfate 325 (65 FE) MG tablet Take 1 tablet (325 mg total) by mouth daily. 30 tablet 0  . fluticasone (FLONASE) 50 MCG/ACT nasal spray Place 2 sprays into both nostrils daily. 16 g 0  . Fluticasone-Salmeterol (ADVAIR DISKUS) 250-50 MCG/DOSE AEPB Inhale 1 puff into the lungs every 12 (twelve) hours. 60 each 0  . furosemide (LASIX) 40 MG tablet Take 1 tablet (40 mg total) by mouth 2 (two) times daily. 60 tablet 0  . hydrocortisone (ANUSOL-HC) 25 MG suppository Place 1 suppository (25 mg total) rectally 2 (two) times daily. 12 suppository 0  . Methylcobalamin (B-12) 5000 MCG TBDP Take 5,000 Units by mouth daily.    Marland Kitchen omeprazole (PRILOSEC) 20 MG capsule Take 1 capsule (20 mg total) by mouth daily. 30 capsule 0  . OVER THE COUNTER MEDICATION Inhale 1 application into the lungs at bedtime. CPAP with O2    . potassium chloride SA (K-DUR,KLOR-CON) 20 MEQ tablet Take 2 tablets (40 mEq total) by mouth daily. (Patient taking differently: Take 20 mEq 2 (two) times daily by mouth. ) 60 tablet 0  . propafenone (RYTHMOL) 300 MG tablet Take 1 tablet (300 mg  total) by mouth 2 (two) times daily. 180 tablet 3  . rosuvastatin (CRESTOR) 10 MG tablet Take 1 tablet (10 mg total) by mouth at bedtime. 30 tablet 0  . tiotropium (SPIRIVA) 18 MCG inhalation capsule Place 1 capsule (18 mcg total) into inhaler and inhale daily. 30 capsule 0  . verapamil (CALAN-SR) 240 MG CR tablet Take 1 tablet (240 mg total) by mouth 2 (two) times daily. 180 tablet 3  . vitamin C (ASCORBIC ACID) 500 MG tablet Take 1 tablet (500 mg total) by mouth daily. 30 tablet 0   No current facility-administered medications for this visit.     Allergies:   Eliquis [apixaban] and Codeine   Social History:  The patient  reports that he quit smoking about 21 years ago. His smokeless tobacco use includes chew. He reports that he does not drink alcohol  or use drugs.   Family History:  The patient's family history includes Heart disease in his unknown relative; Liver cancer in his unknown relative.  ROS:  Please see the history of present illness.    All other systems are reviewed and otherwise negative.   PHYSICAL EXAM:  VS:  BP 124/76   Pulse 66   Ht 5\' 11"  (1.803 m)   Wt 264 lb (119.7 kg)   SpO2 94%   BMI 36.82 kg/m  BMI: Body mass index is 36.82 kg/m. Well nourished, well developed, in no acute distress  HEENT: normocephalic, atraumatic  Neck: no JVD, carotid bruits or masses Cardiac:   RRR; no significant murmurs, no rubs, or gallops Lungs:  CTA b/l, no wheezing, soft rhonchi b/l, no rales, on n/c O2 Abd: soft, nontender MS: no deformity, age appropriate atrophy Ext: trace edema  Skin: warm and dry, no rash Neuro:  No gross deficits appreciated Psych: euthymic mood, full affect    EKG:  Done today and reviewed by myself is SR, 66bpm, PR 265ms, QRS 139ms, QTc 439ms  12/20/16: TTE Study Conclusions - Left ventricle: The cavity size was normal. Wall thickness was   normal. Systolic function was normal. The estimated ejection   fraction was in the range of 60% to 65%.  Wall motion was normal;   there were no regional wall motion abnormalities. Doppler   parameters are consistent with abnormal left ventricular   relaxation (grade 1 diastolic dysfunction). - Right ventricle: The cavity size was mildly dilated. Wall   thickness was normal.  03/23/16: EPS, Dr. Lovena Le Conclusion: Unsuccessful catheter ablation of AV reentrant tachycardia utilizing a left posterolateral accessory pathway. The tachycardia pathway could be successfully ablated, but unfortunately the temperature during radiofrequency energy application was never over 50 and accessory pathway conduction would recur within several minutes of delivering radiofrequency energy. Attempts to ablate on the ventricular side of the accessory pathway were unsuccessful. The patient's atrial flutter was not targeted for catheter ablation.   03/16/16: LHC  Prox LAD lesion, 10 %stenosed.  No significant coronary obstructive disease with a smooth mild proximal to mid 10% narrowing in the LAD after the takeoff of the first diagonal vessel with an area of mild mid systolic bridging in the mid LAD; normal left circumflex and right coronary arteries. LVEDP 13 mm Hg; in this patient with echo EF of 60-65% without wall motion abnormalities.    Recent Labs: 12/17/2016: ALT 29; TSH 0.480 03/01/2017: BUN 20; Creatinine, Ser 1.00; Hemoglobin 13.3; Magnesium 2.2; Platelets 154; Potassium 4.0; Sodium 140  No results found for requested labs within last 8760 hours.   CrCl cannot be calculated (Patient's most recent lab result is older than the maximum 21 days allowed.).   Wt Readings from Last 3 Encounters:  09/14/17 264 lb (119.7 kg)  06/29/17 269 lb (122 kg)  03/30/17 264 lb (119.7 kg)     Other studies reviewed: Additional studies/records reviewed today include: summarized above  ASSESSMENT AND PLAN:  1. WPW, SVT     In review of record, hx of Aflutter (?during and EPS) taken off his Eliquis with a  LGI/heorrhoidal bleed     No palpitations      Will reduce his propafenone to 150mg  BID with 1st degree AVBlock as discussed in Dr. Olin Pia note  The patient would like to consider stopping, will follow up in the next few months to see how he does onlowered dose   2. HTN     Looks  OK, no chnages  3.  Chronic CHF (diastolic)      Edema is much improved by patient's report      Trace if any pitting edema is appreciated today  4. Hand tremors     He reports chronic for years, worse in the last couple months     C/w his PMD  Disposition: 3-4 months, sooner if needed  Current medicines are reviewed at length with the patient today.  The patient did not have any concerns regarding medicines.  Venetia Night, PA-C 09/14/2017 3:26 PM     [place] [address] [phone] (office)  [phone] (fax)

## 2017-09-14 ENCOUNTER — Encounter (INDEPENDENT_AMBULATORY_CARE_PROVIDER_SITE_OTHER): Payer: Self-pay

## 2017-09-14 ENCOUNTER — Ambulatory Visit: Payer: Medicare HMO | Admitting: Physician Assistant

## 2017-09-14 ENCOUNTER — Encounter: Payer: Self-pay | Admitting: Physician Assistant

## 2017-09-14 VITALS — BP 124/76 | HR 66 | Ht 71.0 in | Wt 264.0 lb

## 2017-09-14 DIAGNOSIS — I456 Pre-excitation syndrome: Secondary | ICD-10-CM

## 2017-09-14 DIAGNOSIS — Z79899 Other long term (current) drug therapy: Secondary | ICD-10-CM | POA: Diagnosis not present

## 2017-09-14 DIAGNOSIS — I5032 Chronic diastolic (congestive) heart failure: Secondary | ICD-10-CM

## 2017-09-14 DIAGNOSIS — I471 Supraventricular tachycardia: Secondary | ICD-10-CM

## 2017-09-14 DIAGNOSIS — I10 Essential (primary) hypertension: Secondary | ICD-10-CM | POA: Diagnosis not present

## 2017-09-14 MED ORDER — PROPAFENONE HCL 300 MG PO TABS: 150.0000 mg | ORAL_TABLET | Freq: Two times a day (BID) | ORAL | 3 refills | Status: DC

## 2017-09-14 NOTE — Patient Instructions (Addendum)
Medication Instructions:   START TAKING RYTHMOL 150 MG TWICE A DAY ( CUT 300 MG IN HALF )  If you need a refill on your cardiac medications before your next appointment, please call your pharmacy.  Labwork: NONE ORDERED  TODAY    Testing/Procedures: NONE ORDERED  TODAY    Follow-Up: 3 TO 4 MONTHS WITH DR Caryl Comes    Any Other Special Instructions Will Be Listed Below (If Applicable).

## 2017-10-07 DIAGNOSIS — G4733 Obstructive sleep apnea (adult) (pediatric): Secondary | ICD-10-CM | POA: Diagnosis not present

## 2017-11-06 DIAGNOSIS — G4733 Obstructive sleep apnea (adult) (pediatric): Secondary | ICD-10-CM | POA: Diagnosis not present

## 2017-11-16 ENCOUNTER — Other Ambulatory Visit: Payer: Self-pay | Admitting: Internal Medicine

## 2017-11-16 MED ORDER — VERAPAMIL HCL ER 240 MG PO TBCR: 240.0000 mg | EXTENDED_RELEASE_TABLET | Freq: Two times a day (BID) | ORAL | 3 refills | Status: DC

## 2017-11-16 NOTE — Telephone Encounter (Signed)
New Message     *STAT* If patient is at the pharmacy, call can be transferred to refill team.   1. Which medications need to be refilled? (please list name of each medication and dose if known) verapamil (CALAN-SR) 240 MG CR tablet    2. Which pharmacy/location (including street and city if local pharmacy) is medication to be sent to? [place], [place]  3. Do they need a 30 day or 90 day supply? 90 day

## 2017-11-16 NOTE — Telephone Encounter (Signed)
Pt's medication was sent to pt's pharmacy as requested. Confirmation received.  °

## 2017-11-29 DIAGNOSIS — H2513 Age-related nuclear cataract, bilateral: Secondary | ICD-10-CM | POA: Diagnosis not present

## 2017-12-07 DIAGNOSIS — G4733 Obstructive sleep apnea (adult) (pediatric): Secondary | ICD-10-CM | POA: Diagnosis not present

## 2018-01-04 ENCOUNTER — Encounter: Payer: Self-pay | Admitting: Internal Medicine

## 2018-01-04 DIAGNOSIS — Z6838 Body mass index (BMI) 38.0-38.9, adult: Secondary | ICD-10-CM | POA: Diagnosis not present

## 2018-01-04 DIAGNOSIS — Z0000 Encounter for general adult medical examination without abnormal findings: Secondary | ICD-10-CM | POA: Diagnosis not present

## 2018-01-04 DIAGNOSIS — E119 Type 2 diabetes mellitus without complications: Secondary | ICD-10-CM | POA: Diagnosis not present

## 2018-01-04 DIAGNOSIS — E782 Mixed hyperlipidemia: Secondary | ICD-10-CM | POA: Diagnosis not present

## 2018-01-04 DIAGNOSIS — J449 Chronic obstructive pulmonary disease, unspecified: Secondary | ICD-10-CM | POA: Diagnosis not present

## 2018-01-04 DIAGNOSIS — Z79899 Other long term (current) drug therapy: Secondary | ICD-10-CM | POA: Diagnosis not present

## 2018-01-04 DIAGNOSIS — Z23 Encounter for immunization: Secondary | ICD-10-CM | POA: Diagnosis not present

## 2018-01-04 DIAGNOSIS — F4320 Adjustment disorder, unspecified: Secondary | ICD-10-CM | POA: Diagnosis not present

## 2018-01-05 DIAGNOSIS — H25813 Combined forms of age-related cataract, bilateral: Secondary | ICD-10-CM | POA: Diagnosis not present

## 2018-01-07 DIAGNOSIS — G4733 Obstructive sleep apnea (adult) (pediatric): Secondary | ICD-10-CM | POA: Diagnosis not present

## 2018-01-18 ENCOUNTER — Ambulatory Visit: Payer: Medicare HMO | Admitting: Internal Medicine

## 2018-01-25 DIAGNOSIS — H40003 Preglaucoma, unspecified, bilateral: Secondary | ICD-10-CM | POA: Diagnosis not present

## 2018-01-25 DIAGNOSIS — H25812 Combined forms of age-related cataract, left eye: Secondary | ICD-10-CM | POA: Diagnosis not present

## 2018-02-04 ENCOUNTER — Encounter: Payer: Self-pay | Admitting: *Deleted

## 2018-02-06 DIAGNOSIS — G4733 Obstructive sleep apnea (adult) (pediatric): Secondary | ICD-10-CM | POA: Diagnosis not present

## 2018-02-28 ENCOUNTER — Ambulatory Visit: Payer: Medicare HMO | Admitting: Internal Medicine

## 2018-02-28 ENCOUNTER — Encounter: Payer: Self-pay | Admitting: Internal Medicine

## 2018-02-28 VITALS — BP 130/78 | HR 69 | Ht 71.0 in | Wt 271.0 lb

## 2018-02-28 DIAGNOSIS — I456 Pre-excitation syndrome: Secondary | ICD-10-CM

## 2018-02-28 DIAGNOSIS — I471 Supraventricular tachycardia: Secondary | ICD-10-CM | POA: Diagnosis not present

## 2018-02-28 NOTE — Patient Instructions (Addendum)
Medication Instructions:   Your physician recommends that you continue on your current medications as directed. Please refer to the Current Medication list given to you today.   If you need a refill on your cardiac medications before your next appointment, please call your pharmacy.   Lab work:  None ordered   Testing/Procedures: None ordered    Follow-Up: At [facility], you and your health needs are our priority.  As part of our continuing mission to provide you with exceptional heart care, we have created designated Provider Care Teams.  These Care Teams include your primary Cardiologist (physician) and Advanced Practice Providers (APPs -  Physician Assistants and Nurse Practitioners) who all work together to provide you with the care you need, when you need it. . You will need a follow up appointment in 6 months with Chanetta Marshall, NP  Please call our office 2 months in advance to schedule this appointment.

## 2018-02-28 NOTE — Progress Notes (Signed)
Patient Care Team: Myer Peer, MD as PCP - General (Unknown Physician Specialty)   HPI  Chad Hogan is a 77 y.o. male Seen in followup for SVT  he has had no significant palpitations. He has a remote history of ventricular preexcitation  He underwent a catheter-based procedure with diagnosis of a left lateral accessory pathway which was only partially modified by ablation.  He was started on propafenone.  Because of ongoing problems with tachypalpitations, he was started on carvedilol with up titration from 12--25 without improvement.  He saw AS-NP 12/18  put him back on verapamil.  He has had no further palpitations.  He is also had an intercurrent hospitalization for congestive heart failure with a 30 pound diuresis   9/18 echocardiogram EF 60-65%   He was diagnosed wit hpulmonary emboli; he previously had been on ELIQUIS  He has been able to get rid of most of his peripheral edema with limiting the salt from his diet.  He remains relatively inactive.  He enjoys watching football.  He showed me an MRI report with a concern of a malignancy in his right kidney from a year ago.  He had not yet heard from urology about a scheduled repeat scan.    DATE PR interval QRSduration Dose  6/17  198 120 0  3/19 264 112 300 bid  11/19 224 128 300            Past Medical History:  Diagnosis Date  . Anxiety   . CAP (community acquired pneumonia) 12/2016  . CKD (chronic kidney disease) stage 3, GFR 30-59 ml/min (HCC)   . COPD (chronic obstructive pulmonary disease) ([place])   . Diastolic CHF, chronic ([place])   . HLD (hyperlipidemia)   . HTN (hypertension)   . Ischemic heart disease   . Obesity   . OSA (obstructive sleep apnea)   . Palpitation   . Pulmonary embolism ([place])   . PVC (premature ventricular contraction)   . SVT (supraventricular tachycardia) ([place])   . Umbilical hernia   . WPW (Wolff-Parkinson-White syndrome)     Past Surgical History:  Procedure Laterality  Date  . CARDIAC CATHETERIZATION N/A 03/16/2016   Procedure: Left Heart Cath and Coronary Angiography;  Surgeon: Troy Sine, MD;  Location: [facility];  Service: Cardiovascular;  Laterality: N/A;  . COLONOSCOPY WITH PROPOFOL N/A 06/11/2014   Procedure: COLONOSCOPY WITH PROPOFOL;  Surgeon: Inda Castle, MD;  [facility];  Service: Endoscopy;  Laterality: N/A;  . ELECTROPHYSIOLOGIC STUDY N/A 03/23/2016   Procedure: Electrophysiology Study;  Surgeon: Evans Lance, MD;  Location: [facility];  Service: Cardiovascular;  Laterality: N/A;  . ELECTROPHYSIOLOGIC STUDY N/A 03/23/2016   Procedure: SVT Ablation;  Surgeon: Evans Lance, MD;  Location: [facility];  Service: Cardiovascular;  Laterality: N/A;  . ESOPHAGOGASTRODUODENOSCOPY (EGD) WITH PROPOFOL N/A 06/11/2014   Procedure: ESOPHAGOGASTRODUODENOSCOPY (EGD) WITH PROPOFOL;  Surgeon: Inda Castle, MD;  [facility];  Service: Endoscopy;  Laterality: N/A;  . HERNIA REPAIR      Current Outpatient Medications  Medication Sig Dispense Refill  . ALPRAZolam (XANAX) 1 MG tablet Take 1 tablet (1 mg total) by mouth 2 (two) times daily as needed for anxiety. 10 tablet 0  . aspirin EC 81 MG tablet Take 1 tablet (81 mg total) by mouth daily. Do not fill 90 tablet 3  . ferrous sulfate 325 (65 FE) MG tablet Take 1 tablet (325  mg total) by mouth daily. 30 tablet 0  . Fluticasone-Salmeterol (ADVAIR DISKUS) 250-50 MCG/DOSE AEPB Inhale 1 puff into the lungs every 12 (twelve) hours. 60 each 0  . furosemide (LASIX) 40 MG tablet Take 1 tablet (40 mg total) by mouth 2 (two) times daily. 60 tablet 0  . hydrocortisone (ANUSOL-HC) 25 MG suppository Place 1 suppository (25 mg total) rectally 2 (two) times daily. 12 suppository 0  . Methylcobalamin (B-12) 5000 MCG TBDP Take 5,000 Units by mouth daily.    Marland Kitchen omeprazole (PRILOSEC) 20 MG capsule Take 1 capsule (20 mg total) by mouth daily. 30 capsule 0  . OVER THE COUNTER  MEDICATION Inhale 1 application into the lungs at bedtime. CPAP with O2    . potassium chloride SA (K-DUR,KLOR-CON) 20 MEQ tablet Take 2 tablets (40 mEq total) by mouth daily. 60 tablet 0  . propafenone (RYTHMOL) 300 MG tablet Take 0.5 tablets (150 mg total) by mouth 2 (two) times daily. 90 tablet 3  . rosuvastatin (CRESTOR) 10 MG tablet Take 1 tablet (10 mg total) by mouth at bedtime. 30 tablet 0  . tiotropium (SPIRIVA) 18 MCG inhalation capsule Place 1 capsule (18 mcg total) into inhaler and inhale daily. 30 capsule 0  . verapamil (CALAN-SR) 240 MG CR tablet Take 1 tablet (240 mg total) by mouth 2 (two) times daily. 180 tablet 3   No current facility-administered medications for this visit.     Allergies  Allergen Reactions  . Eliquis [Apixaban] Other (See Comments)    Bleeding event  . Codeine Other (See Comments)    Cant remember    Review of Systems negative except from HPI and PMH  Physical Exam BP 130/78   Pulse 69   Ht 5\' 11"  (1.803 m)   Wt 271 lb (122.9 kg)   SpO2 94%   BMI 37.80 kg/m  Well developed and Morbidly obese in no acute distress HENT normal Neck supple with JVP-flat Clear Regular rate and rhythm, no murmurs or gallops Abd-soft with active BS No Clubbing cyanosis tr edema Skin-warm and dry A & Oriented  Grossly normal sensory and motor function   ECG demonstrates sinus at 69 Interval 22/13/40 Poor R wave progression Left axis deviation  Assessment and  Plan  SVT with loss of preexcitation   status post incomplete ablation of left lateral accessory pathway  Oxygen dependent COPD  HFpEF  Morbid obesity  Hypertension  BP well controlled  No SVT  No preexcitation-- continue propafenone and verapamil  Euvolemic continue current meds  QRS actually shorter than last year  Staff messaged Dr Louis Meckel re MRI followup

## 2018-03-09 DIAGNOSIS — G4733 Obstructive sleep apnea (adult) (pediatric): Secondary | ICD-10-CM | POA: Diagnosis not present

## 2018-03-14 DIAGNOSIS — N2889 Other specified disorders of kidney and ureter: Secondary | ICD-10-CM | POA: Diagnosis not present

## 2018-03-14 DIAGNOSIS — D3000 Benign neoplasm of unspecified kidney: Secondary | ICD-10-CM | POA: Diagnosis not present

## 2018-03-14 DIAGNOSIS — K8020 Calculus of gallbladder without cholecystitis without obstruction: Secondary | ICD-10-CM | POA: Diagnosis not present

## 2018-03-14 DIAGNOSIS — M47896 Other spondylosis, lumbar region: Secondary | ICD-10-CM | POA: Diagnosis not present

## 2018-03-14 DIAGNOSIS — K5730 Diverticulosis of large intestine without perforation or abscess without bleeding: Secondary | ICD-10-CM | POA: Diagnosis not present

## 2018-03-14 DIAGNOSIS — M5136 Other intervertebral disc degeneration, lumbar region: Secondary | ICD-10-CM | POA: Diagnosis not present

## 2018-03-14 DIAGNOSIS — R590 Localized enlarged lymph nodes: Secondary | ICD-10-CM | POA: Diagnosis not present

## 2018-03-15 DIAGNOSIS — H25812 Combined forms of age-related cataract, left eye: Secondary | ICD-10-CM | POA: Diagnosis not present

## 2018-03-15 DIAGNOSIS — Z01818 Encounter for other preprocedural examination: Secondary | ICD-10-CM | POA: Diagnosis not present

## 2018-03-22 DIAGNOSIS — J449 Chronic obstructive pulmonary disease, unspecified: Secondary | ICD-10-CM | POA: Diagnosis not present

## 2018-03-22 DIAGNOSIS — H35033 Hypertensive retinopathy, bilateral: Secondary | ICD-10-CM | POA: Diagnosis not present

## 2018-03-22 DIAGNOSIS — I10 Essential (primary) hypertension: Secondary | ICD-10-CM | POA: Diagnosis not present

## 2018-03-22 DIAGNOSIS — I2510 Atherosclerotic heart disease of native coronary artery without angina pectoris: Secondary | ICD-10-CM | POA: Diagnosis not present

## 2018-03-22 DIAGNOSIS — G4730 Sleep apnea, unspecified: Secondary | ICD-10-CM | POA: Diagnosis not present

## 2018-03-22 DIAGNOSIS — K219 Gastro-esophageal reflux disease without esophagitis: Secondary | ICD-10-CM | POA: Diagnosis not present

## 2018-03-22 DIAGNOSIS — H259 Unspecified age-related cataract: Secondary | ICD-10-CM | POA: Diagnosis not present

## 2018-03-22 DIAGNOSIS — H25812 Combined forms of age-related cataract, left eye: Secondary | ICD-10-CM | POA: Diagnosis not present

## 2018-03-22 DIAGNOSIS — F419 Anxiety disorder, unspecified: Secondary | ICD-10-CM | POA: Diagnosis not present

## 2018-03-22 DIAGNOSIS — F329 Major depressive disorder, single episode, unspecified: Secondary | ICD-10-CM | POA: Diagnosis not present

## 2018-03-24 ENCOUNTER — Encounter: Payer: Self-pay | Admitting: Genetics

## 2018-03-28 DIAGNOSIS — C649 Malignant neoplasm of unspecified kidney, except renal pelvis: Secondary | ICD-10-CM | POA: Diagnosis not present

## 2018-03-28 DIAGNOSIS — Z6839 Body mass index (BMI) 39.0-39.9, adult: Secondary | ICD-10-CM | POA: Diagnosis not present

## 2018-03-31 DIAGNOSIS — D3001 Benign neoplasm of right kidney: Secondary | ICD-10-CM | POA: Diagnosis not present

## 2018-04-08 DIAGNOSIS — G4733 Obstructive sleep apnea (adult) (pediatric): Secondary | ICD-10-CM | POA: Diagnosis not present

## 2018-05-09 ENCOUNTER — Other Ambulatory Visit: Payer: Self-pay | Admitting: Internal Medicine

## 2018-05-09 DIAGNOSIS — G4733 Obstructive sleep apnea (adult) (pediatric): Secondary | ICD-10-CM | POA: Diagnosis not present

## 2018-05-17 DIAGNOSIS — H25811 Combined forms of age-related cataract, right eye: Secondary | ICD-10-CM | POA: Diagnosis not present

## 2018-05-17 DIAGNOSIS — Z01818 Encounter for other preprocedural examination: Secondary | ICD-10-CM | POA: Diagnosis not present

## 2018-05-24 DIAGNOSIS — G4733 Obstructive sleep apnea (adult) (pediatric): Secondary | ICD-10-CM | POA: Diagnosis not present

## 2018-05-24 DIAGNOSIS — H25811 Combined forms of age-related cataract, right eye: Secondary | ICD-10-CM | POA: Diagnosis not present

## 2018-05-24 DIAGNOSIS — Z79899 Other long term (current) drug therapy: Secondary | ICD-10-CM | POA: Diagnosis not present

## 2018-05-24 DIAGNOSIS — I10 Essential (primary) hypertension: Secondary | ICD-10-CM | POA: Diagnosis not present

## 2018-05-24 DIAGNOSIS — Z7951 Long term (current) use of inhaled steroids: Secondary | ICD-10-CM | POA: Diagnosis not present

## 2018-05-24 DIAGNOSIS — F418 Other specified anxiety disorders: Secondary | ICD-10-CM | POA: Diagnosis not present

## 2018-05-24 DIAGNOSIS — H259 Unspecified age-related cataract: Secondary | ICD-10-CM | POA: Diagnosis not present

## 2018-05-24 DIAGNOSIS — E785 Hyperlipidemia, unspecified: Secondary | ICD-10-CM | POA: Diagnosis not present

## 2018-05-24 DIAGNOSIS — K219 Gastro-esophageal reflux disease without esophagitis: Secondary | ICD-10-CM | POA: Diagnosis not present

## 2018-05-24 DIAGNOSIS — J449 Chronic obstructive pulmonary disease, unspecified: Secondary | ICD-10-CM | POA: Diagnosis not present

## 2018-05-24 DIAGNOSIS — H2511 Age-related nuclear cataract, right eye: Secondary | ICD-10-CM | POA: Diagnosis not present

## 2018-06-01 DIAGNOSIS — G4733 Obstructive sleep apnea (adult) (pediatric): Secondary | ICD-10-CM | POA: Diagnosis not present

## 2018-06-01 DIAGNOSIS — J449 Chronic obstructive pulmonary disease, unspecified: Secondary | ICD-10-CM | POA: Diagnosis not present

## 2018-06-09 DIAGNOSIS — G4733 Obstructive sleep apnea (adult) (pediatric): Secondary | ICD-10-CM | POA: Diagnosis not present

## 2018-06-15 DIAGNOSIS — G4730 Sleep apnea, unspecified: Secondary | ICD-10-CM | POA: Diagnosis not present

## 2018-06-15 DIAGNOSIS — J449 Chronic obstructive pulmonary disease, unspecified: Secondary | ICD-10-CM | POA: Diagnosis not present

## 2018-07-08 DIAGNOSIS — G4733 Obstructive sleep apnea (adult) (pediatric): Secondary | ICD-10-CM | POA: Diagnosis not present

## 2018-08-08 DIAGNOSIS — G4733 Obstructive sleep apnea (adult) (pediatric): Secondary | ICD-10-CM | POA: Diagnosis not present

## 2018-08-23 ENCOUNTER — Telehealth: Payer: Self-pay

## 2018-08-23 NOTE — Telephone Encounter (Signed)

## 2018-08-29 ENCOUNTER — Telehealth (INDEPENDENT_AMBULATORY_CARE_PROVIDER_SITE_OTHER): Payer: Medicare HMO | Admitting: Internal Medicine

## 2018-08-29 ENCOUNTER — Encounter: Payer: Self-pay | Admitting: Internal Medicine

## 2018-08-29 ENCOUNTER — Other Ambulatory Visit: Payer: Self-pay

## 2018-08-29 VITALS — BP 122/80 | HR 61 | Ht 71.0 in | Wt 262.0 lb

## 2018-08-29 DIAGNOSIS — I456 Pre-excitation syndrome: Secondary | ICD-10-CM

## 2018-08-29 DIAGNOSIS — I471 Supraventricular tachycardia: Secondary | ICD-10-CM

## 2018-08-29 DIAGNOSIS — R002 Palpitations: Secondary | ICD-10-CM

## 2018-08-29 DIAGNOSIS — Z79899 Other long term (current) drug therapy: Secondary | ICD-10-CM

## 2018-08-29 DIAGNOSIS — I10 Essential (primary) hypertension: Secondary | ICD-10-CM

## 2018-08-29 NOTE — Addendum Note (Signed)
Addended by: Dollene Primrose on: 08/29/2018 12:33 PM   Modules accepted: Orders

## 2018-08-29 NOTE — Progress Notes (Signed)
Electrophysiology TeleHealth Note   Due to national recommendations of social distancing due to COVID 19, an audio/video telehealth visit is felt to be most appropriate for this patient at this time. ,The patient did not have access to video technology/had technical difficulties with video requiring transitioning to audio format only (telephone).  All issues noted in this document were discussed and addressed.  No physical exam could be performed with this format.     See MyChart message from today for the patient's consent to telehealth for [facility].   Date:  08/29/2018   ID:  Chad Hogan, DOB Dec 17, 1940, MRN [id number]  Location: patient's home  Provider location: [address] [place]  Evaluation Performed: Follow-up visit  PCP:  Myer Peer, MD  Cardiologist:     Electrophysiologist:  SK   Chief Complaint:  SVT and variable preexcitation   History of Present Illness:    Chad Hogan is a 78 y.o. male who presents via audio/video conferencing for a telehealth visit today.  Since last being seen in our clinic for SVT with intermittent preexcitation Rx with Propafenone and verapamil  the patient reports doing very well;  He has been taking 75 bid  DOE --chronic, No chest pain, Edema is improved, No PND  Compliant with CPAP   Kidney cancer repeat imaging next month-- had quadrupled at last visit    The patient denies symptoms of fevers, chills, cough, or new SOB worrisome for COVID 19.    Past Medical History:  Diagnosis Date  . Anxiety   . CAP (community acquired pneumonia) 12/2016  . CKD (chronic kidney disease) stage 3, GFR 30-59 ml/min (HCC)   . COPD (chronic obstructive pulmonary disease) ([place])   . Diastolic CHF, chronic ([place])   . HLD (hyperlipidemia)   . HTN (hypertension)   . Ischemic heart disease   . Obesity   . OSA (obstructive sleep apnea)   . Palpitation   . Pulmonary embolism ([place])   . PVC (premature ventricular contraction)   .  SVT (supraventricular tachycardia) ([place])   . Umbilical hernia   . WPW (Wolff-Parkinson-White syndrome)     Past Surgical History:  Procedure Laterality Date  . CARDIAC CATHETERIZATION N/A 03/16/2016   Procedure: Left Heart Cath and Coronary Angiography;  Surgeon: Troy Sine, MD;  Location: [facility];  Service: Cardiovascular;  Laterality: N/A;  . COLONOSCOPY WITH PROPOFOL N/A 06/11/2014   Procedure: COLONOSCOPY WITH PROPOFOL;  Surgeon: Inda Castle, MD;  [facility];  Service: Endoscopy;  Laterality: N/A;  . ELECTROPHYSIOLOGIC STUDY N/A 03/23/2016   Procedure: Electrophysiology Study;  Surgeon: Evans Lance, MD;  Location: [facility];  Service: Cardiovascular;  Laterality: N/A;  . ELECTROPHYSIOLOGIC STUDY N/A 03/23/2016   Procedure: SVT Ablation;  Surgeon: Evans Lance, MD;  Location: [facility];  Service: Cardiovascular;  Laterality: N/A;  . ESOPHAGOGASTRODUODENOSCOPY (EGD) WITH PROPOFOL N/A 06/11/2014   Procedure: ESOPHAGOGASTRODUODENOSCOPY (EGD) WITH PROPOFOL;  Surgeon: Inda Castle, MD;  [facility];  Service: Endoscopy;  Laterality: N/A;  . HERNIA REPAIR      Current Outpatient Medications  Medication Sig Dispense Refill  . ALPRAZolam (XANAX) 1 MG tablet Take 1 tablet (1 mg total) by mouth 2 (two) times daily as needed for anxiety. 10 tablet 0  . aspirin EC 81 MG tablet Take 1 tablet (81 mg total) by mouth daily. Do not fill 90 tablet 3  . ferrous sulfate  325 (65 FE) MG tablet Take 1 tablet (325 mg total) by mouth daily. 30 tablet 0  . furosemide (LASIX) 40 MG tablet Take 1 tablet (40 mg total) by mouth 2 (two) times daily. 60 tablet 0  . hydrocortisone (ANUSOL-HC) 25 MG suppository Place 1 suppository (25 mg total) rectally 2 (two) times daily. 12 suppository 0  . Methylcobalamin (B-12) 5000 MCG TBDP Take 5,000 Units by mouth daily.    Marland Kitchen omeprazole (PRILOSEC) 20 MG capsule Take 1 capsule (20 mg total) by mouth daily. 30  capsule 0  . OVER THE COUNTER MEDICATION Inhale 1 application into the lungs at bedtime. CPAP with O2    . potassium chloride SA (K-DUR,KLOR-CON) 20 MEQ tablet Take 2 tablets (40 mEq total) by mouth daily. 60 tablet 0  . propafenone (RYTHMOL) 300 MG tablet Take 0.5 tablets (150 mg total) by mouth 2 (two) times daily. 90 tablet 3  . rosuvastatin (CRESTOR) 10 MG tablet Take 1 tablet (10 mg total) by mouth at bedtime. 30 tablet 0  . TRELEGY ELLIPTA 100-62.5-25 MCG/INH AEPB Inhale 1 puff into the lungs daily.    . verapamil (CALAN-SR) 240 MG CR tablet TAKE 1 TABLET BY MOUTH TWICE DAILY 180 tablet 3   No current facility-administered medications for this visit.     Allergies:   Eliquis [apixaban] and Codeine   Social History:  The patient  reports that he quit smoking about 22 years ago. His smokeless tobacco use includes chew. He reports that he does not drink alcohol or use drugs.   Family History:  The patient's   family history includes Heart disease in an other family member; Liver cancer in an other family member.   ROS:  Please see the history of present illness.   All other systems are personally reviewed and negative.    Exam:    Vital Signs:  BP 122/80   Pulse 61   Ht 5\' 11"  (1.803 m)   Wt 262 lb (118.8 kg)   BMI 36.54 kg/m        Labs/Other Tests and Data Reviewed:    Recent Labs: No results found for requested labs within last 8760 hours.   Wt Readings from Last 3 Encounters:  08/29/18 262 lb (118.8 kg)  02/28/18 271 lb (122.9 kg)  09/14/17 264 lb (119.7 kg)     Other studies personally reviewed: Additional studies/ records that were reviewed today include    ASSESSMENT & PLAN:    SVT with loss of preexcitation   status post incomplete ablation of left lateral accessory pathway  Oxygen dependent COPD  HFpEF  Morbid obesity  Hypertension  Kidney Cancer    Minimal sz and without palpitations-- no issues on the VERY low dose propafenone, so we will  stop it and see how he does, we can always resume  He is worried about his kidney cancer which had increased in size significantly at last imaging.  He had hoped to get a second opinion, but efforts were interrupted by COVID;  I told him if he would like one after his next imaging studies I would try and find him a name at [place] 19 screen The patient denies symptoms of COVID 19 at this time.  The importance of social distancing was discussed today.  Follow-up: 63m    Current medicines are reviewed at length with the patient today.   The patient has concerns regarding his medicines.  The following changes were  made today:   Stop propafenone  Labs/ tests ordered today include:   No orders of the defined types were placed in this encounter.   Future tests ( post COVID )    Patient Risk:  after full review of this patients clinical status, I feel that they are at moderate risk at this time.  Today, I have spent 14 minutes with the patient with telehealth technology discussing the above.  Signed, Virl Axe, MD  08/29/2018 11:21 AM     [facility] [address] [place] [address] [phone] (office) [phone] (fax)

## 2018-08-29 NOTE — Patient Instructions (Signed)
Called and discussed the following recommendations with pt; he had no additional questions.   Medication Instructions:  Your physician has recommended you make the following change in your medication:   Stop Propafenone. If you feel you need to begin this medication again, please call the office to update your medication list.   Labwork: None ordered.   Testing/Procedures: None ordered.   Follow-Up: Your physician recommends that you schedule a follow-up appointment in: 6 months with Dr Caryl Comes   Any Other Special Instructions Will Be Listed Below (If Applicable).     If you need a refill on your cardiac medications before your next appointment, please call your pharmacy.

## 2018-09-22 DIAGNOSIS — Z79899 Other long term (current) drug therapy: Secondary | ICD-10-CM | POA: Diagnosis not present

## 2018-09-22 DIAGNOSIS — I10 Essential (primary) hypertension: Secondary | ICD-10-CM | POA: Diagnosis not present

## 2018-09-22 DIAGNOSIS — R7309 Other abnormal glucose: Secondary | ICD-10-CM | POA: Diagnosis not present

## 2018-09-22 DIAGNOSIS — J449 Chronic obstructive pulmonary disease, unspecified: Secondary | ICD-10-CM | POA: Diagnosis not present

## 2018-09-22 DIAGNOSIS — I456 Pre-excitation syndrome: Secondary | ICD-10-CM | POA: Diagnosis not present

## 2018-09-22 DIAGNOSIS — E782 Mixed hyperlipidemia: Secondary | ICD-10-CM | POA: Diagnosis not present

## 2018-09-22 DIAGNOSIS — C649 Malignant neoplasm of unspecified kidney, except renal pelvis: Secondary | ICD-10-CM | POA: Diagnosis not present

## 2018-09-22 DIAGNOSIS — G250 Essential tremor: Secondary | ICD-10-CM | POA: Diagnosis not present

## 2018-09-22 DIAGNOSIS — Z125 Encounter for screening for malignant neoplasm of prostate: Secondary | ICD-10-CM | POA: Diagnosis not present

## 2018-10-03 DIAGNOSIS — I700 Atherosclerosis of aorta: Secondary | ICD-10-CM | POA: Diagnosis not present

## 2018-10-03 DIAGNOSIS — J984 Other disorders of lung: Secondary | ICD-10-CM | POA: Diagnosis not present

## 2018-10-03 DIAGNOSIS — R93421 Abnormal radiologic findings on diagnostic imaging of right kidney: Secondary | ICD-10-CM | POA: Diagnosis not present

## 2018-10-03 DIAGNOSIS — R590 Localized enlarged lymph nodes: Secondary | ICD-10-CM | POA: Diagnosis not present

## 2018-10-03 DIAGNOSIS — K8020 Calculus of gallbladder without cholecystitis without obstruction: Secondary | ICD-10-CM | POA: Diagnosis not present

## 2018-10-03 DIAGNOSIS — N2889 Other specified disorders of kidney and ureter: Secondary | ICD-10-CM | POA: Diagnosis not present

## 2018-10-03 DIAGNOSIS — D3000 Benign neoplasm of unspecified kidney: Secondary | ICD-10-CM | POA: Diagnosis not present

## 2018-10-10 DIAGNOSIS — D3001 Benign neoplasm of right kidney: Secondary | ICD-10-CM | POA: Diagnosis not present

## 2018-11-11 DIAGNOSIS — G250 Essential tremor: Secondary | ICD-10-CM | POA: Diagnosis not present

## 2018-11-11 DIAGNOSIS — J449 Chronic obstructive pulmonary disease, unspecified: Secondary | ICD-10-CM | POA: Diagnosis not present

## 2018-11-11 DIAGNOSIS — G4733 Obstructive sleep apnea (adult) (pediatric): Secondary | ICD-10-CM | POA: Diagnosis not present

## 2018-12-12 DIAGNOSIS — I10 Essential (primary) hypertension: Secondary | ICD-10-CM | POA: Diagnosis not present

## 2018-12-12 DIAGNOSIS — E782 Mixed hyperlipidemia: Secondary | ICD-10-CM | POA: Diagnosis not present

## 2018-12-22 DIAGNOSIS — C649 Malignant neoplasm of unspecified kidney, except renal pelvis: Secondary | ICD-10-CM | POA: Diagnosis not present

## 2018-12-22 DIAGNOSIS — I456 Pre-excitation syndrome: Secondary | ICD-10-CM | POA: Diagnosis not present

## 2018-12-22 DIAGNOSIS — I10 Essential (primary) hypertension: Secondary | ICD-10-CM | POA: Diagnosis not present

## 2018-12-22 DIAGNOSIS — F4320 Adjustment disorder, unspecified: Secondary | ICD-10-CM | POA: Diagnosis not present

## 2018-12-22 DIAGNOSIS — J449 Chronic obstructive pulmonary disease, unspecified: Secondary | ICD-10-CM | POA: Diagnosis not present

## 2018-12-22 DIAGNOSIS — Z9981 Dependence on supplemental oxygen: Secondary | ICD-10-CM | POA: Diagnosis not present

## 2018-12-22 DIAGNOSIS — E782 Mixed hyperlipidemia: Secondary | ICD-10-CM | POA: Diagnosis not present

## 2018-12-22 DIAGNOSIS — J9611 Chronic respiratory failure with hypoxia: Secondary | ICD-10-CM | POA: Diagnosis not present

## 2018-12-22 DIAGNOSIS — Z23 Encounter for immunization: Secondary | ICD-10-CM | POA: Diagnosis not present

## 2019-03-22 ENCOUNTER — Telehealth (INDEPENDENT_AMBULATORY_CARE_PROVIDER_SITE_OTHER): Payer: Medicare HMO | Admitting: Internal Medicine

## 2019-03-22 ENCOUNTER — Other Ambulatory Visit: Payer: Self-pay

## 2019-03-22 ENCOUNTER — Encounter: Payer: Self-pay | Admitting: Internal Medicine

## 2019-03-22 VITALS — Ht 71.0 in | Wt 172.0 lb

## 2019-03-22 DIAGNOSIS — I471 Supraventricular tachycardia: Secondary | ICD-10-CM

## 2019-03-22 DIAGNOSIS — I456 Pre-excitation syndrome: Secondary | ICD-10-CM | POA: Diagnosis not present

## 2019-03-22 DIAGNOSIS — R002 Palpitations: Secondary | ICD-10-CM | POA: Diagnosis not present

## 2019-03-22 DIAGNOSIS — I5032 Chronic diastolic (congestive) heart failure: Secondary | ICD-10-CM | POA: Diagnosis not present

## 2019-03-22 NOTE — Progress Notes (Signed)
.thf     Electrophysiology TeleHealth Note   Due to national recommendations of social distancing due to COVID 19, an audio/video telehealth visit is felt to be most appropriate for this patient at this time.  See MyChart message from today for the patient's consent to telehealth for [facility].   Date:  03/22/2019   ID:  Chad Hogan, Chad Hogan Nov 15, 1940, MRN [id number]  Location: patient's home  Provider location: [address], [place]  Evaluation Performed: Follow-up visit  PCP:  Myer Peer, MD  Cardiologist:    Electrophysiologist:  SK   Chief Complaint: SVT  History of Present Illness:    Chad Hogan is a 78 y.o. male who presents via audio/video conferencing for a telehealth visit today.  Since last being seen in our clinic for SVT managed with propafenone and verapmil the patient reports no interval tachypalpitations on now just verapamil  He says he had sent me a copy of his most recent renal MRI 6/20 which I did not ( or do not rmmbr) receiving  This showed interval growth of his tumor   He has decided not to do anything     The patient denies symptoms of fevers, chills, cough, or new SOB worrisome for COVID 19.    Past Medical History:  Diagnosis Date  . Anxiety   . CAP (community acquired pneumonia) 12/2016  . CKD (chronic kidney disease) stage 3, GFR 30-59 ml/min   . COPD (chronic obstructive pulmonary disease) ([place])   . Diastolic CHF, chronic ([place])   . HLD (hyperlipidemia)   . HTN (hypertension)   . Ischemic heart disease   . Obesity   . OSA (obstructive sleep apnea)   . Palpitation   . Pulmonary embolism ([place])   . PVC (premature ventricular contraction)   . SVT (supraventricular tachycardia) ([place])   . Umbilical hernia   . WPW (Wolff-Parkinson-White syndrome)     Past Surgical History:  Procedure Laterality Date  . CARDIAC CATHETERIZATION N/A 03/16/2016   Procedure: Left Heart Cath and Coronary Angiography;  Surgeon: Troy Sine, MD;  Location: [facility];  Service: Cardiovascular;  Laterality: N/A;  . COLONOSCOPY WITH PROPOFOL N/A 06/11/2014   Procedure: COLONOSCOPY WITH PROPOFOL;  Surgeon: Inda Castle, MD;  [facility];  Service: Endoscopy;  Laterality: N/A;  . ELECTROPHYSIOLOGIC STUDY N/A 03/23/2016   Procedure: Electrophysiology Study;  Surgeon: Evans Lance, MD;  Location: [facility];  Service: Cardiovascular;  Laterality: N/A;  . ELECTROPHYSIOLOGIC STUDY N/A 03/23/2016   Procedure: SVT Ablation;  Surgeon: Evans Lance, MD;  Location: [facility];  Service: Cardiovascular;  Laterality: N/A;  . ESOPHAGOGASTRODUODENOSCOPY (EGD) WITH PROPOFOL N/A 06/11/2014   Procedure: ESOPHAGOGASTRODUODENOSCOPY (EGD) WITH PROPOFOL;  Surgeon: Inda Castle, MD;  [facility];  Service: Endoscopy;  Laterality: N/A;  . HERNIA REPAIR      Current Outpatient Medications  Medication Sig Dispense Refill  . ALPRAZolam (XANAX) 1 MG tablet Take 1 tablet (1 mg total) by mouth 2 (two) times daily as needed for anxiety. 10 tablet 0  . aspirin EC 81 MG tablet Take 1 tablet (81 mg total) by mouth daily. Do not fill 90 tablet 3  . famotidine (PEPCID) 40 MG tablet Take 40 mg by mouth daily.    . ferrous sulfate 325 (65 FE) MG tablet Take 1 tablet (325 mg total) by mouth daily. 30 tablet 0  . furosemide (LASIX) 40 MG tablet Take  1 tablet (40 mg total) by mouth 2 (two) times daily. 60 tablet 0  . hydrocortisone (ANUSOL-HC) 25 MG suppository Place 1 suppository (25 mg total) rectally 2 (two) times daily. 12 suppository 0  . Methylcobalamin (B-12) 5000 MCG TBDP Take 5,000 Units by mouth daily.    Marland Kitchen OVER THE COUNTER MEDICATION Inhale 1 application into the lungs at bedtime. CPAP with O2    . potassium chloride SA (K-DUR,KLOR-CON) 20 MEQ tablet Take 2 tablets (40 mEq total) by mouth daily. 60 tablet 0  . rosuvastatin (CRESTOR) 10 MG tablet Take 1 tablet (10 mg total) by mouth at bedtime. 30  tablet 0  . TRELEGY ELLIPTA 100-62.5-25 MCG/INH AEPB Inhale 1 puff into the lungs daily.    . verapamil (CALAN-SR) 240 MG CR tablet TAKE 1 TABLET BY MOUTH TWICE DAILY 180 tablet 3   No current facility-administered medications for this visit.     Allergies:   Eliquis [apixaban] and Codeine   Social History:  The patient  reports that he quit smoking about 22 years ago. His smokeless tobacco use includes chew. He reports that he does not drink alcohol or use drugs.   Family History:  The patient's   family history includes Heart disease in an other family member; Liver cancer in an other family member.   ROS:  Please see the history of present illness.   All other systems are personally reviewed and negative.    Exam:    Vital Signs:  Ht 5\' 11"  (1.803 m)   Wt 172 lb (78 kg)   BMI 23.99 kg/m     Labs/Other Tests and Data Reviewed:    Recent Labs: No results found for requested labs within last 8760 hours.   Wt Readings from Last 3 Encounters:  03/22/19 172 lb (78 kg)  08/29/18 262 lb (118.8 kg)  02/28/18 271 lb (122.9 kg)     Other studies personally reviewed: Additional studies/ records that were reviewed today include:  Review of the above records today demonstrates: *     ASSESSMENT & PLAN:    SVT with loss of preexcitation status post incomplete ablation of left lateral accessory pathway  Oxygen dependent COPD  HFpEF  Morbid obesity  Hypertension  Kidney Cancer    His SVT is quiet   We discussed his kidney cancer which he has decided not to treat--nor does ha have followup wi urology  We discussed the importance of staying closely in touch    COVID 19 screen The patient denies symptoms of COVID 19 at this time.  The importance of social distancing was discussed today.  Follow-up:  80m    Current medicines are reviewed at length with the patient today.   The patient does not have concerns regarding his medicines.  The following changes were  made today:  none  Labs/ tests ordered today include:   No orders of the defined types were placed in this encounter.      Patient Risk:  after full review of this patients clinical status, I feel that they are at moderat  risk at this time.  Today, I have spent19 minutes with the patient with telehealth technology discussing the above.  Signed, Virl Axe, MD  03/22/2019 4:08 PM     [place] [address] [phone] (office) [phone] (fax)

## 2019-03-26 IMAGING — CT CT ANGIO CHEST
2 of 6 series · 18 of 36 positions shown · IV contrast (Omni 300)
Comparison: CT scan of March 13, 2016.

CLINICAL DATA: Shortness of breath.

EXAM:
CT ANGIOGRAPHY CHEST WITH CONTRAST
TECHNIQUE: Multidetector CT imaging of the chest was performed using the
standard protocol during bolus administration of intravenous
contrast. Multiplanar CT image reconstructions and MIPs were
obtained to evaluate the vascular anatomy.
CONTRAST:  100 mL of Isovue 370 intravenously.

[Series 7: pe thins · axial · 0.98mm/px · z∈[-259,-45]mm · 17 of 338 slices shown]
[im 16/338  lung]
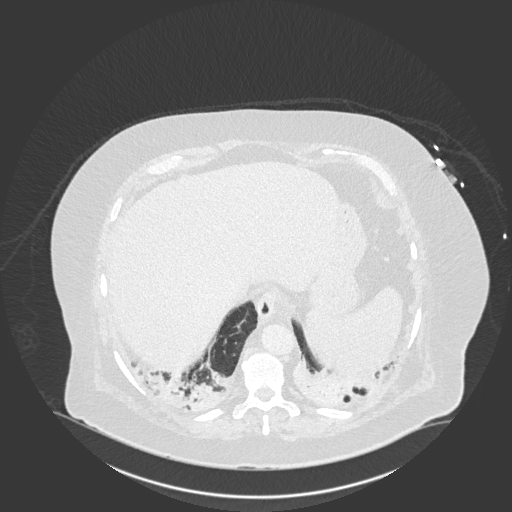
[im 31/338  mediastinal]
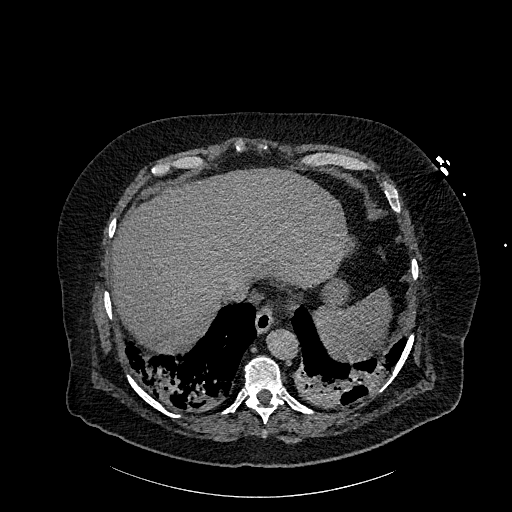
[im 62/338  lung]
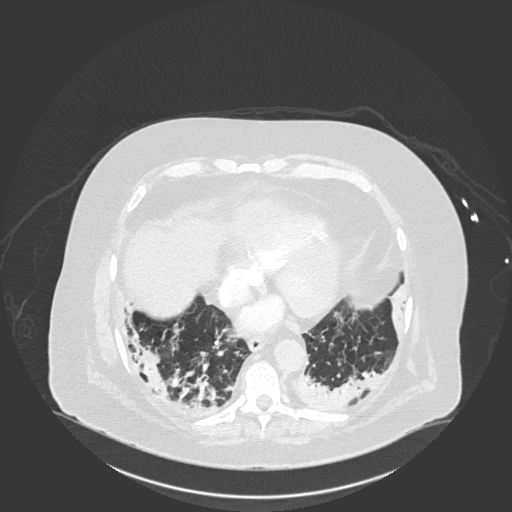
[im 77/338  mediastinal]
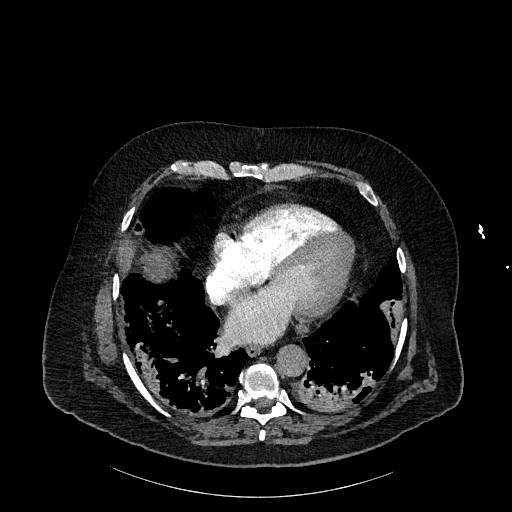
[im 92/338  lung]
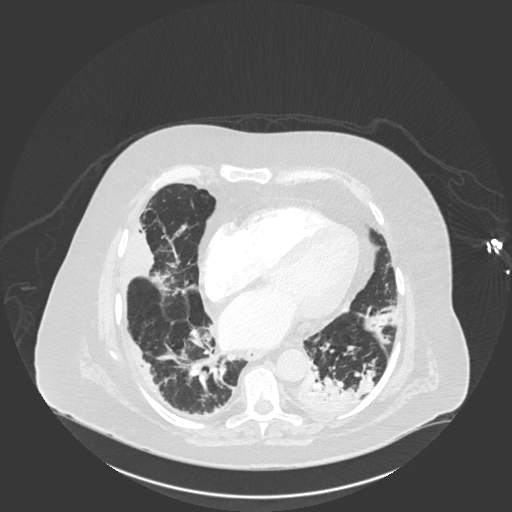
[im 108/338  mediastinal]
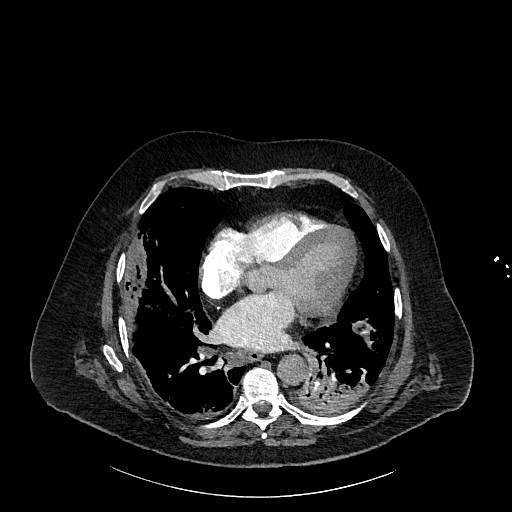
[im 138/338  lung]
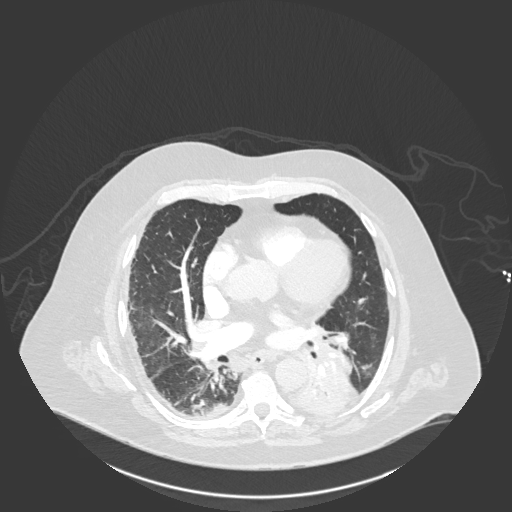
[im 154/338  mediastinal]
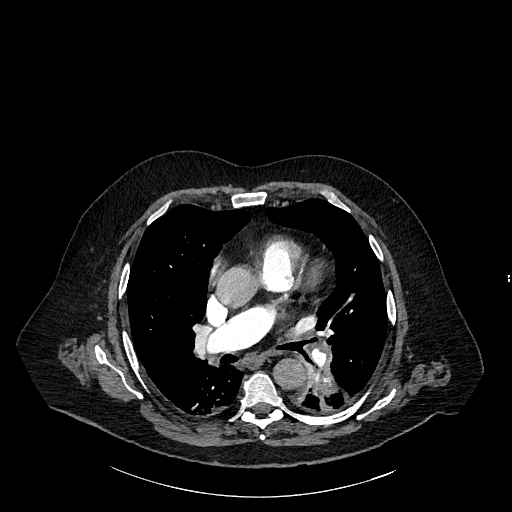
[im 169/338  lung]
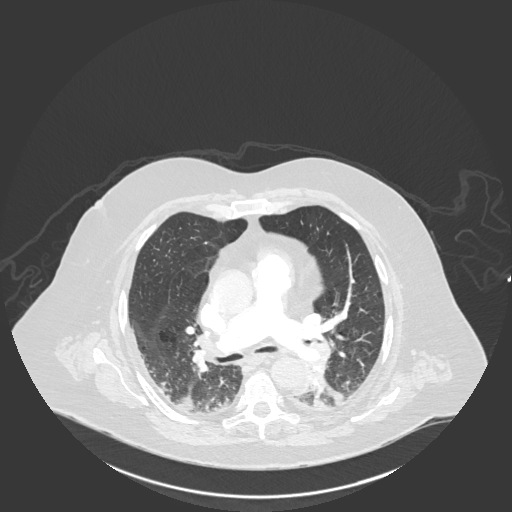
[im 184/338  mediastinal]
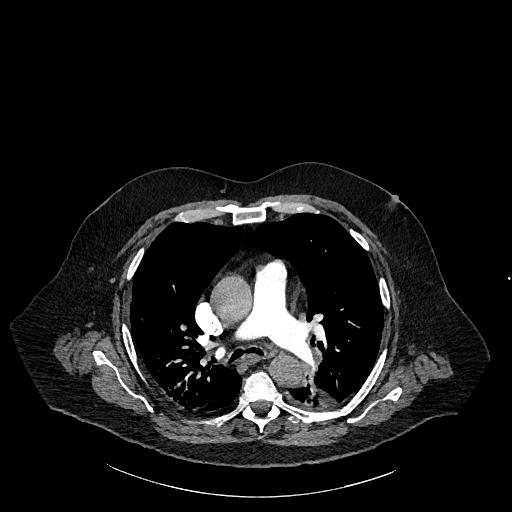
[im 200/338  lung]
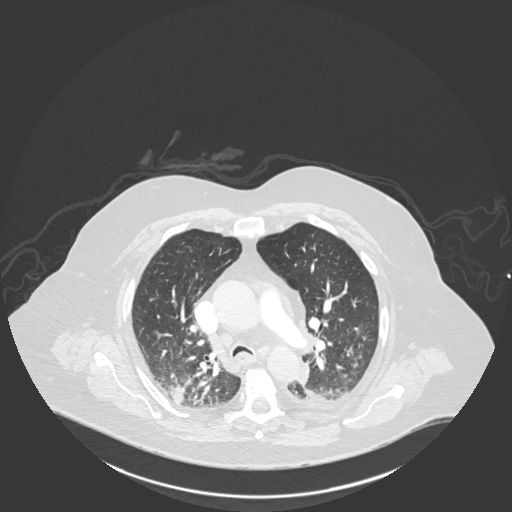
[im 230/338  mediastinal]
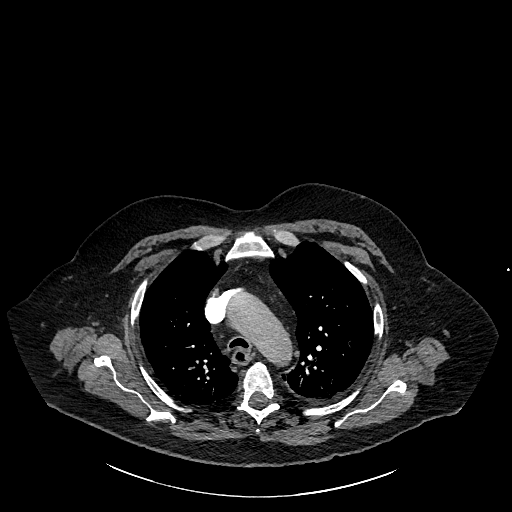
[im 246/338  lung]
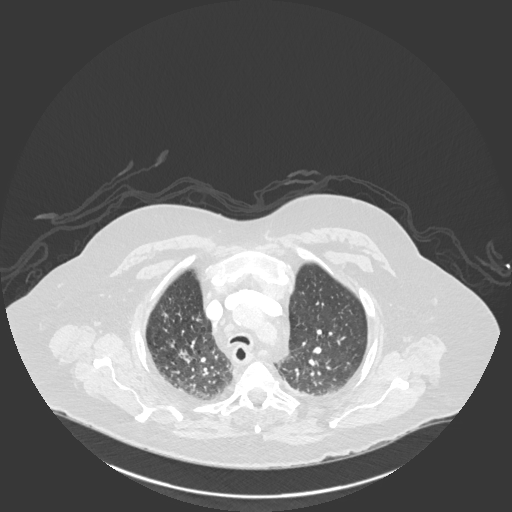
[im 261/338  mediastinal]
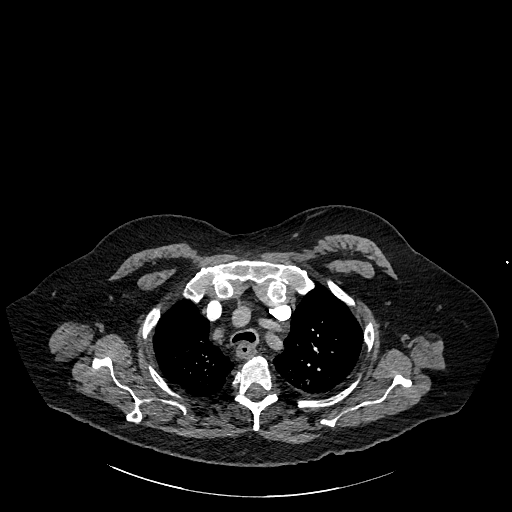
[im 276/338  lung]
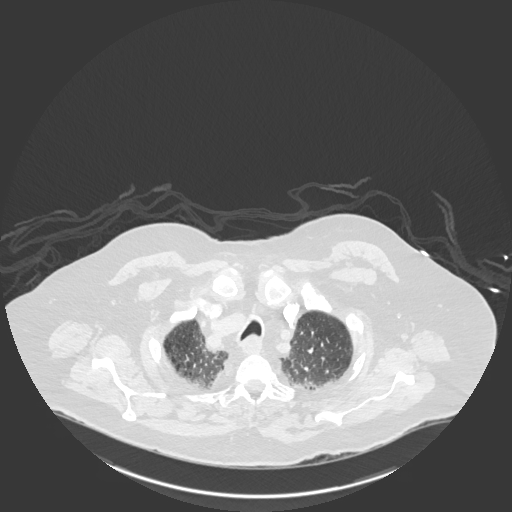
[im 307/338  mediastinal]
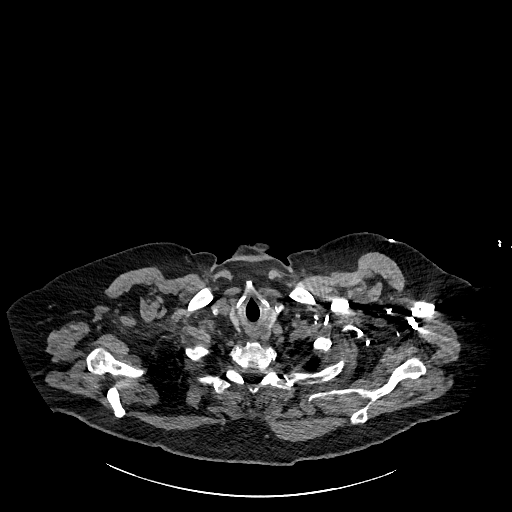
[im 322/338  lung]
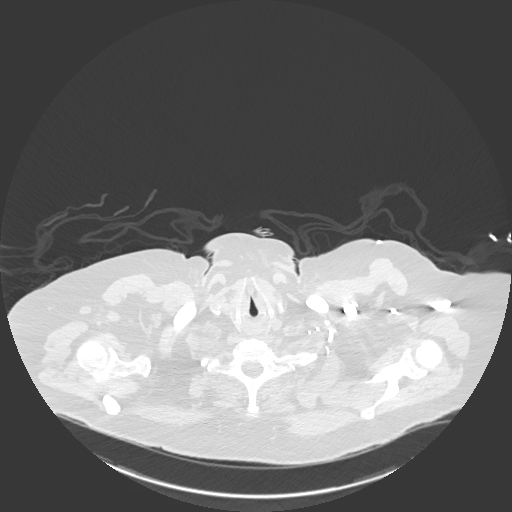

[Series 9: pe 2mm cor · coronal · 0.49mm/px · 1 of 87 slices shown]
[im 44/87  mediastinal]
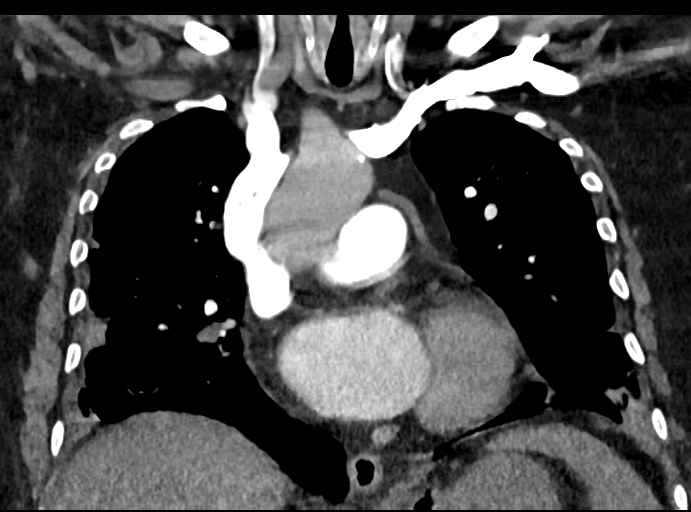

[18 of 36 positions shown; findings below may reference images not displayed]

FINDINGS: Cardiovascular: Satisfactory opacification of the pulmonary arteries
to the segmental level. No evidence of pulmonary embolism. Normal
heart size. No pericardial effusion. Atherosclerosis of thoracic
aorta is noted. 4.2 cm ascending thoracic aortic aneurysm is noted.
Mild coronary artery calcifications are noted. No pericardial
effusion is noted.

Mediastinum/Nodes: No enlarged mediastinal, hilar, or axillary lymph
nodes. Thyroid gland, trachea, and esophagus demonstrate no
significant findings.

Lungs/Pleura: No pneumothorax or significant pleural effusion is
noted. Mild bilateral posterior basilar pneumonia or atelectasis is
noted, with left greater than right. Airspace opacity is noted
laterally in the right upper lobe also concerning for pneumonia or
atelectasis.

Upper Abdomen: No acute abnormality.

Musculoskeletal: No chest wall abnormality. No acute or significant
osseous findings.

Review of the MIP images confirms the above findings.
IMPRESSION: No definite evidence of pulmonary embolus.

4.2 cm ascending thoracic aortic aneurysm. Recommend annual imaging
followup by CTA or MRA. This recommendation follows 4464
ACCF/AHA/AATS/ACR/ASA/SCA/UPATISSA/FROMM/DIMITRIO/VALCIN Guidelines for the
Diagnosis and Management of Patients with Thoracic Aortic Disease.
Circulation. 4464; 121: [id number].

Multifocal airspace opacities are noted in both lungs, particularly
in both lung bases and right upper lobe laterally. These are
concerning for pneumonia or atelectasis.

Aortic Atherosclerosis ([id number]-GA6.6).

## 2019-03-30 IMAGING — CR DG CHEST 1V PORT
1 series · 1 of 1 positions shown · non-contrast
Comparison: 12/20/2016

CLINICAL DATA: Cough and shortness of Breath

EXAM:
PORTABLE CHEST 1 VIEW

[AP]
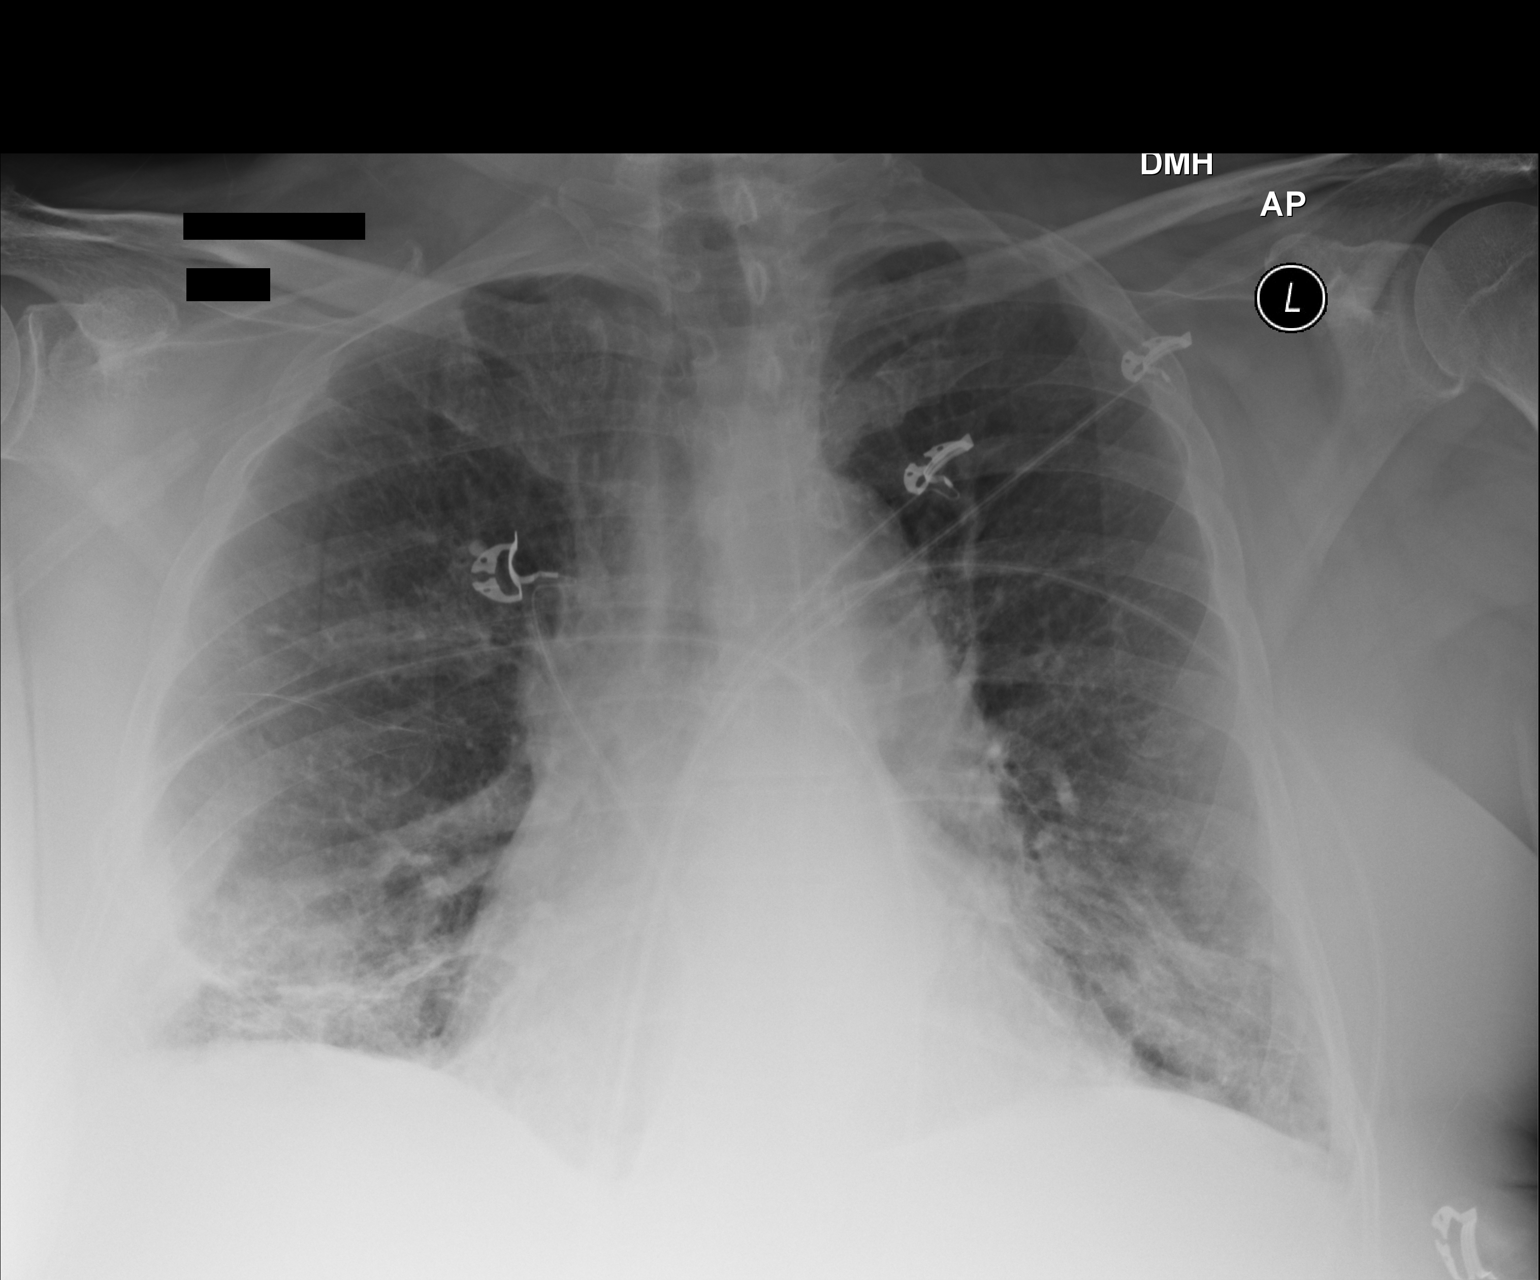

[1 of 1 positions shown; findings below may reference images not displayed]

FINDINGS: Cardiac shadow is mildly enlarged but stable. Prominence of the
thoracic aorta is again seen. Bibasilar changes are noted right
slightly greater than left similar to that seen on the prior exam.
No new focal abnormality is noted.
IMPRESSION: Bibasilar infiltrative change stable from previous CT.

## 2019-05-19 ENCOUNTER — Other Ambulatory Visit: Payer: Self-pay | Admitting: Internal Medicine

## 2019-05-19 MED ORDER — VERAPAMIL HCL ER 240 MG PO TBCR: 240.0000 mg | EXTENDED_RELEASE_TABLET | Freq: Two times a day (BID) | ORAL | 3 refills | Status: DC

## 2019-05-19 NOTE — Telephone Encounter (Signed)
Pt's medication was sent to pt's pharmacy as requested. Confirmation received.  °

## 2019-05-19 NOTE — Telephone Encounter (Signed)
*  STAT* If patient is at the pharmacy, call can be transferred to refill team.   1. Which medications need to be refilled? (please list name of each medication and dose if known)  verapamil (CALAN-SR) 240 MG CR tablet  2. Which pharmacy/location (including street and city if local pharmacy) is medication to be sent to? [id number] - [place], [place] - [address] AT [address]  3. Do they need a 30 day or 90 day supply? 90 day supply

## 2019-05-22 DIAGNOSIS — E782 Mixed hyperlipidemia: Secondary | ICD-10-CM | POA: Diagnosis not present

## 2019-05-22 DIAGNOSIS — F4320 Adjustment disorder, unspecified: Secondary | ICD-10-CM | POA: Diagnosis not present

## 2019-05-22 DIAGNOSIS — Z125 Encounter for screening for malignant neoplasm of prostate: Secondary | ICD-10-CM | POA: Diagnosis not present

## 2019-05-22 DIAGNOSIS — Z79899 Other long term (current) drug therapy: Secondary | ICD-10-CM | POA: Diagnosis not present

## 2019-05-22 DIAGNOSIS — Z0000 Encounter for general adult medical examination without abnormal findings: Secondary | ICD-10-CM | POA: Diagnosis not present

## 2019-05-22 DIAGNOSIS — R7309 Other abnormal glucose: Secondary | ICD-10-CM | POA: Diagnosis not present

## 2019-05-22 DIAGNOSIS — I10 Essential (primary) hypertension: Secondary | ICD-10-CM | POA: Diagnosis not present

## 2019-05-22 DIAGNOSIS — C649 Malignant neoplasm of unspecified kidney, except renal pelvis: Secondary | ICD-10-CM | POA: Diagnosis not present

## 2019-05-22 DIAGNOSIS — J449 Chronic obstructive pulmonary disease, unspecified: Secondary | ICD-10-CM | POA: Diagnosis not present

## 2019-08-31 DIAGNOSIS — G4733 Obstructive sleep apnea (adult) (pediatric): Secondary | ICD-10-CM | POA: Diagnosis not present

## 2019-10-09 DIAGNOSIS — I5030 Unspecified diastolic (congestive) heart failure: Secondary | ICD-10-CM | POA: Insufficient documentation

## 2019-10-12 ENCOUNTER — Other Ambulatory Visit: Payer: Self-pay

## 2019-10-12 ENCOUNTER — Telehealth: Payer: Self-pay

## 2019-10-12 ENCOUNTER — Telehealth (INDEPENDENT_AMBULATORY_CARE_PROVIDER_SITE_OTHER): Payer: Medicare HMO | Admitting: Internal Medicine

## 2019-10-12 VITALS — HR 62 | Wt 267.0 lb

## 2019-10-12 DIAGNOSIS — I471 Supraventricular tachycardia: Secondary | ICD-10-CM

## 2019-10-12 DIAGNOSIS — I5032 Chronic diastolic (congestive) heart failure: Secondary | ICD-10-CM

## 2019-10-12 NOTE — Telephone Encounter (Signed)
  Patient Consent for Virtual Visit         Chad Hogan has provided verbal consent on 10/12/2019 for a virtual visit (video or telephone).   CONSENT FOR VIRTUAL VISIT FOR:  Chad Hogan  By participating in this virtual visit I agree to the following:  I hereby voluntarily request, consent and authorize [facility] and its employed or contracted physicians, physician assistants, nurse practitioners or other licensed health care professionals (the Practitioner), to provide me with telemedicine health care services (the "Services") as deemed necessary by the treating Practitioner. I acknowledge and consent to receive the Services by the Practitioner via telemedicine. I understand that the telemedicine visit will involve communicating with the Practitioner through live audiovisual communication technology and the disclosure of certain medical information by electronic transmission. I acknowledge that I have been given the opportunity to request an in-person assessment or other available alternative prior to the telemedicine visit and am voluntarily participating in the telemedicine visit.  I understand that I have the right to withhold or withdraw my consent to the use of telemedicine in the course of my care at any time, without affecting my right to future care or treatment, and that the Practitioner or I may terminate the telemedicine visit at any time. I understand that I have the right to inspect all information obtained and/or recorded in the course of the telemedicine visit and may receive copies of available information for a reasonable fee.  I understand that some of the potential risks of receiving the Services via telemedicine include:  Marland Kitchen Delay or interruption in medical evaluation due to technological equipment failure or disruption; . Information transmitted may not be sufficient (e.g. poor resolution of images) to allow for appropriate medical decision making by the Practitioner; and/or   . In rare instances, security protocols could fail, causing a breach of personal health information.  Furthermore, I acknowledge that it is my responsibility to provide information about my medical history, conditions and care that is complete and accurate to the best of my ability. I acknowledge that Practitioner's advice, recommendations, and/or decision may be based on factors not within their control, such as incomplete or inaccurate data provided by me or distortions of diagnostic images or specimens that may result from electronic transmissions. I understand that the practice of medicine is not an exact science and that Practitioner makes no warranties or guarantees regarding treatment outcomes. I acknowledge that a copy of this consent can be made available to me via my patient portal ([facility]), or I can request a printed copy by calling the office of [facility].    I understand that my insurance will be billed for this visit.   I have read or had this consent read to me. . I understand the contents of this consent, which adequately explains the benefits and risks of the Services being provided via telemedicine.  . I have been provided ample opportunity to ask questions regarding this consent and the Services and have had my questions answered to my satisfaction. . I give my informed consent for the services to be provided through the use of telemedicine in my medical care

## 2019-10-12 NOTE — Patient Instructions (Signed)
Medication Instructions:  Your physician recommends that you continue on your current medications as directed. Please refer to the Current Medication list given to you today.  *If you need a refill on your cardiac medications before your next appointment, please call your pharmacy*   Lab Work: None ordered.  If you have labs (blood work) drawn today and your tests are completely normal, you will receive your results only by: Marland Kitchen MyChart Message (if you have MyChart) OR . A paper copy in the mail If you have any lab test that is abnormal or we need to change your treatment, we will call you to review the results.   Testing/Procedures: None ordered.    Follow-Up: At [facility], you and your health needs are our priority.  As part of our continuing mission to provide you with exceptional heart care, we have created designated Provider Care Teams.  These Care Teams include your primary Cardiologist (physician) and Advanced Practice Providers (APPs -  Physician Assistants and Nurse Practitioners) who all work together to provide you with the care you need, when you need it.  We recommend signing up for the patient portal called "MyChart".  Sign up information is provided on this After Visit Summary.  MyChart is used to connect with patients for Virtual Visits (Telemedicine).  Patients are able to view lab/test results, encounter notes, upcoming appointments, etc.  Non-urgent messages can be sent to your provider as well.   To learn more about what you can do with MyChart, go to [email or web address].    Your next appointment:  6 months with Dr Caryl Comes.  You will receive a letter to remind you to schedule.

## 2019-10-12 NOTE — Progress Notes (Signed)
.thf     Electrophysiology TeleHealth Note   Due to national recommendations of social distancing due to COVID 19, an audio/video telehealth visit is felt to be most appropriate for this patient at this time.  See MyChart message from today for the patient's consent to telehealth for [facility].   Date:  10/12/2019   ID:  AVIS MCMAHILL, DOB [birth year], MRN [id number]  Location: patient's home  Provider location: [address] [place]  Evaluation Performed: Follow-up visit  PCP:  Myer Peer, MD  Cardiologist:    Electrophysiologist:  SK   Chief Complaint: SVT  History of Present Illness:    KYZER BLOWE is a 79 y.o. male who presents via audio/video conferencing for a telehealth visit today.  Since last being seen in our clinic for SVT managed with propafenone and verapmil the patient reports no palpitations  now just verapamil  BP good  No obvious problems w renal tumor of which he is aware; He has decided not to do anything   But remains concerned  Some edema; no nocturnal dyspnea with O2 all day and CPAP at night  No chest pain    COVID vaccine    The patient denies symptoms of fevers, chills, cough, or new SOB worrisome for COVID 19.    Past Medical History:  Diagnosis Date  . Anxiety   . CAP (community acquired pneumonia) 12/2016  . CKD (chronic kidney disease) stage 3, GFR 30-59 ml/min   . COPD (chronic obstructive pulmonary disease) ([place])   . Diastolic CHF, chronic ([place])   . HLD (hyperlipidemia)   . HTN (hypertension)   . Ischemic heart disease   . Obesity   . OSA (obstructive sleep apnea)   . Palpitation   . Pulmonary embolism ([place])   . PVC (premature ventricular contraction)   . SVT (supraventricular tachycardia) ([place])   . Umbilical hernia   . WPW (Wolff-Parkinson-White syndrome)     Past Surgical History:  Procedure Laterality Date  . CARDIAC CATHETERIZATION N/A 03/16/2016   Procedure: Left Heart Cath and Coronary Angiography;   Surgeon: Troy Sine, MD;  Location: [place] [facility];  Service: Cardiovascular;  Laterality: N/A;  . COLONOSCOPY WITH PROPOFOL N/A 06/11/2014   Procedure: COLONOSCOPY WITH PROPOFOL;  Surgeon: Inda Castle, MD;  [facility];  Service: Endoscopy;  Laterality: N/A;  . ELECTROPHYSIOLOGIC STUDY N/A 03/23/2016   Procedure: Electrophysiology Study;  Surgeon: Evans Lance, MD;  Location: [place] [facility];  Service: Cardiovascular;  Laterality: N/A;  . ELECTROPHYSIOLOGIC STUDY N/A 03/23/2016   Procedure: SVT Ablation;  Surgeon: Evans Lance, MD;  Location: [place] [facility];  Service: Cardiovascular;  Laterality: N/A;  . ESOPHAGOGASTRODUODENOSCOPY (EGD) WITH PROPOFOL N/A 06/11/2014   Procedure: ESOPHAGOGASTRODUODENOSCOPY (EGD) WITH PROPOFOL;  Surgeon: Inda Castle, MD;  [facility];  Service: Endoscopy;  Laterality: N/A;  . HERNIA REPAIR      Current Outpatient Medications  Medication Sig Dispense Refill  . ALPRAZolam (XANAX) 1 MG tablet Take 1 tablet (1 mg total) by mouth 2 (two) times daily as needed for anxiety. 10 tablet 0  . aspirin EC 81 MG tablet Take 1 tablet (81 mg total) by mouth daily. Do not fill 90 tablet 3  . furosemide (LASIX) 40 MG tablet Take 1 tablet (40 mg total) by mouth 2 (two) times daily. 60 tablet 0  . OVER THE COUNTER MEDICATION Inhale 1 application into the lungs at bedtime. CPAP with O2    .  potassium chloride SA (K-DUR,KLOR-CON) 20 MEQ tablet Take 2 tablets (40 mEq total) by mouth daily. 60 tablet 0  . rosuvastatin (CRESTOR) 10 MG tablet Take 1 tablet (10 mg total) by mouth at bedtime. 30 tablet 0  . famotidine (PEPCID) 40 MG tablet Take 40 mg by mouth daily.    . ferrous sulfate 325 (65 FE) MG tablet Take 1 tablet (325 mg total) by mouth daily. (Patient not taking: Reported on 10/12/2019) 30 tablet 0  . hydrocortisone (ANUSOL-HC) 25 MG suppository Place 1 suppository (25 mg total) rectally 2 (two) times daily. 12 suppository 0  .  Methylcobalamin (B-12) 5000 MCG TBDP Take 5,000 Units by mouth daily. (Patient not taking: Reported on 10/12/2019)    . TRELEGY ELLIPTA 100-62.5-25 MCG/INH AEPB Inhale 1 puff into the lungs daily. (Patient not taking: Reported on 10/12/2019)    . verapamil (CALAN-SR) 240 MG CR tablet Take 1 tablet (240 mg total) by mouth 2 (two) times daily. 180 tablet 3   No current facility-administered medications for this visit.    Allergies:   Eliquis [apixaban] and Codeine   Social History:  The patient  reports that he quit smoking about 23 years ago. His smokeless tobacco use includes chew. He reports that he does not drink alcohol and does not use drugs.   Family History:  The patient's   family history includes Heart disease in an other family member; Liver cancer in an other family member.   ROS:  Please see the history of present illness.   All other systems are personally reviewed and negative.    Exam:    Vital Signs:  Pulse 62   Wt 267 lb (121.1 kg)   BMI 37.24 kg/m     Labs/Other Tests and Data Reviewed:    Recent Labs: No results found for requested labs within last 8760 hours.   Wt Readings from Last 3 Encounters:  10/12/19 267 lb (121.1 kg)  03/22/19 172 lb (78 kg)  08/29/18 262 lb (118.8 kg)     Other studies personally reviewed: Additional studies/ records that were reviewed today include:  Review of the above records today demonstrates: *     ASSESSMENT & PLAN:    SVT with loss of preexcitation status post incomplete ablation of left lateral accessory pathway  Oxygen dependent COPD  HFpEF  Morbid obesity  Hypertension  Kidney Cancer   His SVT quiet   Remains concerned about his kidney cancer not sure what he wants to do. Older--concerned maybe wants to review options again  Discussed options of going to [facility] or [facility]   Dyspnea stable       COVID 19 screen The patient denies symptoms of COVID 19 at this time.  The importance of social  distancing was discussed today.  Follow-up:  12m    Current medicines are reviewed at length with the patient today.   The patient has  No  concerns regarding his medicines.  The following changes were made today:  none  Labs/ tests ordered today include:   No orders of the defined types were placed in this encounter.      Patient Risk:  after full review of this patients clinical status, I feel that they are at moderat  risk at this time.  Today, I have spent  15 minutes with the patient with telehealth technology discussing the above.  Signed, Virl Axe, MD  10/12/2019 3:44 PM     [facility]  [address] [address] [phone] (office) [phone] (fax)

## 2019-12-20 DIAGNOSIS — J8410 Pulmonary fibrosis, unspecified: Secondary | ICD-10-CM | POA: Diagnosis not present

## 2019-12-20 DIAGNOSIS — Z79899 Other long term (current) drug therapy: Secondary | ICD-10-CM | POA: Diagnosis not present

## 2019-12-20 DIAGNOSIS — C649 Malignant neoplasm of unspecified kidney, except renal pelvis: Secondary | ICD-10-CM | POA: Diagnosis not present

## 2019-12-20 DIAGNOSIS — I10 Essential (primary) hypertension: Secondary | ICD-10-CM | POA: Diagnosis not present

## 2019-12-20 DIAGNOSIS — R972 Elevated prostate specific antigen [PSA]: Secondary | ICD-10-CM | POA: Diagnosis not present

## 2019-12-20 DIAGNOSIS — G250 Essential tremor: Secondary | ICD-10-CM | POA: Diagnosis not present

## 2019-12-20 DIAGNOSIS — E782 Mixed hyperlipidemia: Secondary | ICD-10-CM | POA: Diagnosis not present

## 2019-12-20 DIAGNOSIS — Z23 Encounter for immunization: Secondary | ICD-10-CM | POA: Diagnosis not present

## 2019-12-20 DIAGNOSIS — R739 Hyperglycemia, unspecified: Secondary | ICD-10-CM | POA: Diagnosis not present

## 2019-12-20 DIAGNOSIS — J449 Chronic obstructive pulmonary disease, unspecified: Secondary | ICD-10-CM | POA: Diagnosis not present

## 2020-04-18 DIAGNOSIS — L03116 Cellulitis of left lower limb: Secondary | ICD-10-CM | POA: Diagnosis not present

## 2020-04-18 DIAGNOSIS — S93602A Unspecified sprain of left foot, initial encounter: Secondary | ICD-10-CM | POA: Diagnosis not present

## 2020-04-18 DIAGNOSIS — Z6838 Body mass index (BMI) 38.0-38.9, adult: Secondary | ICD-10-CM | POA: Diagnosis not present

## 2020-04-18 DIAGNOSIS — I872 Venous insufficiency (chronic) (peripheral): Secondary | ICD-10-CM | POA: Diagnosis not present

## 2020-04-24 DIAGNOSIS — Z6838 Body mass index (BMI) 38.0-38.9, adult: Secondary | ICD-10-CM | POA: Diagnosis not present

## 2020-04-24 DIAGNOSIS — Z9981 Dependence on supplemental oxygen: Secondary | ICD-10-CM | POA: Diagnosis not present

## 2020-04-24 DIAGNOSIS — I10 Essential (primary) hypertension: Secondary | ICD-10-CM | POA: Diagnosis not present

## 2020-04-24 DIAGNOSIS — L03116 Cellulitis of left lower limb: Secondary | ICD-10-CM | POA: Diagnosis not present

## 2020-04-24 DIAGNOSIS — M7989 Other specified soft tissue disorders: Secondary | ICD-10-CM | POA: Diagnosis not present

## 2020-04-24 DIAGNOSIS — J9611 Chronic respiratory failure with hypoxia: Secondary | ICD-10-CM | POA: Diagnosis not present

## 2020-04-24 DIAGNOSIS — M79605 Pain in left leg: Secondary | ICD-10-CM | POA: Diagnosis not present

## 2020-04-25 ENCOUNTER — Other Ambulatory Visit: Payer: Self-pay

## 2020-04-25 ENCOUNTER — Telehealth (INDEPENDENT_AMBULATORY_CARE_PROVIDER_SITE_OTHER): Payer: Medicare HMO | Admitting: Internal Medicine

## 2020-04-25 ENCOUNTER — Encounter: Payer: Self-pay | Admitting: Internal Medicine

## 2020-04-25 VITALS — BP 132/78 | HR 74 | Wt 267.0 lb

## 2020-04-25 DIAGNOSIS — M79605 Pain in left leg: Secondary | ICD-10-CM | POA: Diagnosis not present

## 2020-04-25 DIAGNOSIS — I259 Chronic ischemic heart disease, unspecified: Secondary | ICD-10-CM | POA: Diagnosis not present

## 2020-04-25 DIAGNOSIS — I5032 Chronic diastolic (congestive) heart failure: Secondary | ICD-10-CM | POA: Diagnosis not present

## 2020-04-25 DIAGNOSIS — I471 Supraventricular tachycardia: Secondary | ICD-10-CM

## 2020-04-25 DIAGNOSIS — M7989 Other specified soft tissue disorders: Secondary | ICD-10-CM | POA: Diagnosis not present

## 2020-04-25 NOTE — Patient Instructions (Signed)
Medication Instructions:  Your physician recommends that you continue on your current medications as directed. Please refer to the Current Medication list given to you today.  *If you need a refill on your cardiac medications before your next appointment, please call your pharmacy*   Lab Work: none If you have labs (blood work) drawn today and your tests are completely normal, you will receive your results only by: Marland Kitchen MyChart Message (if you have MyChart) OR . A paper copy in the mail If you have any lab test that is abnormal or we need to change your treatment, we will call you to review the results.   Testing/Procedures: none   Follow-Up: At [facility], you and your health needs are our priority.  As part of our continuing mission to provide you with exceptional heart care, we have created designated Provider Care Teams.  These Care Teams include your primary Cardiologist (physician) and Advanced Practice Providers (APPs -  Physician Assistants and Nurse Practitioners) who all work together to provide you with the care you need, when you need it.  We recommend signing up for the patient portal called "MyChart".  Sign up information is provided on this After Visit Summary.  MyChart is used to connect with patients for Virtual Visits (Telemedicine).  Patients are able to view lab/test results, encounter notes, upcoming appointments, etc.  Non-urgent messages can be sent to your provider as well.   To learn more about what you can do with MyChart, go to [email or web address].    Your next appointment:   12 month(s)  The format for your next appointment:   In Person  Provider:   Dr. Virl Axe    Other Instructions

## 2020-04-25 NOTE — Progress Notes (Signed)
.thf     Electrophysiology TeleHealth Note   Due to national recommendations of social distancing due to COVID 19, an audio/video telehealth visit is felt to be most appropriate for this patient at this time.  See MyChart message from today for the patient's consent to telehealth for [facility].   Date:  04/25/2020   ID:  Chad Hogan, DOB Jan 27, 1941, MRN [id number]  Location: patient's home  Provider location: [address], [place]  Evaluation Performed: Follow-up visit  PCP:  Myer Peer, MD  Cardiologist:    Electrophysiologist:  SK   Chief Complaint: SVT  History of Present Illness:    Chad Hogan is a 80 y.o. male who presents via audio/video conferencing for a telehealth visit today.  Since last being seen in our clinic for SVT with WPW and loss of preexcitation managed withpreviously with  Propafenone but now just verapamil the patient reports   no palpitations  now just verapamil    NO current kidney issues just following  Breathing stable on chronic 3 L  Without changes  Some swelling  On the L foot, following a fall face forward >> not broken. Bruised >> scan no clot   No chest pain   His nephew married Equities trader (RN CCU)      COVID vaccine + booster   The patient denies symptoms of fevers, chills, cough, or new SOB worrisome for COVID 19.    Past Medical History:  Diagnosis Date  . Anxiety   . CAP (community acquired pneumonia) 12/2016  . CKD (chronic kidney disease) stage 3, GFR 30-59 ml/min (H[facility])   . COPD (chronic obstructive pulmonary disease) ([place])   . Diastolic CHF, chronic ([place])   . HLD (hyperlipidemia)   . HTN (hypertension)   . Ischemic heart disease   . Obesity   . OSA (obstructive sleep apnea)   . Palpitation   . Pulmonary embolism ([place])   . PVC (premature ventricular contraction)   . SVT (supraventricular tachycardia) ([place])   . Umbilical hernia   . WPW (Wolff-Parkinson-White syndrome)     Past Surgical  History:  Procedure Laterality Date  . CARDIAC CATHETERIZATION N/A 03/16/2016   Procedure: Left Heart Cath and Coronary Angiography;  Surgeon: Troy Sine, MD;  Location: [place] [facility];  Service: Cardiovascular;  Laterality: N/A;  . COLONOSCOPY WITH PROPOFOL N/A 06/11/2014   Procedure: COLONOSCOPY WITH PROPOFOL;  Surgeon: Inda Castle, MD;  [facility];  Service: Endoscopy;  Laterality: N/A;  . ELECTROPHYSIOLOGIC STUDY N/A 03/23/2016   Procedure: Electrophysiology Study;  Surgeon: Evans Lance, MD;  Location: [place] [facility];  Service: Cardiovascular;  Laterality: N/A;  . ELECTROPHYSIOLOGIC STUDY N/A 03/23/2016   Procedure: SVT Ablation;  Surgeon: Evans Lance, MD;  Location: [place] [facility];  Service: Cardiovascular;  Laterality: N/A;  . ESOPHAGOGASTRODUODENOSCOPY (EGD) WITH PROPOFOL N/A 06/11/2014   Procedure: ESOPHAGOGASTRODUODENOSCOPY (EGD) WITH PROPOFOL;  Surgeon: Inda Castle, MD;  [facility];  Service: Endoscopy;  Laterality: N/A;  . HERNIA REPAIR      Current Outpatient Medications  Medication Sig Dispense Refill  . ALPRAZolam (XANAX) 1 MG tablet Take 1 tablet (1 mg total) by mouth 2 (two) times daily as needed for anxiety. 10 tablet 0  . aspirin EC 81 MG tablet Take 1 tablet (81 mg total) by mouth daily. Do not fill 90 tablet 3  . famotidine (PEPCID) 40 MG tablet Take 40 mg by mouth daily.    Marland Kitchen  furosemide (LASIX) 40 MG tablet Take 1 tablet (40 mg total) by mouth 2 (two) times daily. 60 tablet 0  . hydrocortisone (ANUSOL-HC) 25 MG suppository Place 1 suppository (25 mg total) rectally 2 (two) times daily. 12 suppository 0  . Methylcobalamin (B-12) 5000 MCG TBDP Take 5,000 Units by mouth daily.    Marland Kitchen OVER THE COUNTER MEDICATION Inhale 1 application into the lungs at bedtime. CPAP with O2    . potassium chloride SA (K-DUR,KLOR-CON) 20 MEQ tablet Take 2 tablets (40 mEq total) by mouth daily. 60 tablet 0  . rosuvastatin (CRESTOR) 10 MG tablet  Take 1 tablet (10 mg total) by mouth at bedtime. 30 tablet 0  . TRELEGY ELLIPTA 100-62.5-25 MCG/INH AEPB Inhale 1 puff into the lungs daily.    . verapamil (CALAN-SR) 240 MG CR tablet Take 1 tablet (240 mg total) by mouth 2 (two) times daily. 180 tablet 3   No current facility-administered medications for this visit.    Allergies:   Eliquis [apixaban] and Codeine   Social History:  The patient  reports that he quit smoking about 24 years ago. His smokeless tobacco use includes chew. He reports that he does not drink alcohol and does not use drugs.   Family History:  The patient's   family history includes Heart disease in an other family member; Liver cancer in an other family member.   ROS:  Please see the history of present illness.   All other systems are personally reviewed and negative.    Exam:    Vital Signs:  Wt 267 lb (121.1 kg)   BMI 37.24 kg/m     Labs/Other Tests and Data Reviewed:    Recent Labs: No results found for requested labs within last 8760 hours.   Wt Readings from Last 3 Encounters:  04/25/20 267 lb (121.1 kg)  10/12/19 267 lb (121.1 kg)  03/22/19 172 lb (78 kg)     Other studies personally reviewed: Additional studies/ records that were reviewed today include:  Review of the above records today demonstrates: *     ASSESSMENT & PLAN:    SVT with loss of preexcitation status post incomplete ablation of left lateral accessory pathway  Oxygen dependent COPD  Stable 3L  HFpEF  Morbid obesity  Hypertension  Kidney Cancer     NO interval tachycardia--continue verapamil  BP well controlled  Some asymmetric edema following a fall  Dyspnea stable on O2         COVID 19 screen The patient denies symptoms of COVID 19 at this time.  The importance of social distancing was discussed today.  Follow-up:  53m     Current medicines are reviewed at length with the patient today.   The patient does not have concerns regarding his  medicines.  The following changes were made today:  none  Labs/ tests ordered today include:   No orders of the defined types were placed in this encounter.   Future tests ( post COVID )    Patient Risk:  after full review of this patients clinical status, I feel that they are at moderate * risk at this time.  Today, I have spent 15 minutes with the patient with telehealth technology discussing the above.  Signed, Virl Axe, MD  04/25/2020 1:48 PM     [place] [facility] [address] [phone] (office) [phone] (fax)

## 2020-05-06 DIAGNOSIS — I10 Essential (primary) hypertension: Secondary | ICD-10-CM | POA: Diagnosis not present

## 2020-05-06 DIAGNOSIS — Z9981 Dependence on supplemental oxygen: Secondary | ICD-10-CM | POA: Diagnosis not present

## 2020-05-06 DIAGNOSIS — J9611 Chronic respiratory failure with hypoxia: Secondary | ICD-10-CM | POA: Diagnosis not present

## 2020-05-06 DIAGNOSIS — C649 Malignant neoplasm of unspecified kidney, except renal pelvis: Secondary | ICD-10-CM | POA: Diagnosis not present

## 2020-05-06 DIAGNOSIS — M25562 Pain in left knee: Secondary | ICD-10-CM | POA: Diagnosis not present

## 2020-05-06 DIAGNOSIS — J8410 Pulmonary fibrosis, unspecified: Secondary | ICD-10-CM | POA: Diagnosis not present

## 2020-05-06 DIAGNOSIS — J982 Interstitial emphysema: Secondary | ICD-10-CM | POA: Diagnosis not present

## 2020-05-06 DIAGNOSIS — M1712 Unilateral primary osteoarthritis, left knee: Secondary | ICD-10-CM | POA: Diagnosis not present

## 2020-05-06 DIAGNOSIS — M62838 Other muscle spasm: Secondary | ICD-10-CM | POA: Diagnosis not present

## 2020-05-30 ENCOUNTER — Other Ambulatory Visit: Payer: Self-pay

## 2020-05-30 DIAGNOSIS — J8410 Pulmonary fibrosis, unspecified: Secondary | ICD-10-CM | POA: Diagnosis not present

## 2020-05-30 DIAGNOSIS — Z79899 Other long term (current) drug therapy: Secondary | ICD-10-CM | POA: Diagnosis not present

## 2020-05-30 DIAGNOSIS — E782 Mixed hyperlipidemia: Secondary | ICD-10-CM | POA: Diagnosis not present

## 2020-05-30 DIAGNOSIS — Z9981 Dependence on supplemental oxygen: Secondary | ICD-10-CM | POA: Diagnosis not present

## 2020-05-30 DIAGNOSIS — I10 Essential (primary) hypertension: Secondary | ICD-10-CM | POA: Diagnosis not present

## 2020-05-30 DIAGNOSIS — R972 Elevated prostate specific antigen [PSA]: Secondary | ICD-10-CM | POA: Diagnosis not present

## 2020-05-30 DIAGNOSIS — R7302 Impaired glucose tolerance (oral): Secondary | ICD-10-CM | POA: Diagnosis not present

## 2020-05-30 DIAGNOSIS — J9611 Chronic respiratory failure with hypoxia: Secondary | ICD-10-CM | POA: Diagnosis not present

## 2020-05-30 DIAGNOSIS — J982 Interstitial emphysema: Secondary | ICD-10-CM | POA: Diagnosis not present

## 2020-05-30 MED ORDER — VERAPAMIL HCL ER 240 MG PO TBCR: 240.0000 mg | EXTENDED_RELEASE_TABLET | Freq: Two times a day (BID) | ORAL | 3 refills | Status: DC

## 2020-05-30 NOTE — Telephone Encounter (Signed)
Pt's medication was sent to pt's pharmacy as requested. Confirmation received.  °

## 2020-09-23 DIAGNOSIS — J449 Chronic obstructive pulmonary disease, unspecified: Secondary | ICD-10-CM | POA: Diagnosis not present

## 2020-10-23 DIAGNOSIS — J449 Chronic obstructive pulmonary disease, unspecified: Secondary | ICD-10-CM | POA: Diagnosis not present

## 2020-11-23 DIAGNOSIS — J449 Chronic obstructive pulmonary disease, unspecified: Secondary | ICD-10-CM | POA: Diagnosis not present

## 2020-11-27 DIAGNOSIS — E782 Mixed hyperlipidemia: Secondary | ICD-10-CM | POA: Diagnosis not present

## 2020-11-27 DIAGNOSIS — J8410 Pulmonary fibrosis, unspecified: Secondary | ICD-10-CM | POA: Diagnosis not present

## 2020-11-27 DIAGNOSIS — Z125 Encounter for screening for malignant neoplasm of prostate: Secondary | ICD-10-CM | POA: Diagnosis not present

## 2020-11-27 DIAGNOSIS — J9611 Chronic respiratory failure with hypoxia: Secondary | ICD-10-CM | POA: Diagnosis not present

## 2020-11-27 DIAGNOSIS — R7301 Impaired fasting glucose: Secondary | ICD-10-CM | POA: Diagnosis not present

## 2020-11-27 DIAGNOSIS — I471 Supraventricular tachycardia: Secondary | ICD-10-CM | POA: Diagnosis not present

## 2020-11-27 DIAGNOSIS — G250 Essential tremor: Secondary | ICD-10-CM | POA: Diagnosis not present

## 2020-11-27 DIAGNOSIS — Z9981 Dependence on supplemental oxygen: Secondary | ICD-10-CM | POA: Diagnosis not present

## 2020-11-27 DIAGNOSIS — J982 Interstitial emphysema: Secondary | ICD-10-CM | POA: Diagnosis not present

## 2020-11-28 ENCOUNTER — Telehealth: Payer: Self-pay | Admitting: *Deleted

## 2020-11-28 NOTE — Telephone Encounter (Signed)
Pt called re PCP starting Propanolol 60 mg qd for tremor in the hands and pt wanting to know if could take since is taking Verapamil 240 mg bid Discussed with [facility] and pt will probably need to decrease verapamil as HR is running in the 60's.Discussed with Dr Caryl Comes and Dr Caryl Comes wants to decrease Verapamil to 240 mg daily . Called pt and he has concerns about decreasing the Verapamil so rather than possibly having an unwanted episode will continue as is and will not start the Propanolol./cy

## 2020-12-24 DIAGNOSIS — J449 Chronic obstructive pulmonary disease, unspecified: Secondary | ICD-10-CM | POA: Diagnosis not present

## 2021-01-07 DIAGNOSIS — Z23 Encounter for immunization: Secondary | ICD-10-CM | POA: Diagnosis not present

## 2021-01-15 ENCOUNTER — Telehealth: Payer: Self-pay | Admitting: Internal Medicine

## 2021-01-15 NOTE — Telephone Encounter (Signed)
Pt states Dr. Caryl Comes calls him every 6 months to follow up.. pt is wanting Dr. Caryl Comes to call him rather than him having to come in office... please advise

## 2021-01-22 ENCOUNTER — Encounter: Payer: Self-pay | Admitting: Internal Medicine

## 2021-01-23 DIAGNOSIS — J449 Chronic obstructive pulmonary disease, unspecified: Secondary | ICD-10-CM | POA: Diagnosis not present

## 2021-02-23 DIAGNOSIS — J449 Chronic obstructive pulmonary disease, unspecified: Secondary | ICD-10-CM | POA: Diagnosis not present

## 2021-04-24 ENCOUNTER — Ambulatory Visit (INDEPENDENT_AMBULATORY_CARE_PROVIDER_SITE_OTHER): Payer: Medicare HMO | Admitting: Internal Medicine

## 2021-04-24 DIAGNOSIS — I259 Chronic ischemic heart disease, unspecified: Secondary | ICD-10-CM

## 2021-04-24 DIAGNOSIS — I5032 Chronic diastolic (congestive) heart failure: Secondary | ICD-10-CM | POA: Diagnosis not present

## 2021-04-24 DIAGNOSIS — I456 Pre-excitation syndrome: Secondary | ICD-10-CM

## 2021-04-24 DIAGNOSIS — I471 Supraventricular tachycardia: Secondary | ICD-10-CM

## 2021-04-24 NOTE — Progress Notes (Signed)
.thf     Electrophysiology TeleHealth Note   Due to national recommendations of social distancing due to COVID 19, an audio/video telehealth visit is felt to be most appropriate for this patient at this time.  See MyChart message from today for the patient's consent to telehealth for [facility].   Date:  04/24/2021   ID:  Chad Hogan, DOB 1940/07/08, MRN [id number]  Location: patient's home  Provider location: [address], [place]  Evaluation Performed: Follow-up visit  PCP:  Myer Peer, MD  Cardiologist:    Electrophysiologist:  SK   Chief Complaint: SVT  History of Present Illness:    Chad Hogan is a 81 y.o. male who presents via audio/video conferencing for a telehealth visit today.  Since last being seen in our clinic for SVT with WPW and loss of preexcitation managed with previously with  Propafenone but now just verapamil the patient reports no palpitations x 5 year-- still taking verapamil bid  Using O2 3L without changes  Big problem with tremors Rx was intolerant -- tried on baclofen        His nephew married Equities trader (RN CCU)      COVID vaccine + booster   The patient denies symptoms of fevers, chills, cough, or new SOB worrisome for COVID 19.    Past Medical History:  Diagnosis Date   Anxiety    CAP (community acquired pneumonia) 12/2016   CKD (chronic kidney disease) stage 3, GFR 30-59 ml/min ([facility])    COPD (chronic obstructive pulmonary disease) ([facility])    Diastolic CHF, chronic (HCC)    HLD (hyperlipidemia)    HTN (hypertension)    Ischemic heart disease    Obesity    OSA (obstructive sleep apnea)    Palpitation    Pulmonary embolism ([facility])    PVC (premature ventricular contraction)    SVT (supraventricular tachycardia) ([facility])    Umbilical hernia    WPW (Wolff-Parkinson-White syndrome)     Past Surgical History:  Procedure Laterality Date   CARDIAC CATHETERIZATION N/A 03/16/2016   Procedure: Left Heart Cath  and Coronary Angiography;  Surgeon: Troy Sine, MD;  Location: [place] [facility];  Service: Cardiovascular;  Laterality: N/A;   COLONOSCOPY WITH PROPOFOL N/A 06/11/2014   Procedure: COLONOSCOPY WITH PROPOFOL;  Surgeon: Inda Castle, MD;  [facility];  Service: Endoscopy;  Laterality: N/A;   ELECTROPHYSIOLOGIC STUDY N/A 03/23/2016   Procedure: Electrophysiology Study;  Surgeon: Evans Lance, MD;  Location: [place] [facility];  Service: Cardiovascular;  Laterality: N/A;   ELECTROPHYSIOLOGIC STUDY N/A 03/23/2016   Procedure: SVT Ablation;  Surgeon: Evans Lance, MD;  Location: [place] [facility];  Service: Cardiovascular;  Laterality: N/A;   ESOPHAGOGASTRODUODENOSCOPY (EGD) WITH PROPOFOL N/A 06/11/2014   Procedure: ESOPHAGOGASTRODUODENOSCOPY (EGD) WITH PROPOFOL;  Surgeon: Inda Castle, MD;  [facility];  Service: Endoscopy;  Laterality: N/A;   HERNIA REPAIR      Current Outpatient Medications  Medication Sig Dispense Refill   ALPRAZolam (XANAX) 1 MG tablet Take 1 tablet (1 mg total) by mouth 2 (two) times daily as needed for anxiety. 10 tablet 0   aspirin EC 81 MG tablet Take 1 tablet (81 mg total) by mouth daily. Do not fill 90 tablet 3   famotidine (PEPCID) 40 MG tablet Take 40 mg by mouth daily.     furosemide (LASIX) 40 MG tablet Take 1 tablet (40 mg total) by mouth 2 (two) times daily.  60 tablet 0   hydrocortisone (ANUSOL-HC) 25 MG suppository Place 1 suppository (25 mg total) rectally 2 (two) times daily. 12 suppository 0   Methylcobalamin (B-12) 5000 MCG TBDP Take 5,000 Units by mouth daily.     OVER THE COUNTER MEDICATION Inhale 1 application into the lungs at bedtime. CPAP with O2     potassium chloride SA (K-DUR,KLOR-CON) 20 MEQ tablet Take 2 tablets (40 mEq total) by mouth daily. 60 tablet 0   rosuvastatin (CRESTOR) 10 MG tablet Take 1 tablet (10 mg total) by mouth at bedtime. 30 tablet 0   TRELEGY ELLIPTA 100-62.5-25 MCG/INH AEPB Inhale 1 puff into  the lungs daily.     verapamil (CALAN-SR) 240 MG CR tablet Take 1 tablet (240 mg total) by mouth 2 (two) times daily. 180 tablet 3   No current facility-administered medications for this visit.    Allergies:   Eliquis [apixaban] and Codeine      Exam:    Vital Signs:  There were no vitals taken for this visit.    Labs/Other Tests and Data Reviewed:            ASSESSMENT & PLAN:    SVT with loss of preexcitation   status post incomplete ablation of left lateral accessory pathway   Oxygen dependent COPD  Stable 3L   HFpEF   Morbid obesity   Hypertension  Tremor    No interval tachycardia continue verapamil  With tremor, suggested he contact PCP about a betablocker  BP well controlled 130s  continue verapamil  NO edema  continue furosemide 40 bid   Blood work pending          COVID 19 screen The patient denies symptoms of COVID 19 at this time.  The importance of social distancing was discussed today.  Follow-up:  12 m OV     Current medicines are reviewed at length with the patient today.   The patient  concerns regarding his medicines.  The following changes were made today:    Labs/ tests ordered today include:  No orders of the defined types were placed in this encounter.   Future tests ( post COVID )     Patient Risk:  after full review of this patients clinical status, I feel that they are at moderate  risk at this time.  Today, I have spent 5 minutes with the patient with telehealth technology discussing the above.   Signed, Virl Axe, MD  04/24/2021 5:58 PM     [facility] [address] [place] [address] [phone] (office) [phone] (fax)

## 2021-05-05 DIAGNOSIS — C649 Malignant neoplasm of unspecified kidney, except renal pelvis: Secondary | ICD-10-CM | POA: Diagnosis not present

## 2021-05-05 DIAGNOSIS — G4733 Obstructive sleep apnea (adult) (pediatric): Secondary | ICD-10-CM | POA: Diagnosis not present

## 2021-05-05 DIAGNOSIS — F4320 Adjustment disorder, unspecified: Secondary | ICD-10-CM | POA: Diagnosis not present

## 2021-05-05 DIAGNOSIS — J8410 Pulmonary fibrosis, unspecified: Secondary | ICD-10-CM | POA: Diagnosis not present

## 2021-05-05 DIAGNOSIS — Z9981 Dependence on supplemental oxygen: Secondary | ICD-10-CM | POA: Diagnosis not present

## 2021-05-05 DIAGNOSIS — I10 Essential (primary) hypertension: Secondary | ICD-10-CM | POA: Diagnosis not present

## 2021-05-05 DIAGNOSIS — J982 Interstitial emphysema: Secondary | ICD-10-CM | POA: Diagnosis not present

## 2021-05-05 DIAGNOSIS — J9611 Chronic respiratory failure with hypoxia: Secondary | ICD-10-CM | POA: Diagnosis not present

## 2021-05-05 DIAGNOSIS — E782 Mixed hyperlipidemia: Secondary | ICD-10-CM | POA: Diagnosis not present

## 2021-05-05 DIAGNOSIS — G250 Essential tremor: Secondary | ICD-10-CM | POA: Diagnosis not present

## 2021-05-05 DIAGNOSIS — Z9989 Dependence on other enabling machines and devices: Secondary | ICD-10-CM | POA: Diagnosis not present

## 2021-05-05 DIAGNOSIS — I471 Supraventricular tachycardia: Secondary | ICD-10-CM | POA: Diagnosis not present

## 2021-05-08 ENCOUNTER — Ambulatory Visit: Payer: Medicare HMO | Admitting: Internal Medicine

## 2021-06-23 ENCOUNTER — Other Ambulatory Visit: Payer: Self-pay | Admitting: *Deleted

## 2021-06-23 MED ORDER — VERAPAMIL HCL ER 240 MG PO TBCR: 240.0000 mg | EXTENDED_RELEASE_TABLET | Freq: Two times a day (BID) | ORAL | 3 refills | Status: DC

## 2021-07-02 DIAGNOSIS — R972 Elevated prostate specific antigen [PSA]: Secondary | ICD-10-CM | POA: Diagnosis not present

## 2021-07-02 DIAGNOSIS — E782 Mixed hyperlipidemia: Secondary | ICD-10-CM | POA: Diagnosis not present

## 2021-07-02 DIAGNOSIS — Z9989 Dependence on other enabling machines and devices: Secondary | ICD-10-CM | POA: Diagnosis not present

## 2021-07-02 DIAGNOSIS — I456 Pre-excitation syndrome: Secondary | ICD-10-CM | POA: Diagnosis not present

## 2021-07-02 DIAGNOSIS — F4320 Adjustment disorder, unspecified: Secondary | ICD-10-CM | POA: Diagnosis not present

## 2021-07-02 DIAGNOSIS — I10 Essential (primary) hypertension: Secondary | ICD-10-CM | POA: Diagnosis not present

## 2021-07-02 DIAGNOSIS — R7301 Impaired fasting glucose: Secondary | ICD-10-CM | POA: Diagnosis not present

## 2021-07-02 DIAGNOSIS — G250 Essential tremor: Secondary | ICD-10-CM | POA: Diagnosis not present

## 2021-07-02 DIAGNOSIS — G4733 Obstructive sleep apnea (adult) (pediatric): Secondary | ICD-10-CM | POA: Diagnosis not present

## 2021-08-04 DIAGNOSIS — Z9989 Dependence on other enabling machines and devices: Secondary | ICD-10-CM | POA: Diagnosis not present

## 2021-08-04 DIAGNOSIS — G250 Essential tremor: Secondary | ICD-10-CM | POA: Diagnosis not present

## 2021-08-04 DIAGNOSIS — I10 Essential (primary) hypertension: Secondary | ICD-10-CM | POA: Diagnosis not present

## 2021-08-04 DIAGNOSIS — I471 Supraventricular tachycardia: Secondary | ICD-10-CM | POA: Diagnosis not present

## 2021-08-04 DIAGNOSIS — J8410 Pulmonary fibrosis, unspecified: Secondary | ICD-10-CM | POA: Diagnosis not present

## 2021-08-04 DIAGNOSIS — J982 Interstitial emphysema: Secondary | ICD-10-CM | POA: Diagnosis not present

## 2021-08-04 DIAGNOSIS — G4733 Obstructive sleep apnea (adult) (pediatric): Secondary | ICD-10-CM | POA: Diagnosis not present

## 2021-08-04 DIAGNOSIS — F4320 Adjustment disorder, unspecified: Secondary | ICD-10-CM | POA: Diagnosis not present

## 2021-08-04 DIAGNOSIS — I456 Pre-excitation syndrome: Secondary | ICD-10-CM | POA: Diagnosis not present

## 2021-10-27 ENCOUNTER — Ambulatory Visit: Payer: Medicare HMO | Admitting: Internal Medicine

## 2021-10-27 ENCOUNTER — Encounter: Payer: Self-pay | Admitting: Internal Medicine

## 2021-10-27 VITALS — BP 114/62 | HR 62 | Ht 71.0 in | Wt 246.0 lb

## 2021-10-27 DIAGNOSIS — I5032 Chronic diastolic (congestive) heart failure: Secondary | ICD-10-CM

## 2021-10-27 DIAGNOSIS — I471 Supraventricular tachycardia: Secondary | ICD-10-CM | POA: Diagnosis not present

## 2021-10-27 NOTE — Progress Notes (Signed)
Patient Care Team: Street, Sharon Mt, MD as PCP - General (Family Medicine)   HPI  Chad Hogan is a 81 y.o. male Seen in followup for SVT  he has had no significant palpitations. He has a remote history of ventricular preexcitation, now with oxygen dependent COPD.    He underwent a catheter-based procedure with diagnosis of a left lateral accessory pathway which was only partially modified by ablation.  He was started on propafenone.  Because of ongoing problems with tachypalpitations, he was started on carvedilol with up titration from 12--25 without improvement.  He saw AS-NP 12/18  put him back on verapamil.  No recurrent palpitations  He is also had an intercurrent hospitalization for congestive heart failure with a 30 pound diuresis   9/18 echocardiogram EF 60-65%   He was diagnosed wit pulmonary emboli; he previously had been on ELIQUIS  History of renal mass concerning for malignancy no further issues with this.  Trace edema.  No nocturnal dyspnea orthopnea.  No chest pain.  Dyspnea on exertion walking around his house and trying to climb 4 stairs into his kitchen.    Date Cr K Hgb  3/23 1.0 4.6 13.8                Past Medical History:  Diagnosis Date   Anxiety    CAP (community acquired pneumonia) 12/2016   CKD (chronic kidney disease) stage 3, GFR 30-59 ml/min (HCC)    COPD (chronic obstructive pulmonary disease) (HCC)    Diastolic CHF, chronic (HCC)    HLD (hyperlipidemia)    HTN (hypertension)    Ischemic heart disease    Obesity    OSA (obstructive sleep apnea)    Palpitation    Pulmonary embolism (HCC)    PVC (premature ventricular contraction)    SVT (supraventricular tachycardia) (HCC)    Umbilical hernia    WPW (Wolff-Parkinson-White syndrome)     Past Surgical History:  Procedure Laterality Date   CARDIAC CATHETERIZATION N/A 03/16/2016   Procedure: Left Heart Cath and Coronary Angiography;  Surgeon: Troy Sine, MD;  Location: [facility];  Service: Cardiovascular;  Laterality: N/A;   COLONOSCOPY WITH PROPOFOL N/A 06/11/2014   Procedure: COLONOSCOPY WITH PROPOFOL;  Surgeon: Inda Castle, MD;  Location: [facility];  Service: Endoscopy;  Laterality: N/A;   ELECTROPHYSIOLOGIC STUDY N/A 03/23/2016   Procedure: Electrophysiology Study;  Surgeon: Evans Lance, MD;  Location: [place];  Service: Cardiovascular;  Laterality: N/A;   ELECTROPHYSIOLOGIC STUDY N/A 03/23/2016   Procedure: SVT Ablation;  Surgeon: Evans Lance, MD;  Location: [place] [facility];  Service: Cardiovascular;  Laterality: N/A;   ESOPHAGOGASTRODUODENOSCOPY (EGD) WITH PROPOFOL N/A 06/11/2014   Procedure: ESOPHAGOGASTRODUODENOSCOPY (EGD) WITH PROPOFOL;  Surgeon: Inda Castle, MD;  Location: [facility];  Service: Endoscopy;  Laterality: N/A;   HERNIA REPAIR      Current Outpatient Medications  Medication Sig Dispense Refill   ALPRAZolam (XANAX) 1 MG tablet Take 1 tablet (1 mg total) by mouth 2 (two) times daily as needed for anxiety. 10 tablet 0   famotidine (PEPCID) 40 MG tablet Take 40 mg by mouth daily.     fluticasone-salmeterol (ADVAIR) 250-50 MCG/ACT AEPB Inhale 1 puff into the lungs 2 (two) times daily.     furosemide (LASIX) 40 MG tablet Take 1 tablet (40 mg total) by mouth 2 (two) times daily. 60 tablet 0   OVER THE COUNTER MEDICATION Inhale 1  application into the lungs at bedtime. CPAP with O2     potassium chloride SA (K-DUR,KLOR-CON) 20 MEQ tablet Take 2 tablets (40 mEq total) by mouth daily. 60 tablet 0   rosuvastatin (CRESTOR) 10 MG tablet Take 1 tablet (10 mg total) by mouth at bedtime. 30 tablet 0   verapamil (CALAN-SR) 240 MG CR tablet Take 1 tablet (240 mg total) by mouth 2 (two) times daily. 180 tablet 3   No current facility-administered medications for this visit.    Allergies  Allergen Reactions   Eliquis [Apixaban] Other (See Comments)    Bleeding event   Codeine Other (See Comments)    Cant remember     Review of Systems negative except from HPI and PMH  Physical Exam BP 114/62   Pulse 62   Ht '5\' 11"'$  (1.803 m)   Wt 246 lb (111.6 kg)   SpO2 96%   BMI 34.31 kg/m  Well developed and nourished in no acute distress HENT normal Neck supple   Clear on the left crackles in the basilar right Regular rate and rhythm, no murmurs or gallops Abd-soft with active BS No Clubbing cyanosis trace edema Skin-warm and dry A & Oriented  Grossly normal sensory and motor function  ECG sinus at 62 Interval 20/15/44 Right bundle branch block left axis deviation  Assessment and  Plan  SVT with loss of preexcitation   status post incomplete ablation of left lateral accessory pathway  Oxygen dependent COPD  Right bundle branch block left anterior fascicular block  HFpEF  Morbid obesity  Hypertension    Volume status is stable.  We will continue the Lasix at 80 mg daily.  Concomitant potassium.  No recurrent palpitations.  Continue verapamil to 40 mg daily.  He has dyspnea on exertion around the house, I suggested that he use his pulse oximeter to assess his oxygen status

## 2021-10-27 NOTE — Patient Instructions (Signed)

## 2021-10-28 NOTE — Addendum Note (Signed)
Addended by: Michelle Nasuti on: 10/28/2021 09:54 AM   Modules accepted: Orders

## 2021-11-19 DIAGNOSIS — G4733 Obstructive sleep apnea (adult) (pediatric): Secondary | ICD-10-CM | POA: Diagnosis not present

## 2021-11-19 DIAGNOSIS — R972 Elevated prostate specific antigen [PSA]: Secondary | ICD-10-CM | POA: Diagnosis not present

## 2021-11-19 DIAGNOSIS — F4320 Adjustment disorder, unspecified: Secondary | ICD-10-CM | POA: Diagnosis not present

## 2021-11-19 DIAGNOSIS — Z6835 Body mass index (BMI) 35.0-35.9, adult: Secondary | ICD-10-CM | POA: Diagnosis not present

## 2021-11-19 DIAGNOSIS — R7301 Impaired fasting glucose: Secondary | ICD-10-CM | POA: Diagnosis not present

## 2021-11-19 DIAGNOSIS — G250 Essential tremor: Secondary | ICD-10-CM | POA: Diagnosis not present

## 2021-11-19 DIAGNOSIS — Z79899 Other long term (current) drug therapy: Secondary | ICD-10-CM | POA: Diagnosis not present

## 2021-11-19 DIAGNOSIS — E782 Mixed hyperlipidemia: Secondary | ICD-10-CM | POA: Diagnosis not present

## 2021-11-26 DIAGNOSIS — Z0000 Encounter for general adult medical examination without abnormal findings: Secondary | ICD-10-CM | POA: Diagnosis not present

## 2022-03-18 DIAGNOSIS — Z23 Encounter for immunization: Secondary | ICD-10-CM | POA: Diagnosis not present

## 2022-05-13 ENCOUNTER — Telehealth: Payer: Self-pay

## 2022-05-13 NOTE — Patient Outreach (Signed)
  Care Coordination   Initial Visit Note   05/13/2022 Name: Chad Hogan MRN: [id number] DOB: 06-May-1940  Chad Hogan is a 82 y.o. year old male who sees Street, Sharon Mt, MD for primary care. I spoke with  Adela Lank by phone today.  What matters to the patients health and wellness today?  [name] today to review and offer [facility] care coordination program.  Patient reports that he is doing well.  Reports that he has close contact with his MD.  Denies any current needs today.     SDOH assessments and interventions completed:  No     Care Coordination Interventions:  No, not indicated   Follow up plan: No further intervention required.   Encounter Outcome:  Pt. Refused   Tomasa Rand, RN, BSN, CEN [facility] [employer] [phone]

## 2022-05-29 DIAGNOSIS — F4320 Adjustment disorder, unspecified: Secondary | ICD-10-CM | POA: Diagnosis not present

## 2022-05-29 DIAGNOSIS — G250 Essential tremor: Secondary | ICD-10-CM | POA: Diagnosis not present

## 2022-05-29 DIAGNOSIS — I456 Pre-excitation syndrome: Secondary | ICD-10-CM | POA: Diagnosis not present

## 2022-05-29 DIAGNOSIS — I471 Supraventricular tachycardia, unspecified: Secondary | ICD-10-CM | POA: Diagnosis not present

## 2022-05-29 DIAGNOSIS — J8410 Pulmonary fibrosis, unspecified: Secondary | ICD-10-CM | POA: Diagnosis not present

## 2022-05-29 DIAGNOSIS — J9611 Chronic respiratory failure with hypoxia: Secondary | ICD-10-CM | POA: Diagnosis not present

## 2022-05-29 DIAGNOSIS — R7302 Impaired glucose tolerance (oral): Secondary | ICD-10-CM | POA: Diagnosis not present

## 2022-05-29 DIAGNOSIS — B356 Tinea cruris: Secondary | ICD-10-CM | POA: Diagnosis not present

## 2022-05-29 DIAGNOSIS — M62838 Other muscle spasm: Secondary | ICD-10-CM | POA: Diagnosis not present

## 2022-05-29 DIAGNOSIS — I10 Essential (primary) hypertension: Secondary | ICD-10-CM | POA: Diagnosis not present

## 2022-05-29 DIAGNOSIS — E782 Mixed hyperlipidemia: Secondary | ICD-10-CM | POA: Diagnosis not present

## 2022-05-29 DIAGNOSIS — C649 Malignant neoplasm of unspecified kidney, except renal pelvis: Secondary | ICD-10-CM | POA: Diagnosis not present

## 2022-06-26 ENCOUNTER — Other Ambulatory Visit: Payer: Self-pay | Admitting: Internal Medicine

## 2022-07-09 ENCOUNTER — Telehealth: Payer: Self-pay | Admitting: Internal Medicine

## 2022-07-09 NOTE — Telephone Encounter (Signed)
Called pt to inform him that his medication was ready to be picked up at his pharmacy. I advised the pt that is he has any other problems, questions or concerns, to give our office a call back. Pt verbalized understanding.

## 2022-07-09 NOTE — Telephone Encounter (Signed)
*  STAT* If patient is at the pharmacy, call can be transferred to refill team.   1. Which medications need to be refilled? (please list name of each medication and dose if known) verapamil (CALAN-SR) 240 MG CR tablet   2. Which pharmacy/location (including street and city if local pharmacy) is medication to be sent to?  [id number] - [place], [place] - [address] AT [address]    3. Do they need a 30 day or 90 day supply? 90 day

## 2022-09-30 DIAGNOSIS — G4700 Insomnia, unspecified: Secondary | ICD-10-CM | POA: Diagnosis not present

## 2022-09-30 DIAGNOSIS — S80829A Blister (nonthermal), unspecified lower leg, initial encounter: Secondary | ICD-10-CM | POA: Diagnosis not present

## 2022-09-30 DIAGNOSIS — Z6834 Body mass index (BMI) 34.0-34.9, adult: Secondary | ICD-10-CM | POA: Diagnosis not present

## 2022-09-30 DIAGNOSIS — R600 Localized edema: Secondary | ICD-10-CM | POA: Diagnosis not present

## 2022-10-22 ENCOUNTER — Ambulatory Visit: Payer: Medicare HMO | Attending: Internal Medicine | Admitting: Internal Medicine

## 2022-10-22 ENCOUNTER — Encounter: Payer: Self-pay | Admitting: Internal Medicine

## 2022-10-22 VITALS — BP 114/62 | HR 51 | Ht 71.0 in | Wt 243.2 lb

## 2022-10-22 DIAGNOSIS — I471 Supraventricular tachycardia, unspecified: Secondary | ICD-10-CM

## 2022-10-22 DIAGNOSIS — I5032 Chronic diastolic (congestive) heart failure: Secondary | ICD-10-CM

## 2022-10-22 DIAGNOSIS — I456 Pre-excitation syndrome: Secondary | ICD-10-CM

## 2022-10-22 MED ORDER — TORSEMIDE 20 MG PO TABS: ORAL_TABLET | ORAL | 3 refills | Status: DC

## 2022-10-22 NOTE — Patient Instructions (Addendum)
Medication Instructions:  CHANGE Torsemide to 40 mg twice daily for 5 days, then 40 mg once daily thereafter *If you need a refill on your cardiac medications before your next appointment, please call your pharmacy*  Follow-Up: At [facility], you and your health needs are our priority.  As part of our continuing mission to provide you with exceptional heart care, we have created designated Provider Care Teams.  These Care Teams include your primary Cardiologist (physician) and Advanced Practice Providers (APPs -  Physician Assistants and Nurse Practitioners) who all work together to provide you with the care you need, when you need it.  We recommend signing up for the patient portal called "MyChart".  Sign up information is provided on this After Visit Summary.  MyChart is used to connect with patients for Virtual Visits (Telemedicine).  Patients are able to view lab/test results, encounter notes, upcoming appointments, etc.  Non-urgent messages can be sent to your provider as well.   To learn more about what you can do with MyChart, go to [email or web address].    Your next appointment:   1 year(s)  Provider:   Sherryl Manges, MD

## 2022-10-22 NOTE — Progress Notes (Signed)
Patient Care Team: Street, [name], MD as PCP - General (Family Medicine)   HPI  Chad Hogan is a 82 y.o. male Seen in followup for SVT  he has had no significant palpitations. He has a remote history of ventricular preexcitation, now with oxygen dependent COPD.    He underwent a catheter-based procedure with diagnosis of a left lateral accessory pathway which was only partially modified by ablation.  He was started on propafenone.  Because of ongoing problems with tachypalpitations, he was started on carvedilol with up titration from 12--25 without improvement.  He saw AS-NP 12/18  put him back on verapamil.  No recurrent palpitations  History of HFpEF and pulmonary emboli; he previously had been on ELIQUIS  History of renal mass concerning for malignancy    The patient denies chest pain.  There have been no palpitations, lightheadedness or syncope.  Complains of limiting shortness of breath with progressive edema.  Prompting PCP to change from furosemide to torsemide.  Diet is exuberantly replete with fluid as well as sodium.  .   Date Cr K Hgb  3/23 1.0 4.6 13.8                Past Medical History:  Diagnosis Date   Anxiety    CAP (community acquired pneumonia) 12/2016   CKD (chronic kidney disease) stage 3, GFR 30-59 ml/min (HCC)    COPD (chronic obstructive pulmonary disease) (HCC)    Diastolic CHF, chronic (HCC)    HLD (hyperlipidemia)    HTN (hypertension)    Ischemic heart disease    Obesity    OSA (obstructive sleep apnea)    Palpitation    Pulmonary embolism (HCC)    PVC (premature ventricular contraction)    SVT (supraventricular tachycardia)    Umbilical hernia    WPW (Wolff-Parkinson-White syndrome)     Past Surgical History:  Procedure Laterality Date   CARDIAC CATHETERIZATION N/A 03/16/2016   Procedure: Left Heart Cath and Coronary Angiography;  Surgeon: Lennette Bihari, MD;  Location: [facility];  Service: Cardiovascular;   Laterality: N/A;   COLONOSCOPY WITH PROPOFOL N/A 06/11/2014   Procedure: COLONOSCOPY WITH PROPOFOL;  Surgeon: Louis Meckel, MD;  Location: [facility];  Service: Endoscopy;  Laterality: N/A;   ELECTROPHYSIOLOGIC STUDY N/A 03/23/2016   Procedure: Electrophysiology Study;  Surgeon: Marinus Maw, MD;  Location: [facility];  Service: Cardiovascular;  Laterality: N/A;   ELECTROPHYSIOLOGIC STUDY N/A 03/23/2016   Procedure: SVT Ablation;  Surgeon: Marinus Maw, MD;  Location: [facility];  Service: Cardiovascular;  Laterality: N/A;   ESOPHAGOGASTRODUODENOSCOPY (EGD) WITH PROPOFOL N/A 06/11/2014   Procedure: ESOPHAGOGASTRODUODENOSCOPY (EGD) WITH PROPOFOL;  Surgeon: Louis Meckel, MD;  Location: [facility];  Service: Endoscopy;  Laterality: N/A;   HERNIA REPAIR      Current Outpatient Medications  Medication Sig Dispense Refill   ALPRAZolam (XANAX) 1 MG tablet Take 1 tablet (1 mg total) by mouth 2 (two) times daily as needed for anxiety. 10 tablet 0   famotidine (PEPCID) 40 MG tablet Take 40 mg by mouth daily.     fluticasone-salmeterol (ADVAIR) 250-50 MCG/ACT AEPB Inhale 1 puff into the lungs 2 (two) times daily.     OVER THE COUNTER MEDICATION Inhale 1 application into the lungs at bedtime. CPAP with O2     potassium chloride SA (K-DUR,KLOR-CON) 20 MEQ tablet Take 2 tablets (40 mEq total) by mouth daily. 60 tablet 0  rosuvastatin (CRESTOR) 10 MG tablet Take 1 tablet (10 mg total) by mouth at bedtime. 30 tablet 0   torsemide (DEMADEX) 20 MG tablet Take 20 mg by mouth 2 (two) times daily.     traZODone (DESYREL) 50 MG tablet Take 50 mg by mouth at bedtime.     verapamil (CALAN-SR) 240 MG CR tablet TAKE 1 TABLET(240 MG) BY MOUTH TWICE DAILY 180 tablet 1   furosemide (LASIX) 40 MG tablet Take 1 tablet (40 mg total) by mouth 2 (two) times daily. (Patient not taking: Reported on 10/22/2022) 60 tablet 0   No current facility-administered medications for this visit.    Allergies   Allergen Reactions   Eliquis [Apixaban] Other (See Comments)    Bleeding event   Codeine Other (See Comments)    Cant remember    Review of Systems negative except from HPI and PMH  Physical Exam BP 114/62   Pulse (!) 51   Ht 5\' 11"  (1.803 m)   Wt 243 lb 3.2 oz (110.3 kg)   SpO2 95%   BMI 33.92 kg/m  Well developed and nourished in no acute distress HENT normal Neck supple Clear Regular rate and rhythm, no murmurs or gallops Abd-soft with active BS No Clubbing cyanosis edema Skin-warm and dry A & Oriented  Grossly normal sensory and motor function  ECG sinus rhythm at 55 Intervals 24/16/40 with a competitive rhythm that could either represent a modest relative tachycardia sinus tachycardia with a heart rate of about 80 with profound first-degree AV block or junctional rhythm with retrograde conduction.  I would favor the former as the more rapid rate terminates with the VT  Assessment and  Plan  SVT with loss of preexcitation   status post incomplete ablation of left lateral accessory pathway  Oxygen dependent COPD  Right bundle branch block left anterior fascicular block  HFpEF Acute/chronic  Morbid obesity  Hypertension   Blood pressure is well-controlled.  Sinus bradycardia, he is disinclined towards reducing his verapamil.  Volume overloaded--will increase his torsemide from 20 twice daily--40 2 AM I have not taken twice daily for 5 days.  We have discussed the physiology of heart failure including the importance of salt restriction and fluid restriction and have reviewed sources of dietary salt and water.

## 2022-11-02 ENCOUNTER — Other Ambulatory Visit: Payer: Self-pay

## 2022-11-02 MED ORDER — TORSEMIDE 40 MG PO TABS: ORAL_TABLET | ORAL | 2 refills | Status: DC

## 2022-11-11 ENCOUNTER — Telehealth: Payer: Self-pay | Admitting: Internal Medicine

## 2022-11-11 MED ORDER — TORSEMIDE 20 MG PO TABS: ORAL_TABLET | ORAL | 3 refills | Status: DC

## 2022-11-11 NOTE — Telephone Encounter (Signed)
Pt c/o medication issue:  1. Name of Medication: Soaanz  2. How are you currently taking this medication (dosage and times per day)?  Not started yet  3. Are you having a reaction (difficulty breathing--STAT)?   4. What is your medication issue?   Caller stated patient's insurance will not cover this medication.  Caller wants to know if patient can take alternate medicatio such as torsemide 5 mg, 10 mg, 20mg  or 100 mg or furosemide 20 mg, 40 mg or 80 mg.  Caller stated can reference Ref#  [id number] when calling back.

## 2022-11-11 NOTE — Telephone Encounter (Signed)
Per OV note by Graciela Husbands on 10/22/22:  Volume overloaded--will increase his torsemide from 20 twice daily--40 2 AM I have not taken twice daily for 5 days.  Medication Instructions:  CHANGE Torsemide to 40 mg twice daily for 5 days, then 40 mg once daily thereafter  Dan Humphreys is name brand for Torsemide-not sure why RX was sent in for brand name, will resend to pharmacy for generic Demadex which is what MD had planned.

## 2022-11-30 DIAGNOSIS — J982 Interstitial emphysema: Secondary | ICD-10-CM | POA: Diagnosis not present

## 2022-11-30 DIAGNOSIS — Z0000 Encounter for general adult medical examination without abnormal findings: Secondary | ICD-10-CM | POA: Diagnosis not present

## 2022-11-30 DIAGNOSIS — J9611 Chronic respiratory failure with hypoxia: Secondary | ICD-10-CM | POA: Diagnosis not present

## 2022-11-30 DIAGNOSIS — J8410 Pulmonary fibrosis, unspecified: Secondary | ICD-10-CM | POA: Diagnosis not present

## 2022-11-30 DIAGNOSIS — I456 Pre-excitation syndrome: Secondary | ICD-10-CM | POA: Diagnosis not present

## 2022-11-30 DIAGNOSIS — G4733 Obstructive sleep apnea (adult) (pediatric): Secondary | ICD-10-CM | POA: Diagnosis not present

## 2022-11-30 DIAGNOSIS — I872 Venous insufficiency (chronic) (peripheral): Secondary | ICD-10-CM | POA: Diagnosis not present

## 2022-11-30 DIAGNOSIS — Z9981 Dependence on supplemental oxygen: Secondary | ICD-10-CM | POA: Diagnosis not present

## 2022-11-30 DIAGNOSIS — E782 Mixed hyperlipidemia: Secondary | ICD-10-CM | POA: Diagnosis not present

## 2022-12-30 ENCOUNTER — Telehealth: Payer: Self-pay | Admitting: Internal Medicine

## 2022-12-30 ENCOUNTER — Other Ambulatory Visit: Payer: Self-pay

## 2022-12-30 MED ORDER — VERAPAMIL HCL ER 240 MG PO TBCR: 240.0000 mg | EXTENDED_RELEASE_TABLET | Freq: Two times a day (BID) | ORAL | 3 refills | Status: DC

## 2022-12-30 NOTE — Telephone Encounter (Signed)
*  STAT* If patient is at the pharmacy, call can be transferred to refill team.   1. Which medications need to be refilled? (please list name of each medication and dose if known) need a new prescription for Verapamil   2. Would you like to learn more about the convenience, safety, & potential cost savings by using the [facility]?     3. Are you open to using the [facility] ([facility].    4. Which pharmacy/location (including street and city if local pharmacy) is medication to be sent to? [facility] [address] at [address] [place]   5. Do they need a 30 day or 90 day supply? 90 days and refills

## 2022-12-30 NOTE — Telephone Encounter (Signed)
Pt's medication was sent to pt's pharmacy as requested. Confirmation received.  °

## 2022-12-30 NOTE — Telephone Encounter (Signed)
Pt's medication was already sent to pt's pharmacy as requested. Confirmation received.

## 2023-02-08 DIAGNOSIS — Z23 Encounter for immunization: Secondary | ICD-10-CM | POA: Diagnosis not present

## 2023-03-01 ENCOUNTER — Emergency Department (HOSPITAL_COMMUNITY)
Admission: EM | Admit: 2023-03-01 | Discharge: 2023-03-01 | Disposition: A | Discharge status: Home / Self Care | Payer: Medicare HMO | Attending: Emergency Medicine | Admitting: Emergency Medicine

## 2023-03-01 ENCOUNTER — Emergency Department (HOSPITAL_COMMUNITY): Payer: Medicare HMO

## 2023-03-01 ENCOUNTER — Other Ambulatory Visit: Payer: Self-pay

## 2023-03-01 DIAGNOSIS — J449 Chronic obstructive pulmonary disease, unspecified: Secondary | ICD-10-CM | POA: Insufficient documentation

## 2023-03-01 DIAGNOSIS — E669 Obesity, unspecified: Secondary | ICD-10-CM | POA: Diagnosis not present

## 2023-03-01 DIAGNOSIS — R300 Dysuria: Secondary | ICD-10-CM | POA: Diagnosis not present

## 2023-03-01 DIAGNOSIS — N2889 Other specified disorders of kidney and ureter: Secondary | ICD-10-CM | POA: Insufficient documentation

## 2023-03-01 DIAGNOSIS — K8020 Calculus of gallbladder without cholecystitis without obstruction: Secondary | ICD-10-CM | POA: Diagnosis not present

## 2023-03-01 DIAGNOSIS — I129 Hypertensive chronic kidney disease with stage 1 through stage 4 chronic kidney disease, or unspecified chronic kidney disease: Secondary | ICD-10-CM | POA: Diagnosis not present

## 2023-03-01 DIAGNOSIS — R319 Hematuria, unspecified: Secondary | ICD-10-CM | POA: Diagnosis not present

## 2023-03-01 DIAGNOSIS — K5730 Diverticulosis of large intestine without perforation or abscess without bleeding: Secondary | ICD-10-CM | POA: Diagnosis not present

## 2023-03-01 DIAGNOSIS — N189 Chronic kidney disease, unspecified: Secondary | ICD-10-CM | POA: Insufficient documentation

## 2023-03-01 DIAGNOSIS — R310 Gross hematuria: Secondary | ICD-10-CM | POA: Diagnosis not present

## 2023-03-01 DIAGNOSIS — K6389 Other specified diseases of intestine: Secondary | ICD-10-CM | POA: Diagnosis not present

## 2023-03-01 DIAGNOSIS — N3289 Other specified disorders of bladder: Secondary | ICD-10-CM | POA: Diagnosis not present

## 2023-03-01 LAB — CBC WITH DIFFERENTIAL/PLATELET
Abs Immature Granulocytes: 0.04 10*3/uL (ref 0.00–0.07)
Basophils Absolute: 0 10*3/uL (ref 0.0–0.1)
Basophils Relative: 0 %
Eosinophils Absolute: 0 10*3/uL (ref 0.0–0.5)
Eosinophils Relative: 0 %
HCT: 44.4 % (ref 39.0–52.0)
Hemoglobin: 14.1 g/dL (ref 13.0–17.0)
Immature Granulocytes: 0 %
Lymphocytes Relative: 18 %
Lymphs Abs: 2.1 10*3/uL (ref 0.7–4.0)
MCH: 29.3 pg (ref 26.0–34.0)
MCHC: 31.8 g/dL (ref 30.0–36.0)
MCV: 92.1 fL (ref 80.0–100.0)
Monocytes Absolute: 0.9 10*3/uL (ref 0.1–1.0)
Monocytes Relative: 8 %
Neutro Abs: 8.3 10*3/uL — ABNORMAL HIGH (ref 1.7–7.7)
Neutrophils Relative %: 74 %
Platelets: 155 10*3/uL (ref 150–400)
RBC: 4.82 MIL/uL (ref 4.22–5.81)
RDW: 14.6 % (ref 11.5–15.5)
WBC: 11.3 10*3/uL — ABNORMAL HIGH (ref 4.0–10.5)
nRBC: 0 % (ref 0.0–0.2)

## 2023-03-01 LAB — BASIC METABOLIC PANEL
Anion gap: 11 (ref 5–15)
BUN: 18 mg/dL (ref 8–23)
CO2: 24 mmol/L (ref 22–32)
Calcium: 8.3 mg/dL — ABNORMAL LOW (ref 8.9–10.3)
Chloride: 104 mmol/L (ref 98–111)
Creatinine, Ser: 1.13 mg/dL (ref 0.61–1.24)
GFR, Estimated: >60 mL/min (ref >60)
Glucose, Bld: 104 mg/dL — ABNORMAL HIGH (ref 70–99)
Potassium: 4.4 mmol/L (ref 3.5–5.1)
Sodium: 139 mmol/L (ref 135–145)

## 2023-03-01 LAB — I-STAT CHEM 8, ED
BUN: 22 mg/dL (ref 8–23)
Calcium, Ion: 1.08 mmol/L — ABNORMAL LOW (ref 1.15–1.40)
Chloride: 103 mmol/L (ref 98–111)
Creatinine, Ser: 1.2 mg/dL (ref 0.61–1.24)
Glucose, Bld: 104 mg/dL — ABNORMAL HIGH (ref 70–99)
HCT: 46 % (ref 39.0–52.0)
Hemoglobin: 15.6 g/dL (ref 13.0–17.0)
Potassium: 4.3 mmol/L (ref 3.5–5.1)
Sodium: 141 mmol/L (ref 135–145)
TCO2: 28 mmol/L (ref 22–32)

## 2023-03-01 LAB — URINALYSIS, W/ REFLEX TO CULTURE (INFECTION SUSPECTED): RBC / HPF: >50 RBC/hpf (ref 0–5)

## 2023-03-01 MED ORDER — CEPHALEXIN 500 MG PO CAPS: 500.0000 mg | ORAL_CAPSULE | Freq: Four times a day (QID) | ORAL | 0 refills | Status: AC

## 2023-03-01 MED ORDER — IOHEXOL 350 MG/ML SOLN
75.0000 mL | Freq: Once | INTRAVENOUS | Status: AC | PRN
  Administered 2023-03-01: 75 mL via INTRAVENOUS

## 2023-03-01 NOTE — Discharge Instructions (Signed)
You had blood work and urine samples tested today.  Your urine is concerning for an infection.  Therefore, you were given a prescription for an antibiotic, Keflex (cephalexin).  This was sent to [facility] for you.  Please pick this prescription up as soon as possible and take it as prescribed.  You had a CT scan done, which showed worsening of the mass in your right kidney.  This is presumed to be cancer.  He also had a new mass in your abdomen that was not there previously.  We discussed referring you to a cancer doctor to be further evaluated.  The result of that discussion, was that you would prefer to hold off.  If you change your mind, have included the number for a cancer doctor listed above, Dr. Al Pimple.   Please return to the ER if you are unable to urinate, feel that you are not fully emptying your bladder, have severe abdominal pain, chest pain, shortness of breath, passing out.

## 2023-03-01 NOTE — ED Provider Notes (Signed)
[facility] EMERGENCY DEPARTMENT AT [facility] Provider Note   CSN: [id number] Arrival date & time: 03/01/23  1529     History {Add pertinent medical, surgical, social history, OB history to HPI:1} Chief Complaint  Patient presents with   Dysuria    Chad Hogan is a 82 y.o. male.  82 year old with a past medical history of HTN, HLD, HFpEF, SVT, COPD on 3 L, CKD presents for concerns of hematuria.  Patient first noticed blood in his urine Friday.  Has had hematuria nearly every episode of urination since then.  Endorsing dysuria, urinary frequency.  Denies back pain.  Does have a history of kidney stones.  However, feels that his symptoms are not consistent with symptoms he is out of when he had kidney stones.  No nausea or vomiting.  No fevers or chills.  Not currently on anticoagulation.  Intermittently has a sensation of feeling as if his urethra is blocked.  However, once he is able to urinate, does feel that he is fully emptying his bladder. Additionally, patient states that he does have a history of known right renal cell mass.  He opted to not have this further evaluated but it is suspected to be malignant.   Dysuria Presenting symptoms: dysuria        Home Medications Prior to Admission medications   Medication Sig Start Date End Date Taking? Authorizing Provider  ALPRAZolam Prudy Feeler) 1 MG tablet Take 1 tablet (1 mg total) by mouth 2 (two) times daily as needed for anxiety. 12/30/16   Lonia Blood, MD  famotidine (PEPCID) 40 MG tablet Take 40 mg by mouth daily.    [provider]  fluticasone-salmeterol (ADVAIR) 250-50 MCG/ACT AEPB Inhale 1 puff into the lungs 2 (two) times daily. 09/30/21   [provider]  OVER THE COUNTER MEDICATION Inhale 1 application into the lungs at bedtime. CPAP with O2    [provider]  potassium chloride SA (K-DUR,KLOR-CON) 20 MEQ tablet Take 2 tablets (40 mEq total) by mouth daily. 01/01/17   Drema Dallas, MD  rosuvastatin (CRESTOR) 10 MG tablet Take 1 tablet (10 mg total) by mouth at bedtime. 01/01/17   Drema Dallas, MD  torsemide (DEMADEX) 20 MG tablet Take 2 tablets twice daily for 5 days, then decrease to 2 tablets once daily (40mg  total) thereafter 11/11/22   [name], MD  traZODone (DESYREL) 50 MG tablet Take 50 mg by mouth at bedtime. 10/01/22   [provider]  verapamil (CALAN-SR) 240 MG CR tablet Take 1 tablet (240 mg total) by mouth 2 (two) times daily. 12/30/22   [name], MD      Allergies    Eliquis [apixaban] and Codeine    Review of Systems   Review of Systems  Genitourinary:  Positive for dysuria.   As noted in HPI Physical Exam Updated Vital Signs BP (!) 122/56 (BP Location: Left Arm)   Pulse 68   Temp 99 F (37.2 C) (Oral)   Resp 16   SpO2 99%  Physical Exam Vitals reviewed.  Constitutional:      General: He is not in acute distress.    Appearance: He is obese. He is not ill-appearing, toxic-appearing or diaphoretic.  HENT:     Head: Normocephalic.  Cardiovascular:     Rate and Rhythm: Normal rate and regular rhythm.     Heart sounds: Normal heart sounds. No murmur heard.    No friction rub. No gallop.  Pulmonary:     Effort: Pulmonary effort is normal. No respiratory distress.     Breath sounds: Examination of the left-lower field reveals rales. Rales present. No wheezing or rhonchi.  Abdominal:     General: There is no distension.     Palpations: Abdomen is soft.     Tenderness: There is no abdominal tenderness. There is no right CVA tenderness, left CVA tenderness, guarding or rebound.  Skin:    General: Skin is warm and dry.     Coloration: Skin is not pale.  Neurological:     Mental Status: He is alert.     ED Results / Procedures / Treatments   Labs (all labs ordered are listed, but only abnormal results are displayed) Labs Reviewed - No data to display  EKG None  Radiology No results  found.  Procedures Procedures  {Document cardiac monitor, telemetry assessment procedure when appropriate:1}  Medications Ordered in ED Medications - No data to display  ED Course/ Medical Decision Making/ A&P   {   Click here for ABCD2, HEART and other calculatorsREFRESH Note before signing :1}                              Medical Decision Making 82 year old male presents here for hematuria with associated dysuria and urinary frequency. Vitals reassuring on presentation.  On exam, patient's abdomen is soft, nontender.  No CVA tenderness.  He is not pale appearing.  He is on his baseline home oxygen requirements.    Initial differential includes UTI, hemorrhagic cystitis, pyelonephritis, nephrolithiasis, malignancy, acute blood loss anemia, AKI, electrolyte abnormality.  Will plan to obtain labs, including BMP and CBC as well as urinalysis with reflex culture to evaluate for UTI.  Will obtain CT abdomen/pelvis with contrast to further evaluate known renal cell mass as well as for any stones or evidence of bile nephritis.  Amount and/or Complexity of Data Reviewed Labs: ordered. Radiology: ordered.     {Document critical care time when appropriate:1} {Document review of labs and clinical decision tools ie heart score, Chads2Vasc2 etc:1}  {Document your independent review of radiology images, and any outside records:1} {Document your discussion with family members, caretakers, and with consultants:1} {Document social determinants of health affecting pt's care:1} {Document your decision making why or why not admission, treatments were needed:1} Final Clinical Impression(s) / ED Diagnoses Final diagnoses:  None    Rx / DC Orders ED Discharge Orders     None

## 2023-03-01 NOTE — ED Triage Notes (Signed)
Pt arrived via [facility] from home for dysuria since Saturday, with painful urination. 3L [place] at baseline, COPD. A&Ox4, GCS 15.   HR 85 RR 14 SPO2 97% 3 L  BP 142/82

## 2023-03-01 NOTE — ED Provider Notes (Incomplete)
Chad Hogan Provider Note   CSN: [id number] Arrival date & time: 03/01/23  1529     History {Add pertinent medical, surgical, social history, OB history to HPI:1} Chief Complaint  Patient presents with  . Hematuria    Chad Hogan is a 82 y.o. male.  82 year old with a past medical history of HTN, HLD, HFpEF, SVT, COPD on 3 L, CKD presents for concerns of hematuria.  Patient first noticed Hogan in his urine Friday.  Has had hematuria nearly every episode of urination since then.  Endorsing dysuria, urinary frequency.  Denies back pain.  Does have a history of kidney stones.  However, feels that his symptoms are not consistent with symptoms he is out of when he had kidney stones.  No nausea or vomiting.  No fevers or chills.  Not currently on anticoagulation.  Intermittently has a sensation of feeling as if his urethra is blocked.  However, once he is able to urinate, does feel that he is fully emptying his bladder. Additionally, patient states that he does have a history of known right renal cell mass.  He opted to not have this further evaluated but it is suspected to be malignant.  The history is provided by the patient.  Dysuria Presenting symptoms: dysuria        Home Medications Prior to Admission medications   Medication Sig Start Date End Date Taking? Authorizing Provider  cephALEXin (KEFLEX) 500 MG capsule Take 1 capsule (500 mg total) by mouth 4 (four) times daily for 10 days. 03/01/23 03/11/23 Yes Chad Flatten, MD  ALPRAZolam Prudy Feeler) 1 MG tablet Take 1 tablet (1 mg total) by mouth 2 (two) times daily as needed for anxiety. 12/30/16   Chad Blood, MD  famotidine (PEPCID) 40 MG tablet Take 40 mg by mouth daily.    [provider]  fluticasone-salmeterol (ADVAIR) 250-50 MCG/ACT AEPB Inhale 1 puff into the lungs 2 (two) times daily. 09/30/21   [provider]  OVER THE COUNTER MEDICATION Inhale 1 application  into the lungs at bedtime. CPAP with O2    [provider]  potassium chloride SA (K-DUR,KLOR-CON) 20 MEQ tablet Take 2 tablets (40 mEq total) by mouth daily. 01/01/17   Chad Dallas, MD  rosuvastatin (CRESTOR) 10 MG tablet Take 1 tablet (10 mg total) by mouth at bedtime. 01/01/17   Chad Dallas, MD  torsemide (DEMADEX) 20 MG tablet Take 2 tablets twice daily for 5 days, then decrease to 2 tablets once daily (40mg  total) thereafter 11/11/22   Chad Salvia, MD  traZODone (DESYREL) 50 MG tablet Take 50 mg by mouth at bedtime. 10/01/22   [provider]  verapamil (CALAN-SR) 240 MG CR tablet Take 1 tablet (240 mg total) by mouth 2 (two) times daily. 12/30/22   Chad Salvia, MD      Allergies    Eliquis [apixaban] and Codeine    Review of Systems   As noted in HPI  Physical Exam Updated Vital Signs BP (!) 121/56   Pulse 77   Temp 99.6 F (37.6 C) (Oral)   Resp 14   SpO2 93%  Physical Exam Vitals reviewed.  Constitutional:      General: He is not in acute distress.    Appearance: He is obese. He is not ill-appearing, toxic-appearing or diaphoretic.  HENT:     Head: Normocephalic.  Cardiovascular:     Rate and Rhythm: Normal rate and regular rhythm.  Heart sounds: Normal heart sounds. No murmur heard.    No friction rub. No gallop.  Pulmonary:     Effort: Pulmonary effort is normal. No respiratory distress.     Breath sounds: Examination of the left-lower field reveals rales. Rales present. No wheezing or rhonchi.  Abdominal:     General: There is no distension.     Palpations: Abdomen is soft.     Tenderness: There is no abdominal tenderness. There is no right CVA tenderness, left CVA tenderness, guarding or rebound.  Skin:    General: Skin is warm and dry.     Coloration: Skin is not pale.  Neurological:     Mental Status: He is alert.     ED Results / Procedures / Treatments   Labs (all labs ordered are listed, but only abnormal results are  displayed) Labs Reviewed  URINALYSIS, W/ REFLEX TO CULTURE (INFECTION SUSPECTED) - Abnormal; Notable for the following components:      Result Value   Color, Urine RED (*)    APPearance TURBID (*)    Glucose, UA   (*)    Value: TEST NOT REPORTED DUE TO COLOR INTERFERENCE OF URINE PIGMENT   Hgb urine dipstick   (*)    Value: TEST NOT REPORTED DUE TO COLOR INTERFERENCE OF URINE PIGMENT   Bilirubin Urine   (*)    Value: TEST NOT REPORTED DUE TO COLOR INTERFERENCE OF URINE PIGMENT   Ketones, ur   (*)    Value: TEST NOT REPORTED DUE TO COLOR INTERFERENCE OF URINE PIGMENT   Protein, ur   (*)    Value: TEST NOT REPORTED DUE TO COLOR INTERFERENCE OF URINE PIGMENT   Nitrite   (*)    Value: TEST NOT REPORTED DUE TO COLOR INTERFERENCE OF URINE PIGMENT   Leukocytes,Ua   (*)    Value: TEST NOT REPORTED DUE TO COLOR INTERFERENCE OF URINE PIGMENT   Bacteria, UA FEW (*)    All other components within normal limits  CBC WITH DIFFERENTIAL/PLATELET - Abnormal; Notable for the following components:   WBC 11.3 (*)    Neutro Abs 8.3 (*)    All other components within normal limits  BASIC METABOLIC PANEL - Abnormal; Notable for the following components:   Glucose, Bld 104 (*)    Calcium 8.3 (*)    All other components within normal limits  I-STAT CHEM 8, ED - Abnormal; Notable for the following components:   Glucose, Bld 104 (*)    Calcium, Ion 1.08 (*)    All other components within normal limits    EKG None  Radiology CT ABDOMEN PELVIS W CONTRAST  Result Date: 03/01/2023 CLINICAL DATA:  Hematuria, renal mass EXAM: CT ABDOMEN AND PELVIS WITH CONTRAST TECHNIQUE: Multidetector CT imaging of the abdomen and pelvis was performed using the standard protocol following bolus administration of intravenous contrast. RADIATION DOSE REDUCTION: This exam was performed according to the departmental dose-optimization program which includes automated exposure control, adjustment of the mA and/or kV according  to patient size and/or use of iterative reconstruction technique. CONTRAST:  75mL OMNIPAQUE IOHEXOL 350 MG/ML SOLN COMPARISON:  10/03/2018, 07/27/2016 FINDINGS: Lower chest: Bibasilar scarring and fibrosis. Bilateral lower lobe bronchiectasis. Trace left pleural effusion. Hepatobiliary: Nodular contour of liver consistent with cirrhosis. No focal parenchymal liver abnormality. Calcified gallstones without evidence of acute cholecystitis. Pancreas: Unremarkable. No pancreatic ductal dilatation or surrounding inflammatory changes. Spleen: Normal in size without focal abnormality. Adrenals/Urinary Tract: Progressive enlargement of the complex mass within  the lower pole right kidney consistent with worsening renal cell carcinoma. This mass now measures up to 10.4 x 7.2 x 11.0 cm. This mass now extends to involve the renal pelvis and renal sinus fat. No evidence of tumor thrombus within the renal vasculature at this time. The left kidney is unremarkable. There is soft tissue density in the distal right ureter protruding through the right UVJ, measuring up to 1.1 cm reference image 82/4, concerning for downstream uroepithelial malignancy. Dependent nonobstructing 1 cm bladder calculus. The adrenals are unremarkable. Stomach/Bowel: No bowel obstruction or ileus. Normal appendix right lower quadrant. Diverticulosis throughout the colon without evidence of acute diverticulitis. Vascular/Lymphatic: There is a 4.8 x 4.5 cm soft tissue mass anterior to the duodenum reference image 51/4, previously measuring 3.6 x 3.1 cm. This may reflect progressive adenopathy or other nonspecific mesenteric neoplasm. No other discrete adenopathy within the abdomen or pelvis. Atherosclerosis of the aorta and its distal branches. Reproductive: Marked enlargement of the prostate. Other: Trace free fluid greatest in the upper abdomen and left flank. No free intraperitoneal gas. No abdominal wall hernia. Musculoskeletal: No acute or destructive  bony abnormalities. Reconstructed images demonstrate no additional findings. IMPRESSION: 1. Enlarging heterogeneous mass lower pole right kidney consistent with progressive renal cell carcinoma. This mass now extends into the right renal pelvis and renal sinus fat, without evidence of extension into the renal vasculature. 2. Downstream soft tissue density within the distal right ureter protruding through the right UVJ, which may reflect distal uroepithelial neoplasm, metastasis, or Hogan clot. 3. Enlarging heterogeneous soft tissue mesenteric mass anterior to the duodenum. This may reflect pathologic adenopathy or other enlarging mesenteric neoplasm such as GIST. 4. Trace ascites. 5. Nonobstructing bladder calculus. 6. Marked enlargement of the prostate. 7. Cholelithiasis without cholecystitis. 8. Cirrhosis. 9. Small left pleural effusion. 10. Distal colonic diverticulosis without diverticulitis. Electronically Signed   By: Sharlet Salina M.D.   On: 03/01/2023 21:29    Procedures Procedures  {Document cardiac monitor, telemetry assessment procedure when appropriate:1}  Medications Ordered in ED Medications  iohexol (OMNIPAQUE) 350 MG/ML injection 75 mL (75 mLs Intravenous Contrast Given 03/01/23 1833)    ED Course/ Medical Decision Making/ A&P   {   Click here for ABCD2, HEART and other calculatorsREFRESH Note before signing :1}                              Medical Decision Making 82 year old male presents here for hematuria with associated dysuria and urinary frequency. Vitals reassuring on presentation.  On exam, patient's abdomen is soft, nontender.  No CVA tenderness.  He is not pale appearing.  He is on his baseline home oxygen requirements.    Initial differential includes UTI, hemorrhagic cystitis, pyelonephritis, nephrolithiasis, malignancy, acute Hogan loss anemia, AKI, electrolyte abnormality.  Will plan to obtain labs, including BMP and CBC as well as urinalysis with reflex culture to  evaluate for UTI.  Will obtain CT abdomen/pelvis with contrast to further evaluate known renal cell mass as well as for any stones or evidence of pyelonephritis.  CT imaging independently reviewed.  Patient has worsening right renal mass.  Also has mass in the lower abdomen that is new.  Findings concerning for progressively worsening malignant disease.  Patient also has soft tissue density within the right ureter, which could be reflective of Hogan clot versus malignancy.  He has a nonobstructive bladder calculus.  No evidence of pyelonephritis.  CT imaging findings were  communicated with the patient.  I explicitly stated that these findings were very concerning for worsening malignancy.  I offered referral to oncology.  However, patient declined.  UA obtained and is grossly bloody.  Unable to perform full analysis due to severity of hematuria.  He has had hematuria in the past that improved with antibiotic therapy.  Therefore, we will prescribe 10-day course of Keflex for potential hemorrhagic cystitis and UTI.  Offered Foley placement to prevent  Amount and/or Complexity of Data Reviewed Labs: ordered. Radiology: ordered.  Risk Prescription drug management.     {Document critical care time when appropriate:1} {Document review of labs and clinical decision tools ie heart score, Chads2Vasc2 etc:1}  {Document your independent review of radiology images, and any outside records:1} {Document your discussion with family members, caretakers, and with consultants:1} {Document social determinants of health affecting pt's care:1} {Document your decision making why or why not admission, treatments were needed:1} Final Clinical Impression(s) / ED Diagnoses Final diagnoses:  Hematuria, unspecified type  Renal mass    Rx / DC Orders ED Discharge Orders          Ordered    cephALEXin (KEFLEX) 500 MG capsule  4 times daily        03/01/23 2225    Ambulatory referral to Urology        03/01/23  2225

## 2023-03-04 ENCOUNTER — Telehealth (HOSPITAL_COMMUNITY): Payer: Self-pay | Admitting: Emergency Medicine

## 2023-03-04 DIAGNOSIS — F4320 Adjustment disorder, unspecified: Secondary | ICD-10-CM | POA: Diagnosis not present

## 2023-03-04 DIAGNOSIS — Z23 Encounter for immunization: Secondary | ICD-10-CM | POA: Diagnosis not present

## 2023-03-04 DIAGNOSIS — C649 Malignant neoplasm of unspecified kidney, except renal pelvis: Secondary | ICD-10-CM | POA: Diagnosis not present

## 2023-03-04 DIAGNOSIS — Z6833 Body mass index (BMI) 33.0-33.9, adult: Secondary | ICD-10-CM | POA: Diagnosis not present

## 2023-03-04 DIAGNOSIS — N2889 Other specified disorders of kidney and ureter: Secondary | ICD-10-CM

## 2023-03-04 NOTE — Telephone Encounter (Signed)
Received a request from the oncology team to put in an ambulatory consult order.  Patient initially had declined wanting to see oncology.  We had given him the phone number for the oncology clinic in case he changes mind.  We will still put in a referral at the request of oncology service.

## 2023-03-08 ENCOUNTER — Encounter: Payer: Self-pay | Admitting: *Deleted

## 2023-03-08 NOTE — Progress Notes (Signed)
Received a referral to medical oncology for possible renal cell carcinoma.  Reviewed patient's chart with Dr. Arbutus Ped who advised that patient see urologist first.  Referral was also placed with [facility] by ED MD.  Phone call made to urology office who has not received referral.  Referral information faxed to urology office.  I spoke to patient regarding appointments and he prefers to be seen in [place].  Will f/u with urology to get patient scheduled and follow to schedule with medical oncology after urology.

## 2023-03-10 DIAGNOSIS — R319 Hematuria, unspecified: Secondary | ICD-10-CM | POA: Diagnosis not present

## 2023-03-10 DIAGNOSIS — R300 Dysuria: Secondary | ICD-10-CM | POA: Diagnosis not present

## 2023-03-10 DIAGNOSIS — D49512 Neoplasm of unspecified behavior of left kidney: Secondary | ICD-10-CM | POA: Diagnosis not present

## 2023-03-16 DIAGNOSIS — R310 Gross hematuria: Secondary | ICD-10-CM | POA: Diagnosis not present

## 2023-03-16 DIAGNOSIS — D49511 Neoplasm of unspecified behavior of right kidney: Secondary | ICD-10-CM | POA: Diagnosis not present

## 2023-03-25 ENCOUNTER — Other Ambulatory Visit: Payer: Self-pay | Admitting: Urology

## 2023-03-25 DIAGNOSIS — N2889 Other specified disorders of kidney and ureter: Secondary | ICD-10-CM

## 2023-03-29 ENCOUNTER — Ambulatory Visit (INDEPENDENT_AMBULATORY_CARE_PROVIDER_SITE_OTHER): Payer: Medicare HMO | Admitting: Urology

## 2023-03-29 DIAGNOSIS — C641 Malignant neoplasm of right kidney, except renal pelvis: Secondary | ICD-10-CM

## 2023-03-30 ENCOUNTER — Other Ambulatory Visit: Payer: Self-pay | Admitting: Urology

## 2023-03-30 NOTE — Progress Notes (Signed)
Pt did not show for appt

## 2023-04-02 ENCOUNTER — Encounter: Payer: Self-pay | Admitting: *Deleted

## 2023-04-02 NOTE — Progress Notes (Signed)
I called patient to follow up on his visit with urology.  Dr. Marlou Porch placed a referral to IR for possible renal artery embolization due to patient's hematuria.  Patient has not been scheduled but reports that he has not had any more hematuria.  I talked with him about scheduling an appointment with a medical oncologist and he said that [facility] would be more convenient for him and he would prefer to be seen there.  I informed him that I would have his referral sent to [facility] and they will be contacting him to schedule an appointment.  Patient verbalized understanding.

## 2023-04-09 ENCOUNTER — Encounter: Payer: Self-pay | Admitting: Oncology

## 2023-04-18 NOTE — Progress Notes (Signed)
 [facility]  [address] [place],  [place]  [address] [phone]  Clinic Day:  04/20/2023  Referring physician: Charlyn Sora, MD   HISTORY OF PRESENT ILLNESS:  The patient is a 83 y.o. male who I was asked to consult upon for a right renal mass.  A CT scan in mid November 2024 revealed an 11 cm right renal mass, as well as a questionable lymph node/mass in his mesentery.  A distal right ureteral mass at the ureterovesicular junction was also seen, measuring 1.1 cm.  According to the patient, he has been dealing with gross hematuria for the past year, which he finally brought to the attention of his primary care provider.  Of note, the patient has known about having a right renal lesion since at least 2017-18.  At that time, CT scans and abdominal MRIs showed a lesion on the lower pole of his right kidney that had progressively enlarged in size.  Furthermore, the mesenteric mass that was recently commented on was seen back in 2017-18.  Per inspection of his scans from 8 years ago, this mass essentially has not changed in size.  Ultimately, it was not thought to be related to his renal cancer.  The patient was very vague as to why he did not have this renal mass evaluated much earlier.  It appears he got frustrated with getting scans done periodically and ultimately stopped following up with his urologist with respect to having this right renal mass further evaluated.  He also did not let his family know about his right renal mass until recently.  He denies having any weight loss over the past 12 months.  He also denies having any right-sided costovertebral angle tenderness. The patient claims he notices gross hematuria 1-2 out of every 10 times he urinates.  PAST MEDICAL HISTORY:   Past Medical History:  Diagnosis Date   Anxiety    CAP (community acquired pneumonia) 12/2016   CKD (chronic kidney disease) stage 3, GFR 30-59 ml/min (HCC)    COPD (chronic  obstructive pulmonary disease) (HCC)    Diastolic CHF, chronic (HCC)    HLD (hyperlipidemia)    HTN (hypertension)    Ischemic heart disease    Obesity    OSA (obstructive sleep apnea)    Palpitation    Pulmonary embolism (HCC)    PVC (premature ventricular contraction)    SVT (supraventricular tachycardia) (HCC)    Umbilical hernia    WPW (Wolff-Parkinson-White syndrome)     PAST SURGICAL HISTORY:   Past Surgical History:  Procedure Laterality Date   CARDIAC CATHETERIZATION N/A 03/16/2016   Procedure: Left Heart Cath and Coronary Angiography;  Surgeon: Debby DELENA Sor, MD;  Location: [facility] INVASIVE CV LAB;  Service: Cardiovascular;  Laterality: N/A;   COLONOSCOPY WITH PROPOFOL  N/A 06/11/2014   Procedure: COLONOSCOPY WITH PROPOFOL ;  Surgeon: Lamar JONETTA Aho, MD;  Location: [facility] ENDOSCOPY;  Service: Endoscopy;  Laterality: N/A;   ELECTROPHYSIOLOGIC STUDY N/A 03/23/2016   Procedure: Electrophysiology Study;  Surgeon: Danelle LELON Birmingham, MD;  Location: [facility] INVASIVE CV LAB;  Service: Cardiovascular;  Laterality: N/A;   ELECTROPHYSIOLOGIC STUDY N/A 03/23/2016   Procedure: SVT Ablation;  Surgeon: Danelle LELON Birmingham, MD;  Location: [facility] INVASIVE CV LAB;  Service: Cardiovascular;  Laterality: N/A;   ESOPHAGOGASTRODUODENOSCOPY (EGD) WITH PROPOFOL  N/A 06/11/2014   Procedure: ESOPHAGOGASTRODUODENOSCOPY (EGD) WITH PROPOFOL ;  Surgeon: Lamar JONETTA Aho, MD;  Location: [facility] ENDOSCOPY;  Service: Endoscopy;  Laterality: N/A;   HERNIA REPAIR  CURRENT MEDICATIONS:   Current Outpatient Medications  Medication Sig Dispense Refill   ALPRAZolam  (XANAX ) 1 MG tablet Take 1 tablet (1 mg total) by mouth 2 (two) times daily as needed for anxiety. 10 tablet 0   famotidine (PEPCID) 40 MG tablet Take 40 mg by mouth daily.     fluticasone -salmeterol (ADVAIR) 250-50 MCG/ACT AEPB Inhale 1 puff into the lungs 2 (two) times daily.     OVER THE COUNTER MEDICATION Inhale 1 application into the lungs at bedtime. CPAP with O2      potassium chloride  SA (K-DUR,KLOR-CON ) 20 MEQ tablet Take 2 tablets (40 mEq total) by mouth daily. 60 tablet 0   rosuvastatin  (CRESTOR ) 10 MG tablet Take 1 tablet (10 mg total) by mouth at bedtime. 30 tablet 0   torsemide  (DEMADEX ) 20 MG tablet Take 2 tablets twice daily for 5 days, then decrease to 2 tablets once daily (40mg  total) thereafter 180 tablet 3   traZODone (DESYREL) 50 MG tablet Take 50 mg by mouth at bedtime.     verapamil  (CALAN -SR) 240 MG CR tablet Take 1 tablet (240 mg total) by mouth 2 (two) times daily. 180 tablet 3   No current facility-administered medications for this visit.    ALLERGIES:   Allergies  Allergen Reactions   Eliquis [Apixaban] Other (See Comments)    Bleeding event   Codeine Other (See Comments)    Cant remember    FAMILY HISTORY:   Family History  Problem Relation Age of Onset   CVA Mother    Cancer Father        UNK PRIMARY   Liver cancer Other    Heart disease Other     SOCIAL HISTORY:  The patient was born and raised in [place].  He currently lives in the [place] community.  He is divorced, with 2 children and 2 grandchildren.  He was a chief technology officer for 22 years.  He also worked at a [employer] for 19 years.  He smoked as much as 2 packs of cigarettes daily for 40 years before quitting a handful of years ago.  He formerly drank alcohol  on rare occasions.  REVIEW OF SYSTEMS:  Review of Systems  Constitutional:  Negative for fatigue, fever and unexpected weight change.  HENT:   Positive for hearing loss.   Respiratory:  Positive for shortness of breath. Negative for chest tightness, cough and hemoptysis.   Cardiovascular:  Negative for chest pain and palpitations.  Gastrointestinal:  Negative for abdominal distention, abdominal pain, blood in stool, constipation, diarrhea, nausea and vomiting.  Genitourinary:  Positive for bladder incontinence and hematuria. Negative for dysuria and frequency.   Musculoskeletal:   Positive for back pain. Negative for arthralgias and myalgias.  Skin:  Negative for itching and rash.  Neurological:  Negative for dizziness, headaches and light-headedness.  Psychiatric/Behavioral:  Negative for depression and suicidal ideas. The patient is nervous/anxious.      PHYSICAL EXAM:  Blood pressure 135/76, pulse 83, temperature 98.3 F (36.8 C), temperature source Oral, resp. rate 18, height 5' 11 (1.803 m), SpO2 91%. Wt Readings from Last 3 Encounters:  10/22/22 243 lb 3.2 oz (110.3 kg)  10/27/21 246 lb (111.6 kg)  04/25/20 267 lb (121.1 kg)   Body mass index is 33.92 kg/m. Performance status (ECOG): 3 - Symptomatic, >50% confined to bed Physical Exam Constitutional:      Appearance: Normal appearance. He is obese. He is ill-appearing.     Comments: An obese, chronically ill-appearing older  gentleman who is wearing oxygen  per nasal cannula  HENT:     Mouth/Throat:     Mouth: Mucous membranes are moist.     Pharynx: Oropharynx is clear. No oropharyngeal exudate or posterior oropharyngeal erythema.  Cardiovascular:     Rate and Rhythm: Normal rate and regular rhythm.     Heart sounds: No murmur heard.    No friction rub. No gallop.  Pulmonary:     Effort: Pulmonary effort is normal. No respiratory distress.     Breath sounds: Decreased breath sounds present. No wheezing, rhonchi or rales.  Abdominal:     General: Bowel sounds are normal. There is no distension.     Palpations: Abdomen is soft. There is no mass.     Tenderness: There is no abdominal tenderness.  Musculoskeletal:        General: No swelling.     Right lower leg: No edema.     Left lower leg: No edema.  Lymphadenopathy:     Cervical: No cervical adenopathy.     Upper Body:     Right upper body: No supraclavicular or axillary adenopathy.     Left upper body: No supraclavicular or axillary adenopathy.     Lower Body: No right inguinal adenopathy. No left inguinal adenopathy.  Skin:    General:  Skin is warm.     Coloration: Skin is not jaundiced.     Findings: No lesion or rash.  Neurological:     General: No focal deficit present.     Mental Status: He is alert and oriented to person, place, and time. Mental status is at baseline.  Psychiatric:        Mood and Affect: Mood normal.        Behavior: Behavior normal.        Thought Content: Thought content normal.    LABS:      Latest Ref Rng & Units 04/20/2023    2:33 PM 03/01/2023    5:11 PM 03/01/2023    4:30 PM  CBC  WBC 4.0 - 10.5 K/uL 7.1   11.3   Hemoglobin 13.0 - 17.0 g/dL 87.4  84.3  85.8   Hematocrit 39.0 - 52.0 % 39.9  46.0  44.4   Platelets 150 - 400 K/uL 174   155       Latest Ref Rng & Units 04/20/2023    2:33 PM 03/01/2023    5:11 PM 03/01/2023    4:30 PM  CMP  Glucose 70 - 99 mg/dL 87  895  895   BUN 8 - 23 mg/dL 13  22  18    Creatinine 0.61 - 1.24 mg/dL 8.81  8.79  8.86   Sodium 135 - 145 mmol/L 142  141  139   Potassium 3.5 - 5.1 mmol/L 3.9  4.3  4.4   Chloride 98 - 111 mmol/L 106  103  104   CO2 22 - 32 mmol/L 26   24   Calcium  8.9 - 10.3 mg/dL 8.4   8.3   Total Protein 6.5 - 8.1 g/dL 6.2     Total Bilirubin 0.0 - 1.2 mg/dL 0.3     Alkaline Phos 38 - 126 U/L 238     AST 15 - 41 U/L 39     ALT 0 - 44 U/L 24       ASSESSMENT & PLAN:  An 83 y.o. male who I was asked to consult upon for what appears to be locally advanced right-sided renal  cell carcinoma.  In clinic today, I went over all of his recent CT scans with him, as well as CT scans/MRIs done back in 2017-18.  I wanted to make sure that both he and his family saw this renal lesion had been seen years ago, which has progressively grown over time.  His most recent scans also showed a lesion at his right ureterovesicular junction which may represent a drop metastasis from his much larger renal cell cancer.  As mentioned previously, the right mesenteric mass seen on his recent scans has been essentially the same size for the past 7-8 years.  This  ultimately may have nothing to do with his right renal cell cancer at all.  Furthermore, it may very well be benign in nature.  Most of this visit was spent trying to get an idea of how aggressive/involved the patient wants me to be with his workup and management.  Ultimately, he is not sure what he wishes to do.  We did come to a consensus to at least get a PET scan done to see if he has any obvious areas of distant metastasis that may not be getting picked up on CT scans.  A PET scan will be done on January 23rd; I will see him back the following day to go over the images.  Possibilities for future management include localized therapy, such as embolization, to address the hematuria coming from his right renal mass.  Other possibilities would be palliative radiation or even a palliative right nephrectomy.  The fact that his cancer has not had widespread metastasis despite being present for at least 7-8 years suggests that its overall biology may not be very aggressive.  Moving forward, the patient understands it will be up to him as to how much or how little he wishes to do with respect to his disease evaluation/management.  The patient understands all the plans discussed today and is in agreement with them.  I do appreciate Charlyn Sora, MD for his new consult.   Nayzeth Altman DELENA Kerns, MD

## 2023-04-20 ENCOUNTER — Inpatient Hospital Stay: Payer: Medicare HMO | Attending: Oncology | Admitting: Oncology

## 2023-04-20 ENCOUNTER — Other Ambulatory Visit: Payer: Self-pay | Admitting: Oncology

## 2023-04-20 ENCOUNTER — Inpatient Hospital Stay: Payer: Medicare HMO

## 2023-04-20 ENCOUNTER — Encounter: Payer: Self-pay | Admitting: Oncology

## 2023-04-20 VITALS — BP 135/76 | HR 83 | Temp 98.3°F | Resp 18 | Ht 71.0 in

## 2023-04-20 DIAGNOSIS — N2889 Other specified disorders of kidney and ureter: Secondary | ICD-10-CM | POA: Insufficient documentation

## 2023-04-20 DIAGNOSIS — J449 Chronic obstructive pulmonary disease, unspecified: Secondary | ICD-10-CM | POA: Diagnosis not present

## 2023-04-20 DIAGNOSIS — I2109 ST elevation (STEMI) myocardial infarction involving other coronary artery of anterior wall: Secondary | ICD-10-CM | POA: Diagnosis not present

## 2023-04-20 DIAGNOSIS — M542 Cervicalgia: Secondary | ICD-10-CM | POA: Diagnosis not present

## 2023-04-20 DIAGNOSIS — N183 Chronic kidney disease, stage 3 unspecified: Secondary | ICD-10-CM | POA: Diagnosis not present

## 2023-04-20 DIAGNOSIS — I609 Nontraumatic subarachnoid hemorrhage, unspecified: Secondary | ICD-10-CM | POA: Diagnosis not present

## 2023-04-20 DIAGNOSIS — I130 Hypertensive heart and chronic kidney disease with heart failure and stage 1 through stage 4 chronic kidney disease, or unspecified chronic kidney disease: Secondary | ICD-10-CM | POA: Insufficient documentation

## 2023-04-20 DIAGNOSIS — E7800 Pure hypercholesterolemia, unspecified: Secondary | ICD-10-CM | POA: Diagnosis not present

## 2023-04-20 DIAGNOSIS — Z7982 Long term (current) use of aspirin: Secondary | ICD-10-CM | POA: Diagnosis not present

## 2023-04-20 DIAGNOSIS — I444 Left anterior fascicular block: Secondary | ICD-10-CM | POA: Diagnosis not present

## 2023-04-20 DIAGNOSIS — W19XXXA Unspecified fall, initial encounter: Secondary | ICD-10-CM | POA: Diagnosis not present

## 2023-04-20 DIAGNOSIS — F419 Anxiety disorder, unspecified: Secondary | ICD-10-CM | POA: Diagnosis not present

## 2023-04-20 DIAGNOSIS — R58 Hemorrhage, not elsewhere classified: Secondary | ICD-10-CM | POA: Diagnosis not present

## 2023-04-20 DIAGNOSIS — I440 Atrioventricular block, first degree: Secondary | ICD-10-CM | POA: Diagnosis not present

## 2023-04-20 DIAGNOSIS — I5032 Chronic diastolic (congestive) heart failure: Secondary | ICD-10-CM | POA: Diagnosis not present

## 2023-04-20 DIAGNOSIS — R9431 Abnormal electrocardiogram [ECG] [EKG]: Secondary | ICD-10-CM | POA: Diagnosis not present

## 2023-04-20 DIAGNOSIS — S098XXA Other specified injuries of head, initial encounter: Secondary | ICD-10-CM | POA: Diagnosis not present

## 2023-04-20 DIAGNOSIS — Z87891 Personal history of nicotine dependence: Secondary | ICD-10-CM | POA: Diagnosis not present

## 2023-04-20 DIAGNOSIS — S0101XA Laceration without foreign body of scalp, initial encounter: Secondary | ICD-10-CM | POA: Diagnosis not present

## 2023-04-20 DIAGNOSIS — F32A Depression, unspecified: Secondary | ICD-10-CM | POA: Diagnosis not present

## 2023-04-20 DIAGNOSIS — Z79899 Other long term (current) drug therapy: Secondary | ICD-10-CM | POA: Diagnosis not present

## 2023-04-20 DIAGNOSIS — Z86711 Personal history of pulmonary embolism: Secondary | ICD-10-CM | POA: Insufficient documentation

## 2023-04-20 DIAGNOSIS — I252 Old myocardial infarction: Secondary | ICD-10-CM | POA: Diagnosis not present

## 2023-04-20 DIAGNOSIS — S066XAA Traumatic subarachnoid hemorrhage with loss of consciousness status unknown, initial encounter: Secondary | ICD-10-CM | POA: Diagnosis not present

## 2023-04-20 DIAGNOSIS — I10 Essential (primary) hypertension: Secondary | ICD-10-CM | POA: Diagnosis not present

## 2023-04-20 LAB — CBC WITH DIFFERENTIAL (CANCER CENTER ONLY)
Abs Immature Granulocytes: 0.01 10*3/uL (ref 0.00–0.07)
Basophils Absolute: 0.1 10*3/uL (ref 0.0–0.1)
Basophils Relative: 1 %
Eosinophils Absolute: 0.1 10*3/uL (ref 0.0–0.5)
Eosinophils Relative: 1 %
HCT: 39.9 % (ref 39.0–52.0)
Hemoglobin: 12.5 g/dL — ABNORMAL LOW (ref 13.0–17.0)
Immature Granulocytes: 0 %
Lymphocytes Relative: 25 %
Lymphs Abs: 1.8 10*3/uL (ref 0.7–4.0)
MCH: 28.7 pg (ref 26.0–34.0)
MCHC: 31.3 g/dL (ref 30.0–36.0)
MCV: 91.7 fL (ref 80.0–100.0)
Monocytes Absolute: 0.6 10*3/uL (ref 0.1–1.0)
Monocytes Relative: 8 %
Neutro Abs: 4.6 10*3/uL (ref 1.7–7.7)
Neutrophils Relative %: 65 %
Platelet Count: 174 10*3/uL (ref 150–400)
RBC: 4.35 MIL/uL (ref 4.22–5.81)
RDW: 14.7 % (ref 11.5–15.5)
WBC Count: 7.1 10*3/uL (ref 4.0–10.5)
nRBC: 0 % (ref 0.0–0.2)
nRBC: 0 /100{WBCs}

## 2023-04-20 LAB — CMP (CANCER CENTER ONLY)
ALT: 24 U/L (ref 0–44)
AST: 39 U/L (ref 15–41)
Albumin: 3.5 g/dL (ref 3.5–5.0)
Alkaline Phosphatase: 238 U/L — ABNORMAL HIGH (ref 38–126)
Anion gap: 11 (ref 5–15)
BUN: 13 mg/dL (ref 8–23)
CO2: 26 mmol/L (ref 22–32)
Calcium: 8.4 mg/dL — ABNORMAL LOW (ref 8.9–10.3)
Chloride: 106 mmol/L (ref 98–111)
Creatinine: 1.18 mg/dL (ref 0.61–1.24)
GFR, Estimated: >60 mL/min (ref >60)
Glucose, Bld: 87 mg/dL (ref 70–99)
Potassium: 3.9 mmol/L (ref 3.5–5.1)
Sodium: 142 mmol/L (ref 135–145)
Total Bilirubin: 0.3 mg/dL (ref 0.0–1.2)
Total Protein: 6.2 g/dL — ABNORMAL LOW (ref 6.5–8.1)

## 2023-04-21 DIAGNOSIS — M545 Low back pain, unspecified: Secondary | ICD-10-CM | POA: Diagnosis not present

## 2023-04-21 DIAGNOSIS — R4182 Altered mental status, unspecified: Secondary | ICD-10-CM | POA: Diagnosis not present

## 2023-04-21 DIAGNOSIS — I10 Essential (primary) hypertension: Secondary | ICD-10-CM | POA: Diagnosis not present

## 2023-04-21 DIAGNOSIS — Z7982 Long term (current) use of aspirin: Secondary | ICD-10-CM | POA: Diagnosis not present

## 2023-04-21 DIAGNOSIS — Z66 Do not resuscitate: Secondary | ICD-10-CM | POA: Diagnosis not present

## 2023-04-21 DIAGNOSIS — E7800 Pure hypercholesterolemia, unspecified: Secondary | ICD-10-CM | POA: Diagnosis not present

## 2023-04-21 DIAGNOSIS — Z743 Need for continuous supervision: Secondary | ICD-10-CM | POA: Diagnosis not present

## 2023-04-21 DIAGNOSIS — S0101XA Laceration without foreign body of scalp, initial encounter: Secondary | ICD-10-CM | POA: Diagnosis not present

## 2023-04-21 DIAGNOSIS — I130 Hypertensive heart and chronic kidney disease with heart failure and stage 1 through stage 4 chronic kidney disease, or unspecified chronic kidney disease: Secondary | ICD-10-CM | POA: Diagnosis not present

## 2023-04-21 DIAGNOSIS — F32A Depression, unspecified: Secondary | ICD-10-CM | POA: Diagnosis not present

## 2023-04-21 DIAGNOSIS — I471 Supraventricular tachycardia, unspecified: Secondary | ICD-10-CM | POA: Diagnosis not present

## 2023-04-21 DIAGNOSIS — J90 Pleural effusion, not elsewhere classified: Secondary | ICD-10-CM | POA: Diagnosis not present

## 2023-04-21 DIAGNOSIS — I609 Nontraumatic subarachnoid hemorrhage, unspecified: Secondary | ICD-10-CM | POA: Diagnosis not present

## 2023-04-21 DIAGNOSIS — F419 Anxiety disorder, unspecified: Secondary | ICD-10-CM | POA: Diagnosis not present

## 2023-04-21 DIAGNOSIS — D696 Thrombocytopenia, unspecified: Secondary | ICD-10-CM | POA: Diagnosis not present

## 2023-04-21 DIAGNOSIS — N2889 Other specified disorders of kidney and ureter: Secondary | ICD-10-CM | POA: Diagnosis not present

## 2023-04-21 DIAGNOSIS — R0689 Other abnormalities of breathing: Secondary | ICD-10-CM | POA: Diagnosis not present

## 2023-04-21 DIAGNOSIS — M25512 Pain in left shoulder: Secondary | ICD-10-CM | POA: Diagnosis not present

## 2023-04-21 DIAGNOSIS — R188 Other ascites: Secondary | ICD-10-CM | POA: Diagnosis not present

## 2023-04-21 DIAGNOSIS — M546 Pain in thoracic spine: Secondary | ICD-10-CM | POA: Diagnosis not present

## 2023-04-21 DIAGNOSIS — S066X0A Traumatic subarachnoid hemorrhage without loss of consciousness, initial encounter: Secondary | ICD-10-CM | POA: Diagnosis not present

## 2023-04-21 DIAGNOSIS — Z79899 Other long term (current) drug therapy: Secondary | ICD-10-CM | POA: Diagnosis not present

## 2023-04-21 DIAGNOSIS — I444 Left anterior fascicular block: Secondary | ICD-10-CM | POA: Diagnosis not present

## 2023-04-21 DIAGNOSIS — S199XXA Unspecified injury of neck, initial encounter: Secondary | ICD-10-CM | POA: Diagnosis not present

## 2023-04-21 DIAGNOSIS — J449 Chronic obstructive pulmonary disease, unspecified: Secondary | ICD-10-CM | POA: Diagnosis not present

## 2023-04-21 DIAGNOSIS — I440 Atrioventricular block, first degree: Secondary | ICD-10-CM | POA: Diagnosis not present

## 2023-04-21 DIAGNOSIS — D62 Acute posthemorrhagic anemia: Secondary | ICD-10-CM | POA: Diagnosis not present

## 2023-04-21 DIAGNOSIS — S065X0A Traumatic subdural hemorrhage without loss of consciousness, initial encounter: Secondary | ICD-10-CM | POA: Diagnosis not present

## 2023-04-21 DIAGNOSIS — J9621 Acute and chronic respiratory failure with hypoxia: Secondary | ICD-10-CM | POA: Diagnosis not present

## 2023-04-21 DIAGNOSIS — S066XAA Traumatic subarachnoid hemorrhage with loss of consciousness status unknown, initial encounter: Secondary | ICD-10-CM | POA: Diagnosis not present

## 2023-04-23 DIAGNOSIS — I609 Nontraumatic subarachnoid hemorrhage, unspecified: Secondary | ICD-10-CM | POA: Diagnosis not present

## 2023-04-24 DIAGNOSIS — I609 Nontraumatic subarachnoid hemorrhage, unspecified: Secondary | ICD-10-CM | POA: Diagnosis not present

## 2023-04-27 DIAGNOSIS — J9611 Chronic respiratory failure with hypoxia: Secondary | ICD-10-CM | POA: Diagnosis not present

## 2023-04-27 DIAGNOSIS — Z743 Need for continuous supervision: Secondary | ICD-10-CM | POA: Diagnosis not present

## 2023-04-27 DIAGNOSIS — K7460 Unspecified cirrhosis of liver: Secondary | ICD-10-CM | POA: Diagnosis not present

## 2023-04-27 DIAGNOSIS — R059 Cough, unspecified: Secondary | ICD-10-CM | POA: Diagnosis not present

## 2023-04-27 DIAGNOSIS — J449 Chronic obstructive pulmonary disease, unspecified: Secondary | ICD-10-CM | POA: Diagnosis not present

## 2023-04-27 DIAGNOSIS — I609 Nontraumatic subarachnoid hemorrhage, unspecified: Secondary | ICD-10-CM | POA: Diagnosis not present

## 2023-04-27 DIAGNOSIS — E785 Hyperlipidemia, unspecified: Secondary | ICD-10-CM | POA: Diagnosis not present

## 2023-04-27 DIAGNOSIS — R262 Difficulty in walking, not elsewhere classified: Secondary | ICD-10-CM | POA: Diagnosis not present

## 2023-04-28 DIAGNOSIS — K7460 Unspecified cirrhosis of liver: Secondary | ICD-10-CM | POA: Diagnosis not present

## 2023-04-28 DIAGNOSIS — J9611 Chronic respiratory failure with hypoxia: Secondary | ICD-10-CM | POA: Diagnosis not present

## 2023-04-28 DIAGNOSIS — R262 Difficulty in walking, not elsewhere classified: Secondary | ICD-10-CM | POA: Diagnosis not present

## 2023-04-28 DIAGNOSIS — E785 Hyperlipidemia, unspecified: Secondary | ICD-10-CM | POA: Diagnosis not present

## 2023-04-29 ENCOUNTER — Other Ambulatory Visit: Payer: Self-pay

## 2023-05-06 NOTE — Progress Notes (Deleted)
[facility]  [address] [place],  [place]  [address] [phone]  Clinic Day:  05/06/2023  Referring physician: Bobbye Morton, *   HISTORY OF PRESENT ILLNESS:  The patient is a 83 y.o. male who I was asked to consult upon for a right renal mass.  A CT scan in mid November 2024 revealed an 11 cm right renal mass, as well as a questionable lymph node/mass in his mesentery.  A distal right ureteral mass at the ureterovesicular junction was also seen, measuring 1.1 cm.  According to the patient, he has been dealing with gross hematuria for the past year, which he finally brought to the attention of his primary care provider.  Of note, the patient has known about having a right renal lesion since at least 2017-18.  At that time, CT scans and abdominal MRIs showed a lesion on the lower pole of his right kidney that had progressively enlarged in size.  Furthermore, the mesenteric mass that was recently commented on was seen back in 2017-18.  Per inspection of his scans from 8 years ago, this mass essentially has not changed in size.  Ultimately, it was not thought to be related to his renal cancer.  The patient was very vague as to why he did not have this renal mass evaluated much earlier.  It appears he got frustrated with getting scans done periodically and ultimately stopped following up with his urologist with respect to having this right renal mass further evaluated.  He also did not let his family know about his right renal mass until recently.  He denies having any weight loss over the past 12 months.  He also denies having any right-sided costovertebral angle tenderness. The patient claims he notices gross hematuria 1-2 out of every 10 times he urinates.  PAST MEDICAL HISTORY:   Past Medical History:  Diagnosis Date  . Anxiety   . CAP (community acquired pneumonia) 12/2016  . CKD (chronic kidney disease) stage 3, GFR 30-59 ml/min (HCC)   . COPD  (chronic obstructive pulmonary disease) (HCC)   . Diastolic CHF, chronic (HCC)   . HLD (hyperlipidemia)   . HTN (hypertension)   . Ischemic heart disease   . Obesity   . OSA (obstructive sleep apnea)   . Palpitation   . Pulmonary embolism (HCC)   . PVC (premature ventricular contraction)   . SVT (supraventricular tachycardia) (HCC)   . Umbilical hernia   . WPW (Wolff-Parkinson-White syndrome)     PAST SURGICAL HISTORY:   Past Surgical History:  Procedure Laterality Date  . CARDIAC CATHETERIZATION N/A 03/16/2016   Procedure: Left Heart Cath and Coronary Angiography;  Surgeon: Lennette Bihari, MD;  Location: [facility] INVASIVE CV LAB;  Service: Cardiovascular;  Laterality: N/A;  . COLONOSCOPY WITH PROPOFOL N/A 06/11/2014   Procedure: COLONOSCOPY WITH PROPOFOL;  Surgeon: Louis Meckel, MD;  Location: [facility] ENDOSCOPY;  Service: Endoscopy;  Laterality: N/A;  . ELECTROPHYSIOLOGIC STUDY N/A 03/23/2016   Procedure: Electrophysiology Study;  Surgeon: Marinus Maw, MD;  Location: [facility] INVASIVE CV LAB;  Service: Cardiovascular;  Laterality: N/A;  . ELECTROPHYSIOLOGIC STUDY N/A 03/23/2016   Procedure: SVT Ablation;  Surgeon: Marinus Maw, MD;  Location: [facility] INVASIVE CV LAB;  Service: Cardiovascular;  Laterality: N/A;  . ESOPHAGOGASTRODUODENOSCOPY (EGD) WITH PROPOFOL N/A 06/11/2014   Procedure: ESOPHAGOGASTRODUODENOSCOPY (EGD) WITH PROPOFOL;  Surgeon: Louis Meckel, MD;  Location: [facility] ENDOSCOPY;  Service: Endoscopy;  Laterality: N/A;  . HERNIA REPAIR  CURRENT MEDICATIONS:   Current Outpatient Medications  Medication Sig Dispense Refill  . ALPRAZolam (XANAX) 1 MG tablet Take 1 tablet (1 mg total) by mouth 2 (two) times daily as needed for anxiety. 10 tablet 0  . famotidine (PEPCID) 40 MG tablet Take 40 mg by mouth daily.    . fluticasone-salmeterol (ADVAIR) 250-50 MCG/ACT AEPB Inhale 1 puff into the lungs 2 (two) times daily.    Marland Kitchen levETIRAcetam (KEPPRA) 500 MG tablet Take by mouth.    . lidocaine  4 % Apply topically.    Marland Kitchen OVER THE COUNTER MEDICATION Inhale 1 application into the lungs at bedtime. CPAP with O2    . polyethylene glycol (MIRALAX / GLYCOLAX) 17 g packet Take by mouth.    . potassium chloride SA (K-DUR,KLOR-CON) 20 MEQ tablet Take 2 tablets (40 mEq total) by mouth daily. 60 tablet 0  . rosuvastatin (CRESTOR) 10 MG tablet Take 1 tablet (10 mg total) by mouth at bedtime. 30 tablet 0  . senna (SENOKOT) 8.6 MG tablet Take by mouth.    . torsemide (DEMADEX) 20 MG tablet Take 2 tablets twice daily for 5 days, then decrease to 2 tablets once daily (40mg  total) thereafter 180 tablet 3  . verapamil (CALAN-SR) 240 MG CR tablet Take 1 tablet (240 mg total) by mouth 2 (two) times daily. 180 tablet 3   No current facility-administered medications for this visit.    ALLERGIES:   Allergies  Allergen Reactions  . Eliquis [Apixaban] Other (See Comments)    Bleeding event  . Codeine Other (See Comments)    Cant remember    FAMILY HISTORY:   Family History  Problem Relation Age of Onset  . CVA Mother   . Cancer Father        UNK PRIMARY  . Liver cancer Other   . Heart disease Other     SOCIAL HISTORY:  The patient was born and raised in [place].  He currently lives in the [place] community.  He is divorced, with 2 children and 2 grandchildren.  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  He smoked as much as 2 packs of cigarettes daily for 40 years before quitting a handful of years ago.  He formerly drank alcohol on rare occasions.  REVIEW OF SYSTEMS:  Review of Systems  Constitutional:  Negative for fatigue, fever and unexpected weight change.  HENT:   Positive for hearing loss.   Respiratory:  Positive for shortness of breath. Negative for chest tightness, cough and hemoptysis.   Cardiovascular:  Negative for chest pain and palpitations.  Gastrointestinal:  Negative for abdominal distention, abdominal  pain, blood in stool, constipation, diarrhea, nausea and vomiting.  Genitourinary:  Positive for bladder incontinence and hematuria. Negative for dysuria and frequency.   Musculoskeletal:  Positive for back pain. Negative for arthralgias and myalgias.  Skin:  Negative for itching and rash.  Neurological:  Negative for dizziness, headaches and light-headedness.  Psychiatric/Behavioral:  Negative for depression and suicidal ideas. The patient is nervous/anxious.      PHYSICAL EXAM:  There were no vitals taken for this visit. Wt Readings from Last 3 Encounters:  10/22/22 243 lb 3.2 oz (110.3 kg)  10/27/21 246 lb (111.6 kg)  04/25/20 267 lb (121.1 kg)   There is no height or weight on file to calculate BMI. Performance status (ECOG): 3 - Symptomatic, >50% confined to bed Physical Exam Constitutional:  Appearance: Normal appearance. He is obese. He is ill-appearing.     Comments: An obese, chronically ill-appearing older gentleman who is wearing oxygen per nasal cannula  HENT:     Mouth/Throat:     Mouth: Mucous membranes are moist.     Pharynx: Oropharynx is clear. No oropharyngeal exudate or posterior oropharyngeal erythema.  Cardiovascular:     Rate and Rhythm: Normal rate and regular rhythm.     Heart sounds: No murmur heard.    No friction rub. No gallop.  Pulmonary:     Effort: Pulmonary effort is normal. No respiratory distress.     Breath sounds: Decreased breath sounds present. No wheezing, rhonchi or rales.  Abdominal:     General: Bowel sounds are normal. There is no distension.     Palpations: Abdomen is soft. There is no mass.     Tenderness: There is no abdominal tenderness.  Musculoskeletal:        General: No swelling.     Right lower leg: No edema.     Left lower leg: No edema.  Lymphadenopathy:     Cervical: No cervical adenopathy.     Upper Body:     Right upper body: No supraclavicular or axillary adenopathy.     Left upper body: No supraclavicular or  axillary adenopathy.     Lower Body: No right inguinal adenopathy. No left inguinal adenopathy.  Skin:    General: Skin is warm.     Coloration: Skin is not jaundiced.     Findings: No lesion or rash.  Neurological:     General: No focal deficit present.     Mental Status: He is alert and oriented to person, place, and time. Mental status is at baseline.  Psychiatric:        Mood and Affect: Mood normal.        Behavior: Behavior normal.        Thought Content: Thought content normal.   LABS:      Latest Ref Rng & Units 04/20/2023    2:33 PM 03/01/2023    5:11 PM 03/01/2023    4:30 PM  CBC  WBC 4.0 - 10.5 K/uL 7.1   11.3   Hemoglobin 13.0 - 17.0 g/dL 76.1  60.7  37.1   Hematocrit 39.0 - 52.0 % 39.9  46.0  44.4   Platelets 150 - 400 K/uL 174   155       Latest Ref Rng & Units 04/20/2023    2:33 PM 03/01/2023    5:11 PM 03/01/2023    4:30 PM  CMP  Glucose 70 - 99 mg/dL 87  062  694   BUN 8 - 23 mg/dL 13  22  18    Creatinine 0.61 - 1.24 mg/dL 8.54  6.27  0.35   Sodium 135 - 145 mmol/L 142  141  139   Potassium 3.5 - 5.1 mmol/L 3.9  4.3  4.4   Chloride 98 - 111 mmol/L 106  103  104   CO2 22 - 32 mmol/L 26   24   Calcium 8.9 - 10.3 mg/dL 8.4   8.3   Total Protein 6.5 - 8.1 g/dL 6.2     Total Bilirubin 0.0 - 1.2 mg/dL 0.3     Alkaline Phos 38 - 126 U/L 238     AST 15 - 41 U/L 39     ALT 0 - 44 U/L 24       ASSESSMENT & PLAN:  An 83  y.o. male who I was asked to consult upon for what appears to be locally advanced right-sided renal cell carcinoma.  In clinic today, I went over all of his recent CT scans with him, as well as CT scans/MRIs done back in 2017-18.  I wanted to make sure that both he and his family saw this renal lesion had been seen years ago, which has progressively grown over time.  His most recent scans also showed a lesion at his right ureterovesicular junction which may represent a drop metastasis from his much larger renal cell cancer.  As mentioned previously,  the right mesenteric mass seen on his recent scans has been essentially the same size for the past 7-8 years.  This ultimately may have nothing to do with his right renal cell cancer at all.  Furthermore, it may very well be benign in nature.  Most of this visit was spent trying to get an idea of how aggressive/involved the patient wants me to be with his workup and management.  Ultimately, he is not sure what he wishes to do.  We did come to a consensus to at least get a PET scan done to see if he has any obvious areas of distant metastasis that may not be getting picked up on CT scans.  A PET scan will be done on January 23rd; I will see him back the following day to go over the images.  Possibilities for future management include localized therapy, such as embolization, to address the hematuria coming from his right renal mass.  Other possibilities would be palliative radiation or even a palliative right nephrectomy.  The fact that his cancer has not had widespread metastasis despite being present for at least 7-8 years suggests that its overall biology may not be very aggressive.  Moving forward, the patient understands it will be up to him as to how much or how little he wishes to do with respect to his disease evaluation/management.  The patient understands all the plans discussed today and is in agreement with them.  I do appreciate Street, [name], * for his new consult.   Danthony Kendrix Kirby Funk, MD

## 2023-05-07 ENCOUNTER — Inpatient Hospital Stay: Payer: Medicare HMO | Admitting: Oncology

## 2023-05-12 DIAGNOSIS — C641 Malignant neoplasm of right kidney, except renal pelvis: Secondary | ICD-10-CM | POA: Diagnosis not present

## 2023-05-12 DIAGNOSIS — J441 Chronic obstructive pulmonary disease with (acute) exacerbation: Secondary | ICD-10-CM | POA: Diagnosis not present

## 2023-05-12 DIAGNOSIS — J90 Pleural effusion, not elsewhere classified: Secondary | ICD-10-CM | POA: Diagnosis not present

## 2023-05-12 DIAGNOSIS — J159 Unspecified bacterial pneumonia: Secondary | ICD-10-CM | POA: Diagnosis not present

## 2023-05-12 DIAGNOSIS — D649 Anemia, unspecified: Secondary | ICD-10-CM | POA: Diagnosis not present

## 2023-05-12 DIAGNOSIS — N179 Acute kidney failure, unspecified: Secondary | ICD-10-CM | POA: Diagnosis not present

## 2023-05-12 DIAGNOSIS — J440 Chronic obstructive pulmonary disease with acute lower respiratory infection: Secondary | ICD-10-CM | POA: Diagnosis not present

## 2023-05-12 DIAGNOSIS — J189 Pneumonia, unspecified organism: Secondary | ICD-10-CM | POA: Diagnosis not present

## 2023-05-12 DIAGNOSIS — I10 Essential (primary) hypertension: Secondary | ICD-10-CM | POA: Diagnosis not present

## 2023-05-12 DIAGNOSIS — J9621 Acute and chronic respiratory failure with hypoxia: Secondary | ICD-10-CM | POA: Diagnosis not present

## 2023-05-12 DIAGNOSIS — R918 Other nonspecific abnormal finding of lung field: Secondary | ICD-10-CM | POA: Diagnosis not present

## 2023-05-12 DIAGNOSIS — E860 Dehydration: Secondary | ICD-10-CM | POA: Diagnosis not present

## 2023-05-13 DIAGNOSIS — J159 Unspecified bacterial pneumonia: Secondary | ICD-10-CM | POA: Diagnosis not present

## 2023-05-13 DIAGNOSIS — J9621 Acute and chronic respiratory failure with hypoxia: Secondary | ICD-10-CM | POA: Diagnosis not present

## 2023-05-13 DIAGNOSIS — J441 Chronic obstructive pulmonary disease with (acute) exacerbation: Secondary | ICD-10-CM | POA: Diagnosis not present

## 2023-05-14 DIAGNOSIS — J9621 Acute and chronic respiratory failure with hypoxia: Secondary | ICD-10-CM | POA: Diagnosis not present

## 2023-05-14 DIAGNOSIS — J159 Unspecified bacterial pneumonia: Secondary | ICD-10-CM | POA: Diagnosis not present

## 2023-05-14 DIAGNOSIS — J441 Chronic obstructive pulmonary disease with (acute) exacerbation: Secondary | ICD-10-CM | POA: Diagnosis not present

## 2023-05-17 DIAGNOSIS — J9611 Chronic respiratory failure with hypoxia: Secondary | ICD-10-CM | POA: Diagnosis not present

## 2023-05-17 DIAGNOSIS — I872 Venous insufficiency (chronic) (peripheral): Secondary | ICD-10-CM | POA: Diagnosis not present

## 2023-05-17 DIAGNOSIS — K6389 Other specified diseases of intestine: Secondary | ICD-10-CM | POA: Diagnosis not present

## 2023-05-17 DIAGNOSIS — C649 Malignant neoplasm of unspecified kidney, except renal pelvis: Secondary | ICD-10-CM | POA: Diagnosis not present

## 2023-05-17 DIAGNOSIS — E039 Hypothyroidism, unspecified: Secondary | ICD-10-CM | POA: Diagnosis not present

## 2023-05-17 DIAGNOSIS — Z9981 Dependence on supplemental oxygen: Secondary | ICD-10-CM | POA: Diagnosis not present

## 2023-05-17 DIAGNOSIS — J982 Interstitial emphysema: Secondary | ICD-10-CM | POA: Diagnosis not present

## 2023-05-17 DIAGNOSIS — J8410 Pulmonary fibrosis, unspecified: Secondary | ICD-10-CM | POA: Diagnosis not present

## 2023-05-17 DIAGNOSIS — S066X9S Traumatic subarachnoid hemorrhage with loss of consciousness of unspecified duration, sequela: Secondary | ICD-10-CM | POA: Diagnosis not present

## 2023-05-18 DIAGNOSIS — J8410 Pulmonary fibrosis, unspecified: Secondary | ICD-10-CM | POA: Diagnosis not present

## 2023-05-18 DIAGNOSIS — C641 Malignant neoplasm of right kidney, except renal pelvis: Secondary | ICD-10-CM | POA: Diagnosis not present

## 2023-05-18 DIAGNOSIS — J441 Chronic obstructive pulmonary disease with (acute) exacerbation: Secondary | ICD-10-CM | POA: Diagnosis not present

## 2023-05-18 DIAGNOSIS — N179 Acute kidney failure, unspecified: Secondary | ICD-10-CM | POA: Diagnosis not present

## 2023-05-18 DIAGNOSIS — J159 Unspecified bacterial pneumonia: Secondary | ICD-10-CM | POA: Diagnosis not present

## 2023-05-18 DIAGNOSIS — J440 Chronic obstructive pulmonary disease with acute lower respiratory infection: Secondary | ICD-10-CM | POA: Diagnosis not present

## 2023-05-18 DIAGNOSIS — J9621 Acute and chronic respiratory failure with hypoxia: Secondary | ICD-10-CM | POA: Diagnosis not present

## 2023-05-18 DIAGNOSIS — D630 Anemia in neoplastic disease: Secondary | ICD-10-CM | POA: Diagnosis not present

## 2023-05-19 DIAGNOSIS — D630 Anemia in neoplastic disease: Secondary | ICD-10-CM | POA: Diagnosis not present

## 2023-05-19 DIAGNOSIS — C641 Malignant neoplasm of right kidney, except renal pelvis: Secondary | ICD-10-CM | POA: Diagnosis not present

## 2023-05-19 DIAGNOSIS — J8410 Pulmonary fibrosis, unspecified: Secondary | ICD-10-CM | POA: Diagnosis not present

## 2023-05-19 DIAGNOSIS — J9621 Acute and chronic respiratory failure with hypoxia: Secondary | ICD-10-CM | POA: Diagnosis not present

## 2023-05-19 DIAGNOSIS — J440 Chronic obstructive pulmonary disease with acute lower respiratory infection: Secondary | ICD-10-CM | POA: Diagnosis not present

## 2023-05-19 DIAGNOSIS — N179 Acute kidney failure, unspecified: Secondary | ICD-10-CM | POA: Diagnosis not present

## 2023-05-19 DIAGNOSIS — J159 Unspecified bacterial pneumonia: Secondary | ICD-10-CM | POA: Diagnosis not present

## 2023-05-19 DIAGNOSIS — J441 Chronic obstructive pulmonary disease with (acute) exacerbation: Secondary | ICD-10-CM | POA: Diagnosis not present

## 2023-05-21 DIAGNOSIS — J441 Chronic obstructive pulmonary disease with (acute) exacerbation: Secondary | ICD-10-CM | POA: Diagnosis not present

## 2023-05-21 DIAGNOSIS — J8410 Pulmonary fibrosis, unspecified: Secondary | ICD-10-CM | POA: Diagnosis not present

## 2023-05-21 DIAGNOSIS — J440 Chronic obstructive pulmonary disease with acute lower respiratory infection: Secondary | ICD-10-CM | POA: Diagnosis not present

## 2023-05-21 DIAGNOSIS — J9621 Acute and chronic respiratory failure with hypoxia: Secondary | ICD-10-CM | POA: Diagnosis not present

## 2023-05-21 DIAGNOSIS — C641 Malignant neoplasm of right kidney, except renal pelvis: Secondary | ICD-10-CM | POA: Diagnosis not present

## 2023-05-21 DIAGNOSIS — D630 Anemia in neoplastic disease: Secondary | ICD-10-CM | POA: Diagnosis not present

## 2023-05-21 DIAGNOSIS — N179 Acute kidney failure, unspecified: Secondary | ICD-10-CM | POA: Diagnosis not present

## 2023-05-21 DIAGNOSIS — J159 Unspecified bacterial pneumonia: Secondary | ICD-10-CM | POA: Diagnosis not present

## 2023-05-25 DIAGNOSIS — N179 Acute kidney failure, unspecified: Secondary | ICD-10-CM | POA: Diagnosis not present

## 2023-05-25 DIAGNOSIS — D630 Anemia in neoplastic disease: Secondary | ICD-10-CM | POA: Diagnosis not present

## 2023-05-25 DIAGNOSIS — J8410 Pulmonary fibrosis, unspecified: Secondary | ICD-10-CM | POA: Diagnosis not present

## 2023-05-25 DIAGNOSIS — J441 Chronic obstructive pulmonary disease with (acute) exacerbation: Secondary | ICD-10-CM | POA: Diagnosis not present

## 2023-05-25 DIAGNOSIS — J159 Unspecified bacterial pneumonia: Secondary | ICD-10-CM | POA: Diagnosis not present

## 2023-05-25 DIAGNOSIS — J9621 Acute and chronic respiratory failure with hypoxia: Secondary | ICD-10-CM | POA: Diagnosis not present

## 2023-05-25 DIAGNOSIS — J440 Chronic obstructive pulmonary disease with acute lower respiratory infection: Secondary | ICD-10-CM | POA: Diagnosis not present

## 2023-05-25 DIAGNOSIS — C641 Malignant neoplasm of right kidney, except renal pelvis: Secondary | ICD-10-CM | POA: Diagnosis not present

## 2023-05-26 DIAGNOSIS — J159 Unspecified bacterial pneumonia: Secondary | ICD-10-CM | POA: Diagnosis not present

## 2023-05-26 DIAGNOSIS — N179 Acute kidney failure, unspecified: Secondary | ICD-10-CM | POA: Diagnosis not present

## 2023-05-26 DIAGNOSIS — C641 Malignant neoplasm of right kidney, except renal pelvis: Secondary | ICD-10-CM | POA: Diagnosis not present

## 2023-05-26 DIAGNOSIS — J9621 Acute and chronic respiratory failure with hypoxia: Secondary | ICD-10-CM | POA: Diagnosis not present

## 2023-05-26 DIAGNOSIS — J441 Chronic obstructive pulmonary disease with (acute) exacerbation: Secondary | ICD-10-CM | POA: Diagnosis not present

## 2023-05-26 DIAGNOSIS — D630 Anemia in neoplastic disease: Secondary | ICD-10-CM | POA: Diagnosis not present

## 2023-05-26 DIAGNOSIS — J8410 Pulmonary fibrosis, unspecified: Secondary | ICD-10-CM | POA: Diagnosis not present

## 2023-05-26 DIAGNOSIS — J440 Chronic obstructive pulmonary disease with acute lower respiratory infection: Secondary | ICD-10-CM | POA: Diagnosis not present

## 2023-05-27 DIAGNOSIS — C641 Malignant neoplasm of right kidney, except renal pelvis: Secondary | ICD-10-CM | POA: Diagnosis not present

## 2023-05-27 DIAGNOSIS — D630 Anemia in neoplastic disease: Secondary | ICD-10-CM | POA: Diagnosis not present

## 2023-05-27 DIAGNOSIS — J441 Chronic obstructive pulmonary disease with (acute) exacerbation: Secondary | ICD-10-CM | POA: Diagnosis not present

## 2023-05-27 DIAGNOSIS — J440 Chronic obstructive pulmonary disease with acute lower respiratory infection: Secondary | ICD-10-CM | POA: Diagnosis not present

## 2023-05-27 DIAGNOSIS — J159 Unspecified bacterial pneumonia: Secondary | ICD-10-CM | POA: Diagnosis not present

## 2023-05-27 DIAGNOSIS — J8410 Pulmonary fibrosis, unspecified: Secondary | ICD-10-CM | POA: Diagnosis not present

## 2023-05-27 DIAGNOSIS — J9621 Acute and chronic respiratory failure with hypoxia: Secondary | ICD-10-CM | POA: Diagnosis not present

## 2023-05-27 DIAGNOSIS — N179 Acute kidney failure, unspecified: Secondary | ICD-10-CM | POA: Diagnosis not present

## 2023-05-28 DIAGNOSIS — C641 Malignant neoplasm of right kidney, except renal pelvis: Secondary | ICD-10-CM | POA: Diagnosis not present

## 2023-05-28 DIAGNOSIS — J441 Chronic obstructive pulmonary disease with (acute) exacerbation: Secondary | ICD-10-CM | POA: Diagnosis not present

## 2023-05-28 DIAGNOSIS — J440 Chronic obstructive pulmonary disease with acute lower respiratory infection: Secondary | ICD-10-CM | POA: Diagnosis not present

## 2023-05-28 DIAGNOSIS — J9621 Acute and chronic respiratory failure with hypoxia: Secondary | ICD-10-CM | POA: Diagnosis not present

## 2023-05-28 DIAGNOSIS — D630 Anemia in neoplastic disease: Secondary | ICD-10-CM | POA: Diagnosis not present

## 2023-05-28 DIAGNOSIS — J159 Unspecified bacterial pneumonia: Secondary | ICD-10-CM | POA: Diagnosis not present

## 2023-05-28 DIAGNOSIS — N179 Acute kidney failure, unspecified: Secondary | ICD-10-CM | POA: Diagnosis not present

## 2023-05-28 DIAGNOSIS — J8410 Pulmonary fibrosis, unspecified: Secondary | ICD-10-CM | POA: Diagnosis not present

## 2023-06-01 DIAGNOSIS — J8410 Pulmonary fibrosis, unspecified: Secondary | ICD-10-CM | POA: Diagnosis not present

## 2023-06-01 DIAGNOSIS — N179 Acute kidney failure, unspecified: Secondary | ICD-10-CM | POA: Diagnosis not present

## 2023-06-01 DIAGNOSIS — J159 Unspecified bacterial pneumonia: Secondary | ICD-10-CM | POA: Diagnosis not present

## 2023-06-01 DIAGNOSIS — C641 Malignant neoplasm of right kidney, except renal pelvis: Secondary | ICD-10-CM | POA: Diagnosis not present

## 2023-06-01 DIAGNOSIS — J440 Chronic obstructive pulmonary disease with acute lower respiratory infection: Secondary | ICD-10-CM | POA: Diagnosis not present

## 2023-06-01 DIAGNOSIS — J9621 Acute and chronic respiratory failure with hypoxia: Secondary | ICD-10-CM | POA: Diagnosis not present

## 2023-06-01 DIAGNOSIS — D630 Anemia in neoplastic disease: Secondary | ICD-10-CM | POA: Diagnosis not present

## 2023-06-01 DIAGNOSIS — J441 Chronic obstructive pulmonary disease with (acute) exacerbation: Secondary | ICD-10-CM | POA: Diagnosis not present

## 2023-06-02 DIAGNOSIS — J8410 Pulmonary fibrosis, unspecified: Secondary | ICD-10-CM | POA: Diagnosis not present

## 2023-06-02 DIAGNOSIS — J440 Chronic obstructive pulmonary disease with acute lower respiratory infection: Secondary | ICD-10-CM | POA: Diagnosis not present

## 2023-06-02 DIAGNOSIS — N179 Acute kidney failure, unspecified: Secondary | ICD-10-CM | POA: Diagnosis not present

## 2023-06-02 DIAGNOSIS — J9621 Acute and chronic respiratory failure with hypoxia: Secondary | ICD-10-CM | POA: Diagnosis not present

## 2023-06-02 DIAGNOSIS — J159 Unspecified bacterial pneumonia: Secondary | ICD-10-CM | POA: Diagnosis not present

## 2023-06-02 DIAGNOSIS — C641 Malignant neoplasm of right kidney, except renal pelvis: Secondary | ICD-10-CM | POA: Diagnosis not present

## 2023-06-02 DIAGNOSIS — D630 Anemia in neoplastic disease: Secondary | ICD-10-CM | POA: Diagnosis not present

## 2023-06-02 DIAGNOSIS — J441 Chronic obstructive pulmonary disease with (acute) exacerbation: Secondary | ICD-10-CM | POA: Diagnosis not present

## 2023-06-04 DIAGNOSIS — N179 Acute kidney failure, unspecified: Secondary | ICD-10-CM | POA: Diagnosis not present

## 2023-06-04 DIAGNOSIS — D630 Anemia in neoplastic disease: Secondary | ICD-10-CM | POA: Diagnosis not present

## 2023-06-04 DIAGNOSIS — J8410 Pulmonary fibrosis, unspecified: Secondary | ICD-10-CM | POA: Diagnosis not present

## 2023-06-04 DIAGNOSIS — J9621 Acute and chronic respiratory failure with hypoxia: Secondary | ICD-10-CM | POA: Diagnosis not present

## 2023-06-04 DIAGNOSIS — J440 Chronic obstructive pulmonary disease with acute lower respiratory infection: Secondary | ICD-10-CM | POA: Diagnosis not present

## 2023-06-04 DIAGNOSIS — J159 Unspecified bacterial pneumonia: Secondary | ICD-10-CM | POA: Diagnosis not present

## 2023-06-04 DIAGNOSIS — C641 Malignant neoplasm of right kidney, except renal pelvis: Secondary | ICD-10-CM | POA: Diagnosis not present

## 2023-06-04 DIAGNOSIS — J441 Chronic obstructive pulmonary disease with (acute) exacerbation: Secondary | ICD-10-CM | POA: Diagnosis not present

## 2023-06-07 DIAGNOSIS — J9621 Acute and chronic respiratory failure with hypoxia: Secondary | ICD-10-CM | POA: Diagnosis not present

## 2023-06-07 DIAGNOSIS — C641 Malignant neoplasm of right kidney, except renal pelvis: Secondary | ICD-10-CM | POA: Diagnosis not present

## 2023-06-07 DIAGNOSIS — J159 Unspecified bacterial pneumonia: Secondary | ICD-10-CM | POA: Diagnosis not present

## 2023-06-07 DIAGNOSIS — J441 Chronic obstructive pulmonary disease with (acute) exacerbation: Secondary | ICD-10-CM | POA: Diagnosis not present

## 2023-06-07 DIAGNOSIS — J440 Chronic obstructive pulmonary disease with acute lower respiratory infection: Secondary | ICD-10-CM | POA: Diagnosis not present

## 2023-06-07 DIAGNOSIS — J8410 Pulmonary fibrosis, unspecified: Secondary | ICD-10-CM | POA: Diagnosis not present

## 2023-06-07 DIAGNOSIS — D630 Anemia in neoplastic disease: Secondary | ICD-10-CM | POA: Diagnosis not present

## 2023-06-07 DIAGNOSIS — N179 Acute kidney failure, unspecified: Secondary | ICD-10-CM | POA: Diagnosis not present

## 2023-06-08 DIAGNOSIS — J159 Unspecified bacterial pneumonia: Secondary | ICD-10-CM | POA: Diagnosis not present

## 2023-06-08 DIAGNOSIS — C641 Malignant neoplasm of right kidney, except renal pelvis: Secondary | ICD-10-CM | POA: Diagnosis not present

## 2023-06-08 DIAGNOSIS — J440 Chronic obstructive pulmonary disease with acute lower respiratory infection: Secondary | ICD-10-CM | POA: Diagnosis not present

## 2023-06-08 DIAGNOSIS — J8410 Pulmonary fibrosis, unspecified: Secondary | ICD-10-CM | POA: Diagnosis not present

## 2023-06-08 DIAGNOSIS — D630 Anemia in neoplastic disease: Secondary | ICD-10-CM | POA: Diagnosis not present

## 2023-06-08 DIAGNOSIS — J441 Chronic obstructive pulmonary disease with (acute) exacerbation: Secondary | ICD-10-CM | POA: Diagnosis not present

## 2023-06-08 DIAGNOSIS — J9621 Acute and chronic respiratory failure with hypoxia: Secondary | ICD-10-CM | POA: Diagnosis not present

## 2023-06-08 DIAGNOSIS — N179 Acute kidney failure, unspecified: Secondary | ICD-10-CM | POA: Diagnosis not present

## 2023-06-09 DIAGNOSIS — J441 Chronic obstructive pulmonary disease with (acute) exacerbation: Secondary | ICD-10-CM | POA: Diagnosis not present

## 2023-06-09 DIAGNOSIS — J440 Chronic obstructive pulmonary disease with acute lower respiratory infection: Secondary | ICD-10-CM | POA: Diagnosis not present

## 2023-06-09 DIAGNOSIS — J159 Unspecified bacterial pneumonia: Secondary | ICD-10-CM | POA: Diagnosis not present

## 2023-06-09 DIAGNOSIS — C641 Malignant neoplasm of right kidney, except renal pelvis: Secondary | ICD-10-CM | POA: Diagnosis not present

## 2023-06-09 DIAGNOSIS — N179 Acute kidney failure, unspecified: Secondary | ICD-10-CM | POA: Diagnosis not present

## 2023-06-09 DIAGNOSIS — J8410 Pulmonary fibrosis, unspecified: Secondary | ICD-10-CM | POA: Diagnosis not present

## 2023-06-09 DIAGNOSIS — J9621 Acute and chronic respiratory failure with hypoxia: Secondary | ICD-10-CM | POA: Diagnosis not present

## 2023-06-09 DIAGNOSIS — D630 Anemia in neoplastic disease: Secondary | ICD-10-CM | POA: Diagnosis not present

## 2023-06-10 DIAGNOSIS — J440 Chronic obstructive pulmonary disease with acute lower respiratory infection: Secondary | ICD-10-CM | POA: Diagnosis not present

## 2023-06-10 DIAGNOSIS — J9621 Acute and chronic respiratory failure with hypoxia: Secondary | ICD-10-CM | POA: Diagnosis not present

## 2023-06-10 DIAGNOSIS — N179 Acute kidney failure, unspecified: Secondary | ICD-10-CM | POA: Diagnosis not present

## 2023-06-10 DIAGNOSIS — D630 Anemia in neoplastic disease: Secondary | ICD-10-CM | POA: Diagnosis not present

## 2023-06-10 DIAGNOSIS — J441 Chronic obstructive pulmonary disease with (acute) exacerbation: Secondary | ICD-10-CM | POA: Diagnosis not present

## 2023-06-10 DIAGNOSIS — J159 Unspecified bacterial pneumonia: Secondary | ICD-10-CM | POA: Diagnosis not present

## 2023-06-10 DIAGNOSIS — C641 Malignant neoplasm of right kidney, except renal pelvis: Secondary | ICD-10-CM | POA: Diagnosis not present

## 2023-06-10 DIAGNOSIS — J8410 Pulmonary fibrosis, unspecified: Secondary | ICD-10-CM | POA: Diagnosis not present

## 2023-06-14 DIAGNOSIS — D630 Anemia in neoplastic disease: Secondary | ICD-10-CM | POA: Diagnosis not present

## 2023-06-14 DIAGNOSIS — C641 Malignant neoplasm of right kidney, except renal pelvis: Secondary | ICD-10-CM | POA: Diagnosis not present

## 2023-06-14 DIAGNOSIS — J9621 Acute and chronic respiratory failure with hypoxia: Secondary | ICD-10-CM | POA: Diagnosis not present

## 2023-06-14 DIAGNOSIS — J159 Unspecified bacterial pneumonia: Secondary | ICD-10-CM | POA: Diagnosis not present

## 2023-06-14 DIAGNOSIS — J441 Chronic obstructive pulmonary disease with (acute) exacerbation: Secondary | ICD-10-CM | POA: Diagnosis not present

## 2023-06-14 DIAGNOSIS — J440 Chronic obstructive pulmonary disease with acute lower respiratory infection: Secondary | ICD-10-CM | POA: Diagnosis not present

## 2023-06-14 DIAGNOSIS — N179 Acute kidney failure, unspecified: Secondary | ICD-10-CM | POA: Diagnosis not present

## 2023-06-14 DIAGNOSIS — J8410 Pulmonary fibrosis, unspecified: Secondary | ICD-10-CM | POA: Diagnosis not present

## 2023-06-15 DIAGNOSIS — J441 Chronic obstructive pulmonary disease with (acute) exacerbation: Secondary | ICD-10-CM | POA: Diagnosis not present

## 2023-06-15 DIAGNOSIS — J440 Chronic obstructive pulmonary disease with acute lower respiratory infection: Secondary | ICD-10-CM | POA: Diagnosis not present

## 2023-06-15 DIAGNOSIS — D630 Anemia in neoplastic disease: Secondary | ICD-10-CM | POA: Diagnosis not present

## 2023-06-15 DIAGNOSIS — J9621 Acute and chronic respiratory failure with hypoxia: Secondary | ICD-10-CM | POA: Diagnosis not present

## 2023-06-15 DIAGNOSIS — J8410 Pulmonary fibrosis, unspecified: Secondary | ICD-10-CM | POA: Diagnosis not present

## 2023-06-15 DIAGNOSIS — N179 Acute kidney failure, unspecified: Secondary | ICD-10-CM | POA: Diagnosis not present

## 2023-06-15 DIAGNOSIS — C641 Malignant neoplasm of right kidney, except renal pelvis: Secondary | ICD-10-CM | POA: Diagnosis not present

## 2023-06-15 DIAGNOSIS — J159 Unspecified bacterial pneumonia: Secondary | ICD-10-CM | POA: Diagnosis not present

## 2023-06-16 DIAGNOSIS — N179 Acute kidney failure, unspecified: Secondary | ICD-10-CM | POA: Diagnosis not present

## 2023-06-16 DIAGNOSIS — C641 Malignant neoplasm of right kidney, except renal pelvis: Secondary | ICD-10-CM | POA: Diagnosis not present

## 2023-06-16 DIAGNOSIS — J440 Chronic obstructive pulmonary disease with acute lower respiratory infection: Secondary | ICD-10-CM | POA: Diagnosis not present

## 2023-06-16 DIAGNOSIS — J8410 Pulmonary fibrosis, unspecified: Secondary | ICD-10-CM | POA: Diagnosis not present

## 2023-06-16 DIAGNOSIS — J441 Chronic obstructive pulmonary disease with (acute) exacerbation: Secondary | ICD-10-CM | POA: Diagnosis not present

## 2023-06-16 DIAGNOSIS — J9621 Acute and chronic respiratory failure with hypoxia: Secondary | ICD-10-CM | POA: Diagnosis not present

## 2023-06-16 DIAGNOSIS — D630 Anemia in neoplastic disease: Secondary | ICD-10-CM | POA: Diagnosis not present

## 2023-06-16 DIAGNOSIS — J159 Unspecified bacterial pneumonia: Secondary | ICD-10-CM | POA: Diagnosis not present

## 2023-06-17 DIAGNOSIS — J159 Unspecified bacterial pneumonia: Secondary | ICD-10-CM | POA: Diagnosis not present

## 2023-06-17 DIAGNOSIS — J441 Chronic obstructive pulmonary disease with (acute) exacerbation: Secondary | ICD-10-CM | POA: Diagnosis not present

## 2023-06-17 DIAGNOSIS — D630 Anemia in neoplastic disease: Secondary | ICD-10-CM | POA: Diagnosis not present

## 2023-06-17 DIAGNOSIS — J8410 Pulmonary fibrosis, unspecified: Secondary | ICD-10-CM | POA: Diagnosis not present

## 2023-06-17 DIAGNOSIS — J440 Chronic obstructive pulmonary disease with acute lower respiratory infection: Secondary | ICD-10-CM | POA: Diagnosis not present

## 2023-06-17 DIAGNOSIS — C641 Malignant neoplasm of right kidney, except renal pelvis: Secondary | ICD-10-CM | POA: Diagnosis not present

## 2023-06-17 DIAGNOSIS — J9621 Acute and chronic respiratory failure with hypoxia: Secondary | ICD-10-CM | POA: Diagnosis not present

## 2023-06-17 DIAGNOSIS — N179 Acute kidney failure, unspecified: Secondary | ICD-10-CM | POA: Diagnosis not present

## 2023-06-21 ENCOUNTER — Encounter (HOSPITAL_COMMUNITY): Payer: Self-pay

## 2023-06-21 ENCOUNTER — Inpatient Hospital Stay (HOSPITAL_COMMUNITY)
Admission: EM | Admit: 2023-06-21 | Discharge: 2023-07-07 | DRG: 291 | Disposition: A | Discharge status: Hospice | Attending: Internal Medicine | Admitting: Internal Medicine

## 2023-06-21 ENCOUNTER — Other Ambulatory Visit: Payer: Self-pay

## 2023-06-21 ENCOUNTER — Emergency Department (HOSPITAL_COMMUNITY)

## 2023-06-21 DIAGNOSIS — E785 Hyperlipidemia, unspecified: Secondary | ICD-10-CM | POA: Diagnosis present

## 2023-06-21 DIAGNOSIS — Z87891 Personal history of nicotine dependence: Secondary | ICD-10-CM

## 2023-06-21 DIAGNOSIS — Z79890 Hormone replacement therapy (postmenopausal): Secondary | ICD-10-CM

## 2023-06-21 DIAGNOSIS — I5043 Acute on chronic combined systolic (congestive) and diastolic (congestive) heart failure: Secondary | ICD-10-CM | POA: Diagnosis not present

## 2023-06-21 DIAGNOSIS — J9621 Acute and chronic respiratory failure with hypoxia: Secondary | ICD-10-CM | POA: Diagnosis present

## 2023-06-21 DIAGNOSIS — E876 Hypokalemia: Secondary | ICD-10-CM | POA: Diagnosis not present

## 2023-06-21 DIAGNOSIS — E039 Hypothyroidism, unspecified: Secondary | ICD-10-CM | POA: Diagnosis present

## 2023-06-21 DIAGNOSIS — C649 Malignant neoplasm of unspecified kidney, except renal pelvis: Secondary | ICD-10-CM

## 2023-06-21 DIAGNOSIS — R600 Localized edema: Secondary | ICD-10-CM | POA: Diagnosis present

## 2023-06-21 DIAGNOSIS — Z515 Encounter for palliative care: Secondary | ICD-10-CM | POA: Diagnosis not present

## 2023-06-21 DIAGNOSIS — R0602 Shortness of breath: Secondary | ICD-10-CM | POA: Diagnosis not present

## 2023-06-21 DIAGNOSIS — R180 Malignant ascites: Secondary | ICD-10-CM | POA: Diagnosis not present

## 2023-06-21 DIAGNOSIS — I509 Heart failure, unspecified: Secondary | ICD-10-CM | POA: Diagnosis not present

## 2023-06-21 DIAGNOSIS — G4733 Obstructive sleep apnea (adult) (pediatric): Secondary | ICD-10-CM | POA: Diagnosis present

## 2023-06-21 DIAGNOSIS — D6959 Other secondary thrombocytopenia: Secondary | ICD-10-CM | POA: Diagnosis present

## 2023-06-21 DIAGNOSIS — Z8701 Personal history of pneumonia (recurrent): Secondary | ICD-10-CM

## 2023-06-21 DIAGNOSIS — Z1152 Encounter for screening for COVID-19: Secondary | ICD-10-CM

## 2023-06-21 DIAGNOSIS — Z85528 Personal history of other malignant neoplasm of kidney: Secondary | ICD-10-CM

## 2023-06-21 DIAGNOSIS — J440 Chronic obstructive pulmonary disease with acute lower respiratory infection: Secondary | ICD-10-CM | POA: Diagnosis present

## 2023-06-21 DIAGNOSIS — N1832 Chronic kidney disease, stage 3b: Secondary | ICD-10-CM | POA: Diagnosis present

## 2023-06-21 DIAGNOSIS — K766 Portal hypertension: Secondary | ICD-10-CM | POA: Diagnosis present

## 2023-06-21 DIAGNOSIS — I110 Hypertensive heart disease with heart failure: Secondary | ICD-10-CM | POA: Diagnosis not present

## 2023-06-21 DIAGNOSIS — I440 Atrioventricular block, first degree: Secondary | ICD-10-CM | POA: Diagnosis present

## 2023-06-21 DIAGNOSIS — R0989 Other specified symptoms and signs involving the circulatory and respiratory systems: Secondary | ICD-10-CM | POA: Diagnosis not present

## 2023-06-21 DIAGNOSIS — R609 Edema, unspecified: Secondary | ICD-10-CM | POA: Diagnosis not present

## 2023-06-21 DIAGNOSIS — J449 Chronic obstructive pulmonary disease, unspecified: Secondary | ICD-10-CM | POA: Diagnosis present

## 2023-06-21 DIAGNOSIS — R627 Adult failure to thrive: Secondary | ICD-10-CM | POA: Diagnosis present

## 2023-06-21 DIAGNOSIS — E66811 Obesity, class 1: Secondary | ICD-10-CM | POA: Diagnosis present

## 2023-06-21 DIAGNOSIS — F419 Anxiety disorder, unspecified: Secondary | ICD-10-CM | POA: Diagnosis present

## 2023-06-21 DIAGNOSIS — N1831 Chronic kidney disease, stage 3a: Secondary | ICD-10-CM | POA: Diagnosis not present

## 2023-06-21 DIAGNOSIS — J8410 Pulmonary fibrosis, unspecified: Secondary | ICD-10-CM | POA: Diagnosis present

## 2023-06-21 DIAGNOSIS — I130 Hypertensive heart and chronic kidney disease with heart failure and stage 1 through stage 4 chronic kidney disease, or unspecified chronic kidney disease: Secondary | ICD-10-CM | POA: Diagnosis present

## 2023-06-21 DIAGNOSIS — Z86711 Personal history of pulmonary embolism: Secondary | ICD-10-CM

## 2023-06-21 DIAGNOSIS — J441 Chronic obstructive pulmonary disease with (acute) exacerbation: Secondary | ICD-10-CM | POA: Diagnosis not present

## 2023-06-21 DIAGNOSIS — J159 Unspecified bacterial pneumonia: Secondary | ICD-10-CM | POA: Diagnosis present

## 2023-06-21 DIAGNOSIS — Z7189 Other specified counseling: Secondary | ICD-10-CM | POA: Diagnosis not present

## 2023-06-21 DIAGNOSIS — I10 Essential (primary) hypertension: Secondary | ICD-10-CM | POA: Diagnosis present

## 2023-06-21 DIAGNOSIS — J189 Pneumonia, unspecified organism: Secondary | ICD-10-CM

## 2023-06-21 DIAGNOSIS — I272 Pulmonary hypertension, unspecified: Secondary | ICD-10-CM | POA: Diagnosis present

## 2023-06-21 DIAGNOSIS — I471 Supraventricular tachycardia, unspecified: Secondary | ICD-10-CM | POA: Diagnosis present

## 2023-06-21 DIAGNOSIS — E871 Hypo-osmolality and hyponatremia: Secondary | ICD-10-CM | POA: Diagnosis present

## 2023-06-21 DIAGNOSIS — R531 Weakness: Secondary | ICD-10-CM | POA: Diagnosis not present

## 2023-06-21 DIAGNOSIS — N189 Chronic kidney disease, unspecified: Secondary | ICD-10-CM | POA: Diagnosis not present

## 2023-06-21 DIAGNOSIS — N179 Acute kidney failure, unspecified: Secondary | ICD-10-CM | POA: Diagnosis present

## 2023-06-21 DIAGNOSIS — Z66 Do not resuscitate: Secondary | ICD-10-CM | POA: Diagnosis present

## 2023-06-21 DIAGNOSIS — I5031 Acute diastolic (congestive) heart failure: Secondary | ICD-10-CM | POA: Diagnosis not present

## 2023-06-21 DIAGNOSIS — Z6836 Body mass index (BMI) 36.0-36.9, adult: Secondary | ICD-10-CM

## 2023-06-21 DIAGNOSIS — I456 Pre-excitation syndrome: Secondary | ICD-10-CM | POA: Diagnosis not present

## 2023-06-21 DIAGNOSIS — J90 Pleural effusion, not elsewhere classified: Secondary | ICD-10-CM | POA: Diagnosis not present

## 2023-06-21 DIAGNOSIS — K7460 Unspecified cirrhosis of liver: Secondary | ICD-10-CM | POA: Diagnosis not present

## 2023-06-21 DIAGNOSIS — I5033 Acute on chronic diastolic (congestive) heart failure: Secondary | ICD-10-CM | POA: Diagnosis present

## 2023-06-21 DIAGNOSIS — R54 Age-related physical debility: Secondary | ICD-10-CM | POA: Diagnosis present

## 2023-06-21 DIAGNOSIS — N2889 Other specified disorders of kidney and ureter: Secondary | ICD-10-CM | POA: Diagnosis not present

## 2023-06-21 DIAGNOSIS — C641 Malignant neoplasm of right kidney, except renal pelvis: Secondary | ICD-10-CM | POA: Diagnosis present

## 2023-06-21 DIAGNOSIS — R140 Abdominal distension (gaseous): Secondary | ICD-10-CM | POA: Diagnosis not present

## 2023-06-21 DIAGNOSIS — K7469 Other cirrhosis of liver: Secondary | ICD-10-CM | POA: Diagnosis present

## 2023-06-21 DIAGNOSIS — D630 Anemia in neoplastic disease: Secondary | ICD-10-CM | POA: Diagnosis present

## 2023-06-21 DIAGNOSIS — I2510 Atherosclerotic heart disease of native coronary artery without angina pectoris: Secondary | ICD-10-CM | POA: Diagnosis present

## 2023-06-21 DIAGNOSIS — Z8679 Personal history of other diseases of the circulatory system: Secondary | ICD-10-CM | POA: Diagnosis not present

## 2023-06-21 DIAGNOSIS — R188 Other ascites: Secondary | ICD-10-CM | POA: Diagnosis present

## 2023-06-21 DIAGNOSIS — I50812 Chronic right heart failure: Secondary | ICD-10-CM | POA: Diagnosis not present

## 2023-06-21 DIAGNOSIS — M7989 Other specified soft tissue disorders: Secondary | ICD-10-CM | POA: Diagnosis not present

## 2023-06-21 DIAGNOSIS — I700 Atherosclerosis of aorta: Secondary | ICD-10-CM | POA: Diagnosis not present

## 2023-06-21 DIAGNOSIS — I129 Hypertensive chronic kidney disease with stage 1 through stage 4 chronic kidney disease, or unspecified chronic kidney disease: Secondary | ICD-10-CM | POA: Diagnosis not present

## 2023-06-21 DIAGNOSIS — I4891 Unspecified atrial fibrillation: Secondary | ICD-10-CM | POA: Diagnosis not present

## 2023-06-21 DIAGNOSIS — Z7951 Long term (current) use of inhaled steroids: Secondary | ICD-10-CM

## 2023-06-21 DIAGNOSIS — Z79899 Other long term (current) drug therapy: Secondary | ICD-10-CM

## 2023-06-21 DIAGNOSIS — R918 Other nonspecific abnormal finding of lung field: Secondary | ICD-10-CM | POA: Diagnosis not present

## 2023-06-21 LAB — CBC
HCT: 41.3 % (ref 39.0–52.0)
Hemoglobin: 12.9 g/dL — ABNORMAL LOW (ref 13.0–17.0)
MCH: 28.2 pg (ref 26.0–34.0)
MCHC: 31.2 g/dL (ref 30.0–36.0)
MCV: 90.4 fL (ref 80.0–100.0)
Platelets: 144 10*3/uL — ABNORMAL LOW (ref 150–400)
RBC: 4.57 MIL/uL (ref 4.22–5.81)
RDW: 15.9 % — ABNORMAL HIGH (ref 11.5–15.5)
WBC: 6.9 10*3/uL (ref 4.0–10.5)
nRBC: 0 % (ref 0.0–0.2)

## 2023-06-21 LAB — COMPREHENSIVE METABOLIC PANEL
ALT: 30 U/L (ref 0–44)
AST: 54 U/L — ABNORMAL HIGH (ref 15–41)
Albumin: 2.9 g/dL — ABNORMAL LOW (ref 3.5–5.0)
Alkaline Phosphatase: 182 U/L — ABNORMAL HIGH (ref 38–126)
Anion gap: 13 (ref 5–15)
BUN: 34 mg/dL — ABNORMAL HIGH (ref 8–23)
CO2: 23 mmol/L (ref 22–32)
Calcium: 8.6 mg/dL — ABNORMAL LOW (ref 8.9–10.3)
Chloride: 103 mmol/L (ref 98–111)
Creatinine, Ser: 1.64 mg/dL — ABNORMAL HIGH (ref 0.61–1.24)
GFR, Estimated: 41 mL/min — ABNORMAL LOW (ref >60)
Glucose, Bld: 125 mg/dL — ABNORMAL HIGH (ref 70–99)
Potassium: 4.3 mmol/L (ref 3.5–5.1)
Sodium: 139 mmol/L (ref 135–145)
Total Bilirubin: 0.7 mg/dL (ref 0.0–1.2)
Total Protein: 6.5 g/dL (ref 6.5–8.1)

## 2023-06-21 LAB — BRAIN NATRIURETIC PEPTIDE: B Natriuretic Peptide: 291.1 pg/mL — ABNORMAL HIGH (ref 0.0–100.0)

## 2023-06-21 LAB — I-STAT VENOUS BLOOD GAS, ED
Acid-Base Excess: 3 mmol/L — ABNORMAL HIGH (ref 0.0–2.0)
Bicarbonate: 27.4 mmol/L (ref 20.0–28.0)
Calcium, Ion: 1.01 mmol/L — ABNORMAL LOW (ref 1.15–1.40)
HCT: 39 % (ref 39.0–52.0)
Hemoglobin: 13.3 g/dL (ref 13.0–17.0)
O2 Saturation: 81 %
Potassium: 5.7 mmol/L — ABNORMAL HIGH (ref 3.5–5.1)
Sodium: 136 mmol/L (ref 135–145)
TCO2: 29 mmol/L (ref 22–32)
pCO2, Ven: 41.4 mmHg — ABNORMAL LOW (ref 44–60)
pH, Ven: 7.428 (ref 7.25–7.43)
pO2, Ven: 45 mmHg (ref 32–45)

## 2023-06-21 LAB — RESP PANEL BY RT-PCR (RSV, FLU A&B, COVID)  RVPGX2
Influenza A by PCR: NEGATIVE
Influenza B by PCR: NEGATIVE
Resp Syncytial Virus by PCR: NEGATIVE
SARS Coronavirus 2 by RT PCR: NEGATIVE

## 2023-06-21 LAB — URINALYSIS, W/ REFLEX TO CULTURE (INFECTION SUSPECTED)
Bilirubin Urine: NEGATIVE
Glucose, UA: NEGATIVE mg/dL
Hgb urine dipstick: NEGATIVE
Ketones, ur: NEGATIVE mg/dL
Leukocytes,Ua: NEGATIVE
Nitrite: NEGATIVE
Protein, ur: NEGATIVE mg/dL
Specific Gravity, Urine: 1.012 (ref 1.005–1.030)
pH: 5 (ref 5.0–8.0)

## 2023-06-21 LAB — TROPONIN I (HIGH SENSITIVITY)
Troponin I (High Sensitivity): 11 ng/L (ref <18)
Troponin I (High Sensitivity): 9 ng/L (ref <18)

## 2023-06-21 MED ORDER — FUROSEMIDE 10 MG/ML IJ SOLN
40.0000 mg | Freq: Once | INTRAMUSCULAR | Status: AC
  Administered 2023-06-21: 40 mg via INTRAVENOUS
  Filled 2023-06-21: qty 4

## 2023-06-21 MED ORDER — SODIUM CHLORIDE 0.9 % IV SOLN
500.0000 mg | Freq: Once | INTRAVENOUS | Status: AC
  Administered 2023-06-22: 500 mg via INTRAVENOUS
  Filled 2023-06-21: qty 5

## 2023-06-21 MED ORDER — SODIUM CHLORIDE 0.9 % IV SOLN
1.0000 g | Freq: Once | INTRAVENOUS | Status: AC
  Administered 2023-06-21: 1 g via INTRAVENOUS
  Filled 2023-06-21: qty 10

## 2023-06-21 NOTE — ED Provider Triage Note (Signed)
 Emergency Medicine Provider Triage Evaluation Note  Chad Hogan , a 83 y.o. male  was evaluated in triage.  Pt complains of patient reports he has felt a little "fuzzy" in his head.  He reports he is also been more generally weak and having to use a walker or cane more than baseline.  This has been going on for about a week.  Patient also reports he is getting a lot of swelling in his lower legs and abdomen.  Patient reports that he had temporarily been at [facility] for rehabilitation but has been home for a while now.  Review of Systems  Positive: Lower extremity swelling and abdominal swelling Negative: Fever or vomiting  Physical Exam  BP 120/69   Pulse 73   Temp 97.8 F (36.6 C)   Resp 16   Ht 5\' 11"  (1.803 m)   Wt 115.7 kg   SpO2 97%   BMI 35.57 kg/m  Gen:   Awake, no distress deconditioned appearance Resp:  Mild increased work of breathing at rest.  Crackles in the right lung field. MSK:   Patient has 3+ large pitting edema bilateral lower extremities with diffuse erythema. Other:  Patient is awake and alert.  He is situationally appropriate.  Not overtly confused.  No focal neurologic deficits.  Patient has not ambulated.  He is seated in wheelchair. Patient is very deconditioned with significant central abdominal obesity and distention and large lower extremity edema.  Medical Decision Making  Medically screening exam initiated at 1:58 PM.  Appropriate orders placed.  TYREN DUGAR was informed that the remainder of the evaluation will be completed by another provider, this initial triage assessment does not replace that evaluation, and the importance of remaining in the ED until their evaluation is complete.     Arby Barrette, MD 06/21/23 [phone]

## 2023-06-21 NOTE — ED Notes (Signed)
 Daughter patina [phone] would like an update

## 2023-06-21 NOTE — ED Triage Notes (Signed)
 Patient BIB [name] from home for bilateral leg swelling and weight gain of 12 lbs in a week. Patient has hx of fluid retention and has had a abdominocentesis previously. Patient denies shob except when laying down, on baseline O2 at this time, and able to ambulate to stretcher with EMS. 96% 3L, HR 86 afib, 136/76, RR 18, CBG 126

## 2023-06-21 NOTE — ED Provider Notes (Signed)
  EMERGENCY DEPARTMENT AT [facility] Provider Note   CSN: [id number] Arrival date & time: 06/21/23  1329     History  Chief Complaint  Patient presents with   Leg Swelling    Chad Hogan is a 83 y.o. male with WPW, pulm fibrosis, COPD on 3L [place] at baseline, OSA, h/o PE, CKD, chronic diastolic CHF, ischemic heart disease, umbilical hernia, h/o SVT who presents with feeling a little "fuzzy" in his head.  He reports he is also been more generally weak and having to use a walker or cane more than baseline.  This has been going on for about a week.  Patient also reports he is getting a lot of swelling in his lower legs and abdomen which is new for him also x 1 week.  Patient reports that he had temporarily been at [name] nursing home for rehabilitation but has been home for a while now. Reports recurrent PNA treated with multiple rounds of antibiotics as well. He states he feels short of breath all the time, has been doing okay on his 3L [place]. Denies cough, increase in wheezing, f/c, flu-like symptoms. Endorses fall recently which resulted in [facility] treated at [facility]. Denies new headache, visual changes, new trauma, or FNDs. Does feel "fuzzy" and dizzy when he gets up in the morning. No recent changes to his medications but he admits he might not know what he is taking, as his daughter Patina manages his medications. She lives a few miles up the road and checks on him twice per day. He lives alone. Does take torsemide at home.    Past Medical History:  Diagnosis Date   Anxiety    CAP (community acquired pneumonia) 12/2016   CKD (chronic kidney disease) stage 3, GFR 30-59 ml/min (HCC)    COPD (chronic obstructive pulmonary disease) (HCC)    Diastolic CHF, chronic (HCC)    HLD (hyperlipidemia)    HTN (hypertension)    Ischemic heart disease    Obesity    OSA (obstructive sleep apnea)    Palpitation    Pulmonary embolism (HCC)    PVC (premature ventricular contraction)     SVT (supraventricular tachycardia) (HCC)    Umbilical hernia    WPW (Wolff-Parkinson-White syndrome)        Home Medications Prior to Admission medications   Medication Sig Start Date End Date Taking? Authorizing Provider  ALPRAZolam Prudy Feeler) 1 MG tablet Take 1 tablet (1 mg total) by mouth 2 (two) times daily as needed for anxiety. 12/30/16   Lonia Blood, MD  famotidine (PEPCID) 40 MG tablet Take 40 mg by mouth daily.    [provider]  fluticasone-salmeterol (ADVAIR) 250-50 MCG/ACT AEPB Inhale 1 puff into the lungs 2 (two) times daily. 09/30/21   [provider]  levETIRAcetam (KEPPRA) 500 MG tablet Take by mouth. 04/27/23   [provider]  lidocaine 4 % Apply topically. 04/28/23 07/27/23  [provider]  OVER THE COUNTER MEDICATION Inhale 1 application into the lungs at bedtime. CPAP with O2    [provider]  potassium chloride SA (K-DUR,KLOR-CON) 20 MEQ tablet Take 2 tablets (40 mEq total) by mouth daily. 01/01/17   Drema Dallas, MD  rosuvastatin (CRESTOR) 10 MG tablet Take 1 tablet (10 mg total) by mouth at bedtime. 01/01/17   Drema Dallas, MD  senna (SENOKOT) 8.6 MG tablet Take by mouth. 04/27/23 07/26/23  [provider]  torsemide (DEMADEX) 20 MG tablet Take 2  tablets twice daily for 5 days, then decrease to 2 tablets once daily (40mg  total) thereafter 11/11/22   [name], MD  verapamil (CALAN-SR) 240 MG CR tablet Take 1 tablet (240 mg total) by mouth 2 (two) times daily. 12/30/22   [name], MD      Allergies    Eliquis [apixaban] and Codeine    Review of Systems   Review of Systems A 10 point review of systems was performed and is negative unless otherwise reported in HPI.  Physical Exam Updated Vital Signs BP 123/69   Pulse 74   Temp 98.1 F (36.7 C) (Oral)   Resp 18   Ht 5\' 11"  (1.803 m)   Wt 115.7 kg   SpO2 98%   BMI 35.57 kg/m  Physical Exam General: Normal appearing male, lying in  bed.  HEENT: PERRLA, Sclera anicteric, MMM, trachea midline.  Cardiology: RRR, no murmurs/rubs/gallops.  Resp: On 3L [place]. No resp distress or significantly increased WOB. Coarse lung sounds bilaterally w/ crackles. No wheezing.  Abd: Soft, non-tender, mildly distended. No rebound tenderness or guarding.  GU: Deferred. MSK: 3+ pitting symmetric peripheral edema up to thighs. Chronic venous stasis changes in BL LEs. No signs of trauma. Extremities without deformity or TTP. No cyanosis or clubbing. Skin: warm, dry.  Back: No CVA tenderness Neuro: A&Ox4, CNs II-XII grossly intact. MAEs. Sensation grossly intact.  Psych: Normal mood and affect.   ED Results / Procedures / Treatments   Labs (all labs ordered are listed, but only abnormal results are displayed) Labs Reviewed  CBC - Abnormal; Notable for the following components:      Result Value   Hemoglobin 12.9 (*)    RDW 15.9 (*)    Platelets 144 (*)    All other components within normal limits  BRAIN NATRIURETIC PEPTIDE - Abnormal; Notable for the following components:   B Natriuretic Peptide 291.1 (*)    All other components within normal limits  COMPREHENSIVE METABOLIC PANEL - Abnormal; Notable for the following components:   Glucose, Bld 125 (*)    BUN 34 (*)    Creatinine, Ser 1.64 (*)    Calcium 8.6 (*)    Albumin 2.9 (*)    AST 54 (*)    Alkaline Phosphatase 182 (*)    GFR, Estimated 41 (*)    All other components within normal limits  URINALYSIS, W/ REFLEX TO CULTURE (INFECTION SUSPECTED) - Abnormal; Notable for the following components:   Bacteria, UA RARE (*)    All other components within normal limits  I-STAT VENOUS BLOOD GAS, ED - Abnormal; Notable for the following components:   pCO2, Ven 41.4 (*)    Acid-Base Excess 3.0 (*)    Potassium 5.7 (*)    Calcium, Ion 1.01 (*)    All other components within normal limits  RESP PANEL BY RT-PCR (RSV, FLU A&B, COVID)  RVPGX2  AMMONIA  TROPONIN I (HIGH SENSITIVITY)   TROPONIN I (HIGH SENSITIVITY)    EKG EKG Interpretation Date/Time:  Monday June 21 2023 13:59:08 EDT Ventricular Rate:  74 PR Interval:  232 QRS Duration:  104 QT Interval:  406 QTC Calculation: 450 R Axis:   -40  Text Interpretation: Sinus rhythm with 1st degree A-V block Left axis deviation Low voltage QRS Possible Anterolateral infarct , age undetermined Confirmed by Vivi Barrack [phone]) on 06/21/2023 7:02:32 PM  Radiology DG Chest 2 View Result Date: 06/21/2023 CLINICAL DATA:  Leg swelling EXAM: CHEST - 2 VIEW COMPARISON:  Chest x-ray 05/12/2023 FINDINGS: There is a small left pleural effusion. There some patchy opacities in the right costophrenic angle and left lung base. The heart is enlarged. There central pulmonary vascular congestion. No pneumothorax or acute fracture. IMPRESSION: 1. Small left pleural effusion. 2. Patchy opacities in the right costophrenic angle and left lung base may represent atelectasis or infection. 3. Cardiomegaly with central pulmonary vascular congestion. Electronically Signed   By: Darliss Cheney M.D.   On: 06/21/2023 18:13    Procedures Procedures    Medications Ordered in ED Medications  furosemide (LASIX) injection 40 mg (has no administration in time range)  azithromycin (ZITHROMAX) 500 mg in sodium chloride 0.9 % 250 mL IVPB (has no administration in time range)  cefTRIAXone (ROCEPHIN) 1 g in sodium chloride 0.9 % 100 mL IVPB (has no administration in time range)    ED Course/ Medical Decision Making/ A&P                          Medical Decision Making Amount and/or Complexity of Data Reviewed Labs: ordered. Decision-making details documented in ED Course. Radiology: ordered. Decision-making details documented in ED Course.  Risk Prescription drug management. Decision regarding hospitalization.    This patient presents to the ED for concern of generalized weakness, dizziness, lower extremity edema, this involves an extensive  number of treatment options, and is a complaint that carries with it a high risk of complications and morbidity.  I considered the following differential and admission for this acute, potentially life threatening condition.   MDM:    DDX for generalized weakness includes but is not limited to:  For his gen weakness, consider infectious processes, severe metabolic derangements or electrolyte abnormalities. He has no chest pain or signs of ischemia on EKG to indicate ischemia/ACS. Believe intracranial/central processes such as worsening or recurrent ICH are unlikely given the history and physical exam. His lower extremity edema and abdominal swelling would be c/w heart failure and his BNP is elevated to 291. He has coarse breath sounds and crackles bilaterally, and CXR shows vascular congestion, cardiomegaly, possible pulm edema, and possible patchy opacities bilaterally that could indicate pneumonia. I am concerned he may have worsening heart failure or failure o/p treatment w/ pneumonia. He also has AKI, could consider nephrotic syndrome, though no proteinuria.    Clinical Course as of 06/21/23 2339  Mon Jun 21, 2023  1901 Troponin I (High Sensitivity): 9 neg [HN]  1901 Creatinine(!): 1.64 +AKI w/ elevated BUN as well to 34 [HN]  1901 B Natriuretic Peptide(!): 291.1 elevated [HN]  1901 WBC: 6.9 No leukocytosis  [HN]  1901 I-Stat venous blood gas, ED(!) Unremarkable in the context of this patient's presentation  [HN]  1902 DG Chest 2 View 1. Small left pleural effusion. 2. Patchy opacities in the right costophrenic angle and left lung base may represent atelectasis or infection. 3. Cardiomegaly with central pulmonary vascular congestion.   [HN]  2040 Urinalysis, w/ Reflex to Culture (Infection Suspected) -Urine, Clean Catch(!) Neg for UTI [HN]  2041 Last echocardiogram was in 2018 and showed LVEF 60% with G1DD.  [HN]  2255 Daughter Patina updated over the phone. Discussed with patient  who agrees with admission. Concern for HF exacerbation with possible failure of o/p abx for PNA. Patient is also very weak and lives alone, not getting around well, feeling intermittently dizzy, concerned for his ability to care for himself at home. Given IV lasix, ceftriaxone, and azithromycin. Consulted to  hospitalist. [HN]    Clinical Course User Index [HN] Loetta Rough, MD    Labs: I Ordered, and personally interpreted labs.  The pertinent results include:  those listed above  Imaging Studies ordered: I ordered imaging studies including CXR I independently visualized and interpreted imaging. I agree with the radiologist interpretation  Additional history obtained from chart review, daughter over the phone.  External records from outside source obtained and reviewed including [facility]  Reevaluation: After the interventions noted above, I reevaluated the patient and found that they have :stayed the same  Social Determinants of Health: Lives independently with help from daughter  Disposition:  Admit to hospitalist  Co morbidities that complicate the patient evaluation  Past Medical History:  Diagnosis Date   Anxiety    CAP (community acquired pneumonia) 12/2016   CKD (chronic kidney disease) stage 3, GFR 30-59 ml/min (HCC)    COPD (chronic obstructive pulmonary disease) (HCC)    Diastolic CHF, chronic (HCC)    HLD (hyperlipidemia)    HTN (hypertension)    Ischemic heart disease    Obesity    OSA (obstructive sleep apnea)    Palpitation    Pulmonary embolism (HCC)    PVC (premature ventricular contraction)    SVT (supraventricular tachycardia) (HCC)    Umbilical hernia    WPW (Wolff-Parkinson-White syndrome)      Medicines Meds ordered this encounter  Medications   furosemide (LASIX) injection 40 mg   azithromycin (ZITHROMAX) 500 mg in sodium chloride 0.9 % 250 mL IVPB   cefTRIAXone (ROCEPHIN) 1 g in sodium chloride 0.9 % 100 mL IVPB    Antibiotic Indication::    CAP    I have reviewed the patients home medicines and have made adjustments as needed  Problem List / ED Course: Problem List Items Addressed This Visit   None Visit Diagnoses       Acute on chronic congestive heart failure, unspecified heart failure type (HCC)    -  Primary   Relevant Medications   furosemide (LASIX) injection 40 mg     Peripheral edema         Generalized weakness                       This note was created using dictation software, which may contain spelling or grammatical errors.    Loetta Rough, MD 06/21/23 [phone]

## 2023-06-22 ENCOUNTER — Inpatient Hospital Stay (HOSPITAL_COMMUNITY)

## 2023-06-22 ENCOUNTER — Encounter (HOSPITAL_COMMUNITY): Payer: Self-pay | Admitting: Internal Medicine

## 2023-06-22 DIAGNOSIS — I5043 Acute on chronic combined systolic (congestive) and diastolic (congestive) heart failure: Secondary | ICD-10-CM | POA: Diagnosis not present

## 2023-06-22 DIAGNOSIS — J440 Chronic obstructive pulmonary disease with acute lower respiratory infection: Secondary | ICD-10-CM | POA: Diagnosis present

## 2023-06-22 DIAGNOSIS — I272 Pulmonary hypertension, unspecified: Secondary | ICD-10-CM | POA: Diagnosis present

## 2023-06-22 DIAGNOSIS — N1831 Chronic kidney disease, stage 3a: Secondary | ICD-10-CM | POA: Diagnosis not present

## 2023-06-22 DIAGNOSIS — I471 Supraventricular tachycardia, unspecified: Secondary | ICD-10-CM | POA: Diagnosis present

## 2023-06-22 DIAGNOSIS — E871 Hypo-osmolality and hyponatremia: Secondary | ICD-10-CM | POA: Diagnosis present

## 2023-06-22 DIAGNOSIS — I129 Hypertensive chronic kidney disease with stage 1 through stage 4 chronic kidney disease, or unspecified chronic kidney disease: Secondary | ICD-10-CM | POA: Diagnosis not present

## 2023-06-22 DIAGNOSIS — R531 Weakness: Secondary | ICD-10-CM | POA: Diagnosis not present

## 2023-06-22 DIAGNOSIS — I509 Heart failure, unspecified: Secondary | ICD-10-CM | POA: Diagnosis not present

## 2023-06-22 DIAGNOSIS — I50812 Chronic right heart failure: Secondary | ICD-10-CM | POA: Diagnosis not present

## 2023-06-22 DIAGNOSIS — I5033 Acute on chronic diastolic (congestive) heart failure: Secondary | ICD-10-CM

## 2023-06-22 DIAGNOSIS — R609 Edema, unspecified: Secondary | ICD-10-CM | POA: Diagnosis not present

## 2023-06-22 DIAGNOSIS — C649 Malignant neoplasm of unspecified kidney, except renal pelvis: Secondary | ICD-10-CM | POA: Diagnosis not present

## 2023-06-22 DIAGNOSIS — R180 Malignant ascites: Secondary | ICD-10-CM | POA: Diagnosis not present

## 2023-06-22 DIAGNOSIS — Z1152 Encounter for screening for COVID-19: Secondary | ICD-10-CM | POA: Diagnosis not present

## 2023-06-22 DIAGNOSIS — K7460 Unspecified cirrhosis of liver: Secondary | ICD-10-CM | POA: Diagnosis not present

## 2023-06-22 DIAGNOSIS — N2889 Other specified disorders of kidney and ureter: Secondary | ICD-10-CM | POA: Diagnosis not present

## 2023-06-22 DIAGNOSIS — J449 Chronic obstructive pulmonary disease, unspecified: Secondary | ICD-10-CM | POA: Diagnosis present

## 2023-06-22 DIAGNOSIS — E876 Hypokalemia: Secondary | ICD-10-CM | POA: Diagnosis not present

## 2023-06-22 DIAGNOSIS — D630 Anemia in neoplastic disease: Secondary | ICD-10-CM | POA: Diagnosis present

## 2023-06-22 DIAGNOSIS — I130 Hypertensive heart and chronic kidney disease with heart failure and stage 1 through stage 4 chronic kidney disease, or unspecified chronic kidney disease: Secondary | ICD-10-CM | POA: Diagnosis present

## 2023-06-22 DIAGNOSIS — N179 Acute kidney failure, unspecified: Secondary | ICD-10-CM | POA: Diagnosis present

## 2023-06-22 DIAGNOSIS — Z66 Do not resuscitate: Secondary | ICD-10-CM | POA: Diagnosis present

## 2023-06-22 DIAGNOSIS — I10 Essential (primary) hypertension: Secondary | ICD-10-CM | POA: Diagnosis not present

## 2023-06-22 DIAGNOSIS — I5031 Acute diastolic (congestive) heart failure: Secondary | ICD-10-CM | POA: Diagnosis not present

## 2023-06-22 DIAGNOSIS — Z515 Encounter for palliative care: Secondary | ICD-10-CM | POA: Diagnosis not present

## 2023-06-22 DIAGNOSIS — J8410 Pulmonary fibrosis, unspecified: Secondary | ICD-10-CM | POA: Diagnosis present

## 2023-06-22 DIAGNOSIS — E039 Hypothyroidism, unspecified: Secondary | ICD-10-CM | POA: Diagnosis present

## 2023-06-22 DIAGNOSIS — I700 Atherosclerosis of aorta: Secondary | ICD-10-CM | POA: Diagnosis not present

## 2023-06-22 DIAGNOSIS — D6959 Other secondary thrombocytopenia: Secondary | ICD-10-CM | POA: Diagnosis present

## 2023-06-22 DIAGNOSIS — N1832 Chronic kidney disease, stage 3b: Secondary | ICD-10-CM | POA: Diagnosis present

## 2023-06-22 DIAGNOSIS — J159 Unspecified bacterial pneumonia: Secondary | ICD-10-CM | POA: Diagnosis present

## 2023-06-22 DIAGNOSIS — C641 Malignant neoplasm of right kidney, except renal pelvis: Secondary | ICD-10-CM | POA: Diagnosis present

## 2023-06-22 DIAGNOSIS — R600 Localized edema: Secondary | ICD-10-CM | POA: Diagnosis present

## 2023-06-22 DIAGNOSIS — K766 Portal hypertension: Secondary | ICD-10-CM | POA: Diagnosis present

## 2023-06-22 DIAGNOSIS — K7469 Other cirrhosis of liver: Secondary | ICD-10-CM | POA: Diagnosis present

## 2023-06-22 DIAGNOSIS — J9621 Acute and chronic respiratory failure with hypoxia: Secondary | ICD-10-CM | POA: Diagnosis present

## 2023-06-22 DIAGNOSIS — R140 Abdominal distension (gaseous): Secondary | ICD-10-CM | POA: Diagnosis not present

## 2023-06-22 DIAGNOSIS — R188 Other ascites: Secondary | ICD-10-CM | POA: Diagnosis present

## 2023-06-22 DIAGNOSIS — E66811 Obesity, class 1: Secondary | ICD-10-CM | POA: Diagnosis not present

## 2023-06-22 DIAGNOSIS — R918 Other nonspecific abnormal finding of lung field: Secondary | ICD-10-CM | POA: Diagnosis not present

## 2023-06-22 DIAGNOSIS — Z7189 Other specified counseling: Secondary | ICD-10-CM | POA: Diagnosis not present

## 2023-06-22 DIAGNOSIS — F419 Anxiety disorder, unspecified: Secondary | ICD-10-CM | POA: Diagnosis not present

## 2023-06-22 DIAGNOSIS — R627 Adult failure to thrive: Secondary | ICD-10-CM | POA: Diagnosis present

## 2023-06-22 DIAGNOSIS — J189 Pneumonia, unspecified organism: Secondary | ICD-10-CM | POA: Diagnosis not present

## 2023-06-22 DIAGNOSIS — R0602 Shortness of breath: Secondary | ICD-10-CM | POA: Diagnosis not present

## 2023-06-22 DIAGNOSIS — I456 Pre-excitation syndrome: Secondary | ICD-10-CM | POA: Diagnosis not present

## 2023-06-22 LAB — ECHOCARDIOGRAM COMPLETE
Area-P 1/2: 2.6 cm2
Est EF: 55
Height: 71 in
S' Lateral: 3.5 cm
Weight: 4080 [oz_av]

## 2023-06-22 LAB — CBC
HCT: 33.7 % — ABNORMAL LOW (ref 39.0–52.0)
Hemoglobin: 10.6 g/dL — ABNORMAL LOW (ref 13.0–17.0)
MCH: 28.6 pg (ref 26.0–34.0)
MCHC: 31.5 g/dL (ref 30.0–36.0)
MCV: 91.1 fL (ref 80.0–100.0)
Platelets: 106 10*3/uL — ABNORMAL LOW (ref 150–400)
RBC: 3.7 MIL/uL — ABNORMAL LOW (ref 4.22–5.81)
RDW: 16 % — ABNORMAL HIGH (ref 11.5–15.5)
WBC: 5.8 10*3/uL (ref 4.0–10.5)
nRBC: 0 % (ref 0.0–0.2)

## 2023-06-22 LAB — BASIC METABOLIC PANEL
Anion gap: 3 — ABNORMAL LOW (ref 5–15)
BUN: 30 mg/dL — ABNORMAL HIGH (ref 8–23)
CO2: 26 mmol/L (ref 22–32)
Calcium: 7.1 mg/dL — ABNORMAL LOW (ref 8.9–10.3)
Chloride: 108 mmol/L (ref 98–111)
Creatinine, Ser: 1.47 mg/dL — ABNORMAL HIGH (ref 0.61–1.24)
GFR, Estimated: 47 mL/min — ABNORMAL LOW (ref >60)
Glucose, Bld: 82 mg/dL (ref 70–99)
Potassium: 3.7 mmol/L (ref 3.5–5.1)
Sodium: 137 mmol/L (ref 135–145)

## 2023-06-22 LAB — PROCALCITONIN: Procalcitonin: 0.17 ng/mL

## 2023-06-22 LAB — MRSA NEXT GEN BY PCR, NASAL: MRSA by PCR Next Gen: NOT DETECTED

## 2023-06-22 LAB — AMMONIA: Ammonia: 19 umol/L (ref 9–35)

## 2023-06-22 MED ORDER — ENOXAPARIN SODIUM 40 MG/0.4ML IJ SOSY
40.0000 mg | PREFILLED_SYRINGE | INTRAMUSCULAR | Status: DC
  Administered 2023-06-22 – 2023-06-30 (×9): 40 mg via SUBCUTANEOUS
  Filled 2023-06-22 (×9): qty 0.4

## 2023-06-22 MED ORDER — VERAPAMIL HCL ER 240 MG PO TBCR
240.0000 mg | EXTENDED_RELEASE_TABLET | Freq: Two times a day (BID) | ORAL | Status: DC
  Administered 2023-06-22 – 2023-07-06 (×28): 240 mg via ORAL
  Filled 2023-06-22 (×30): qty 1

## 2023-06-22 MED ORDER — VANCOMYCIN HCL 2000 MG/400ML IV SOLN
2000.0000 mg | Freq: Once | INTRAVENOUS | Status: AC
Start: 2023-06-22 — End: 2023-06-22
  Administered 2023-06-22: 2000 mg via INTRAVENOUS
  Filled 2023-06-22: qty 400

## 2023-06-22 MED ORDER — OXYCODONE HCL 5 MG PO TABS: 5.0000 mg | ORAL_TABLET | ORAL | Status: DC | PRN

## 2023-06-22 MED ORDER — IPRATROPIUM-ALBUTEROL 0.5-2.5 (3) MG/3ML IN SOLN
3.0000 mL | Freq: Four times a day (QID) | RESPIRATORY_TRACT | Status: DC | PRN
Start: 2023-06-22 — End: 2023-07-08
  Administered 2023-06-24: 3 mL via RESPIRATORY_TRACT
  Filled 2023-06-22: qty 3

## 2023-06-22 MED ORDER — ONDANSETRON HCL 4 MG/2ML IJ SOLN: 4.0000 mg | Freq: Four times a day (QID) | INTRAMUSCULAR | Status: DC | PRN

## 2023-06-22 MED ORDER — LEVETIRACETAM 500 MG PO TABS: 500.0000 mg | ORAL_TABLET | Freq: Two times a day (BID) | ORAL | Status: DC

## 2023-06-22 MED ORDER — ACETAMINOPHEN 325 MG PO TABS: 650.0000 mg | ORAL_TABLET | Freq: Four times a day (QID) | ORAL | Status: DC | PRN

## 2023-06-22 MED ORDER — SODIUM CHLORIDE 0.9 % IV SOLN
2.0000 g | Freq: Two times a day (BID) | INTRAVENOUS | Status: DC
  Administered 2023-06-22 – 2023-06-23 (×3): 2 g via INTRAVENOUS
  Filled 2023-06-22 (×3): qty 12.5

## 2023-06-22 MED ORDER — PANTOPRAZOLE SODIUM 40 MG PO TBEC
40.0000 mg | DELAYED_RELEASE_TABLET | Freq: Every morning | ORAL | Status: DC
  Administered 2023-06-22 – 2023-07-07 (×16): 40 mg via ORAL
  Filled 2023-06-22 (×16): qty 1

## 2023-06-22 MED ORDER — VANCOMYCIN HCL 1500 MG/300ML IV SOLN
1500.0000 mg | INTRAVENOUS | Status: DC
  Administered 2023-06-23: 1500 mg via INTRAVENOUS
  Filled 2023-06-22: qty 300

## 2023-06-22 MED ORDER — FUROSEMIDE 10 MG/ML IJ SOLN
40.0000 mg | Freq: Two times a day (BID) | INTRAMUSCULAR | Status: DC
  Administered 2023-06-22 – 2023-06-24 (×5): 40 mg via INTRAVENOUS
  Filled 2023-06-22 (×4): qty 4

## 2023-06-22 MED ORDER — MOMETASONE FURO-FORMOTEROL FUM 200-5 MCG/ACT IN AERO
2.0000 | INHALATION_SPRAY | Freq: Two times a day (BID) | RESPIRATORY_TRACT | Status: DC
  Administered 2023-06-22 – 2023-07-07 (×30): 2 via RESPIRATORY_TRACT
  Filled 2023-06-22 (×5): qty 8.8

## 2023-06-22 MED ORDER — FUROSEMIDE 10 MG/ML IJ SOLN
40.0000 mg | Freq: Every day | INTRAMUSCULAR | Status: DC
  Filled 2023-06-22: qty 4

## 2023-06-22 MED ORDER — LEVOTHYROXINE SODIUM 25 MCG PO TABS
25.0000 ug | ORAL_TABLET | Freq: Every day | ORAL | Status: DC
  Administered 2023-06-22 – 2023-07-07 (×16): 25 ug via ORAL
  Filled 2023-06-22 (×16): qty 1

## 2023-06-22 MED ORDER — FUROSEMIDE 10 MG/ML IJ SOLN
40.0000 mg | Freq: Once | INTRAMUSCULAR | Status: AC
  Administered 2023-06-22: 40 mg via INTRAVENOUS
  Filled 2023-06-22: qty 4

## 2023-06-22 MED ORDER — ONDANSETRON HCL 4 MG PO TABS: 4.0000 mg | ORAL_TABLET | Freq: Four times a day (QID) | ORAL | Status: DC | PRN

## 2023-06-22 MED ORDER — ACETAMINOPHEN 650 MG RE SUPP: 650.0000 mg | Freq: Four times a day (QID) | RECTAL | Status: DC | PRN

## 2023-06-22 NOTE — Progress Notes (Signed)
 Patient seen and examined personally, I reviewed the chart, history and physical and admission note, done by admitting physician this morning and agree with the same with following addendum.  Please refer to the morning admission note for more detailed plan of care.  Briefly,  82 yom w/ HTN HLD, CAD, PE, CKD stage IIIb who presented to ED w/ complaints of dizziness tiredness and lightheadedness  and difficulty ambulating and shortness of breath and progressively worsening swelling in his legs and abdomen over the last week , treated at his nursing home for recurrent pneumonia with multiple rounds of antibiotics without improvement. In the ED hemodynamically stable afebrile labs reviewed normal wbc, somewhat elevated BNP 291 creatinine 1.6 AST 54 ALP 102 troponin 11>9 9, chest x-ray left-sided pleural effusion and opacities as well as pulmonary venous congestion and patient not admitted for further workup, started on IV antibiotics given Lasix 40 mg.   -- Seen this am " I dont feel too good this morning" On 4l [place], Asked RN to sit him up per his request Abdomen distended but no nausea vomiting- last Bm yesterday, says I gained weight Legs are swollen Overnight remains afebrile he is on 4 L nasal cannula.  Labs reviewed ABG with pCO2 45, BMP with improving creatinine 1.4 stable wBC with drifting hemoglobin and platelet. Procalcitonin obtained this morning 0.17. Some cough  A/P Acute on chronic hypoxic respiratory failure Fluid overload Bacterial pneumonia: Patient presentation likely multifactorial with hypoxia baseline 3 L currently on 4-5, recently treated with multiple rounds of antibiotics at nursing facility.  Congested chest x-ray, has history of liver cirrhosis.BNP somewhat elevated, Continue empiric antibiotics check procalcitonin, follow-up echocardiogram-shows EF 55% with G1 DD has RV pressure/volume overload with D-shaped septum- will keep on IV Lasix bid-hold home torsemide..  Monitor  intake output Daily weight, continue fluid restriction and salt started diet Will consult cardiology given significant fluid overload  RCC history: Follow-up with patient's Urology  Cryptogenic liver cirrhosis: Monitor volume status continue Lasix as above  CKD stage IIIb: Renal function stable monitor while on diuretics Recent Labs    03/01/23 1630 03/01/23 1711 04/20/23 1433 06/21/23 1403 06/21/23 1723 06/22/23 0525  BUN 18 22 13  34*  --  30*  CREATININE 1.13 1.20 1.18 1.64*  --  1.47*  CO2 24  --  26 23  --  26  K 4.4 4.3 3.9 4.3 5.7* 3.7    Hypertension History of SVT/WPW syndrome HLD: BP is stable.  Continue verapamil.  Continue Crestor  Chronic anemia Thrombocytopenia : This finding is likely from liver cirrhosis.  Monitor   COPD: Not in exacerbation. Cont home Dulera  Anxiety: On as needed Xanax continue

## 2023-06-22 NOTE — Plan of Care (Signed)

## 2023-06-22 NOTE — ED Notes (Signed)
 Awaiting dulera inhaler from main pharmacy

## 2023-06-22 NOTE — Consult Note (Addendum)
 Cardiology Consultation   Patient ID: RICCARDO HOLEMAN MRN: [id number]; DOB: 08/21/1940  Admit date: 06/21/2023 Date of Consult: 06/22/2023  PCP:  Street, [name], MD   [facility] Providers Cardiologist:  Sherryl Manges, MD       Patient Profile:   Chad Hogan is a 83 y.o. male with a hx of HTN, HLD, CAD, history of PE, CKD stage 3b, liver cirrhosis, history of SVT, WPW, COPD, HFpEF, OSA on CPAP, RCC, who is being seen 06/22/2023 for the evaluation of acute on chronic HFpEF at the request of Dr. Jonathon Bellows.  History of Present Illness:   Chad Hogan has a past medical history of HTN, HLD, CAD, history of PE, CKD stage 3b, SVT, WPW, COPD, HFpEF, OSA on CPAP, RCC, liver cirrhosis, recent SAH s/p fall. He presented to the [facility] ED on 06/21/2023 complaining of leg swelling, weakness, dizziness, increased shortness of breath. He currently resides at [facility] nursing home and has been treated for recurrent pneumonia. He states that his daughter typically manages his medications for him so he isn't sure of exactly what he takes.   Relevant workup in the ED/this admission includes: BNP 291, EKG showed sinus rhythm HR 74 bpm, troponin negative x 2, CXR showed small left pleural effusion, patchy opacities atelectasis vs infection, cardiomegaly with central pulmonary vascular congestion, negative respiratory panel, procal 0.17, follow up CXR was more concerning for multifocal infection. Echo showed EF 55%, G1DD, D-shaped septum, moderately enlarged RV.   Was given IV Lasix 40 mg x 2, started on IV antibiotics, continued on home verapamil and Crestor. Continued home breathing treatments for COPD.   Patient was last seen 10/2022 by Dr. Graciela Husbands for his history of SVT, WPW. At this time he had been experiencing no palpitations. He was on verapamil 240 mg BID, torsemide 40 mg daily, Crestor 10 mg daily. Patient was a bit bradycardic at this follow up but denied decreasing his verapamil dose.  After  speaking with patient, he agrees with the history as stated above. He is not sure of when he started feeling poorly or if its just all related to his recurrent PNA. In regards to his abdominal distention he cannot recall how long he has had been feeling like this.   Past Medical History:  Diagnosis Date   Anxiety    CAP (community acquired pneumonia) 12/2016   CKD (chronic kidney disease) stage 3, GFR 30-59 ml/min (HCC)    COPD (chronic obstructive pulmonary disease) (HCC)    Diastolic CHF, chronic (HCC)    HLD (hyperlipidemia)    HTN (hypertension)    Ischemic heart disease    Obesity    OSA (obstructive sleep apnea)    Palpitation    Pulmonary embolism (HCC)    PVC (premature ventricular contraction)    SVT (supraventricular tachycardia) (HCC)    Umbilical hernia    WPW (Wolff-Parkinson-White syndrome)    Past Surgical History:  Procedure Laterality Date   CARDIAC CATHETERIZATION N/A 03/16/2016   Procedure: Left Heart Cath and Coronary Angiography;  Surgeon: Lennette Bihari, MD;  Location: [facility] INVASIVE CV LAB;  Service: Cardiovascular;  Laterality: N/A;   COLONOSCOPY WITH PROPOFOL N/A 06/11/2014   Procedure: COLONOSCOPY WITH PROPOFOL;  Surgeon: Louis Meckel, MD;  Location: [facility] ENDOSCOPY;  Service: Endoscopy;  Laterality: N/A;   ELECTROPHYSIOLOGIC STUDY N/A 03/23/2016   Procedure: Electrophysiology Study;  Surgeon: Marinus Maw, MD;  Location: [facility] INVASIVE CV LAB;  Service: Cardiovascular;  Laterality: N/A;  ELECTROPHYSIOLOGIC STUDY N/A 03/23/2016   Procedure: SVT Ablation;  Surgeon: Marinus Maw, MD;  Location: [facility] INVASIVE CV LAB;  Service: Cardiovascular;  Laterality: N/A;   ESOPHAGOGASTRODUODENOSCOPY (EGD) WITH PROPOFOL N/A 06/11/2014   Procedure: ESOPHAGOGASTRODUODENOSCOPY (EGD) WITH PROPOFOL;  Surgeon: Louis Meckel, MD;  Location: [facility] ENDOSCOPY;  Service: Endoscopy;  Laterality: N/A;   HERNIA REPAIR      Home Medications:  Prior to Admission medications   Medication Sig  Start Date End Date Taking? Authorizing Provider  ALPRAZolam Prudy Feeler) 1 MG tablet Take 1 tablet (1 mg total) by mouth 2 (two) times daily as needed for anxiety. 12/30/16  Yes Lonia Blood, MD  budesonide (PULMICORT) 0.5 MG/2ML nebulizer solution Take 0.5 mg by nebulization in the morning and at bedtime. 05/27/23  Yes [provider]  cholecalciferol (VITAMIN D3) 25 MCG (1000 UNIT) tablet Take 1,000 Units by mouth daily.   Yes [provider]  ipratropium-albuterol (DUONEB) 0.5-2.5 (3) MG/3ML SOLN Take 3 mLs by nebulization every 6 (six) hours as needed (sob). 05/14/23  Yes [provider]  levothyroxine (SYNTHROID) 25 MCG tablet Take 25 mcg by mouth daily. 05/21/23  Yes [provider]  pantoprazole (PROTONIX) 40 MG tablet Take 40 mg by mouth every morning. 05/14/23  Yes [provider]  potassium chloride SA (K-DUR,KLOR-CON) 20 MEQ tablet Take 2 tablets (40 mEq total) by mouth daily. Patient taking differently: Take 20 mEq by mouth 2 (two) times daily. 01/01/17  Yes Drema Dallas, MD  rosuvastatin (CRESTOR) 10 MG tablet Take 1 tablet (10 mg total) by mouth at bedtime. 01/01/17  Yes Drema Dallas, MD  torsemide (DEMADEX) 20 MG tablet Take 2 tablets twice daily for 5 days, then decrease to 2 tablets once daily (40mg  total) thereafter Patient taking differently: Take 20 mg by mouth 2 (two) times daily. 11/11/22  Yes [name], MD  verapamil (CALAN-SR) 240 MG CR tablet Take 1 tablet (240 mg total) by mouth 2 (two) times daily. 12/30/22  Yes [name], MD  fluticasone-salmeterol (ADVAIR) 250-50 MCG/ACT AEPB Inhale 1 puff into the lungs 2 (two) times daily. Patient not taking: Reported on 06/22/2023 09/30/21   [provider]  levETIRAcetam (KEPPRA) 500 MG tablet Take by mouth. Patient not taking: Reported on 06/22/2023 04/27/23   [provider]  OVER THE COUNTER MEDICATION Inhale 1 application into the lungs at bedtime. CPAP with O2     [provider]   Inpatient Medications: Scheduled Meds:  enoxaparin (LOVENOX) injection  40 mg Subcutaneous Q24H   furosemide  40 mg Intravenous Q12H   levothyroxine  25 mcg Oral Daily   mometasone-formoterol  2 puff Inhalation BID   pantoprazole  40 mg Oral q morning   verapamil  240 mg Oral BID   Continuous Infusions:  ceFEPime (MAXIPIME) IV 2 g (06/22/23 1353)   [START ON 06/23/2023] vancomycin     PRN Meds: acetaminophen **OR** acetaminophen, ipratropium-albuterol, ondansetron **OR** ondansetron (ZOFRAN) IV, oxyCODONE  Allergies:    Allergies  Allergen Reactions   Eliquis [Apixaban] Other (See Comments)    Bleeding event   Codeine Other (See Comments)    Cant remember   Social History:   Social History   Socioeconomic History   Marital status: Divorced    Spouse name: Not on file   Number of children: 2   Years of education: 12   Highest education level: Not on file  Occupational History   Occupation: retired  Employer: [employer]  Tobacco Use   Smoking status: Former   Smokeless tobacco: Current    Types: Chew   Tobacco comments:    chews once a day  Vaping Use   Vaping status: Never Used  Substance and Sexual Activity   Alcohol use: No   Drug use: No   Sexual activity: Not Currently  Other Topics Concern   Not on file  Social History Narrative   Not on file   Social Drivers of Health   Financial Resource Strain: Not on file  Food Insecurity: No Food Insecurity (06/22/2023)   Hunger Vital Sign    Worried About Running Out of Food in the Last Year: Never true    Ran Out of Food in the Last Year: Never true  Transportation Needs: No Transportation Needs (06/22/2023)   PRAPARE - Administrator, Civil Service (Medical): No    Lack of Transportation (Non-Medical): No  Physical Activity: Not on file  Stress: Not on file  Social Connections: Socially Isolated (06/22/2023)   Social Connection and Isolation Panel [NHANES]     Frequency of Communication with Friends and Family: Three times a week    Frequency of Social Gatherings with Friends and Family: More than three times a week    Attends Religious Services: Never    Database administrator or Organizations: No    Attends Banker Meetings: Never    Marital Status: Divorced  Catering manager Violence: Not At Risk (06/22/2023)   Humiliation, Afraid, Rape, and Kick questionnaire    Fear of Current or Ex-Partner: No    Emotionally Abused: No    Physically Abused: No    Sexually Abused: No    Family History:   Family History  Problem Relation Age of Onset   CVA Mother    Cancer Father        UNK PRIMARY   Liver cancer Other    Heart disease Other     ROS:  Please see the history of present illness.  All other ROS reviewed and negative.     Physical Exam/Data:   Vitals:   06/22/23 0900 06/22/23 0955 06/22/23 1230 06/22/23 1300  BP:  (!) 147/82 113/70   Pulse:  88 86   Resp:  16 (!) 22   Temp: 97.6 F (36.4 C)   97.9 F (36.6 C)  TempSrc: Oral   Oral  SpO2:  94% 99%   Weight:      Height:       Intake/Output Summary (Last 24 hours) at 06/22/2023 1541 Last data filed at 06/22/2023 0108 Gross per 24 hour  Intake 100.97 ml  Output --  Net 100.97 ml      06/21/2023    1:35 PM 04/20/2023    2:45 PM 10/22/2022    3:45 PM  Last 3 Weights  Weight (lbs) 255 lb -- 243 lb 3.2 oz  Weight (kg) 115.667 kg -- 110.315 kg     Body mass index is 35.57 kg/m.  General: obese male, in no acute distress, on 3 L oxygen via [place] HEENT: normal Vascular: Distal pulses 2+ bilaterally Cardiac:  normal S1, S2; RRR; no murmur  Lungs:  coarse lung sounds bilaterally   Abd: distended abdomen, non-tender  Ext: no edema Musculoskeletal:  No deformities Skin: 1+ pitting edema, chronic venous stasis changes in bilateral LE  Neuro:  no focal abnormalities noted Psych:  Normal affect   Telemetry:  Telemetry was personally reviewed and demonstrates:  sinus  rhythm, HR 80-90d, PVCs   Relevant CV Studies: Echocardiogram 06/22/2023 IMPRESSIONS   1. Left ventricular ejection fraction, by estimation, is 55%. The left  ventricle has normal function. Left ventricular endocardial border not  optimally defined to evaluate regional wall motion. Left ventricular  diastolic parameters are consistent with  Grade I diastolic dysfunction (impaired relaxation).   2. D-shaped septum suggests a degree of RV pressure/volume overload.  Right ventricular systolic function is normal. The right ventricular size  is moderately enlarged. Tricuspid regurgitation signal is inadequate for  assessing PA pressure.   3. The mitral valve was not well visualized. No evidence of mitral valve  regurgitation. No evidence of mitral stenosis.   4. The aortic valve was not well visualized. Aortic valve regurgitation  is not visualized. No aortic stenosis is present.   5. IVC not well-visualized.   6. Technically very difficult study. No structure was well-visualized. I  get the impression that the LV function is normal but there is a degree of  RV dysfunction.   Laboratory Data: High Sensitivity Troponin:   Recent Labs  Lab 06/21/23 1403 06/21/23 1604  TROPONINIHS 11 9     Chemistry Recent Labs  Lab 06/21/23 1403 06/21/23 1723 06/22/23 0525  NA 139 136 137  K 4.3 5.7* 3.7  CL 103  --  108  CO2 23  --  26  GLUCOSE 125*  --  82  BUN 34*  --  30*  CREATININE 1.64*  --  1.47*  CALCIUM 8.6*  --  7.1*  GFRNONAA 41*  --  47*  ANIONGAP 13  --  3*    Recent Labs  Lab 06/21/23 1403  PROT 6.5  ALBUMIN 2.9*  AST 54*  ALT 30  ALKPHOS 182*  BILITOT 0.7   Lipids No results for input(s): "CHOL", "TRIG", "HDL", "LABVLDL", "LDLCALC", "CHOLHDL" in the last 168 hours.  Hematology Recent Labs  Lab 06/21/23 1349 06/21/23 1723 06/22/23 0525  WBC 6.9  --  5.8  RBC 4.57  --  3.70*  HGB 12.9* 13.3 10.6*  HCT 41.3 39.0 33.7*  MCV 90.4  --  91.1  MCH 28.2  --  28.6   MCHC 31.2  --  31.5  RDW 15.9*  --  16.0*  PLT 144*  --  106*   Thyroid No results for input(s): "TSH", "FREET4" in the last 168 hours.  BNP Recent Labs  Lab 06/21/23 1350  BNP 291.1*    DDimer No results for input(s): "DDIMER" in the last 168 hours.  Radiology/Studies:  DG Chest Port 1 View Result Date: 06/22/2023 CLINICAL DATA:  Shortness of breath EXAM: PORTABLE CHEST 1 VIEW COMPARISON:  06/21/2023 FINDINGS: The heart size and mediastinal contours are stable. Aortic atherosclerosis. Increasing patchy airspace opacities, most pronounced within the bibasilar regions, right worse than left. No pleural effusion or pneumothorax. IMPRESSION: Increasing patchy airspace opacities, most pronounced within the bibasilar regions. Appearance concerning for multifocal infection. Electronically Signed   By: Duanne Guess D.O.   On: 06/22/2023 13:30   ECHOCARDIOGRAM COMPLETE Result Date: 06/22/2023    ECHOCARDIOGRAM REPORT   Patient Name:   SHAINE NEWMARK Date of Exam: 06/22/2023 Medical Rec #:  [id number]    Height:       71.0 in Accession #:    [id number]   Weight:       255.0 lb Date of Birth:  1940-11-18    BSA:          2.338 m  Patient Age:    82 years     BP:           143/66 mmHg Patient Gender: M            HR:           107 bpm. Exam Location:  Inpatient Procedure: 2D Echo, Cardiac Doppler and Color Doppler (Both Spectral and Color            Flow Doppler were utilized during procedure). Indications:    CHF I50.9  History:        Patient has no prior history of Echocardiogram examinations.                 CHF, COPD; Risk Factors:Dyslipidemia and Hypertension.  Sonographer:    Harriette Bouillon RDCS Referring Phys: [id number] ROBERT DORRELL IMPRESSIONS  1. Left ventricular ejection fraction, by estimation, is 55%. The left ventricle has normal function. Left ventricular endocardial border not optimally defined to evaluate regional wall motion. Left ventricular diastolic parameters are consistent with Grade I  diastolic dysfunction (impaired relaxation).  2. D-shaped septum suggests a degree of RV pressure/volume overload. Right ventricular systolic function is normal. The right ventricular size is moderately enlarged. Tricuspid regurgitation signal is inadequate for assessing PA pressure.  3. The mitral valve was not well visualized. No evidence of mitral valve regurgitation. No evidence of mitral stenosis.  4. The aortic valve was not well visualized. Aortic valve regurgitation is not visualized. No aortic stenosis is present.  5. IVC not well-visualized.  6. Technically very difficult study. No structure was well-visualized. I get the impression that the LV function is normal but there is a degree of RV dysfunction. FINDINGS  Left Ventricle: Left ventricular ejection fraction, by estimation, is 55%. The left ventricle has normal function. Left ventricular endocardial border not optimally defined to evaluate regional wall motion. The left ventricular internal cavity size was normal in size. There is no left ventricular hypertrophy. Left ventricular diastolic parameters are consistent with Grade I diastolic dysfunction (impaired relaxation). Right Ventricle: D-shaped septum suggests a degree of RV pressure/volume overload. The right ventricular size is moderately enlarged. Right vetricular wall thickness was not well visualized. Right ventricular systolic function is normal. Tricuspid regurgitation signal is inadequate for assessing PA pressure. Left Atrium: Left atrial size was normal in size. Right Atrium: Right atrial size was not well visualized. Pericardium: There is no evidence of pericardial effusion. Mitral Valve: The mitral valve was not well visualized. Mild mitral annular calcification. No evidence of mitral valve regurgitation. No evidence of mitral valve stenosis. Tricuspid Valve: The tricuspid valve is not well visualized. Tricuspid valve regurgitation is not demonstrated. Aortic Valve: The aortic valve was  not well visualized. Aortic valve regurgitation is not visualized. No aortic stenosis is present. Pulmonic Valve: The pulmonic valve was not well visualized. Pulmonic valve regurgitation is not visualized. Aorta: The aortic root is normal in size and structure. Venous: IVC not well-visualized. The inferior vena cava was not well visualized. IAS/Shunts: The interatrial septum was not well visualized.  LEFT VENTRICLE PLAX 2D LVIDd:         4.60 cm LVIDs:         3.50 cm LV PW:         1.10 cm LV IVS:        1.00 cm LVOT diam:     2.00 cm LV SV:         33 LV SV Index:   14 LVOT Area:  3.14 cm  RIGHT VENTRICLE         IVC TAPSE (M-mode): 1.7 cm  IVC diam: 2.80 cm LEFT ATRIUM         Index LA diam:    4.60 cm 1.97 cm/m  AORTIC VALVE LVOT Vmax:   75.90 cm/s LVOT Vmean:  44.600 cm/s LVOT VTI:    0.104 m  AORTA Ao Root diam: 3.50 cm MITRAL VALVE MV Area (PHT): 2.60 cm    SHUNTS MV Decel Time: 292 msec    Systemic VTI:  0.10 m MV E velocity: 49.30 cm/s  Systemic Diam: 2.00 cm MV A velocity: 82.80 cm/s MV E/A ratio:  0.60 Dalton McleanMD Electronically signed by Wilfred Lacy Signature Date/Time: 06/22/2023/11:16:14 AM    Final    DG Chest 2 View Result Date: 06/21/2023 CLINICAL DATA:  Leg swelling EXAM: CHEST - 2 VIEW COMPARISON:  Chest x-ray 05/12/2023 FINDINGS: There is a small left pleural effusion. There some patchy opacities in the right costophrenic angle and left lung base. The heart is enlarged. There central pulmonary vascular congestion. No pneumothorax or acute fracture. IMPRESSION: 1. Small left pleural effusion. 2. Patchy opacities in the right costophrenic angle and left lung base may represent atelectasis or infection. 3. Cardiomegaly with central pulmonary vascular congestion. Electronically Signed   By: Darliss Cheney M.D.   On: 06/21/2023 18:13   Assessment and Plan:   Acute on chronic HFpEF Echo this admission showed EF 55%, G1DD, D-shaped septum, moderately enlarged RV LHC 03/2016: no  significant obstructive CAD Received IV Lasix 80 mg this AM and scheduled to get 40 mg this afternoon Urine output not accurately charted Weight entered at 255 lb  Creatinine decreased 1.64 to 1.47 with some IV Lasix   Patient currently on 3 L oxygen via [place] -- has COPD and wears oxygen at baseline  Continue IV Lasix 40 mg BID today -- will monitor response and consider adjusting dose tomorrow pending labs Continue strict I&O's, daily weights, BMPs  Hypertension Most recent BP 113/70 Continue verapamil 240 mg BID  Continue Lasix   History of VT with loss of preexcitation  S/p incomplete ablation of left lateral accessory pathway 03/2016 Patient denies any symptoms in recent past  Continue verapamil 240 mg BID  Per primary CKD stage 3b Bacterial PNA Acute on chronic respiratory failure COPD OSA Liver cirrhosis RCC Anxiety   Risk Assessment/Risk Scores:     New York Heart Association (NYHA) Functional Class NYHA Class III  For questions or updates, please contact [facility] Please consult [email or web address] for contact info under  Signed, Olena Leatherwood, PA-C  06/22/2023 3:41 PM  Patient seen and examined, note reviewed with the signed Advanced Practice Provider. I personally reviewed laboratory data, imaging studies and relevant notes. I independently examined the patient and formulated the important aspects of the plan. I have personally discussed the plan with the patient and/or family. Comments or changes to the note/plan are indicated below.  Patient seen and examined at his bedside in the ED.   GEN:  Well nourished, well developed in no acute distress HEENT: Mucous membranes moist, good dentition NECK: No JVD; No carotid bruits LYMPHATICS: No lymphadenopathy CARDIAC: S1S2 noted, RRR, no murmurs, rubs, gallops RESPIRATORY:  Clear to auscultation without rales, wheezing or rhonchi  ABDOMEN: Soft, non-tender, non-distended, bowel sounds noted, no  guarding EXTREMITIES:No cyanosis, no cyanosis, no clubbing MUSCULOSKELETAL: No deformity  SKIN: Warm and dry NEUROLOGIC:  Alert and oriented x 3, nonfocal PSYCHIATRIC:  Normal affect, good insight  A/P  Acute on chronic heart failure with preserved ejection fraction Hypertension History of VT with loss of exacerbation, status post complete ablation of left lateral accessory pathway CKD stage 3b Bacterial PNA - managed by the primary team    Clinically he appears to be volume overloaded but has to been started on IV Lasix.  We will continue current dose for now and reassess tomorrow for need of adjustment.  His creatinine is improving, continue strict input and output.   Blood pressure is at target,continue with current medication regimen.  In sinus rhythm, please continue with her current verapamil dose.   We will continue to follow with you.    Thomasene Ripple DO, MS [facility] Attending Cardiologist [facility]  [address] [place], [place] [address] [phone] Website: [email or web address]

## 2023-06-22 NOTE — Hospital Course (Addendum)
 Mr. Shutes was admitted to the hospital with the working diagnosis of heart failure decompensation.   82 yom w/ HTN HLD, CAD, PE, CKD stage IIIb, renal cell cancer who presented to ED w/ complaints of dizziness, fatigue, lightheadedness, difficulty ambulating and dyspnea.  Progressively worsening edema of his lower extremities and abdomen over the last week , treated at his nursing home for recurrent pneumonia with multiple rounds of antibiotics without improvement. On his initial physical examination his blood pressure was 120/69, HR 73, RR 16 and 02 saturation 97%.  Lungs with decreased breath sounds and increased work of breathing, heart with S1 and S2 present and regular, no gallops, abdomen distended with ascites, positive lower extremity edema +++  Na 139, K 4.3 CL 103 bicarbonate 23, glucose 125, bun 34 cr 1,65  AST 54, ALT 30  BNP 291  High sensitive troponin 11 and 9  Wbc 6,9 hgb 12,9 plt 144  Sars covid 19 negative  Influenza negative   Chest radiograph with hypoinflation, cardiomegaly, with bilateral hilar vascular congestion, bilateral cephalization of the vasculature, small bilateral pleural effusions.  EKG 74 bpm, normal axis, normal intervals qtc 450, sinus rhythm with 1st degree AV block, no significant ST segment changes and no significant T wave changes, positive low voltage.   03/17 paracentesis 5.0 L removed.  03/20 patient has responded well to diuresis, plan to transfer to SNF.  03/21 paracentesis with 10 L removed.  03/22 worsening renal function, recurrent ascites and advanced heart failure, patient with prognosis. Discussed with his daughter at the bedside, she agrees in checking if he will be candidate for residential hospice.  03/23 continue conservative supportive medical therapy.  03/24 patient more stable today, transitioned to oral diuretic therapy, possible transfer to SNF tomorrow. Will add palliative care.

## 2023-06-22 NOTE — H&P (Signed)
 History and Physical    Chad Hogan:[id number] DOB: 1941/01/08 DOA: 06/21/2023  PCP: Casper Harrison [name], MD   Chief Complaint:  leg swelling, tiredness, SOB  HPI: Chad Hogan is a 83 y.o. male with medical history significant of hypertension, hyperlipidemia, CAD, PE, CKD stage IIIb who presents emergency department due to dizziness tiredness and lightheadedness.  Patient has been having difficulty ambulating due to excessive tiredness and shortness of breath.  He has noticed progressively worsening swelling in his legs and abdomen over the last week.  He has been treated at his nursing home for recurrent pneumonia with multiple rounds of antibiotics without improvement.  He was brought to the ER for further assessment.  On arrival he was afebrile and hemodynamically stable.  Labs were obtained which demonstrated WBC 6.9, hemoglobin 12.9, platelets 144, BNP 291, creatinine 1.6, AST 54, ALP 182, troponin 11, 9.  Respiratory viral panel was negative.  Urinalysis was negative for infection.  Patient underwent a chest x-ray which demonstrated left-sided pleural effusion and opacities as well as pulmonary venous congestion.  Patient was started on IV antibiotics admitted for further workup.  On evaluation he was dyspneic with any speech.  He also had pursing of his lips.  He denies any fevers or chills.   Review of Systems: Review of Systems  Constitutional: Negative.  Negative for chills and fever.  HENT: Negative.    Eyes: Negative.   Respiratory: Negative.  Negative for cough and shortness of breath.   Cardiovascular: Negative.   Gastrointestinal: Negative.   Genitourinary: Negative.   Musculoskeletal: Negative.   Skin: Negative.   Neurological: Negative.   Endo/Heme/Allergies: Negative.   Psychiatric/Behavioral: Negative.       As per HPI otherwise 10 point review of systems negative.   Allergies  Allergen Reactions   Eliquis [Apixaban] Other (See Comments)    Bleeding event    Codeine Other (See Comments)    Cant remember    Past Medical History:  Diagnosis Date   Anxiety    CAP (community acquired pneumonia) 12/2016   CKD (chronic kidney disease) stage 3, GFR 30-59 ml/min (HCC)    COPD (chronic obstructive pulmonary disease) (HCC)    Diastolic CHF, chronic (HCC)    HLD (hyperlipidemia)    HTN (hypertension)    Ischemic heart disease    Obesity    OSA (obstructive sleep apnea)    Palpitation    Pulmonary embolism (HCC)    PVC (premature ventricular contraction)    SVT (supraventricular tachycardia) (HCC)    Umbilical hernia    WPW (Wolff-Parkinson-White syndrome)     Past Surgical History:  Procedure Laterality Date   CARDIAC CATHETERIZATION N/A 03/16/2016   Procedure: Left Heart Cath and Coronary Angiography;  Surgeon: Lennette Bihari, MD;  Location: [facility] INVASIVE CV LAB;  Service: Cardiovascular;  Laterality: N/A;   COLONOSCOPY WITH PROPOFOL N/A 06/11/2014   Procedure: COLONOSCOPY WITH PROPOFOL;  Surgeon: Louis Meckel, MD;  Location: [facility] ENDOSCOPY;  Service: Endoscopy;  Laterality: N/A;   ELECTROPHYSIOLOGIC STUDY N/A 03/23/2016   Procedure: Electrophysiology Study;  Surgeon: Marinus Maw, MD;  Location: [facility] INVASIVE CV LAB;  Service: Cardiovascular;  Laterality: N/A;   ELECTROPHYSIOLOGIC STUDY N/A 03/23/2016   Procedure: SVT Ablation;  Surgeon: Marinus Maw, MD;  Location: [facility] INVASIVE CV LAB;  Service: Cardiovascular;  Laterality: N/A;   ESOPHAGOGASTRODUODENOSCOPY (EGD) WITH PROPOFOL N/A 06/11/2014   Procedure: ESOPHAGOGASTRODUODENOSCOPY (EGD) WITH PROPOFOL;  Surgeon: Louis Meckel, MD;  Location: [facility] ENDOSCOPY;  Service: Endoscopy;  Laterality: N/A;   HERNIA REPAIR       reports that he has quit smoking. His smokeless tobacco use includes chew. He reports that he does not drink alcohol and does not use drugs.  Family History  Problem Relation Age of Onset   CVA Mother    Cancer Father        UNK PRIMARY   Liver cancer Other    Heart  disease Other     Prior to Admission medications   Medication Sig Start Date End Date Taking? Authorizing Provider  ALPRAZolam Prudy Feeler) 1 MG tablet Take 1 tablet (1 mg total) by mouth 2 (two) times daily as needed for anxiety. 12/30/16   Lonia Blood, MD  famotidine (PEPCID) 40 MG tablet Take 40 mg by mouth daily.    [provider]  fluticasone-salmeterol (ADVAIR) 250-50 MCG/ACT AEPB Inhale 1 puff into the lungs 2 (two) times daily. 09/30/21   [provider]  levETIRAcetam (KEPPRA) 500 MG tablet Take by mouth. 04/27/23   [provider]  lidocaine 4 % Apply topically. 04/28/23 07/27/23  [provider]  OVER THE COUNTER MEDICATION Inhale 1 application into the lungs at bedtime. CPAP with O2    [provider]  potassium chloride SA (K-DUR,KLOR-CON) 20 MEQ tablet Take 2 tablets (40 mEq total) by mouth daily. 01/01/17   Drema Dallas, MD  rosuvastatin (CRESTOR) 10 MG tablet Take 1 tablet (10 mg total) by mouth at bedtime. 01/01/17   Drema Dallas, MD  senna (SENOKOT) 8.6 MG tablet Take by mouth. 04/27/23 07/26/23  [provider]  torsemide (DEMADEX) 20 MG tablet Take 2 tablets twice daily for 5 days, then decrease to 2 tablets once daily (40mg  total) thereafter 11/11/22   [name], MD  verapamil (CALAN-SR) 240 MG CR tablet Take 1 tablet (240 mg total) by mouth 2 (two) times daily. 12/30/22   [name], MD    Physical Exam: Vitals:   06/21/23 1334 06/21/23 1335 06/21/23 1845 06/22/23 0118  BP: 120/69  123/69   Pulse: 73  74   Resp: 16  18   Temp: 97.8 F (36.6 C)  98.1 F (36.7 C) 97.9 F (36.6 C)  TempSrc:   Oral Oral  SpO2: 97%  98%   Weight:  115.7 kg    Height:  5\' 11"  (1.803 m)     Physical Exam Constitutional:      Appearance: He is normal weight.  HENT:     Head: Normocephalic.     Nose: Nose normal.     Mouth/Throat:     Mouth: Mucous membranes are moist.     Pharynx: Oropharynx is clear.  Eyes:      Conjunctiva/sclera: Conjunctivae normal.     Pupils: Pupils are equal, round, and reactive to light.  Cardiovascular:     Rate and Rhythm: Normal rate and regular rhythm.     Pulses: Normal pulses.     Heart sounds: Normal heart sounds.  Pulmonary:     Effort: Respiratory distress present.     Breath sounds: Normal breath sounds.  Abdominal:     General: Abdomen is flat. Bowel sounds are normal.  Musculoskeletal:        General: Normal range of motion.     Cervical back: Normal range of motion.  Skin:    Capillary Refill: Capillary refill takes less than 2 seconds.  Neurological:     General: No focal deficit present.  Mental Status: He is alert.      Labs on Admission: I have personally reviewed the patients's labs and imaging studies.  Assessment/Plan Principal Problem:   Heart failure (HCC)   # Acute on chronic hypoxic tori failure most likely secondary to volume overload/bacterial commune acquired pneumonia, POA, active - Patient required supplemental oxygen in emergency department - Patient on 3 L oxygen at baseline requiring 5 L - Swelling of legs and pleural effusion on chest x-ray  Plan: Treat with vancomycin and cefepime to cover nosocomial organisms given that he lives in a nursing facility. Continue IV Lasix Check labs in morning Obtain TTE  # Renal cell carcinoma-continue to monitor  # Cirrhosis-patient has cryptogenic cirrhosis.  May be possible etiology of patient's volume overload.  Will continue IV Lasix and monitor renal function  # Hyperlipidemia-continue Crestor 10 mg daily  # Hypertension # History of SVT/Wolff-Parkinson-White --continue verapamil  # COPD-patient states that he smoked for 3 years.  Previously diagnosed by outside pulmonologist  # Chronic anemia-continue to monitor  # Thrombocytopenia - Most likely related to cirrhosis   Admission status: Inpatient Telemetry Cardiac  Certification: The appropriate patient status for  this patient is INPATIENT. Inpatient status is judged to be reasonable and necessary in order to provide the required intensity of service to ensure the patient's safety. The patient's presenting symptoms, physical exam findings, and initial radiographic and laboratory data in the context of their chronic comorbidities is felt to place them at high risk for further clinical deterioration. Furthermore, it is not anticipated that the patient will be medically stable for discharge from the hospital within 2 midnights of admission.   * I certify that at the point of admission it is my clinical judgment that the patient will require inpatient hospital care spanning beyond 2 midnights from the point of admission due to high intensity of service, high risk for further deterioration and high frequency of surveillance required.Alan Mulder MD [facility] If 7PM-7AM, please contact night-coverage [email or web address]  06/22/2023, 1:34 AM

## 2023-06-22 NOTE — Progress Notes (Signed)
  Echocardiogram 2D Echocardiogram has been performed.  Leda Roys RDCS 06/22/2023, 11:02 AM

## 2023-06-22 NOTE — Progress Notes (Signed)
 Pharmacy Antibiotic Note  Chad Hogan is a 83 y.o. male admitted on 06/21/2023 with pneumonia.  Pharmacy has been consulted for cefepime and vancomycin dosing.  Plan: Cefepime 2g q12h Vancomycin 2g load > vancomycin 1500mg  q24h (eAUC 510, scr 1.64, Vd 0.5) F/u renal function, infectious work up and length of therapy  Vancomycin levels as needed  Height: 5\' 11"  (180.3 cm) Weight: 115.7 kg (255 lb) IBW/kg (Calculated) : 75.3  Temp (24hrs), Avg:97.9 F (36.6 C), Min:97.8 F (36.6 C), Max:98.1 F (36.7 C)  Recent Labs  Lab 06/21/23 1349 06/21/23 1403  WBC 6.9  --   CREATININE  --  1.64*    Estimated Creatinine Clearance: 44.2 mL/min (A) (by C-G formula based on SCr of 1.64 mg/dL (H)).    Allergies  Allergen Reactions   Eliquis [Apixaban] Other (See Comments)    Bleeding event   Codeine Other (See Comments)    Cant remember   Thank you for allowing pharmacy to be a part of this patient's care.  Marja Kays 06/22/2023 1:43 AM

## 2023-06-22 NOTE — ED Notes (Signed)
 Pharmacy messaged regarding missing dulera inhaler.

## 2023-06-22 NOTE — ED Notes (Addendum)
 Pt assisted to bedside commode, 1 urine and stool occurrence

## 2023-06-23 DIAGNOSIS — I5033 Acute on chronic diastolic (congestive) heart failure: Secondary | ICD-10-CM | POA: Diagnosis not present

## 2023-06-23 LAB — CBC
HCT: 33.5 % — ABNORMAL LOW (ref 39.0–52.0)
Hemoglobin: 10.6 g/dL — ABNORMAL LOW (ref 13.0–17.0)
MCH: 28 pg (ref 26.0–34.0)
MCHC: 31.6 g/dL (ref 30.0–36.0)
MCV: 88.6 fL (ref 80.0–100.0)
Platelets: 115 10*3/uL — ABNORMAL LOW (ref 150–400)
RBC: 3.78 MIL/uL — ABNORMAL LOW (ref 4.22–5.81)
RDW: 16.1 % — ABNORMAL HIGH (ref 11.5–15.5)
WBC: 5.1 10*3/uL (ref 4.0–10.5)
nRBC: 0 % (ref 0.0–0.2)

## 2023-06-23 LAB — LIPID PANEL
Cholesterol: 93 mg/dL (ref 0–200)
HDL: 26 mg/dL — ABNORMAL LOW (ref >40)
LDL Cholesterol: 53 mg/dL (ref 0–99)
Total CHOL/HDL Ratio: 3.6 ratio
Triglycerides: 68 mg/dL (ref <150)
VLDL: 14 mg/dL (ref 0–40)

## 2023-06-23 LAB — BASIC METABOLIC PANEL
Anion gap: 12 (ref 5–15)
BUN: 29 mg/dL — ABNORMAL HIGH (ref 8–23)
CO2: 24 mmol/L (ref 22–32)
Calcium: 7.9 mg/dL — ABNORMAL LOW (ref 8.9–10.3)
Chloride: 105 mmol/L (ref 98–111)
Creatinine, Ser: 1.5 mg/dL — ABNORMAL HIGH (ref 0.61–1.24)
GFR, Estimated: 46 mL/min — ABNORMAL LOW (ref >60)
Glucose, Bld: 87 mg/dL (ref 70–99)
Potassium: 3.4 mmol/L — ABNORMAL LOW (ref 3.5–5.1)
Sodium: 141 mmol/L (ref 135–145)

## 2023-06-23 MED ORDER — AZITHROMYCIN 250 MG PO TABS
500.0000 mg | ORAL_TABLET | Freq: Every day | ORAL | Status: DC
  Administered 2023-06-23 – 2023-06-28 (×6): 500 mg via ORAL
  Filled 2023-06-23 (×6): qty 2

## 2023-06-23 MED ORDER — POTASSIUM CHLORIDE CRYS ER 20 MEQ PO TBCR
40.0000 meq | EXTENDED_RELEASE_TABLET | Freq: Once | ORAL | Status: AC
  Administered 2023-06-23: 40 meq via ORAL
  Filled 2023-06-23: qty 2

## 2023-06-23 MED ORDER — SODIUM CHLORIDE 0.9 % IV SOLN
2.0000 g | INTRAVENOUS | Status: DC
  Administered 2023-06-23 – 2023-06-27 (×5): 2 g via INTRAVENOUS
  Filled 2023-06-23 (×5): qty 20

## 2023-06-23 NOTE — Progress Notes (Signed)
 Progress Note  Patient Name: Chad Hogan Date of Encounter: 06/23/2023  Primary Cardiologist: Sherryl Manges, MD   Subjective   Patient seen examined his bedside.  Offers no complaints at this time.  He was lying in bed.  He was awake.  Inpatient Medications    Scheduled Meds:  enoxaparin (LOVENOX) injection  40 mg Subcutaneous Q24H   furosemide  40 mg Intravenous Q12H   levothyroxine  25 mcg Oral Daily   mometasone-formoterol  2 puff Inhalation BID   pantoprazole  40 mg Oral q morning   verapamil  240 mg Oral BID   Continuous Infusions:  ceFEPime (MAXIPIME) IV 2 g (06/23/23 0240)   vancomycin 1,500 mg (06/23/23 0319)   PRN Meds: acetaminophen **OR** acetaminophen, ipratropium-albuterol, ondansetron **OR** ondansetron (ZOFRAN) IV, oxyCODONE   Vital Signs    Vitals:   06/22/23 1958 06/23/23 0141 06/23/23 0512 06/23/23 0714  BP:  (!) 117/58 118/62 (!) 114/58  Pulse: 77 72 66 67  Resp: 17 20 (!) 24 20  Temp:  97.6 F (36.4 C) 97.9 F (36.6 C) 97.7 F (36.5 C)  TempSrc:  Oral Oral Oral  SpO2: 97% 90% 93% 95%  Weight:   113.4 kg   Height:        Intake/Output Summary (Last 24 hours) at 06/23/2023 0805 Last data filed at 06/23/2023 0147 Gross per 24 hour  Intake 199.15 ml  Output 300 ml  Net -100.85 ml   Filed Weights   06/21/23 1335 06/22/23 1707 06/23/23 0512  Weight: 115.7 kg 114.7 kg 113.4 kg    Telemetry    Sinus rhythm- Personally Reviewed  ECG     - Personally Reviewed  Physical Exam    General: Comfortable, sitting up in a chair Head: Atraumatic, normal size  Eyes: PEERLA, EOMI  Neck: Supple, normal JVD Cardiac: Normal S1, S2; RRR; no murmurs, rubs, or gallops Lungs: Clear to auscultation bilaterally Abd: Soft, nontender, no hepatomegaly  Ext: warm, +3 bilateral peripheral edema/bilateral redness however no tenderness. Musculoskeletal: No deformities, BUE and BLE strength normal and equal Skin: Warm and dry, no rashes   Neuro: Alert and  oriented to person, place, time, and situation, CNII-XII grossly intact, no focal deficits  Psych: Normal mood and affect   Labs    Chemistry Recent Labs  Lab 06/21/23 1403 06/21/23 1723 06/22/23 0525 06/23/23 0233  NA 139 136 137 141  K 4.3 5.7* 3.7 3.4*  CL 103  --  108 105  CO2 23  --  26 24  GLUCOSE 125*  --  82 87  BUN 34*  --  30* 29*  CREATININE 1.64*  --  1.47* 1.50*  CALCIUM 8.6*  --  7.1* 7.9*  PROT 6.5  --   --   --   ALBUMIN 2.9*  --   --   --   AST 54*  --   --   --   ALT 30  --   --   --   ALKPHOS 182*  --   --   --   BILITOT 0.7  --   --   --   GFRNONAA 41*  --  47* 46*  ANIONGAP 13  --  3* 12     Hematology Recent Labs  Lab 06/21/23 1349 06/21/23 1723 06/22/23 0525 06/23/23 0233  WBC 6.9  --  5.8 5.1  RBC 4.57  --  3.70* 3.78*  HGB 12.9* 13.3 10.6* 10.6*  HCT 41.3 39.0 33.7* 33.5*  MCV 90.4  --  91.1 88.6  MCH 28.2  --  28.6 28.0  MCHC 31.2  --  31.5 31.6  RDW 15.9*  --  16.0* 16.1*  PLT 144*  --  106* 115*    Cardiac EnzymesNo results for input(s): "TROPONINI" in the last 168 hours. No results for input(s): "TROPIPOC" in the last 168 hours.   BNP Recent Labs  Lab 06/21/23 1350  BNP 291.1*     DDimer No results for input(s): "DDIMER" in the last 168 hours.   Radiology    DG Chest Port 1 View Result Date: 06/22/2023 CLINICAL DATA:  Shortness of breath EXAM: PORTABLE CHEST 1 VIEW COMPARISON:  06/21/2023 FINDINGS: The heart size and mediastinal contours are stable. Aortic atherosclerosis. Increasing patchy airspace opacities, most pronounced within the bibasilar regions, right worse than left. No pleural effusion or pneumothorax. IMPRESSION: Increasing patchy airspace opacities, most pronounced within the bibasilar regions. Appearance concerning for multifocal infection. Electronically Signed   By: Duanne Guess D.O.   On: 06/22/2023 13:30   ECHOCARDIOGRAM COMPLETE Result Date: 06/22/2023    ECHOCARDIOGRAM REPORT   Patient Name:   JEYDAN BARNER Date of Exam: 06/22/2023 Medical Rec #:  [id number]    Height:       71.0 in Accession #:    [id number]   Weight:       255.0 lb Date of Birth:  08-10-1940    BSA:          2.338 m Patient Age:    3 years     BP:           143/66 mmHg Patient Gender: M            HR:           107 bpm. Exam Location:  Inpatient Procedure: 2D Echo, Cardiac Doppler and Color Doppler (Both Spectral and Color            Flow Doppler were utilized during procedure). Indications:    CHF I50.9  History:        Patient has no prior history of Echocardiogram examinations.                 CHF, COPD; Risk Factors:Dyslipidemia and Hypertension.  Sonographer:    Harriette Bouillon RDCS Referring Phys: [id number] ROBERT DORRELL IMPRESSIONS  1. Left ventricular ejection fraction, by estimation, is 55%. The left ventricle has normal function. Left ventricular endocardial border not optimally defined to evaluate regional wall motion. Left ventricular diastolic parameters are consistent with Grade I diastolic dysfunction (impaired relaxation).  2. D-shaped septum suggests a degree of RV pressure/volume overload. Right ventricular systolic function is normal. The right ventricular size is moderately enlarged. Tricuspid regurgitation signal is inadequate for assessing PA pressure.  3. The mitral valve was not well visualized. No evidence of mitral valve regurgitation. No evidence of mitral stenosis.  4. The aortic valve was not well visualized. Aortic valve regurgitation is not visualized. No aortic stenosis is present.  5. IVC not well-visualized.  6. Technically very difficult study. No structure was well-visualized. I get the impression that the LV function is normal but there is a degree of RV dysfunction. FINDINGS  Left Ventricle: Left ventricular ejection fraction, by estimation, is 55%. The left ventricle has normal function. Left ventricular endocardial border not optimally defined to evaluate regional wall motion. The left ventricular internal  cavity size was normal in size. There is no left ventricular hypertrophy. Left ventricular diastolic parameters are  consistent with Grade I diastolic dysfunction (impaired relaxation). Right Ventricle: D-shaped septum suggests a degree of RV pressure/volume overload. The right ventricular size is moderately enlarged. Right vetricular wall thickness was not well visualized. Right ventricular systolic function is normal. Tricuspid regurgitation signal is inadequate for assessing PA pressure. Left Atrium: Left atrial size was normal in size. Right Atrium: Right atrial size was not well visualized. Pericardium: There is no evidence of pericardial effusion. Mitral Valve: The mitral valve was not well visualized. Mild mitral annular calcification. No evidence of mitral valve regurgitation. No evidence of mitral valve stenosis. Tricuspid Valve: The tricuspid valve is not well visualized. Tricuspid valve regurgitation is not demonstrated. Aortic Valve: The aortic valve was not well visualized. Aortic valve regurgitation is not visualized. No aortic stenosis is present. Pulmonic Valve: The pulmonic valve was not well visualized. Pulmonic valve regurgitation is not visualized. Aorta: The aortic root is normal in size and structure. Venous: IVC not well-visualized. The inferior vena cava was not well visualized. IAS/Shunts: The interatrial septum was not well visualized.  LEFT VENTRICLE PLAX 2D LVIDd:         4.60 cm LVIDs:         3.50 cm LV PW:         1.10 cm LV IVS:        1.00 cm LVOT diam:     2.00 cm LV SV:         33 LV SV Index:   14 LVOT Area:     3.14 cm  RIGHT VENTRICLE         IVC TAPSE (M-mode): 1.7 cm  IVC diam: 2.80 cm LEFT ATRIUM         Index LA diam:    4.60 cm 1.97 cm/m  AORTIC VALVE LVOT Vmax:   75.90 cm/s LVOT Vmean:  44.600 cm/s LVOT VTI:    0.104 m  AORTA Ao Root diam: 3.50 cm MITRAL VALVE MV Area (PHT): 2.60 cm    SHUNTS MV Decel Time: 292 msec    Systemic VTI:  0.10 m MV E velocity: 49.30 cm/s   Systemic Diam: 2.00 cm MV A velocity: 82.80 cm/s MV E/A ratio:  0.60 Dalton McleanMD Electronically signed by Wilfred Lacy Signature Date/Time: 06/22/2023/11:16:14 AM    Final    DG Chest 2 View Result Date: 06/21/2023 CLINICAL DATA:  Leg swelling EXAM: CHEST - 2 VIEW COMPARISON:  Chest x-ray 05/12/2023 FINDINGS: There is a small left pleural effusion. There some patchy opacities in the right costophrenic angle and left lung base. The heart is enlarged. There central pulmonary vascular congestion. No pneumothorax or acute fracture. IMPRESSION: 1. Small left pleural effusion. 2. Patchy opacities in the right costophrenic angle and left lung base may represent atelectasis or infection. 3. Cardiomegaly with central pulmonary vascular congestion. Electronically Signed   By: Darliss Cheney M.D.   On: 06/21/2023 18:13    Cardiac Studies   Reviewed recent echocardiogram  Patient Profile     83 y.o. male history of HTN, HLD, CAD, history of PE, CKD stage 3b, SVT, WPW, COPD, HFpEF, OSA on CPAP, RCC, liver cirrhosis, recent SAH s/p fall. He presented to the [facility] ED on 06/21/2023 complaining of leg swelling, weakness, dizziness, increased shortness of breath.   Assessment & Plan    Acute on chronic heart failure with preserved ejection fraction Hypertension History of VT with loss of exacerbation, status post complete ablation of left lateral accessory pathway CKD stage 3b Bacterial PNA -  managed by the primary team   Clinically his physical exam has slightly improved his abdomen is no longer significantly tight as it was yesterday but he does have lower extremity edema -there is some improvement from his physical exam unfortunately his I's and O's are not accurate.  I will just keep the patient on the current dose of Lasix and monitor his I's and O's and daily weights and adjust as appropriate.  Creatinine stable will continue to monitor this is why he is on diuretics.  Blood pressure is at  target,continue with current medication regimen.  In sinus rhythm, please continue with her current verapamil dose.    We will continue to follow with you.      For questions or updates, please contact [facility] Please consult [email or web address] for contact info under Cardiology/STEMI.      Signed, Kafi Dotter, DO  06/23/2023, 8:05 AM

## 2023-06-23 NOTE — Plan of Care (Signed)

## 2023-06-23 NOTE — Plan of Care (Signed)
  Problem: Clinical Measurements: Goal: Ability to maintain clinical measurements within normal limits will improve Outcome: Progressing Goal: Respiratory complications will improve Outcome: Progressing Goal: Cardiovascular complication will be avoided Outcome: Progressing   Problem: Elimination: Goal: Will not experience complications related to bowel motility Outcome: Progressing

## 2023-06-23 NOTE — Progress Notes (Signed)
 PROGRESS NOTE Chad Hogan  ZOX:[id number] DOB: 1940/06/21 DOA: 06/21/2023 PCP: Casper Harrison, [name], MD  Brief Narrative/Hospital Course: 83 yom w/ HTN HLD, CAD, PE, CKD stage IIIb who presented to ED w/ complaints of dizziness tiredness and lightheadedness  and difficulty ambulating and shortness of breath and progressively worsening swelling in his legs and abdomen over the last week , treated at his nursing home for recurrent pneumonia with multiple rounds of antibiotics without improvement. In the ED hemodynamically stable afebrile labs reviewed normal wbc, somewhat elevated BNP 291 creatinine 1.6 AST 54 ALP 102 troponin 11>9 9, chest x-ray left-sided pleural effusion and opacities as well as pulmonary venous congestion and patient not admitted for further workup, started on IV antibiotics given Lasix 40 mg.  Consultation: Cardiology    Subjective: Seen and examined He feels some improvement Still has distended abdomen and edematous legs On 3 L nasal cannula Overnight afebrile BP stable  Labs shows hypokalemia 3.4 creatinine stable 1.5, stable anemia and improving thrombocytopenia   Assessment and Plan: Principal Problem:   Heart failure (HCC) Active Problems:   Morbid obesity (HCC)   Essential hypertension   Acute on chronic CHF with preserved EF  Acute on chronic hypoxic respiratory failure Fluid overload: presentation likely multifactorial with acute on chronic CHF, pneumonia.   Baseline  on 3 L - needing upto 5 In ED-exam significant for abdominal distention/leg edema.  Congested chest x-ray, has history of liver cirrhosis.BNP somewhat elevated.TTE-shows EF 55% with G1 DD has RV pressure/volume overload with D-shaped septum Cardiology input appreciated continue current IV Lasix 40 mg bid Intake output charting inaccurate - strict I/o in place Cont to monitor daily I/O,weight, electrolytes and net balance as below.Keep on  salt/fluid restricted diet and monitor in  tele. Net IO Since Admission: 60.12 mL [06/23/23 0856]  Filed Weights   06/21/23 1335 06/22/23 1707 06/23/23 0512  Weight: 115.7 kg 114.7 kg 113.4 kg    Recent Labs  Lab 06/21/23 1350 06/21/23 1403 06/21/23 1723 06/22/23 0525 06/23/23 0233  BNP 291.1*  --   --   --   --   BUN  --  34*  --  30* 29*  CREATININE  --  1.64*  --  1.47* 1.50*  K  --  4.3 5.7* 3.7 3.4*    Bacterial pneumonia: recently treated with multiple rounds of antibiotics at nursing facility.  He had no leukocytosis and procalcitonin less than 0.17 admitted with vancomycin/cefepime>  de-escalate antibiotics to cover CAP, short course-pharmacy consulted Recent Labs  Lab 06/21/23 1349 06/22/23 0525 06/23/23 0233  WBC 6.9 5.8 5.1  PROCALCITON  --  0.17  --     RCC history: Follow-up with patient's Urology  Cryptogenic liver cirrhosis: Monitor volume status continue Lasix as above  CKD stage IIIb: Renal function stable-hopefully it will improve with diuresis.   Monitor.   Hypokalemia: Replace orally  Hypertension History of SVT/WPW syndrome HLD: BP is stable on home verapamil, diuretics.  Continue Crestor  Chronic anemia Thrombocytopenia : This finding is likely from liver cirrhosis.  Monitor   COPD: Not in exacerbation. Cont home Dulera  Anxiety: On as needed Xanax continue  Class I Obesity: Patient's Body mass index is 34.87 kg/m. : Will benefit with PCP follow-up, weight loss  healthy lifestyle and outpatient sleep evaluation.   DVT prophylaxis: enoxaparin (LOVENOX) injection 40 mg Start: 06/22/23 1000 SCDs Start: 06/22/23 0013 Code Status:   Code Status: Full Code Family Communication: plan of care discussed with patient/none at  bedside. Patient status is: Remains hospitalized because of severity of illness Level of care: Telemetry Cardiac   Dispo: The patient is from: home-[facility]            Anticipated disposition:TBD Objective: Vitals last 24 hrs: Vitals:   06/22/23 1958  06/23/23 0141 06/23/23 0512 06/23/23 0714  BP:  (!) 117/58 118/62 (!) 114/58  Pulse: 77 72 66 67  Resp: 17 20 (!) 24 20  Temp:  97.6 F (36.4 C) 97.9 F (36.6 C) 97.7 F (36.5 C)  TempSrc:  Oral Oral Oral  SpO2: 97% 90% 93% 95%  Weight:   113.4 kg   Height:       Weight change: -0.967 kg  Physical Examination: General exam: alert awake,at baseline, older than stated age HEENT:Oral mucosa moist, Ear/Nose WNL grossly Respiratory system: Bilaterally clear BS,no use of accessory muscle Cardiovascular system: S1 & S2 +, No JVD. Gastrointestinal system: Abdomen soft, distended obese NT, BS+ Nervous System: Alert, awake, moving all extremities,and following commands. Extremities: LE edema ++ erythema without tenderness ,distal peripheral pulses palpable and warm.  Skin: No rashes,no icterus. MSK: Normal muscle bulk,tone, power   Medications reviewed:  Scheduled Meds:  enoxaparin (LOVENOX) injection  40 mg Subcutaneous Q24H   furosemide  40 mg Intravenous Q12H   levothyroxine  25 mcg Oral Daily   mometasone-formoterol  2 puff Inhalation BID   pantoprazole  40 mg Oral q morning   verapamil  240 mg Oral BID   Continuous Infusions:  ceFEPime (MAXIPIME) IV 2 g (06/23/23 0240)   vancomycin 1,500 mg (06/23/23 0319)    Diet Order             Diet Heart Room service appropriate? Yes; Fluid consistency: Thin; Fluid restriction: 1500 mL Fluid  Diet effective now                  Intake/Output Summary (Last 24 hours) at 06/23/2023 0852 Last data filed at 06/23/2023 0147 Gross per 24 hour  Intake 199.15 ml  Output 300 ml  Net -100.85 ml   Net IO Since Admission: 0.12 mL [06/23/23 0852]  Wt Readings from Last 3 Encounters:  06/23/23 113.4 kg  10/22/22 110.3 kg  10/27/21 111.6 kg     Unresulted Labs (From admission, onward)     Start     Ordered   06/23/23 0500  Basic metabolic panel  Daily,   R      06/22/23 0753   06/23/23 0500  CBC  Daily,   R      06/22/23 0753           Data Reviewed: I have personally reviewed following labs and imaging studies CBC: Recent Labs  Lab 06/21/23 1349 06/21/23 1723 06/22/23 0525 06/23/23 0233  WBC 6.9  --  5.8 5.1  HGB 12.9* 13.3 10.6* 10.6*  HCT 41.3 39.0 33.7* 33.5*  MCV 90.4  --  91.1 88.6  PLT 144*  --  106* 115*   Basic Metabolic Panel:  Recent Labs  Lab 06/21/23 1403 06/21/23 1723 06/22/23 0525 06/23/23 0233  NA 139 136 137 141  K 4.3 5.7* 3.7 3.4*  CL 103  --  108 105  CO2 23  --  26 24  GLUCOSE 125*  --  82 87  BUN 34*  --  30* 29*  CREATININE 1.64*  --  1.47* 1.50*  CALCIUM 8.6*  --  7.1* 7.9*   GFR: Estimated Creatinine Clearance: 47.8 mL/min (A) (by C-G formula  based on SCr of 1.5 mg/dL (H)). Liver Function Tests:  Recent Labs  Lab 06/21/23 1403  AST 54*  ALT 30  ALKPHOS 182*  BILITOT 0.7  PROT 6.5  ALBUMIN 2.9*   No results for input(s): "LIPASE", "AMYLASE" in the last 168 hours.  Recent Labs  Lab 06/22/23 1737  AMMONIA 19   Recent Labs    06/23/23 0233  CHOL 93  HDL 26*  LDLCALC 53  TRIG 68  CHOLHDL 3.6   No results for input(s): "TSH", "T4TOTAL", "FREET4", "T3FREE", "THYROIDAB" in the last 72 hours. Sepsis Labs: Recent Labs  Lab 06/22/23 0525  PROCALCITON 0.17   Recent Results (from the past 240 hours)  Resp panel by RT-PCR (RSV, Flu A&B, Covid) Anterior Nasal Swab     Status: None   Collection Time: 06/21/23  5:17 PM   Specimen: Anterior Nasal Swab  Result Value Ref Range Status   SARS Coronavirus 2 by RT PCR NEGATIVE NEGATIVE Final    Comment: (NOTE) SARS-CoV-2 target nucleic acids are NOT DETECTED.  The SARS-CoV-2 RNA is generally detectable in upper respiratory specimens during the acute phase of infection. The lowest concentration of SARS-CoV-2 viral copies this assay can detect is 138 copies/mL. A negative result does not preclude SARS-Cov-2 infection and should not be used as the sole basis for treatment or other patient management decisions. A  negative result may occur with  improper specimen collection/handling, submission of specimen other than nasopharyngeal swab, presence of viral mutation(s) within the areas targeted by this assay, and inadequate number of viral copies(<138 copies/mL). A negative result must be combined with clinical observations, patient history, and epidemiological information. The expected result is Negative.  Fact Sheet for Patients:  BloggerCourse.com  Fact Sheet for Healthcare Providers:  SeriousBroker.it  This test is no t yet approved or cleared by the [place] FDA and  has been authorized for detection and/or diagnosis of SARS-CoV-2 by FDA under an Emergency Use Authorization (EUA). This EUA will remain  in effect (meaning this test can be used) for the duration of the COVID-19 declaration under Section 564(b)(1) of the Act, 21 U.S.C.section 360bbb-3(b)(1), unless the authorization is terminated  or revoked sooner.       Influenza A by PCR NEGATIVE NEGATIVE Final   Influenza B by PCR NEGATIVE NEGATIVE Final    Comment: (NOTE) The Xpert Xpress SARS-CoV-2/FLU/RSV plus assay is intended as an aid in the diagnosis of influenza from Nasopharyngeal swab specimens and should not be used as a sole basis for treatment. Nasal washings and aspirates are unacceptable for Xpert Xpress SARS-CoV-2/FLU/RSV testing.  Fact Sheet for Patients: BloggerCourse.com  Fact Sheet for Healthcare Providers: SeriousBroker.it  This test is not yet approved or cleared by the [place] FDA and has been authorized for detection and/or diagnosis of SARS-CoV-2 by FDA under an Emergency Use Authorization (EUA). This EUA will remain in effect (meaning this test can be used) for the duration of the COVID-19 declaration under Section 564(b)(1) of the Act, 21 U.S.C. section 360bbb-3(b)(1), unless the authorization  is terminated or revoked.     Resp Syncytial Virus by PCR NEGATIVE NEGATIVE Final    Comment: (NOTE) Fact Sheet for Patients: BloggerCourse.com  Fact Sheet for Healthcare Providers: SeriousBroker.it  This test is not yet approved or cleared by the [place] FDA and has been authorized for detection and/or diagnosis of SARS-CoV-2 by FDA under an Emergency Use Authorization (EUA). This EUA will remain in effect (meaning this test can be used)  for the duration of the COVID-19 declaration under Section 564(b)(1) of the Act, 21 U.S.C. section 360bbb-3(b)(1), unless the authorization is terminated or revoked.  Performed at [facility], [address], [place], [place] [address]   MRSA Next Gen by PCR, Nasal     Status: None   Collection Time: 06/22/23  7:56 AM   Specimen: Nasal Mucosa; Nasal Swab  Result Value Ref Range Status   MRSA by PCR Next Gen NOT DETECTED NOT DETECTED Final    Comment: (NOTE) The GeneXpert MRSA Assay (FDA approved for NASAL specimens only), is one component of a comprehensive MRSA colonization surveillance program. It is not intended to diagnose MRSA infection nor to guide or monitor treatment for MRSA infections. Test performance is not FDA approved in patients less than 60 years old. Performed at [facility], [address], [place], [place] [address]     Antimicrobials/Microbiology: Anti-infectives (From admission, onward)    Start     Dose/Rate Route Frequency Ordered Stop   06/23/23 0300  vancomycin (VANCOREADY) IVPB 1500 mg/300 mL        1,500 mg 150 mL/hr over 120 Minutes Intravenous Every 24 hours 06/22/23 0422     06/22/23 0200  ceFEPIme (MAXIPIME) 2 g in sodium chloride 0.9 % 100 mL IVPB        2 g 200 mL/hr over 30 Minutes Intravenous Every 12 hours 06/22/23 0144     06/22/23 0200  vancomycin (VANCOREADY) IVPB 2000 mg/400 mL        2,000 mg 200 mL/hr over 120  Minutes Intravenous  Once 06/22/23 0144 06/22/23 0511   06/21/23 2300  azithromycin (ZITHROMAX) 500 mg in sodium chloride 0.9 % 250 mL IVPB        500 mg 250 mL/hr over 60 Minutes Intravenous  Once 06/21/23 2252 06/22/23 0235   06/21/23 2300  cefTRIAXone (ROCEPHIN) 1 g in sodium chloride 0.9 % 100 mL IVPB        1 g 200 mL/hr over 30 Minutes Intravenous  Once 06/21/23 2252 06/22/23 0108         Component Value Date/Time   SDES URINE, CLEAN CATCH 12/24/2016 1644   SPECREQUEST NONE 12/24/2016 1644   CULT NO GROWTH 12/24/2016 1644   REPTSTATUS 12/25/2016 FINAL 12/24/2016 1644     Radiology Studies: DG Chest Port 1 View Result Date: 06/22/2023 CLINICAL DATA:  Shortness of breath EXAM: PORTABLE CHEST 1 VIEW COMPARISON:  06/21/2023 FINDINGS: The heart size and mediastinal contours are stable. Aortic atherosclerosis. Increasing patchy airspace opacities, most pronounced within the bibasilar regions, right worse than left. No pleural effusion or pneumothorax. IMPRESSION: Increasing patchy airspace opacities, most pronounced within the bibasilar regions. Appearance concerning for multifocal infection. Electronically Signed   By: Duanne Guess D.O.   On: 06/22/2023 13:30   ECHOCARDIOGRAM COMPLETE Result Date: 06/22/2023    ECHOCARDIOGRAM REPORT   Patient Name:   PRATYUSH AMMON Date of Exam: 06/22/2023 Medical Rec #:  [id number]    Height:       71.0 in Accession #:    [id number]   Weight:       255.0 lb Date of Birth:  May 02, 1940    BSA:          2.338 m Patient Age:    82 years     BP:           143/66 mmHg Patient Gender: M  HR:           107 bpm. Exam Location:  Inpatient Procedure: 2D Echo, Cardiac Doppler and Color Doppler (Both Spectral and Color            Flow Doppler were utilized during procedure). Indications:    CHF I50.9  History:        Patient has no prior history of Echocardiogram examinations.                 CHF, COPD; Risk Factors:Dyslipidemia and Hypertension.  Sonographer:     Harriette Bouillon RDCS Referring Phys: [id number] ROBERT DORRELL IMPRESSIONS  1. Left ventricular ejection fraction, by estimation, is 55%. The left ventricle has normal function. Left ventricular endocardial border not optimally defined to evaluate regional wall motion. Left ventricular diastolic parameters are consistent with Grade I diastolic dysfunction (impaired relaxation).  2. D-shaped septum suggests a degree of RV pressure/volume overload. Right ventricular systolic function is normal. The right ventricular size is moderately enlarged. Tricuspid regurgitation signal is inadequate for assessing PA pressure.  3. The mitral valve was not well visualized. No evidence of mitral valve regurgitation. No evidence of mitral stenosis.  4. The aortic valve was not well visualized. Aortic valve regurgitation is not visualized. No aortic stenosis is present.  5. IVC not well-visualized.  6. Technically very difficult study. No structure was well-visualized. I get the impression that the LV function is normal but there is a degree of RV dysfunction. FINDINGS  Left Ventricle: Left ventricular ejection fraction, by estimation, is 55%. The left ventricle has normal function. Left ventricular endocardial border not optimally defined to evaluate regional wall motion. The left ventricular internal cavity size was normal in size. There is no left ventricular hypertrophy. Left ventricular diastolic parameters are consistent with Grade I diastolic dysfunction (impaired relaxation). Right Ventricle: D-shaped septum suggests a degree of RV pressure/volume overload. The right ventricular size is moderately enlarged. Right vetricular wall thickness was not well visualized. Right ventricular systolic function is normal. Tricuspid regurgitation signal is inadequate for assessing PA pressure. Left Atrium: Left atrial size was normal in size. Right Atrium: Right atrial size was not well visualized. Pericardium: There is no evidence of  pericardial effusion. Mitral Valve: The mitral valve was not well visualized. Mild mitral annular calcification. No evidence of mitral valve regurgitation. No evidence of mitral valve stenosis. Tricuspid Valve: The tricuspid valve is not well visualized. Tricuspid valve regurgitation is not demonstrated. Aortic Valve: The aortic valve was not well visualized. Aortic valve regurgitation is not visualized. No aortic stenosis is present. Pulmonic Valve: The pulmonic valve was not well visualized. Pulmonic valve regurgitation is not visualized. Aorta: The aortic root is normal in size and structure. Venous: IVC not well-visualized. The inferior vena cava was not well visualized. IAS/Shunts: The interatrial septum was not well visualized.  LEFT VENTRICLE PLAX 2D LVIDd:         4.60 cm LVIDs:         3.50 cm LV PW:         1.10 cm LV IVS:        1.00 cm LVOT diam:     2.00 cm LV SV:         33 LV SV Index:   14 LVOT Area:     3.14 cm  RIGHT VENTRICLE         IVC TAPSE (M-mode): 1.7 cm  IVC diam: 2.80 cm LEFT ATRIUM         Index  LA diam:    4.60 cm 1.97 cm/m  AORTIC VALVE LVOT Vmax:   75.90 cm/s LVOT Vmean:  44.600 cm/s LVOT VTI:    0.104 m  AORTA Ao Root diam: 3.50 cm MITRAL VALVE MV Area (PHT): 2.60 cm    SHUNTS MV Decel Time: 292 msec    Systemic VTI:  0.10 m MV E velocity: 49.30 cm/s  Systemic Diam: 2.00 cm MV A velocity: 82.80 cm/s MV E/A ratio:  0.60 Dalton McleanMD Electronically signed by Wilfred Lacy Signature Date/Time: 06/22/2023/11:16:14 AM    Final    DG Chest 2 View Result Date: 06/21/2023 CLINICAL DATA:  Leg swelling EXAM: CHEST - 2 VIEW COMPARISON:  Chest x-ray 05/12/2023 FINDINGS: There is a small left pleural effusion. There some patchy opacities in the right costophrenic angle and left lung base. The heart is enlarged. There central pulmonary vascular congestion. No pneumothorax or acute fracture. IMPRESSION: 1. Small left pleural effusion. 2. Patchy opacities in the right costophrenic angle and  left lung base may represent atelectasis or infection. 3. Cardiomegaly with central pulmonary vascular congestion. Electronically Signed   By: Darliss Cheney M.D.   On: 06/21/2023 18:13     LOS: 1 day   Total time spent in review of labs and imaging, patient evaluation, formulation of plan, documentation and communication with family: 35 minutes  Lanae Boast, MD  [facility]  06/23/2023, 8:52 AM

## 2023-06-24 DIAGNOSIS — R531 Weakness: Secondary | ICD-10-CM | POA: Diagnosis not present

## 2023-06-24 DIAGNOSIS — I509 Heart failure, unspecified: Secondary | ICD-10-CM

## 2023-06-24 DIAGNOSIS — I5033 Acute on chronic diastolic (congestive) heart failure: Secondary | ICD-10-CM | POA: Diagnosis not present

## 2023-06-24 DIAGNOSIS — R600 Localized edema: Secondary | ICD-10-CM | POA: Diagnosis not present

## 2023-06-24 LAB — CBC
HCT: 37.9 % — ABNORMAL LOW (ref 39.0–52.0)
Hemoglobin: 12 g/dL — ABNORMAL LOW (ref 13.0–17.0)
MCH: 28.2 pg (ref 26.0–34.0)
MCHC: 31.7 g/dL (ref 30.0–36.0)
MCV: 89 fL (ref 80.0–100.0)
Platelets: 123 10*3/uL — ABNORMAL LOW (ref 150–400)
RBC: 4.26 MIL/uL (ref 4.22–5.81)
RDW: 16.5 % — ABNORMAL HIGH (ref 11.5–15.5)
WBC: 6.1 10*3/uL (ref 4.0–10.5)
nRBC: 0 % (ref 0.0–0.2)

## 2023-06-24 LAB — BASIC METABOLIC PANEL
Anion gap: 12 (ref 5–15)
BUN: 27 mg/dL — ABNORMAL HIGH (ref 8–23)
CO2: 22 mmol/L (ref 22–32)
Calcium: 7.7 mg/dL — ABNORMAL LOW (ref 8.9–10.3)
Chloride: 105 mmol/L (ref 98–111)
Creatinine, Ser: 1.49 mg/dL — ABNORMAL HIGH (ref 0.61–1.24)
GFR, Estimated: 46 mL/min — ABNORMAL LOW (ref >60)
Glucose, Bld: 92 mg/dL (ref 70–99)
Potassium: 4.6 mmol/L (ref 3.5–5.1)
Sodium: 139 mmol/L (ref 135–145)

## 2023-06-24 MED ORDER — FUROSEMIDE 10 MG/ML IJ SOLN
60.0000 mg | Freq: Two times a day (BID) | INTRAMUSCULAR | Status: DC
  Administered 2023-06-24: 60 mg via INTRAVENOUS
  Filled 2023-06-24: qty 6

## 2023-06-24 MED ORDER — ALPRAZOLAM 0.5 MG PO TABS
1.0000 mg | ORAL_TABLET | Freq: Two times a day (BID) | ORAL | Status: DC | PRN
  Administered 2023-06-24 – 2023-07-07 (×14): 1 mg via ORAL
  Filled 2023-06-24 (×14): qty 2

## 2023-06-24 MED ORDER — LOPERAMIDE HCL 2 MG PO CAPS
2.0000 mg | ORAL_CAPSULE | Freq: Once | ORAL | Status: AC
  Administered 2023-06-24: 2 mg via ORAL
  Filled 2023-06-24: qty 1

## 2023-06-24 MED ORDER — GUAIFENESIN ER 600 MG PO TB12
600.0000 mg | ORAL_TABLET | Freq: Two times a day (BID) | ORAL | Status: DC | PRN
  Administered 2023-06-24: 600 mg via ORAL
  Filled 2023-06-24: qty 1

## 2023-06-24 NOTE — Progress Notes (Signed)
 PROGRESS NOTE Chad Hogan  ZOX:[id number] DOB: [birth year] DOA: 06/21/2023 PCP: Casper Harrison, [name], MD    Brief Narrative/Hospital Course: 83 yom w/ HTN HLD, CAD, PE, CKD stage IIIb who presented to ED w/ complaints of dizziness tiredness and lightheadedness  and difficulty ambulating and shortness of breath and progressively worsening swelling in his legs and abdomen over the last week , treated at his nursing home for recurrent pneumonia with multiple rounds of antibiotics without improvement.  chest x-ray with left-sided pleural effusion and opacities as well as pulmonary venous congestion and patient not admitted for further workup, started on IV antibiotics and lasix  Consultation: Cardiology    Subjective: Wears 3L at baseline   Assessment and Plan: Principal Problem:   Heart failure (HCC) Active Problems:   Morbid obesity (HCC)   Essential hypertension   Acute on chronic diastolic CHF with preserved EF  Acute on chronic hypoxic respiratory failure presentation likely multifactorial with acute on chronic CHF, pneumonia.   Baseline  on 3 L [place]- wean O2 as able -Congested chest x-ray, has history of liver cirrhosis.BNP somewhat elevated.TTE-shows EF 55% with G1 DD has RV pressure/volume overload with D-shaped septum Cardiology input appreciated- lasix per cards Intake output charting inaccurate - strict I/o in place    Bacterial pneumonia: recently treated with multiple rounds of antibiotics at nursing facility.  He had no leukocytosis and procalcitonin less than 0.17 admitted with vancomycin/cefepime>  de-escalate antibiotics to cover CAP, short course    RCC history: -outpatient follow up  Cryptogenic liver cirrhosis: Monitor volume status continue Lasix as above  CKD stage IIIb: -daily labs  Hypokalemia: -replete  Hypertension History of SVT/WPW syndrome HLD: - verapamil, diuretics.   -Continue Crestor  Chronic anemia Thrombocytopenia : This finding is  likely from liver cirrhosis.  Monitor   COPD: Not in exacerbation. Cont home Dulera  Anxiety: On as needed Xanax continue  Class I Obesity: Estimated body mass index is 34.9 kg/m as calculated from the following:   Height as of this encounter: 5\' 11"  (1.803 m).   Weight as of this encounter: 113.5 kg.    DVT prophylaxis: enoxaparin (LOVENOX) injection 40 mg Start: 06/22/23 1000 SCDs Start: 06/22/23 0013 Code Status:   Code Status: Full Code Family Communication: plan of care discussed with patient/none at bedside. Patient status is: Remains hospitalized because of severity of illness Level of care: Telemetry Cardiac    Objective: Vitals last 24 hrs: Vitals:   06/23/23 1933 06/24/23 0026 06/24/23 0526 06/24/23 0715  BP: 122/64 (!) 118/59 (!) 117/58 (!) 162/138  Pulse: 72 69 66 65  Resp: (!) 24 18 20 18   Temp: 98.5 F (36.9 C) 98.3 F (36.8 C) 98.3 F (36.8 C) 98.2 F (36.8 C)  TempSrc: Oral Oral Oral Oral  SpO2: 96% 94% 94% 91%  Weight:   113.5 kg   Height:       Weight change: -1.21 kg  Physical Examination:  General: Appearance:    Obese male in no acute distress     Lungs:     On , diminished, respirations unlabored  Heart:    Normal heart rate.   MS:   All extremities are intact.   Neurologic:   Awake, alert     Medications reviewed:  Scheduled Meds:  azithromycin  500 mg Oral Daily   enoxaparin (LOVENOX) injection  40 mg Subcutaneous Q24H   furosemide  40 mg Intravenous Q12H   levothyroxine  25 mcg Oral Daily   mometasone-formoterol  2 puff Inhalation BID   pantoprazole  40 mg Oral q morning   verapamil  240 mg Oral BID   Continuous Infusions:  cefTRIAXone (ROCEPHIN)  IV 200 mL/hr at 06/23/23 1828    Diet Order             Diet Heart Room service appropriate? Yes; Fluid consistency: Thin; Fluid restriction: 1500 mL Fluid  Diet effective now                  Intake/Output Summary (Last 24 hours) at 06/24/2023 0834 Last data filed at  06/24/2023 0600 Gross per 24 hour  Intake 1562.2 ml  Output 1350 ml  Net 212.2 ml   Net IO Since Admission: 212.32 mL [06/24/23 0834]  Wt Readings from Last 3 Encounters:  06/24/23 113.5 kg  10/22/22 110.3 kg  10/27/21 111.6 kg     Data Reviewed: I have personally reviewed following labs and imaging studies CBC: Recent Labs  Lab 06/21/23 1349 06/21/23 1723 06/22/23 0525 06/23/23 0233  WBC 6.9  --  5.8 5.1  HGB 12.9* 13.3 10.6* 10.6*  HCT 41.3 39.0 33.7* 33.5*  MCV 90.4  --  91.1 88.6  PLT 144*  --  106* 115*   Basic Metabolic Panel:  Recent Labs  Lab 06/21/23 1403 06/21/23 1723 06/22/23 0525 06/23/23 0233 06/24/23 0224  NA 139 136 137 141 139  K 4.3 5.7* 3.7 3.4* 4.6  CL 103  --  108 105 105  CO2 23  --  26 24 22   GLUCOSE 125*  --  82 87 92  BUN 34*  --  30* 29* 27*  CREATININE 1.64*  --  1.47* 1.50* 1.49*  CALCIUM 8.6*  --  7.1* 7.9* 7.7*   GFR: Estimated Creatinine Clearance: 48.1 mL/min (A) (by C-G formula based on SCr of 1.49 mg/dL (H)). Liver Function Tests:  Recent Labs  Lab 06/21/23 1403  AST 54*  ALT 30  ALKPHOS 182*  BILITOT 0.7  PROT 6.5  ALBUMIN 2.9*   No results for input(s): "LIPASE", "AMYLASE" in the last 168 hours.  Recent Labs  Lab 06/22/23 1737  AMMONIA 19   Recent Labs    06/23/23 0233  CHOL 93  HDL 26*  LDLCALC 53  TRIG 68  CHOLHDL 3.6   No results for input(s): "TSH", "T4TOTAL", "FREET4", "T3FREE", "THYROIDAB" in the last 72 hours. Sepsis Labs: Recent Labs  Lab 06/22/23 0525  PROCALCITON 0.17   Recent Results (from the past 240 hours)  Resp panel by RT-PCR (RSV, Flu A&B, Covid) Anterior Nasal Swab     Status: None   Collection Time: 06/21/23  5:17 PM   Specimen: Anterior Nasal Swab  Result Value Ref Range Status   SARS Coronavirus 2 by RT PCR NEGATIVE NEGATIVE Final    Comment: (NOTE) SARS-CoV-2 target nucleic acids are NOT DETECTED.  The SARS-CoV-2 RNA is generally detectable in upper respiratory specimens  during the acute phase of infection. The lowest concentration of SARS-CoV-2 viral copies this assay can detect is 138 copies/mL. A negative result does not preclude SARS-Cov-2 infection and should not be used as the sole basis for treatment or other patient management decisions. A negative result may occur with  improper specimen collection/handling, submission of specimen other than nasopharyngeal swab, presence of viral mutation(s) within the areas targeted by this assay, and inadequate number of viral copies(<138 copies/mL). A negative result must be combined with clinical observations, patient history, and epidemiological information. The expected result is Negative.  Fact Sheet for Patients:  BloggerCourse.com  Fact Sheet for Healthcare Providers:  SeriousBroker.it  This test is no t yet approved or cleared by the [place] FDA and  has been authorized for detection and/or diagnosis of SARS-CoV-2 by FDA under an Emergency Use Authorization (EUA). This EUA will remain  in effect (meaning this test can be used) for the duration of the COVID-19 declaration under Section 564(b)(1) of the Act, 21 U.S.C.section 360bbb-3(b)(1), unless the authorization is terminated  or revoked sooner.       Influenza A by PCR NEGATIVE NEGATIVE Final   Influenza B by PCR NEGATIVE NEGATIVE Final    Comment: (NOTE) The Xpert Xpress SARS-CoV-2/FLU/RSV plus assay is intended as an aid in the diagnosis of influenza from Nasopharyngeal swab specimens and should not be used as a sole basis for treatment. Nasal washings and aspirates are unacceptable for Xpert Xpress SARS-CoV-2/FLU/RSV testing.  Fact Sheet for Patients: BloggerCourse.com  Fact Sheet for Healthcare Providers: SeriousBroker.it  This test is not yet approved or cleared by the [place] FDA and has been authorized for detection  and/or diagnosis of SARS-CoV-2 by FDA under an Emergency Use Authorization (EUA). This EUA will remain in effect (meaning this test can be used) for the duration of the COVID-19 declaration under Section 564(b)(1) of the Act, 21 U.S.C. section 360bbb-3(b)(1), unless the authorization is terminated or revoked.     Resp Syncytial Virus by PCR NEGATIVE NEGATIVE Final    Comment: (NOTE) Fact Sheet for Patients: BloggerCourse.com  Fact Sheet for Healthcare Providers: SeriousBroker.it  This test is not yet approved or cleared by the [place] FDA and has been authorized for detection and/or diagnosis of SARS-CoV-2 by FDA under an Emergency Use Authorization (EUA). This EUA will remain in effect (meaning this test can be used) for the duration of the COVID-19 declaration under Section 564(b)(1) of the Act, 21 U.S.C. section 360bbb-3(b)(1), unless the authorization is terminated or revoked.  Performed at [facility], [address] [place], [place] [address]   MRSA Next Gen by PCR, Nasal     Status: None   Collection Time: 06/22/23  7:56 AM   Specimen: Nasal Mucosa; Nasal Swab  Result Value Ref Range Status   MRSA by PCR Next Gen NOT DETECTED NOT DETECTED Final    Comment: (NOTE) The GeneXpert MRSA Assay (FDA approved for NASAL specimens only), is one component of a comprehensive MRSA colonization surveillance program. It is not intended to diagnose MRSA infection nor to guide or monitor treatment for MRSA infections. Test performance is not FDA approved in patients less than 37 years old. Performed at [facility], [address] [place], [place] [address]         Radiology Studies: DG Chest Port 1 View Result Date: 06/22/2023 CLINICAL DATA:  Shortness of breath EXAM: PORTABLE CHEST 1 VIEW COMPARISON:  06/21/2023 FINDINGS: The heart size and mediastinal contours are stable. Aortic atherosclerosis.  Increasing patchy airspace opacities, most pronounced within the bibasilar regions, right worse than left. No pleural effusion or pneumothorax. IMPRESSION: Increasing patchy airspace opacities, most pronounced within the bibasilar regions. Appearance concerning for multifocal infection. Electronically Signed   By: Duanne Guess D.O.   On: 06/22/2023 13:30   ECHOCARDIOGRAM COMPLETE Result Date: 06/22/2023    ECHOCARDIOGRAM REPORT   Patient Name:   TERRAN KLINKE Date of Exam: 06/22/2023 Medical Rec #:  [id number]    Height:       71.0 in Accession #:  [id number]   Weight:       255.0 lb Date of Birth:  Jul 31, 1940    BSA:          2.338 m Patient Age:    32 years     BP:           143/66 mmHg Patient Gender: M            HR:           107 bpm. Exam Location:  Inpatient Procedure: 2D Echo, Cardiac Doppler and Color Doppler (Both Spectral and Color            Flow Doppler were utilized during procedure). Indications:    CHF I50.9  History:        Patient has no prior history of Echocardiogram examinations.                 CHF, COPD; Risk Factors:Dyslipidemia and Hypertension.  Sonographer:    Harriette Bouillon RDCS Referring Phys: [id number] ROBERT DORRELL IMPRESSIONS  1. Left ventricular ejection fraction, by estimation, is 55%. The left ventricle has normal function. Left ventricular endocardial border not optimally defined to evaluate regional wall motion. Left ventricular diastolic parameters are consistent with Grade I diastolic dysfunction (impaired relaxation).  2. D-shaped septum suggests a degree of RV pressure/volume overload. Right ventricular systolic function is normal. The right ventricular size is moderately enlarged. Tricuspid regurgitation signal is inadequate for assessing PA pressure.  3. The mitral valve was not well visualized. No evidence of mitral valve regurgitation. No evidence of mitral stenosis.  4. The aortic valve was not well visualized. Aortic valve regurgitation is not visualized. No aortic  stenosis is present.  5. IVC not well-visualized.  6. Technically very difficult study. No structure was well-visualized. I get the impression that the LV function is normal but there is a degree of RV dysfunction. FINDINGS  Left Ventricle: Left ventricular ejection fraction, by estimation, is 55%. The left ventricle has normal function. Left ventricular endocardial border not optimally defined to evaluate regional wall motion. The left ventricular internal cavity size was normal in size. There is no left ventricular hypertrophy. Left ventricular diastolic parameters are consistent with Grade I diastolic dysfunction (impaired relaxation). Right Ventricle: D-shaped septum suggests a degree of RV pressure/volume overload. The right ventricular size is moderately enlarged. Right vetricular wall thickness was not well visualized. Right ventricular systolic function is normal. Tricuspid regurgitation signal is inadequate for assessing PA pressure. Left Atrium: Left atrial size was normal in size. Right Atrium: Right atrial size was not well visualized. Pericardium: There is no evidence of pericardial effusion. Mitral Valve: The mitral valve was not well visualized. Mild mitral annular calcification. No evidence of mitral valve regurgitation. No evidence of mitral valve stenosis. Tricuspid Valve: The tricuspid valve is not well visualized. Tricuspid valve regurgitation is not demonstrated. Aortic Valve: The aortic valve was not well visualized. Aortic valve regurgitation is not visualized. No aortic stenosis is present. Pulmonic Valve: The pulmonic valve was not well visualized. Pulmonic valve regurgitation is not visualized. Aorta: The aortic root is normal in size and structure. Venous: IVC not well-visualized. The inferior vena cava was not well visualized. IAS/Shunts: The interatrial septum was not well visualized.  LEFT VENTRICLE PLAX 2D LVIDd:         4.60 cm LVIDs:         3.50 cm LV PW:         1.10 cm LV IVS:  1.00 cm LVOT diam:     2.00 cm LV SV:         33 LV SV Index:   14 LVOT Area:     3.14 cm  RIGHT VENTRICLE         IVC TAPSE (M-mode): 1.7 cm  IVC diam: 2.80 cm LEFT ATRIUM         Index LA diam:    4.60 cm 1.97 cm/m  AORTIC VALVE LVOT Vmax:   75.90 cm/s LVOT Vmean:  44.600 cm/s LVOT VTI:    0.104 m  AORTA Ao Root diam: 3.50 cm MITRAL VALVE MV Area (PHT): 2.60 cm    SHUNTS MV Decel Time: 292 msec    Systemic VTI:  0.10 m MV E velocity: 49.30 cm/s  Systemic Diam: 2.00 cm MV A velocity: 82.80 cm/s MV E/A ratio:  0.60 Dalton McleanMD Electronically signed by Wilfred Lacy Signature Date/Time: 06/22/2023/11:16:14 AM    Final      LOS: 2 days   Total time spent in review of labs and imaging, patient evaluation, formulation of plan, documentation and communication with family: 35 minutes  Joseph Art, DO  [facility]  06/24/2023, 8:34 AM

## 2023-06-24 NOTE — Plan of Care (Signed)
  Problem: Nutrition: Goal: Adequate nutrition will be maintained Outcome: Not Progressing   Problem: Coping: Goal: Level of anxiety will decrease Outcome: Not Progressing   Problem: Elimination: Goal: Will not experience complications related to bowel motility Outcome: Not Progressing

## 2023-06-24 NOTE — Consult Note (Signed)
 WOC Nurse Consult Note: Reason for Consult: BLE blisters  Wound type: full thickness  B lower extremities likely r/t  fluid overload/venous insufficiency  Pressure Injury POA: NA  Measurement: see nursing flowsheet  Wound LKG:[id number] areas of full thickness ulcerations covered in necrotic brown tissue and small serum filled bullae  Drainage (amount, consistency, odor) see nursing flowsheet  Periwound: erythema and edema, hyperkeratotic skin  Dressing procedure/placement/frequency: Cleanse B lower legs (intact skin, blisters and ulcerations) with Vashe wound cleanser [employer] [place] [phone]), do not rinse and allow to air dry. Apply Xeroform gauze [place] [phone]) to anterior and posterior legs daily, cover with ABD pads and secure with Kerlix roll gauze wrapped beginning just above toes and ending right below knees. May apply Ace bandage wrapped in same fashion as Kerlix for light compression.  Patient should elevate legs as much as possible.   POC discussed with bedside nurse. WOC team will not follow. Re-consult if further needs arise.   Thank you,    Priscella Mann MSN, RN-BC, [employer] [phone]

## 2023-06-24 NOTE — Progress Notes (Signed)
 Progress Note  Patient Name: Chad Hogan Date of Encounter: 06/24/2023  Primary Cardiologist: Sherryl Manges, MD   Subjective   Patient seen examined his bedside.  Offers no complaints at this time.    Inpatient Medications    Scheduled Meds:  azithromycin  500 mg Oral Daily   enoxaparin (LOVENOX) injection  40 mg Subcutaneous Q24H   furosemide  40 mg Intravenous Q12H   levothyroxine  25 mcg Oral Daily   mometasone-formoterol  2 puff Inhalation BID   pantoprazole  40 mg Oral q morning   verapamil  240 mg Oral BID   Continuous Infusions:  cefTRIAXone (ROCEPHIN)  IV 200 mL/hr at 06/23/23 1828   PRN Meds: acetaminophen **OR** acetaminophen, ALPRAZolam, guaiFENesin, ipratropium-albuterol, ondansetron **OR** ondansetron (ZOFRAN) IV, oxyCODONE   Vital Signs    Vitals:   06/23/23 1933 06/24/23 0026 06/24/23 0526 06/24/23 0715  BP: 122/64 (!) 118/59 (!) 117/58 (!) 162/138  Pulse: 72 69 66 65  Resp: (!) 24 18 20 18   Temp: 98.5 F (36.9 C) 98.3 F (36.8 C) 98.3 F (36.8 C) 98.2 F (36.8 C)  TempSrc: Oral Oral Oral Oral  SpO2: 96% 94% 94% 91%  Weight:   113.5 kg   Height:        Intake/Output Summary (Last 24 hours) at 06/24/2023 1009 Last data filed at 06/24/2023 0933 Gross per 24 hour  Intake 1602.2 ml  Output 1450 ml  Net 152.2 ml   Filed Weights   06/22/23 1707 06/23/23 0512 06/24/23 0526  Weight: 114.7 kg 113.4 kg 113.5 kg    Telemetry    Sinus rhythm- Personally Reviewed  ECG     - Personally Reviewed  Physical Exam    General: Comfortable, sitting up in a chair Head: Atraumatic, normal size  Eyes: PEERLA, EOMI  Neck: Supple, normal JVD Cardiac: Normal S1, S2; RRR; no murmurs, rubs, or gallops Lungs: Clear to auscultation bilaterally Abd: Soft, nontender, no hepatomegaly  Ext: warm, +3 bilateral peripheral edema/bilateral redness however no tenderness. Musculoskeletal: No deformities, BUE and BLE strength normal and equal Skin: Warm and dry, no  rashes   Neuro: Alert and oriented to person, place, time, and situation, CNII-XII grossly intact, no focal deficits  Psych: Normal mood and affect   Labs    Chemistry Recent Labs  Lab 06/21/23 1403 06/21/23 1723 06/22/23 0525 06/23/23 0233 06/24/23 0224  NA 139   < > 137 141 139  K 4.3   < > 3.7 3.4* 4.6  CL 103  --  108 105 105  CO2 23  --  26 24 22   GLUCOSE 125*  --  82 87 92  BUN 34*  --  30* 29* 27*  CREATININE 1.64*  --  1.47* 1.50* 1.49*  CALCIUM 8.6*  --  7.1* 7.9* 7.7*  PROT 6.5  --   --   --   --   ALBUMIN 2.9*  --   --   --   --   AST 54*  --   --   --   --   ALT 30  --   --   --   --   ALKPHOS 182*  --   --   --   --   BILITOT 0.7  --   --   --   --   GFRNONAA 41*  --  47* 46* 46*  ANIONGAP 13  --  3* 12 12   < > = values in this interval  not displayed.     Hematology Recent Labs  Lab 06/21/23 1349 06/21/23 1723 06/22/23 0525 06/23/23 0233  WBC 6.9  --  5.8 5.1  RBC 4.57  --  3.70* 3.78*  HGB 12.9* 13.3 10.6* 10.6*  HCT 41.3 39.0 33.7* 33.5*  MCV 90.4  --  91.1 88.6  MCH 28.2  --  28.6 28.0  MCHC 31.2  --  31.5 31.6  RDW 15.9*  --  16.0* 16.1*  PLT 144*  --  106* 115*    Cardiac EnzymesNo results for input(s): "TROPONINI" in the last 168 hours. No results for input(s): "TROPIPOC" in the last 168 hours.   BNP Recent Labs  Lab 06/21/23 1350  BNP 291.1*     DDimer No results for input(s): "DDIMER" in the last 168 hours.   Radiology    DG Chest Port 1 View Result Date: 06/22/2023 CLINICAL DATA:  Shortness of breath EXAM: PORTABLE CHEST 1 VIEW COMPARISON:  06/21/2023 FINDINGS: The heart size and mediastinal contours are stable. Aortic atherosclerosis. Increasing patchy airspace opacities, most pronounced within the bibasilar regions, right worse than left. No pleural effusion or pneumothorax. IMPRESSION: Increasing patchy airspace opacities, most pronounced within the bibasilar regions. Appearance concerning for multifocal infection.  Electronically Signed   By: Duanne Guess D.O.   On: 06/22/2023 13:30   ECHOCARDIOGRAM COMPLETE Result Date: 06/22/2023    ECHOCARDIOGRAM REPORT   Patient Name:   Chad Hogan Date of Exam: 06/22/2023 Medical Rec #:  [id number]    Height:       71.0 in Accession #:    [id number]   Weight:       255.0 lb Date of Birth:  1941/03/28    BSA:          2.338 m Patient Age:    83 years     BP:           143/66 mmHg Patient Gender: M            HR:           107 bpm. Exam Location:  Inpatient Procedure: 2D Echo, Cardiac Doppler and Color Doppler (Both Spectral and Color            Flow Doppler were utilized during procedure). Indications:    CHF I50.9  History:        Patient has no prior history of Echocardiogram examinations.                 CHF, COPD; Risk Factors:Dyslipidemia and Hypertension.  Sonographer:    Harriette Bouillon RDCS Referring Phys: [id number] ROBERT DORRELL IMPRESSIONS  1. Left ventricular ejection fraction, by estimation, is 55%. The left ventricle has normal function. Left ventricular endocardial border not optimally defined to evaluate regional wall motion. Left ventricular diastolic parameters are consistent with Grade I diastolic dysfunction (impaired relaxation).  2. D-shaped septum suggests a degree of RV pressure/volume overload. Right ventricular systolic function is normal. The right ventricular size is moderately enlarged. Tricuspid regurgitation signal is inadequate for assessing PA pressure.  3. The mitral valve was not well visualized. No evidence of mitral valve regurgitation. No evidence of mitral stenosis.  4. The aortic valve was not well visualized. Aortic valve regurgitation is not visualized. No aortic stenosis is present.  5. IVC not well-visualized.  6. Technically very difficult study. No structure was well-visualized. I get the impression that the LV function is normal but there is a degree of RV dysfunction. FINDINGS  Left Ventricle:  Left ventricular ejection fraction, by  estimation, is 55%. The left ventricle has normal function. Left ventricular endocardial border not optimally defined to evaluate regional wall motion. The left ventricular internal cavity size was normal in size. There is no left ventricular hypertrophy. Left ventricular diastolic parameters are consistent with Grade I diastolic dysfunction (impaired relaxation). Right Ventricle: D-shaped septum suggests a degree of RV pressure/volume overload. The right ventricular size is moderately enlarged. Right vetricular wall thickness was not well visualized. Right ventricular systolic function is normal. Tricuspid regurgitation signal is inadequate for assessing PA pressure. Left Atrium: Left atrial size was normal in size. Right Atrium: Right atrial size was not well visualized. Pericardium: There is no evidence of pericardial effusion. Mitral Valve: The mitral valve was not well visualized. Mild mitral annular calcification. No evidence of mitral valve regurgitation. No evidence of mitral valve stenosis. Tricuspid Valve: The tricuspid valve is not well visualized. Tricuspid valve regurgitation is not demonstrated. Aortic Valve: The aortic valve was not well visualized. Aortic valve regurgitation is not visualized. No aortic stenosis is present. Pulmonic Valve: The pulmonic valve was not well visualized. Pulmonic valve regurgitation is not visualized. Aorta: The aortic root is normal in size and structure. Venous: IVC not well-visualized. The inferior vena cava was not well visualized. IAS/Shunts: The interatrial septum was not well visualized.  LEFT VENTRICLE PLAX 2D LVIDd:         4.60 cm LVIDs:         3.50 cm LV PW:         1.10 cm LV IVS:        1.00 cm LVOT diam:     2.00 cm LV SV:         33 LV SV Index:   14 LVOT Area:     3.14 cm  RIGHT VENTRICLE         IVC TAPSE (M-mode): 1.7 cm  IVC diam: 2.80 cm LEFT ATRIUM         Index LA diam:    4.60 cm 1.97 cm/m  AORTIC VALVE LVOT Vmax:   75.90 cm/s LVOT Vmean:  44.600  cm/s LVOT VTI:    0.104 m  AORTA Ao Root diam: 3.50 cm MITRAL VALVE MV Area (PHT): 2.60 cm    SHUNTS MV Decel Time: 292 msec    Systemic VTI:  0.10 m MV E velocity: 49.30 cm/s  Systemic Diam: 2.00 cm MV A velocity: 82.80 cm/s MV E/A ratio:  0.60 Dalton McleanMD Electronically signed by Wilfred Lacy Signature Date/Time: 06/22/2023/11:16:14 AM    Final     Cardiac Studies   Reviewed recent echocardiogram  Patient Profile     83 y.o. male history of HTN, HLD, CAD, history of PE, CKD stage 3b, SVT, WPW, COPD, HFpEF, OSA on CPAP, RCC, liver cirrhosis, recent SAH s/p fall. He presented to the [facility] ED on 06/21/2023 complaining of leg swelling, weakness, dizziness, increased shortness of breath.   Assessment & Plan    Acute on chronic heart failure with preserved ejection fraction Hypertension History of VT with loss of exacerbation, status post complete ablation of left lateral accessory pathway CKD stage 3b Bacterial PNA - managed by the primary team   Clinically he is still fluid overloaded, I would like to adjust his Lasix to 60 mg twice daily.  Kidney function is holding steady slightly increased today but I will monitor this closely on increased dose of Lasix.   Creatinine stable will continue to monitor this is  why he is on diuretics.  Blood pressure is elevated, will wait and see on the higher dose of Lasix if needs to be adjusted we will go ahead and do that.   In sinus rhythm, please continue with her current verapamil dose.    We will continue to follow with you.      For questions or updates, please contact [facility] Please consult [email or web address] for contact info under Cardiology/STEMI.      Signed, Kaliope Quinonez, DO  06/24/2023, 10:09 AM

## 2023-06-25 DIAGNOSIS — I5033 Acute on chronic diastolic (congestive) heart failure: Secondary | ICD-10-CM | POA: Diagnosis not present

## 2023-06-25 LAB — CBC
HCT: 34.6 % — ABNORMAL LOW (ref 39.0–52.0)
Hemoglobin: 11 g/dL — ABNORMAL LOW (ref 13.0–17.0)
MCH: 28.4 pg (ref 26.0–34.0)
MCHC: 31.8 g/dL (ref 30.0–36.0)
MCV: 89.2 fL (ref 80.0–100.0)
Platelets: 106 10*3/uL — ABNORMAL LOW (ref 150–400)
RBC: 3.88 MIL/uL — ABNORMAL LOW (ref 4.22–5.81)
RDW: 16.4 % — ABNORMAL HIGH (ref 11.5–15.5)
WBC: 5.7 10*3/uL (ref 4.0–10.5)
nRBC: 0 % (ref 0.0–0.2)

## 2023-06-25 LAB — BASIC METABOLIC PANEL
Anion gap: 8 (ref 5–15)
BUN: 24 mg/dL — ABNORMAL HIGH (ref 8–23)
CO2: 28 mmol/L (ref 22–32)
Calcium: 7.7 mg/dL — ABNORMAL LOW (ref 8.9–10.3)
Chloride: 102 mmol/L (ref 98–111)
Creatinine, Ser: 1.6 mg/dL — ABNORMAL HIGH (ref 0.61–1.24)
GFR, Estimated: 42 mL/min — ABNORMAL LOW (ref >60)
Glucose, Bld: 91 mg/dL (ref 70–99)
Potassium: 3.5 mmol/L (ref 3.5–5.1)
Sodium: 138 mmol/L (ref 135–145)

## 2023-06-25 MED ORDER — FUROSEMIDE 10 MG/ML IJ SOLN
120.0000 mg | Freq: Two times a day (BID) | INTRAVENOUS | Status: DC
  Administered 2023-06-25 – 2023-06-26 (×2): 120 mg via INTRAVENOUS
  Filled 2023-06-25: qty 120
  Filled 2023-06-25: qty 10
  Filled 2023-06-25: qty 12

## 2023-06-25 NOTE — Evaluation (Addendum)
 Physical Therapy Evaluation Patient Details Name: Chad Hogan MRN: [id number] DOB: Jul 17, 1940 Today's Date: 06/25/2023  History of Present Illness  83 y.o. male admitted 3/10 with fatigue, LB edema, SOB with acute on chronic HFpEF. PMhx: HTN, HLD, CAD, PE, CKD, COPD (3L O2 at baseline), OSA, Wolff-Parkinson-white syndrome, liver cirrhosis  Clinical Impression  Pt admitted with above diagnosis. PTA, pt was independent-modI with functional mobility intermittently using a RW, independent with ADLs and IADLs. He resides alone in a one story house with 4 STE and a LHR. Pt currently with functional limitations due to the deficits listed below (see PT Problem List). Examination limited by orthostatic hypotension and desaturation with mobility. He currently requires CGA for all functional mobility. Pt displayed generalized weakness and reduced activity tolerance. Pt will benefit from acute skilled PT to increase his upright tolerance, titrate supplemental oxygen requirements, and maximize independence and safety with mobility to allow discharge. Given the strong family support available and anticipated pt progress recommend Home with HHPT.     If plan is discharge home, recommend the following: A little help with walking and/or transfers;Help with stairs or ramp for entrance;Assistance with cooking/housework;A little help with bathing/dressing/bathroom;Assist for transportation   Can travel by private vehicle        Equipment Recommendations None recommended by PT (Pt already has DME)  Recommendations for Other Services       Functional Status Assessment Patient has had a recent decline in their functional status and demonstrates the ability to make significant improvements in function in a reasonable and predictable amount of time.     Precautions / Restrictions Precautions Precautions: Fall Recall of Precautions/Restrictions: Intact Restrictions Weight Bearing Restrictions Per Provider Order:  No      Mobility  Bed Mobility Overal bed mobility: Needs Assistance Bed Mobility: Supine to Sit     Supine to sit: HOB elevated, Used rails, Contact guard     General bed mobility comments: Pt took increased time to reach EOB and required CGA at trunk to reach upright and scoot fwd.    Transfers Overall transfer level: Needs assistance Equipment used: Rolling walker (2 wheels) Transfers: Sit to/from Stand, Bed to chair/wheelchair/BSC Sit to Stand: Contact guard assist   Step pivot transfers: Contact guard assist       General transfer comment: Pt stood from lowest bed height. Cued pt on proper hand positioning as he initially had BUE on RW. Pt powered up with CGA. He was able to transfer to the R with short steps to recliner chair using RW with CGA at trunk. Good eccentric control with sitting.    Ambulation/Gait               General Gait Details: Deferred d/t orthostatic hypotension  Stairs            Wheelchair Mobility     Tilt Bed    Modified Rankin (Stroke Patients Only)       Balance Overall balance assessment: Needs assistance Sitting-balance support: Bilateral upper extremity supported, Feet supported Sitting balance-Leahy Scale: Fair Sitting balance - Comments: Pt sat EOB with BUE support. He c/o lightheadedness so maintain CGA for safety.   Standing balance support: Bilateral upper extremity supported, During functional activity, Reliant on assistive device for balance Standing balance-Leahy Scale: Poor Standing balance comment: Pt dependent on RW for stability when OOB and CGA for safety.  Pertinent Vitals/Pain Pain Assessment Pain Assessment: No/denies pain    Home Living Family/patient expects to be discharged to:: Private residence Living Arrangements: Alone Available Help at Discharge: Family;Available 24 hours/day;Available PRN/intermittently (Daughter is retired and could temporarily  stay with him and help out as needed. Son is also local and could assist prn) Type of Home: House Home Access: Stairs to enter Entrance Stairs-Rails: Left Entrance Stairs-Number of Steps: 4   Home Layout: One level Home Equipment: Grab bars - toilet;Other (comment);Rolling Walker (2 wheels) (railing on the side of the bed; Oxygen) Additional Comments: Pt reports he fell in January and since then his daughter has been by to check on him everyday.    Prior Function Prior Level of Function : Independent/Modified Independent;History of Falls (last six months);Driving             Mobility Comments: Pt reports a couple of weeks ago he wasn't using an AD, but has lately been using the RW to move around. Reports 1 fall in the last 57mo on the stairs. ADLs Comments: Indep, retires, drives. Manages his own medications. States he takes care of household chores but isn't too good at cooking/cleaning.     Extremity/Trunk Assessment   Upper Extremity Assessment Upper Extremity Assessment: Generalized weakness    Lower Extremity Assessment Lower Extremity Assessment: Generalized weakness    Cervical / Trunk Assessment Cervical / Trunk Assessment: Normal  Communication   Communication Communication: Impaired Factors Affecting Communication: Hearing impaired    Cognition Arousal: Alert Behavior During Therapy: WFL for tasks assessed/performed   PT - Cognitive impairments: No apparent impairments                       PT - Cognition Comments: Pt A,Ox4. Following commands: Intact       Cueing Cueing Techniques: Verbal cues, Tactile cues     General Comments General comments (skin integrity, edema, etc.): Pt greeted on 3L O2 via [place], which is pt's baseline. During session he desaturated to 81% with activity required bump up to 6L to maintain 88% with cues for PLB. Once seated in recliner chair able to titrate back to 3L with SpO2 90%. BP: seated EOB 133/77 (92), standing  126/64, seated in recliner chair at end of session 115/98 (104). Pt had symptomatic orthostatic hypotension in standing and was unable to advance gait d/t dizziness. Pt reported he has baseline shakiness, which is improved when he recieves Xanax that he hasn't been given yet today.    Exercises     Assessment/Plan    PT Assessment Patient needs continued PT services  PT Problem List Decreased strength;Decreased activity tolerance;Decreased balance;Decreased mobility;Cardiopulmonary status limiting activity       PT Treatment Interventions DME instruction;Gait training;Stair training;Functional mobility training;Therapeutic activities;Therapeutic exercise;Balance training;Patient/family education    PT Goals (Current goals can be found in the Care Plan section)  Acute Rehab PT Goals Patient Stated Goal: Return Home PT Goal Formulation: With patient Time For Goal Achievement: 07/09/23 Potential to Achieve Goals: Good    Frequency Min 2X/week     Co-evaluation               AM-PAC PT "6 Clicks" Mobility  Outcome Measure Help needed turning from your back to your side while in a flat bed without using bedrails?: A Little Help needed moving from lying on your back to sitting on the side of a flat bed without using bedrails?: A Little Help needed moving to and from  a bed to a chair (including a wheelchair)?: A Little Help needed standing up from a chair using your arms (e.g., wheelchair or bedside chair)?: A Little Help needed to walk in hospital room?: A Lot Help needed climbing 3-5 steps with a railing? : A Lot 6 Click Score: 16    End of Session Equipment Utilized During Treatment: Gait belt;Oxygen Activity Tolerance: Patient limited by fatigue;Treatment limited secondary to medical complications (Comment) (orthostatic hypotension) Patient left: in chair;with call bell/phone within reach;with chair alarm set Nurse Communication: Mobility status;Other (comment) (Pt's  desaturation, orthostatic hypotension, and request for meds.) PT Visit Diagnosis: History of falling (Z91.81);Muscle weakness (generalized) (M62.81);Unsteadiness on feet (R26.81);Difficulty in walking, not elsewhere classified (R26.2)    Time: 0828-0900 PT Time Calculation (min) (ACUTE ONLY): 32 min   Charges:   PT Evaluation $PT Eval Moderate Complexity: 1 Mod PT Treatments $Therapeutic Activity: 8-22 mins PT General Charges $$ ACUTE PT VISIT: 1 Visit         Cheri Guppy, PT, DPT Acute Rehabilitation Services Office: [phone] Secure Chat Preferred  Richardson Chiquito 06/25/2023, 9:23 AM

## 2023-06-25 NOTE — TOC Progression Note (Signed)
 Transition of Care [facility]) - Progression Note    Patient Details  Name: Chad Hogan MRN: [id number] Date of Birth: 09-22-1940  [facility] CM/SW Contact  Michaela Corner, [place] Phone Number: 06/25/2023, 1:33 PM  Clinical Narrative:   Per Chart Review, Patient was at [facility] in January. Per patient he was at [facility] for about two weeks and stated he would not want to go back if PT rec SNF again.   PT has rec'd HH at this time.   TOC will continue to follow.   Expected Discharge Plan: Home w Home Health Services Barriers to Discharge: Continued Medical Work up  Expected Discharge Plan and Services In-house Referral: Clinical Social Work Discharge Planning Services: CM Consult Post Acute Care Choice: Home Health Living arrangements for the past 2 months: Single Family Home                 DME Arranged: N/A DME Agency: NA       HH Arranged: RN, PT, OT, Nurse's Aide HH Agency: [facility] Date [facility] Agency Contacted: 06/25/23 Time HH Agency Contacted: 1238 Representative spoke with at [facility] Agency: Dewayne Hatch   Social Determinants of Health (SDOH) Interventions SDOH Screenings   Food Insecurity: No Food Insecurity (06/22/2023)  Housing: Low Risk  (06/22/2023)  Transportation Needs: No Transportation Needs (06/22/2023)  Utilities: Not At Risk (06/22/2023)  Depression (PHQ2-9): Low Risk  (04/20/2023)  Social Connections: Socially Isolated (06/22/2023)  Tobacco Use: High Risk (06/22/2023)    Readmission Risk Interventions    06/25/2023   12:29 PM  Readmission Risk Prevention Plan  Transportation Screening Complete  PCP or Specialist Appt within 5-7 Days Complete  Home Care Screening Complete  Medication Review (RN CM) Complete

## 2023-06-25 NOTE — Progress Notes (Signed)
 Progress Note  Patient Name: Chad Hogan Date of Encounter: 06/25/2023  Primary Cardiologist: Sherryl Manges, MD   Subjective   Patient seen examined his bedside.  Offers no complaints at this time.    Inpatient Medications    Scheduled Meds:  azithromycin  500 mg Oral Daily   enoxaparin (LOVENOX) injection  40 mg Subcutaneous Q24H   furosemide  60 mg Intravenous Q12H   levothyroxine  25 mcg Oral Daily   mometasone-formoterol  2 puff Inhalation BID   pantoprazole  40 mg Oral q morning   verapamil  240 mg Oral BID   Continuous Infusions:  cefTRIAXone (ROCEPHIN)  IV 2 g (06/24/23 1806)   PRN Meds: acetaminophen **OR** acetaminophen, ALPRAZolam, guaiFENesin, ipratropium-albuterol, ondansetron **OR** ondansetron (ZOFRAN) IV, oxyCODONE   Vital Signs    Vitals:   06/24/23 2033 06/25/23 0105 06/25/23 0433 06/25/23 0709  BP: 124/71 119/60 127/61 (!) 122/59  Pulse: 67 71 71 62  Resp: 19 18 18 20   Temp: 98 F (36.7 C) 98.6 F (37 C) 97.9 F (36.6 C) 97.9 F (36.6 C)  TempSrc: Oral Oral Oral Oral  SpO2: 92% 94% 93% 95%  Weight:   115.3 kg   Height:        Intake/Output Summary (Last 24 hours) at 06/25/2023 0814 Last data filed at 06/25/2023 1610 Gross per 24 hour  Intake 220 ml  Output 900 ml  Net -680 ml   Filed Weights   06/23/23 0512 06/24/23 0526 06/25/23 0433  Weight: 113.4 kg 113.5 kg 115.3 kg    Telemetry    Sinus rhythm- Personally Reviewed  ECG     - Personally Reviewed  Physical Exam    General: Comfortable, sitting up in a chair Head: Atraumatic, normal size  Eyes: PEERLA, EOMI  Neck: Supple, normal JVD Cardiac: Normal S1, S2; RRR; no murmurs, rubs, or gallops Lungs: Clear to auscultation bilaterally Abd: Soft, nontender, no hepatomegaly  Ext: warm, +3 bilateral peripheral edema/bilateral redness however no tenderness. Musculoskeletal: No deformities, BUE and BLE strength normal and equal Skin: Warm and dry, no rashes   Neuro: Alert and  oriented to person, place, time, and situation, CNII-XII grossly intact, no focal deficits  Psych: Normal mood and affect   Labs    Chemistry Recent Labs  Lab 06/21/23 1403 06/21/23 1723 06/23/23 0233 06/24/23 0224 06/25/23 0226  NA 139   < > 141 139 138  K 4.3   < > 3.4* 4.6 3.5  CL 103   < > 105 105 102  CO2 23   < > 24 22 28   GLUCOSE 125*   < > 87 92 91  BUN 34*   < > 29* 27* 24*  CREATININE 1.64*   < > 1.50* 1.49* 1.60*  CALCIUM 8.6*   < > 7.9* 7.7* 7.7*  PROT 6.5  --   --   --   --   ALBUMIN 2.9*  --   --   --   --   AST 54*  --   --   --   --   ALT 30  --   --   --   --   ALKPHOS 182*  --   --   --   --   BILITOT 0.7  --   --   --   --   GFRNONAA 41*   < > 46* 46* 42*  ANIONGAP 13   < > 12 12 8    < > =  values in this interval not displayed.     Hematology Recent Labs  Lab 06/23/23 0233 06/24/23 1045 06/25/23 0226  WBC 5.1 6.1 5.7  RBC 3.78* 4.26 3.88*  HGB 10.6* 12.0* 11.0*  HCT 33.5* 37.9* 34.6*  MCV 88.6 89.0 89.2  MCH 28.0 28.2 28.4  MCHC 31.6 31.7 31.8  RDW 16.1* 16.5* 16.4*  PLT 115* 123* 106*    Cardiac EnzymesNo results for input(s): "TROPONINI" in the last 168 hours. No results for input(s): "TROPIPOC" in the last 168 hours.   BNP Recent Labs  Lab 06/21/23 1350  BNP 291.1*     DDimer No results for input(s): "DDIMER" in the last 168 hours.   Radiology    No results found.   Cardiac Studies   Reviewed recent echocardiogram  Patient Profile     83 y.o. male history of HTN, HLD, CAD, history of PE, CKD stage 3b, SVT, WPW, COPD, HFpEF, OSA on CPAP, RCC, liver cirrhosis, recent SAH s/p fall. He presented to the [facility] ED on 06/21/2023 complaining of leg swelling, weakness, dizziness, increased shortness of breath.   Assessment & Plan    Acute on chronic heart failure with preserved ejection fraction Hypertension History of VT with loss of exacerbation, status post complete ablation of left lateral accessory pathway CKD stage  3b Bacterial PNA - managed by the primary team   Clinically he is still appears to be volume overloaded he is improving from the physical exam standpoint though.  He is -680 mL today, I am going to increase the Lasix to 120 mg twice daily.  Creatinine is slightly increased 1.6 but this is close to his baseline.  Creatinine stable will continue to monitor this is why he is on diuretics.  Blood pressure is elevated, will wait and see on the higher dose of Lasix if needs to be adjusted we will go ahead and do that.   In sinus rhythm, please continue with her current verapamil dose.    We will continue to follow with you.      For questions or updates, please contact [facility] Please consult [email or web address] for contact info under Cardiology/STEMI.      Signed, Rheta Hemmelgarn, DO  06/25/2023, 8:14 AM

## 2023-06-25 NOTE — TOC Initial Note (Signed)
 Transition of Care [facility]) - Initial/Assessment Note    Patient Details  Name: Chad Hogan MRN: [id number] Date of Birth: April 09, 1941  Transition of Care [facility]) CM/SW Contact:    Leone Haven, RN Phone Number: 06/25/2023, 12:40 PM  Clinical Narrative:                 Per HIPAA , patient gave this NCM permission to speak with his daughter at the bedside, Patina Kozuch [phone], he is from home alone, has PCP and insurance on file,  NCM offered choice for [facility] services per daughter he is active with [facility] for [facility], HHPT, HHOT, HHAIDE and patient and daughter would like to continue with them, has  walker, rail in bathroom, rail outside going up the stairs, home oxygen 4 liters with adapt at home.  Patina states she will transport him via car at dc to home, Patina is support system, states gets medications from [facility] in [place].  Pta self ambulatory with walker.   This NCM contacted Ann with [facility] and confirmed patient is active with them for RN, PT, OT, and aide.  Dewayne Hatch says to let them know when he is ready for dc they will continue with him.  Soc will begin 24 to 48 hrs post dc.    Expected Discharge Plan: Home w Home Health Services Barriers to Discharge: Continued Medical Work up   Patient Goals and CMS Choice Patient states their goals for this hospitalization and ongoing recovery are:: get better CMS Medicare.gov Compare Post Acute Care list provided to:: Patient Represenative (must comment) (daughter) Choice offered to / list presented to : Adult Children      Expected Discharge Plan and Services In-house Referral: Clinical Social Work Discharge Planning Services: CM Consult Post Acute Care Choice: Home Health Living arrangements for the past 2 months: Single Family Home                 DME Arranged: N/A DME Agency: NA       HH Arranged: RN, PT, OT, Nurse's Aide HH [facility]: [facility]:  06/25/23 Time HH Agency Contacted: 1238 Representative spoke with at [facility] Agency: Ann  Prior Living Arrangements/Services Living arrangements for the past 2 months: Single Family Home Lives with:: Self Patient language and need for interpreter reviewed:: Yes Do you feel safe going back to the place where you live?: Yes      Need for Family Participation in Patient Care: Yes (Comment) Care giver support system in place?: Yes (comment) Current home services: DME (walker, rail in bathroom, rail outside going up the stairs, home oxygen 4 liters with adapt.) Criminal Activity/Legal Involvement Pertinent to Current Situation/Hospitalization: No - Comment as needed  Activities of Daily Living   ADL Screening (condition at time of admission) Independently performs ADLs?: No Does the patient have a NEW difficulty with bathing/dressing/toileting/self-feeding that is expected to last >3 days?: No (needs assist) Does the patient have a NEW difficulty with getting in/out of bed, walking, or climbing stairs that is expected to last >3 days?: No (needs assist) Does the patient have a NEW difficulty with communication that is expected to last >3 days?: No Is the patient deaf or have difficulty hearing?: Yes Does the patient have difficulty seeing, even when wearing glasses/contacts?: No Does the patient have difficulty concentrating, remembering, or making decisions?: No  Permission Sought/Granted Permission sought to share information with : Case Manager, Family  Supports Permission granted to share information with : Yes, Verbal Permission Granted  Share Information with NAME: Patina Pietro  Permission granted to share info w AGENCY: HH  Permission granted to share info w Relationship: daughter  Permission granted to share info w Contact Information: Patina Colebank [phone]  Emotional Assessment Appearance:: Appears stated age Attitude/Demeanor/Rapport: Engaged Affect (typically observed):  Appropriate Orientation: : Oriented to Self, Oriented to Place, Oriented to  Time, Oriented to Situation Alcohol / Substance Use: Not Applicable Psych Involvement: No (comment)  Admission diagnosis:  Peripheral edema [R60.0] Generalized weakness [R53.1] Heart failure (HCC) [I50.9] Acute on chronic congestive heart failure, unspecified heart failure type [facility]) [I50.9] Patient Active Problem List   Diagnosis Date Noted   Heart failure (HCC) 06/22/2023   Right renal mass 04/20/2023   (HFpEF) heart failure with preserved ejection fraction (HCC) 10/09/2019   COPD with acute exacerbation (HCC)    Sepsis due to pneumonia (HCC)    Chronic diastolic CHF (congestive heart failure) (HCC)    Wolff-Parkinson-White syndrome    Thoracic ascending aortic aneurysm (HCC)    Obesity hypoventilation syndrome (HCC)    OSA on CPAP    CKD (chronic kidney disease), stage III (HCC) 12/16/2016   Acute on chronic respiratory failure with hypoxia (HCC) 12/16/2016   NSVT (nonsustained ventricular tachycardia) (HCC) 12/16/2016   Hypocalcemia 12/16/2016   SVT (supraventricular tachycardia) (HCC) 03/23/2016   WPW syndrome 03/23/2016   Bradycardia 03/11/2016   Ventricular tachycardia (HCC) 03/11/2016   Benign neoplasm of colon 06/11/2014   Diverticulosis of colon without hemorrhage 06/11/2014   Internal and external hemorrhoids without complication 06/11/2014   Duodenal mass 06/11/2014   History of iron deficiency anemia 05/02/2014   Acute pulmonary embolism (HCC) 05/02/2014   PALPITATIONS 02/11/2010   ATYPICAL DEPRESSIVE DISORDER 08/21/2008   PULMONARY FIBROSIS 09/15/2007   Chronic diastolic heart failure (HCC) 09/15/2007   HYPERLIPIDEMIA 08/16/2007   Sleep apnea 08/16/2007   Morbid obesity (HCC) 08/15/2007   Anxiety state 08/15/2007   Essential hypertension 08/15/2007   ISCHEMIC HEART DISEASE 08/15/2007   WOLFF (WOLFE)-PARKINSON-WHITE (WPW) SYNDROME 08/15/2007   PREMATURE VENTRICULAR CONTRACTIONS  08/15/2007   Other specified chronic obstructive pulmonary disease (HCC) 08/15/2007   PCP:  Street, [name], MD Pharmacy:   [facility] [id number] - [place], [place] - [address] [place] [address] Phone: [phone] Fax: [phone]  [facility] - [place], [place] - [address] [place] [address] Phone: [phone] Fax: [phone]     Social Drivers of Health (SDOH) Social History: SDOH Screenings   Food Insecurity: No Food Insecurity (06/22/2023)  Housing: Low Risk  (06/22/2023)  Transportation Needs: No Transportation Needs (06/22/2023)  Utilities: Not At Risk (06/22/2023)  Depression (PHQ2-9): Low Risk  (04/20/2023)  Social Connections: Socially Isolated (06/22/2023)  Tobacco Use: High Risk (06/22/2023)   SDOH Interventions:     Readmission Risk Interventions    06/25/2023   12:29 PM  Readmission Risk Prevention Plan  Transportation Screening Complete  PCP or Specialist Appt within 5-7 Days Complete  Home Care Screening Complete  Medication Review (RN CM) Complete

## 2023-06-25 NOTE — Progress Notes (Signed)
 Mobility Specialist Progress Note:   06/25/23 1210  Mobility  Activity Transferred from chair to bed  Level of Assistance Contact guard assist, steadying assist  Assistive Device Front wheel walker  Distance Ambulated (ft) 5 ft  Activity Response Tolerated well  Mobility Referral Yes  Mobility visit 1 Mobility  Mobility Specialist Start Time (ACUTE ONLY) 1210  Mobility Specialist Stop Time (ACUTE ONLY) 1221  Mobility Specialist Time Calculation (min) (ACUTE ONLY) 11 min   Pt agreeable to mobility session, requested to transfer back to bed. Required only minG assist to stand and pivot to bed with RW. No c/o dizziness, BP 120s/80s. Pt left with all needs met, alarm on.   Addison Lank Mobility Specialist Please contact via SecureChat or  Rehab office at [phone]

## 2023-06-25 NOTE — Progress Notes (Signed)
 PROGRESS NOTE VAISHNAV DEMARTIN  ZOX:[id number] DOB: 1940/06/30 DOA: 06/21/2023 PCP: Casper Harrison, [name], MD    Brief Narrative/Hospital Course: 83 yom w/ HTN HLD, CAD, PE, CKD stage IIIb who presented to ED w/ complaints of dizziness tiredness and lightheadedness  and difficulty ambulating and shortness of breath and progressively worsening swelling in his legs and abdomen over the last week , treated at his nursing home for recurrent pneumonia with multiple rounds of antibiotics without improvement.  chest x-ray with left-sided pleural effusion and opacities as well as pulmonary venous congestion and patient not admitted for further workup, started on IV antibiotics and lasix  Consultation: Cardiology    Subjective: Seen and examined this morning Today he feels better he is sitting in the bedside chair for the first time He is distended but Bilateral leg edematous but improving, compression stocking in place Overnight afebrile BP stable Labs with creatinine slightly trending up, weight up at 1 one 5 pound from 113.   Assessment and Plan: Principal Problem:   Heart failure (HCC) Active Problems:   Morbid obesity (HCC)   Essential hypertension  Acute on chronic CHF with preserved EF Hypertension Acute on chronic hypoxic respiratory failure presentation likely multifactorial with acute CHF with preserved EF CHF, pneumonia.   Baseline  on 3 L [place] and improved.  Chest x-ray congested.BNP somewhat elevated. TTE-shows EF 55% with G1 DD has RV pressure/volume overload with D-shaped septum Cardiology following closely, still fluid overloaded, IV Lasix increased to 120 mg every 12 hours as his weight has increased further no significant net negative although inaccurate I/O. Monitor intake output Daily weight Net IO Since Admission: -227.68 mL [06/25/23 1344]  Filed Weights   06/23/23 0512 06/24/23 0526 06/25/23 0433  Weight: 113.4 kg 113.5 kg 115.3 kg    Recent Labs  Lab 06/21/23 1350  06/21/23 1403 06/21/23 1403 06/21/23 1723 06/22/23 0525 06/23/23 0233 06/24/23 0224 06/25/23 0226  BNP 291.1*  --   --   --   --   --   --   --   BUN  --  34*  --   --  30* 29* 27* 24*  CREATININE  --  1.64*  --   --  1.47* 1.50* 1.49* 1.60*  K  --  4.3   < > 5.7* 3.7 3.4* 4.6 3.5   < > = values in this interval not displayed.    Bacterial pneumonia: recently treated with multiple rounds of antibiotics at nursing facility.  He had no leukocytosis and procalcitonin less than 0.17 admitted with vancomycin/cefepime>  de-escalated for short course of antibiotics to cover CAP   RCC history: Continue to follow-up outpatient  Cryptogenic liver cirrhosis: Monitor volume status continue Lasix as above  CKD stage IIIb: Renal function remains slightly with increasing creatinine.  Monitor Recent Labs    03/01/23 1630 03/01/23 1711 04/20/23 1433 06/21/23 1403 06/21/23 1723 06/22/23 0525 06/23/23 0233 06/24/23 0224 06/25/23 0226  BUN 18 22 13  34*  --  30* 29* 27* 24*  CREATININE 1.13 1.20 1.18 1.64*  --  1.47* 1.50* 1.49* 1.60*  CO2 24  --  26 23  --  26 24 22 28   K 4.4 4.3 3.9 4.3 5.7* 3.7 3.4* 4.6 3.5    Hypokalemia: Resolved.  Hypertension History of SVT/WPW syndrome HLD: BP is well-controlled.  Continue diuretics and verapamil.  Statin on hold  Chronic anemia Thrombocytopenia : This finding is likely from liver cirrhosis.  Monitor   COPD with chronic respiratory  failure: Not in exacerbation. Cont home Dulera  Anxiety: Mood stable continue home meds  Class I Obesity: Estimated body mass index is 35.45 kg/m as calculated from the following:   Height as of this encounter: 5\' 11"  (1.803 m).   Weight as of this encounter: 115.3 kg.   Deconditioning debility: Continue PT OT and planning to arrange home health PT OT aide on discharge-home health ordered  Goals of care: Patient is weak and frail high risk of readmission complex co-morbidities. He will benefit with  palliative care consultation inpatient   DVT prophylaxis: enoxaparin (LOVENOX) injection 40 mg Start: 06/22/23 1000 SCDs Start: 06/22/23 0013 Code Status:   Code Status: Full Code Family Communication: plan of care discussed with patient/none at bedside. Patient status is: Remains hospitalized because of severity of illness Level of care: Telemetry Cardiac    Objective: Vitals last 24 hrs: Vitals:   06/24/23 2033 06/25/23 0105 06/25/23 0433 06/25/23 0709  BP: 124/71 119/60 127/61 (!) 122/59  Pulse: 67 71 71 62  Resp: 19 18 18 20   Temp: 98 F (36.7 C) 98.6 F (37 C) 97.9 F (36.6 C) 97.9 F (36.6 C)  TempSrc: Oral Oral Oral Oral  SpO2: 92% 94% 93% 95%  Weight:   115.3 kg   Height:       Weight change: 1.81 kg  Physical Examination: General exam: alert awake, oriented at baseline, older than 84, chronically sick looking HEENT:Oral mucosa moist, Ear/Nose WNL grossly Respiratory system: Bilaterally clear BS,no use of accessory muscle Cardiovascular system: S1 & S2 +, No JVD. Gastrointestinal system: Abdomen soft, moderately distended which is chronic per patient nontender  Nervous System: Alert, awake, moving all extremities,and following commands. Extremities: LE edema ++ with compression stocking in place Skin: No rashes,no icterus. MSK: Normal muscle bulk,tone, power   Medications reviewed:  Scheduled Meds:  azithromycin  500 mg Oral Daily   enoxaparin (LOVENOX) injection  40 mg Subcutaneous Q24H   levothyroxine  25 mcg Oral Daily   mometasone-formoterol  2 puff Inhalation BID   pantoprazole  40 mg Oral q morning   verapamil  240 mg Oral BID   Continuous Infusions:  cefTRIAXone (ROCEPHIN)  IV 2 g (06/24/23 1806)   furosemide      Diet Order             Diet Heart Room service appropriate? Yes; Fluid consistency: Thin; Fluid restriction: 1500 mL Fluid  Diet effective now                  Intake/Output Summary (Last 24 hours) at 06/25/2023  0820 Last data filed at 06/25/2023 0622 Gross per 24 hour  Intake 220 ml  Output 900 ml  Net -680 ml   Net IO Since Admission: -467.68 mL [06/25/23 0820]  Wt Readings from Last 3 Encounters:  06/25/23 115.3 kg  10/22/22 110.3 kg  10/27/21 111.6 kg     Data Reviewed: I have personally reviewed following labs and imaging studies CBC: Recent Labs  Lab 06/21/23 1349 06/21/23 1723 06/22/23 0525 06/23/23 0233 06/24/23 1045 06/25/23 0226  WBC 6.9  --  5.8 5.1 6.1 5.7  HGB 12.9* 13.3 10.6* 10.6* 12.0* 11.0*  HCT 41.3 39.0 33.7* 33.5* 37.9* 34.6*  MCV 90.4  --  91.1 88.6 89.0 89.2  PLT 144*  --  106* 115* 123* 106*   Basic Metabolic Panel:  Recent Labs  Lab 06/21/23 1403 06/21/23 1723 06/22/23 0525 06/23/23 0233 06/24/23 0224 06/25/23 0226  NA 139  136 137 141 139 138  K 4.3 5.7* 3.7 3.4* 4.6 3.5  CL 103  --  108 105 105 102  CO2 23  --  26 24 22 28   GLUCOSE 125*  --  82 87 92 91  BUN 34*  --  30* 29* 27* 24*  CREATININE 1.64*  --  1.47* 1.50* 1.49* 1.60*  CALCIUM 8.6*  --  7.1* 7.9* 7.7* 7.7*   GFR: Estimated Creatinine Clearance: 45.2 mL/min (A) (by C-G formula based on SCr of 1.6 mg/dL (H)). Liver Function Tests:  Recent Labs  Lab 06/21/23 1403  AST 54*  ALT 30  ALKPHOS 182*  BILITOT 0.7  PROT 6.5  ALBUMIN 2.9*   No results for input(s): "LIPASE", "AMYLASE" in the last 168 hours.  Recent Labs  Lab 06/22/23 1737  AMMONIA 19   Recent Labs    06/23/23 0233  CHOL 93  HDL 26*  LDLCALC 53  TRIG 68  CHOLHDL 3.6   No results for input(s): "TSH", "T4TOTAL", "FREET4", "T3FREE", "THYROIDAB" in the last 72 hours. Sepsis Labs: Recent Labs  Lab 06/22/23 0525  PROCALCITON 0.17   Recent Results (from the past 240 hours)  Resp panel by RT-PCR (RSV, Flu A&B, Covid) Anterior Nasal Swab     Status: None   Collection Time: 06/21/23  5:17 PM   Specimen: Anterior Nasal Swab  Result Value Ref Range Status   SARS Coronavirus 2 by RT PCR NEGATIVE NEGATIVE Final     Comment: (NOTE) SARS-CoV-2 target nucleic acids are NOT DETECTED.  The SARS-CoV-2 RNA is generally detectable in upper respiratory specimens during the acute phase of infection. The lowest concentration of SARS-CoV-2 viral copies this assay can detect is 138 copies/mL. A negative result does not preclude SARS-Cov-2 infection and should not be used as the sole basis for treatment or other patient management decisions. A negative result may occur with  improper specimen collection/handling, submission of specimen other than nasopharyngeal swab, presence of viral mutation(s) within the areas targeted by this assay, and inadequate number of viral copies(<138 copies/mL). A negative result must be combined with clinical observations, patient history, and epidemiological information. The expected result is Negative.  Fact Sheet for Patients:  BloggerCourse.com  Fact Sheet for Healthcare Providers:  SeriousBroker.it  This test is no t yet approved or cleared by the [place] FDA and  has been authorized for detection and/or diagnosis of SARS-CoV-2 by FDA under an Emergency Use Authorization (EUA). This EUA will remain  in effect (meaning this test can be used) for the duration of the COVID-19 declaration under Section 564(b)(1) of the Act, 21 U.S.C.section 360bbb-3(b)(1), unless the authorization is terminated  or revoked sooner.       Influenza A by PCR NEGATIVE NEGATIVE Final   Influenza B by PCR NEGATIVE NEGATIVE Final    Comment: (NOTE) The Xpert Xpress SARS-CoV-2/FLU/RSV plus assay is intended as an aid in the diagnosis of influenza from Nasopharyngeal swab specimens and should not be used as a sole basis for treatment. Nasal washings and aspirates are unacceptable for Xpert Xpress SARS-CoV-2/FLU/RSV testing.  Fact Sheet for Patients: BloggerCourse.com  Fact Sheet for Healthcare  Providers: SeriousBroker.it  This test is not yet approved or cleared by the [place] FDA and has been authorized for detection and/or diagnosis of SARS-CoV-2 by FDA under an Emergency Use Authorization (EUA). This EUA will remain in effect (meaning this test can be used) for the duration of the COVID-19 declaration under Section 564(b)(1) of the  Act, 21 U.S.C. section 360bbb-3(b)(1), unless the authorization is terminated or revoked.     Resp Syncytial Virus by PCR NEGATIVE NEGATIVE Final    Comment: (NOTE) Fact Sheet for Patients: BloggerCourse.com  Fact Sheet for Healthcare Providers: SeriousBroker.it  This test is not yet approved or cleared by the [place] FDA and has been authorized for detection and/or diagnosis of SARS-CoV-2 by FDA under an Emergency Use Authorization (EUA). This EUA will remain in effect (meaning this test can be used) for the duration of the COVID-19 declaration under Section 564(b)(1) of the Act, 21 U.S.C. section 360bbb-3(b)(1), unless the authorization is terminated or revoked.  Performed at [facility], [address] [place], [place] [address]   MRSA Next Gen by PCR, Nasal     Status: None   Collection Time: 06/22/23  7:56 AM   Specimen: Nasal Mucosa; Nasal Swab  Result Value Ref Range Status   MRSA by PCR Next Gen NOT DETECTED NOT DETECTED Final    Comment: (NOTE) The GeneXpert MRSA Assay (FDA approved for NASAL specimens only), is one component of a comprehensive MRSA colonization surveillance program. It is not intended to diagnose MRSA infection nor to guide or monitor treatment for MRSA infections. Test performance is not FDA approved in patients less than 80 years old. Performed at [facility], [address], [place], [place] [address]       Radiology Studies: No results found.    LOS: 3 days   Total time spent in  review of labs and imaging, patient evaluation, formulation of plan, documentation and communication with family: 35 minutes  Lanae Boast, DO  [facility]  06/25/2023, 8:20 AM

## 2023-06-26 DIAGNOSIS — I5033 Acute on chronic diastolic (congestive) heart failure: Secondary | ICD-10-CM | POA: Diagnosis not present

## 2023-06-26 LAB — BASIC METABOLIC PANEL
Anion gap: 10 (ref 5–15)
BUN: 28 mg/dL — ABNORMAL HIGH (ref 8–23)
CO2: 25 mmol/L (ref 22–32)
Calcium: 7.7 mg/dL — ABNORMAL LOW (ref 8.9–10.3)
Chloride: 101 mmol/L (ref 98–111)
Creatinine, Ser: 1.67 mg/dL — ABNORMAL HIGH (ref 0.61–1.24)
GFR, Estimated: 40 mL/min — ABNORMAL LOW (ref >60)
Glucose, Bld: 93 mg/dL (ref 70–99)
Potassium: 3.4 mmol/L — ABNORMAL LOW (ref 3.5–5.1)
Sodium: 136 mmol/L (ref 135–145)

## 2023-06-26 LAB — CBC
HCT: 34.3 % — ABNORMAL LOW (ref 39.0–52.0)
Hemoglobin: 11 g/dL — ABNORMAL LOW (ref 13.0–17.0)
MCH: 28.4 pg (ref 26.0–34.0)
MCHC: 32.1 g/dL (ref 30.0–36.0)
MCV: 88.6 fL (ref 80.0–100.0)
Platelets: 119 10*3/uL — ABNORMAL LOW (ref 150–400)
RBC: 3.87 MIL/uL — ABNORMAL LOW (ref 4.22–5.81)
RDW: 16.4 % — ABNORMAL HIGH (ref 11.5–15.5)
WBC: 6.2 10*3/uL (ref 4.0–10.5)
nRBC: 0 % (ref 0.0–0.2)

## 2023-06-26 MED ORDER — FUROSEMIDE 10 MG/ML IJ SOLN
8.0000 mg/h | INTRAVENOUS | Status: AC
  Administered 2023-06-26: 8 mg/h via INTRAVENOUS
  Filled 2023-06-26: qty 20

## 2023-06-26 MED ORDER — POTASSIUM CHLORIDE CRYS ER 20 MEQ PO TBCR
40.0000 meq | EXTENDED_RELEASE_TABLET | Freq: Two times a day (BID) | ORAL | Status: DC
  Administered 2023-06-26 – 2023-06-28 (×5): 40 meq via ORAL
  Filled 2023-06-26 (×5): qty 2

## 2023-06-26 NOTE — Progress Notes (Signed)
 Progress Note  Patient Name: Chad Hogan Date of Encounter: 06/26/2023  Primary Cardiologist: Sherryl Manges, MD   Subjective   Patient seen examined his bedside.  Offers no complaints at this time.    Inpatient Medications    Scheduled Meds:  azithromycin  500 mg Oral Daily   enoxaparin (LOVENOX) injection  40 mg Subcutaneous Q24H   levothyroxine  25 mcg Oral Daily   mometasone-formoterol  2 puff Inhalation BID   pantoprazole  40 mg Oral q morning   verapamil  240 mg Oral BID   Continuous Infusions:  cefTRIAXone (ROCEPHIN)  IV 2 g (06/25/23 1729)   furosemide 120 mg (06/25/23 2214)   PRN Meds: acetaminophen **OR** acetaminophen, ALPRAZolam, guaiFENesin, ipratropium-albuterol, ondansetron **OR** ondansetron (ZOFRAN) IV, oxyCODONE   Vital Signs    Vitals:   06/26/23 0500 06/26/23 0502 06/26/23 0805 06/26/23 0820  BP:  121/66 130/63   Pulse:  71    Resp:  (!) 25    Temp:   98.4 F (36.9 C)   TempSrc: Oral Oral Oral   SpO2:  95%  93%  Weight:      Height:        Intake/Output Summary (Last 24 hours) at 06/26/2023 0919 Last data filed at 06/26/2023 0502 Gross per 24 hour  Intake 240 ml  Output 875 ml  Net -635 ml   Filed Weights   06/24/23 0526 06/25/23 0433 06/26/23 0446  Weight: 113.5 kg 115.3 kg 116 kg    Telemetry    Sinus rhythm- Personally Reviewed  ECG     - Personally Reviewed  Physical Exam    General: Comfortable, sitting up in a chair Head: Atraumatic, normal size  Eyes: PEERLA, EOMI  Neck: Supple, normal JVD Cardiac: Normal S1, S2; RRR; no murmurs, rubs, or gallops Lungs: Clear to auscultation bilaterally Abd: Soft, nontender, no hepatomegaly  Ext: warm, +3 bilateral peripheral edema/bilateral redness however no tenderness. Musculoskeletal: No deformities, BUE and BLE strength normal and equal Skin: Warm and dry, no rashes   Neuro: Alert and oriented to person, place, time, and situation, CNII-XII grossly intact, no focal deficits   Psych: Normal mood and affect   Labs    Chemistry Recent Labs  Lab 06/21/23 1403 06/21/23 1723 06/24/23 0224 06/25/23 0226 06/26/23 0307  NA 139   < > 139 138 136  K 4.3   < > 4.6 3.5 3.4*  CL 103   < > 105 102 101  CO2 23   < > 22 28 25   GLUCOSE 125*   < > 92 91 93  BUN 34*   < > 27* 24* 28*  CREATININE 1.64*   < > 1.49* 1.60* 1.67*  CALCIUM 8.6*   < > 7.7* 7.7* 7.7*  PROT 6.5  --   --   --   --   ALBUMIN 2.9*  --   --   --   --   AST 54*  --   --   --   --   ALT 30  --   --   --   --   ALKPHOS 182*  --   --   --   --   BILITOT 0.7  --   --   --   --   GFRNONAA 41*   < > 46* 42* 40*  ANIONGAP 13   < > 12 8 10    < > = values in this interval not displayed.     Hematology  Recent Labs  Lab 06/24/23 1045 06/25/23 0226 06/26/23 0307  WBC 6.1 5.7 6.2  RBC 4.26 3.88* 3.87*  HGB 12.0* 11.0* 11.0*  HCT 37.9* 34.6* 34.3*  MCV 89.0 89.2 88.6  MCH 28.2 28.4 28.4  MCHC 31.7 31.8 32.1  RDW 16.5* 16.4* 16.4*  PLT 123* 106* 119*    Cardiac EnzymesNo results for input(s): "TROPONINI" in the last 168 hours. No results for input(s): "TROPIPOC" in the last 168 hours.   BNP Recent Labs  Lab 06/21/23 1350  BNP 291.1*     DDimer No results for input(s): "DDIMER" in the last 168 hours.   Radiology    No results found.   Cardiac Studies   Reviewed recent echocardiogram  Patient Profile     83 y.o. male history of HTN, HLD, CAD, history of PE, CKD stage 3b, SVT, WPW, COPD, HFpEF, OSA on CPAP, RCC, liver cirrhosis, recent SAH s/p fall. He presented to [facility] ED [facility]/21/2023 complaining of leg swelling, weakness, dizziness, increased shortness of breath.   Assessment & Plan    Acute on chronic heart failure with preserved ejection fraction Hypertension History of VT with loss of exacerbation, status post complete ablation of left lateral accessory pathway CKD stage 3b Bacterial PNA - managed by the primary team   Despite increasing his Lasix dose to 120  mg he still appears to be significantly volume overloaded and minimal output -635 mL.  He appeared to gain 3 pounds compared to yesterday's weight.    His creatinine is still at his baseline, at this point I think he would benefit from a 24-hour Lasix drip and see if that would make a difference in terms of his urine output and clinically volume improvement. Please get a bladder scan of this patient as well.  Will have to aggressively replete his potassium, he is 3 for today. Creatinine stable will continue to monitor this is why he is on diuretics.  Blood pressure is at target today  In sinus rhythm, please continue with her current verapamil dose.    We will continue to follow with you.      For questions or updates, please contact [facility] Please consult [email or web address] for contact info under Cardiology/STEMI.      Signed, Thomasene Ripple, DO  06/26/2023, 9:19 AM

## 2023-06-26 NOTE — Progress Notes (Signed)
 PROGRESS NOTE Chad Hogan  NWG:[id number] DOB: 05/08/1940 DOA: 06/21/2023 PCP: Casper Harrison, [name], MD    Brief Narrative/Hospital Course: 83 yom w/ HTN HLD, CAD, PE, CKD stage IIIb who presented to ED w/ complaints of dizziness tiredness and lightheadedness  and difficulty ambulating and shortness of breath and progressively worsening swelling in his legs and abdomen over the last week , treated at his nursing home for recurrent pneumonia with multiple rounds of antibiotics without improvement. In the ED hemodynamically stable afebrile labs reviewed normal wbc, somewhat elevated BNP 291 creatinine 1.6 AST 54 ALP 102 troponin 11>9 9, chest x-ray left-sided pleural effusion and opacities as well as pulmonary venous congestion and patient not admitted for further workup, started on IV antibiotics given Lasix 40 mg.  Consultation: Cardiology    Subjective: Patient seen and examined Leg swelling improving but still appears swollen On 3 L nasal cannula, labs with hypokalemia 3.4 creatinine stable 1.6   Assessment and Plan: Principal Problem:   Heart failure (HCC) Active Problems:   Morbid obesity (HCC)   Essential hypertension  Acute on chronic CHF with preserved EF Hypertension Acute on chronic hypoxic respiratory failure presentation multifactorial with acute CHF with preserved EF CHF, pneumonia.   Baseline  on 3 L [place] and improved.  Chest x-ray congested.BNP somewhat elevated. TTE-shows EF 55% with G1 DD has RV pressure/volume overload with D-shaped septum Cardiology following, despite IV Lasix 120 mg still appears fluid overloaded, creatinine holding stable, appreciate cardiology input, noted plan for Lasix drip x 24 hours, monitor potassium and monitor intake output, weight increasing.  Net IO Since Admission: -982.68 mL [06/26/23 1115]  Filed Weights   06/24/23 0526 06/25/23 0433 06/26/23 0446  Weight: 113.5 kg 115.3 kg 116 kg    Recent Labs  Lab 06/21/23 1350 06/21/23 1403  06/22/23 0525 06/23/23 0233 06/24/23 0224 06/25/23 0226 06/26/23 0307  BNP 291.1*  --   --   --   --   --   --   BUN  --    < > 30* 29* 27* 24* 28*  CREATININE  --    < > 1.47* 1.50* 1.49* 1.60* 1.67*  K  --    < > 3.7 3.4* 4.6 3.5 3.4*   < > = values in this interval not displayed.    Bacterial pneumonia: recently treated with multiple rounds of antibiotics at nursing facility.  He had no leukocytosis and procalcitonin less than 0.17 admitted with vancomycin/cefepime>  de-escalated for short course of antibiotics to cover CAP   RCC history: Continue to follow-up outpatient  Cryptogenic liver cirrhosis: Monitor volume status continue Lasix as above  CKD stage IIIb: Renal function remains overall stable despite on high-dose Lasix.  Monitor  Recent Labs    03/01/23 1630 03/01/23 1711 04/20/23 1433 06/21/23 1403 06/21/23 1723 06/22/23 0525 06/23/23 0233 06/24/23 0224 06/25/23 0226 06/26/23 0307  BUN 18 22 13  34*  --  30* 29* 27* 24* 28*  CREATININE 1.13 1.20 1.18 1.64*  --  1.47* 1.50* 1.49* 1.60* 1.67*  CO2 24  --  26 23  --  26 24 22 28 25   K 4.4 4.3 3.9 4.3   < > 3.7 3.4* 4.6 3.5 3.4*   < > = values in this interval not displayed.    Hypokalemia: Resolved.  Hypertension History of SVT/WPW syndrome HLD: BP is well-controlled.Continue diuretics and verapamil.  Statin on hold  Chronic anemia Thrombocytopenia : This finding is likely from liver cirrhosis.  Monitor  COPD with chronic respiratory failure: Not in exacerbation. Cont home Dulera  Anxiety: Mood stable continue home meds  Class I Obesity: Estimated body mass index is 35.67 kg/m as calculated from the following:   Height as of this encounter: 5\' 11"  (1.803 m).   Weight as of this encounter: 116 kg.   Deconditioning debility: Continue PT OT and planning to arrange home health PT OT aide on discharge-home health ordered  Goals of care: Patient is weak and frail high risk of readmission complex  co-morbidities. He will benefit with palliative care consultation inpatient   DVT prophylaxis: enoxaparin (LOVENOX) injection 40 mg Start: 06/22/23 1000 SCDs Start: 06/22/23 0013 Code Status:   Code Status: Full Code Family Communication: plan of care discussed with patient/none at bedside. Patient status is: Remains hospitalized because of severity of illness Level of care: Telemetry Cardiac   Disposition: Plan for home with home health after fluid overload improves  Objective: Vitals last 24 hrs: Vitals:   06/26/23 0500 06/26/23 0502 06/26/23 0805 06/26/23 0820  BP:  121/66 130/63   Pulse:  71 65   Resp:  (!) 25 18   Temp:   98.4 F (36.9 C)   TempSrc: Oral Oral Oral   SpO2:  95% 94% 93%  Weight:      Height:       Weight change: 0.7 kg  Physical Examination: General exam: alert awake, oriented at baseline, 83 years old HEENT:Oral mucosa moist, Ear/Nose WNL grossly Respiratory system: Bilaterally clear BS,no use of accessory muscle Cardiovascular system: S1 & S2 +, No JVD. Gastrointestinal system: Abdomen soft, distended  BS+ Nervous System: Alert, awake, moving all extremities,and following commands. Extremities: LE edema ++ compression/ted hose+,distal peripheral pulses palpable and warm.  Skin: No rashes,no icterus. MSK: Normal muscle bulk,tone, power   Medications reviewed:  Scheduled Meds:  azithromycin  500 mg Oral Daily   enoxaparin (LOVENOX) injection  40 mg Subcutaneous Q24H   levothyroxine  25 mcg Oral Daily   mometasone-formoterol  2 puff Inhalation BID   pantoprazole  40 mg Oral q morning   potassium chloride  40 mEq Oral BID   verapamil  240 mg Oral BID   Continuous Infusions:  cefTRIAXone (ROCEPHIN)  IV 2 g (06/25/23 1729)   furosemide (LASIX) 200 mg in dextrose 5 % 100 mL (2 mg/mL) infusion      Diet Order             Diet Heart Room service appropriate? Yes; Fluid consistency: Thin; Fluid restriction: 1500 mL Fluid  Diet effective now                   Intake/Output Summary (Last 24 hours) at 06/26/2023 1115 Last data filed at 06/26/2023 0502 Gross per 24 hour  Intake 240 ml  Output 875 ml  Net -635 ml   Net IO Since Admission: -982.68 mL [06/26/23 1115]  Wt Readings from Last 3 Encounters:  06/26/23 116 kg  10/22/22 110.3 kg  10/27/21 111.6 kg     Data Reviewed: I have personally reviewed following labs and imaging studies CBC: Recent Labs  Lab 06/22/23 0525 06/23/23 0233 06/24/23 1045 06/25/23 0226 06/26/23 0307  WBC 5.8 5.1 6.1 5.7 6.2  HGB 10.6* 10.6* 12.0* 11.0* 11.0*  HCT 33.7* 33.5* 37.9* 34.6* 34.3*  MCV 91.1 88.6 89.0 89.2 88.6  PLT 106* 115* 123* 106* 119*   Basic Metabolic Panel:  Recent Labs  Lab 06/22/23 0525 06/23/23 0233 06/24/23 0224 06/25/23 0102  06/26/23 0307  NA 137 141 139 138 136  K 3.7 3.4* 4.6 3.5 3.4*  CL 108 105 105 102 101  CO2 26 24 22 28 25   GLUCOSE 82 87 92 91 93  BUN 30* 29* 27* 24* 28*  CREATININE 1.47* 1.50* 1.49* 1.60* 1.67*  CALCIUM 7.1* 7.9* 7.7* 7.7* 7.7*   GFR: Estimated Creatinine Clearance: 43.4 mL/min (A) (by C-G formula based on SCr of 1.67 mg/dL (H)). Liver Function Tests:  Recent Labs  Lab 06/21/23 1403  AST 54*  ALT 30  ALKPHOS 182*  BILITOT 0.7  PROT 6.5  ALBUMIN 2.9*   No results for input(s): "LIPASE", "AMYLASE" in the last 168 hours.  Recent Labs  Lab 06/22/23 1737  AMMONIA 19   No results for input(s): "CHOL", "HDL", "LDLCALC", "TRIG", "CHOLHDL", "LDLDIRECT" in the last 72 hours.  No results for input(s): "TSH", "T4TOTAL", "FREET4", "T3FREE", "THYROIDAB" in the last 72 hours. Sepsis Labs: Recent Labs  Lab 06/22/23 0525  PROCALCITON 0.17   Recent Results (from the past 240 hours)  Resp panel by RT-PCR (RSV, Flu A&B, Covid) Anterior Nasal Swab     Status: None   Collection Time: 06/21/23  5:17 PM   Specimen: Anterior Nasal Swab  Result Value Ref Range Status   SARS Coronavirus 2 by RT PCR NEGATIVE NEGATIVE Final     Comment: (NOTE) SARS-CoV-2 target nucleic acids are NOT DETECTED.  The SARS-CoV-2 RNA is generally detectable in upper respiratory specimens during the acute phase of infection. The lowest concentration of SARS-CoV-2 viral copies this assay can detect is 138 copies/mL. A negative result does not preclude SARS-Cov-2 infection and should not be used as the sole basis for treatment or other patient management decisions. A negative result may occur with  improper specimen collection/handling, submission of specimen other than nasopharyngeal swab, presence of viral mutation(s) within the areas targeted by this assay, and inadequate number of viral copies(<138 copies/mL). A negative result must be combined with clinical observations, patient history, and epidemiological information. The expected result is Negative.  Fact Sheet for Patients:  BloggerCourse.com  Fact Sheet for Healthcare Providers:  SeriousBroker.it  This test is no t yet approved or cleared by the [place] FDA and  has been authorized for detection and/or diagnosis of SARS-CoV-2 by FDA under an Emergency Use Authorization (EUA). This EUA will remain  in effect (meaning this test can be used) for the duration of the COVID-19 declaration under Section 564(b)(1) of the Act, 21 U.S.C.section 360bbb-3(b)(1), unless the authorization is terminated  or revoked sooner.       Influenza A by PCR NEGATIVE NEGATIVE Final   Influenza B by PCR NEGATIVE NEGATIVE Final    Comment: (NOTE) The Xpert Xpress SARS-CoV-2/FLU/RSV plus assay is intended as an aid in the diagnosis of influenza from Nasopharyngeal swab specimens and should not be used as a sole basis for treatment. Nasal washings and aspirates are unacceptable for Xpert Xpress SARS-CoV-2/FLU/RSV testing.  Fact Sheet for Patients: BloggerCourse.com  Fact Sheet for Healthcare  Providers: SeriousBroker.it  This test is not yet approved or cleared by the [place] FDA and has been authorized for detection and/or diagnosis of SARS-CoV-2 by FDA under an Emergency Use Authorization (EUA). This EUA will remain in effect (meaning this test can be used) for the duration of the COVID-19 declaration under Section 564(b)(1) of the Act, 21 U.S.C. section 360bbb-3(b)(1), unless the authorization is terminated or revoked.     Resp Syncytial Virus by PCR NEGATIVE NEGATIVE  Final    Comment: (NOTE) Fact Sheet for Patients: BloggerCourse.com  Fact Sheet for Healthcare Providers: SeriousBroker.it  This test is not yet approved or cleared by the [place] FDA and has been authorized for detection and/or diagnosis of SARS-CoV-2 by FDA under an Emergency Use Authorization (EUA). This EUA will remain in effect (meaning this test can be used) for the duration of the COVID-19 declaration under Section 564(b)(1) of the Act, 21 U.S.C. section 360bbb-3(b)(1), unless the authorization is terminated or revoked.  Performed at [facility], [address] [place], [place] [address]   MRSA Next Gen by PCR, Nasal     Status: None   Collection Time: 06/22/23  7:56 AM   Specimen: Nasal Mucosa; Nasal Swab  Result Value Ref Range Status   MRSA by PCR Next Gen NOT DETECTED NOT DETECTED Final    Comment: (NOTE) The GeneXpert MRSA Assay (FDA approved for NASAL specimens only), is one component of a comprehensive MRSA colonization surveillance program. It is not intended to diagnose MRSA infection nor to guide or monitor treatment for MRSA infections. Test performance is not FDA approved in patients less than 58 years old. Performed at [facility], [address] [place], [place] [address]       Radiology Studies: No results found.    LOS: 4 days   Total time spent in  review of labs and imaging, patient evaluation, formulation of plan, documentation and communication with family: 35 minutes  Lanae Boast, DO  [facility]  06/26/2023, 11:15 AM

## 2023-06-26 NOTE — Plan of Care (Signed)
   Problem: Education: Goal: Knowledge of General Education information will improve Description: Including pain rating scale, medication(s)/side effects and non-pharmacologic comfort measures Outcome: Progressing   Problem: Clinical Measurements: Goal: Will remain free from infection Outcome: Progressing   Problem: Clinical Measurements: Goal: Diagnostic test results will improve Outcome: Progressing   Problem: Clinical Measurements: Goal: Respiratory complications will improve Outcome: Progressing   Problem: Clinical Measurements: Goal: Cardiovascular complication will be avoided Outcome: Progressing   Problem: Activity: Goal: Risk for activity intolerance will decrease Outcome: Progressing

## 2023-06-27 ENCOUNTER — Inpatient Hospital Stay (HOSPITAL_COMMUNITY)

## 2023-06-27 DIAGNOSIS — I5033 Acute on chronic diastolic (congestive) heart failure: Secondary | ICD-10-CM | POA: Diagnosis not present

## 2023-06-27 DIAGNOSIS — E876 Hypokalemia: Secondary | ICD-10-CM

## 2023-06-27 DIAGNOSIS — I10 Essential (primary) hypertension: Secondary | ICD-10-CM

## 2023-06-27 LAB — BASIC METABOLIC PANEL
Anion gap: 9 (ref 5–15)
BUN: 32 mg/dL — ABNORMAL HIGH (ref 8–23)
CO2: 26 mmol/L (ref 22–32)
Calcium: 8 mg/dL — ABNORMAL LOW (ref 8.9–10.3)
Chloride: 102 mmol/L (ref 98–111)
Creatinine, Ser: 1.74 mg/dL — ABNORMAL HIGH (ref 0.61–1.24)
GFR, Estimated: 38 mL/min — ABNORMAL LOW (ref >60)
Glucose, Bld: 94 mg/dL (ref 70–99)
Potassium: 4.3 mmol/L (ref 3.5–5.1)
Sodium: 137 mmol/L (ref 135–145)

## 2023-06-27 LAB — CBC
HCT: 35.9 % — ABNORMAL LOW (ref 39.0–52.0)
Hemoglobin: 11.5 g/dL — ABNORMAL LOW (ref 13.0–17.0)
MCH: 28.1 pg (ref 26.0–34.0)
MCHC: 32 g/dL (ref 30.0–36.0)
MCV: 87.8 fL (ref 80.0–100.0)
Platelets: 128 10*3/uL — ABNORMAL LOW (ref 150–400)
RBC: 4.09 MIL/uL — ABNORMAL LOW (ref 4.22–5.81)
RDW: 16.3 % — ABNORMAL HIGH (ref 11.5–15.5)
WBC: 6.5 10*3/uL (ref 4.0–10.5)
nRBC: 0 % (ref 0.0–0.2)

## 2023-06-27 MED ORDER — DOCUSATE SODIUM 100 MG PO CAPS
100.0000 mg | ORAL_CAPSULE | Freq: Two times a day (BID) | ORAL | Status: DC
  Administered 2023-06-27 – 2023-07-06 (×11): 100 mg via ORAL
  Filled 2023-06-27 (×15): qty 1

## 2023-06-27 MED ORDER — POLYETHYLENE GLYCOL 3350 17 G PO PACK
17.0000 g | PACK | Freq: Every day | ORAL | Status: DC
  Administered 2023-06-29 – 2023-07-05 (×4): 17 g via ORAL
  Filled 2023-06-27 (×8): qty 1

## 2023-06-27 MED ORDER — SENNA 8.6 MG PO TABS
1.0000 | ORAL_TABLET | Freq: Every day | ORAL | Status: DC
  Administered 2023-06-27 – 2023-07-06 (×6): 8.6 mg via ORAL
  Filled 2023-06-27 (×6): qty 1

## 2023-06-27 MED ORDER — FUROSEMIDE 10 MG/ML IJ SOLN
12.0000 mg/h | INTRAVENOUS | Status: AC
  Administered 2023-06-27: 8 mg/h via INTRAVENOUS
  Filled 2023-06-27: qty 20

## 2023-06-27 NOTE — Progress Notes (Signed)
 Progress Note   Patient: Chad Hogan HYQ:[id number] DOB: 07-Jul-1940 DOA: 06/21/2023     5 DOS: the patient was seen and examined on 06/27/2023   Brief hospital course: 79 yom w/ HTN HLD, CAD, PE, CKD stage IIIb who presented to ED w/ complaints of dizziness tiredness and lightheadedness  and difficulty ambulating and shortness of breath and progressively worsening swelling in his legs and abdomen over the last week , treated at his nursing home for recurrent pneumonia with multiple rounds of antibiotics without improvement. In the ED hemodynamically stable afebrile labs reviewed normal wbc, somewhat elevated BNP 291 creatinine 1.6 AST 54 ALP 102 troponin 11>9 9, chest x-ray left-sided pleural effusion and opacities as well as pulmonary venous congestion and patient not admitted for further workup, started on IV antibiotics given Lasix 40 mg.  Consultation: Cardiology  Assessment and Plan: Acute on chronic CHF with preserved EF Hypertension Acute on chronic hypoxic respiratory failure Baseline on 3 L [place] and improved.  Chest x-ray congested.BNP elevated. TTE-shows EF 55% with G1 DD has RV pressure/volume overload with D-shaped septum Cardiology following, continue  Lasix drip and continue to monitor daily weights, strict I/O, monitor potassium and replace accordingly.  Net IO Since Admission: -1482.68 mL if correct.  Bacterial pneumonia: Recently treated with multiple rounds of antibiotics at nursing facility.  He had no leukocytosis and procalcitonin less than 0.17, de-escalated for short course of antibiotics to cover CAP.   RCC history: Continue to follow-up outpatient   Cryptogenic liver cirrhosis: Monitor volume status continue Lasix as above   Acute on CKD stage IIIb: Renal function worsening on Lasix gtt.  Continue to monitor daily renal function. Avoid nephrotoxic drugs.  Hypokalemia: Resolved. Monitor daily electrolytes and replace accordingly.   Hypertension History of  SVT/WPW syndrome HLD: BP is well-controlled..Continue diuretics and verapamil.  Statin on hold   Chronic anemia Thrombocytopenia : This finding is likely from liver cirrhosis. Continue to monitor.   COPD with chronic respiratory failure: Not in exacerbation. Cont home Dulera   Anxiety: Mood stable continue home meds   Class I Obesity: Estimated body mass index is 35.67 kg/m as calculated. Diet, weight reduction advised.   Deconditioning debility: PT/ OT follow up. Home health to be arranged prior to discharge.  Goals of care: Patient is weak and frail high risk of readmission complex co-morbidities.  He will benefit with palliative care evaluation.    Out of bed to chair. Incentive spirometry. Nursing supportive care. Fall, aspiration precautions. Diet:  Diet Orders (From admission, onward)     Start     Ordered   06/22/23 0755  Diet Heart Room service appropriate? Yes; Fluid consistency: Thin; Fluid restriction: 1500 mL Fluid  Diet effective now       Question Answer Comment  Room service appropriate? Yes   Fluid consistency: Thin   Fluid restriction: 1500 mL Fluid      06/22/23 0754           DVT prophylaxis: enoxaparin (LOVENOX) injection 40 mg Start: 06/22/23 1000 SCDs Start: 06/22/23 0013  Level of care: cardiac tele   Code Status: Full Code  Subjective: Patient is seen and examined today morning. He feels weak, eating poor. Able to tolerate lasix drip. Did not get out of bed.   Physical Exam: Vitals:   06/27/23 1504 06/27/23 1930 06/27/23 2035 06/27/23 2115  BP: 120/70 119/68  118/73  Pulse: 77 60 75   Resp: 19 20 (!) 22   Temp: 98.1 F (  36.7 C) 98.5 F (36.9 C)    TempSrc: Oral Oral    SpO2: 90% 94% 94%   Weight:      Height:        General - Elderly Caucasian ill male, no apparent distress HEENT - PERRLA, EOMI, atraumatic head, non tender sinuses. Lung - Clear, diffuse rales, rhonchi, no wheezes. Heart - S1, S2 heard, no murmurs,  rubs, 2+ anasarca, pedal edema. Abdomen - Soft, non tender, distended, bowel sounds good Neuro - Alert, awake and oriented x 3, non focal exam. Skin - Warm and dry.  Data Reviewed:      Latest Ref Rng & Units 06/27/2023    2:44 AM 06/26/2023    3:07 AM 06/25/2023    2:26 AM  CBC  WBC 4.0 - 10.5 K/uL 6.5  6.2  5.7   Hemoglobin 13.0 - 17.0 g/dL 96.0  45.4  09.8   Hematocrit 39.0 - 52.0 % 35.9  34.3  34.6   Platelets 150 - 400 K/uL 128  119  106       Latest Ref Rng & Units 06/27/2023    2:44 AM 06/26/2023    3:07 AM 06/25/2023    2:26 AM  BMP  Glucose 70 - 99 mg/dL 94  93  91   BUN 8 - 23 mg/dL 32  28  24   Creatinine 0.61 - 1.24 mg/dL 1.19  1.47  8.29   Sodium 135 - 145 mmol/L 137  136  138   Potassium 3.5 - 5.1 mmol/L 4.3  3.4  3.5   Chloride 98 - 111 mmol/L 102  101  102   CO2 22 - 32 mmol/L 26  25  28    Calcium 8.9 - 10.3 mg/dL 8.0  7.7  7.7    DG CHEST PORT 1 VIEW Result Date: 06/27/2023 CLINICAL DATA:  Short of breath. EXAM: PORTABLE CHEST 1 VIEW COMPARISON:  06/22/2023.  CT, 12/20/2016. FINDINGS: Cardiac silhouette normal in size.  No mediastinal or hilar masses. Bilateral interstitial thickening. Additional opacity at the left lung base obscures hemidiaphragm. Mild hazy airspace opacity noted in the right upper lower lung and left mid lung. Suspect small pleural effusion on the left. No pneumothorax. Skeletal structures are demineralized, but grossly intact. IMPRESSION: 1. Bilateral interstitial thickening and hazy airspace opacities suspicious for pulmonary edema. 2. Left lower lung opacity consistent with atelectasis and/or pneumonia with a probable small pleural effusion. Electronically Signed   By: Amie Portland M.D.   On: 06/27/2023 13:56    Family Communication: Discussed with patient, he understand and agree. All questions answered.  Disposition: Status is: Inpatient Remains inpatient appropriate because: IV diuresis, weight. I/O monitoring.  Planned Discharge  Destination: Home with Home Health     MDM level 3- patent has significant fluid overload, on IV lasix drip. He will need close hemodynamic, telemetry, electrolyte monitoring. Patient is at high risk for sudden clinical deterioration.  Author: Marcelino Duster, MD 06/27/2023 9:27 PM Secure chat 7am to 7pm For on call review [email or web address].

## 2023-06-27 NOTE — Plan of Care (Signed)
 Patient still having moderate urine output on lasix gtts.  Vitals remained stable through shift and patient did well.

## 2023-06-27 NOTE — Progress Notes (Signed)
 Progress Note  Patient Name: Chad Hogan Date of Encounter: 06/27/2023  Primary Cardiologist: Sherryl Manges, MD   Subjective   Patient seen examined his bedside.  Offers no complaints at this time.    Inpatient Medications    Scheduled Meds:  azithromycin  500 mg Oral Daily   enoxaparin (LOVENOX) injection  40 mg Subcutaneous Q24H   levothyroxine  25 mcg Oral Daily   mometasone-formoterol  2 puff Inhalation BID   pantoprazole  40 mg Oral q morning   potassium chloride  40 mEq Oral BID   verapamil  240 mg Oral BID   Continuous Infusions:  cefTRIAXone (ROCEPHIN)  IV 2 g (06/26/23 1745)   furosemide (LASIX) 200 mg in dextrose 5 % 100 mL (2 mg/mL) infusion 8 mg/hr (06/26/23 1434)   PRN Meds: acetaminophen **OR** acetaminophen, ALPRAZolam, guaiFENesin, ipratropium-albuterol, ondansetron **OR** ondansetron (ZOFRAN) IV, oxyCODONE   Vital Signs    Vitals:   06/26/23 2147 06/27/23 0515 06/27/23 0718 06/27/23 0748  BP: 126/67 124/66 115/64   Pulse:  75 70   Resp:  20 (!) 22   Temp:  98 F (36.7 C) 97.9 F (36.6 C)   TempSrc:  Oral Oral   SpO2:  95% 94% 93%  Weight:  116.1 kg    Height:        Intake/Output Summary (Last 24 hours) at 06/27/2023 0949 Last data filed at 06/27/2023 0900 Gross per 24 hour  Intake 800 ml  Output 875 ml  Net -75 ml   Filed Weights   06/25/23 0433 06/26/23 0446 06/27/23 0515  Weight: 115.3 kg 116 kg 116.1 kg    Telemetry    Sinus rhythm- Personally Reviewed  ECG     - Personally Reviewed  Physical Exam    General: Comfortable, sitting up in a chair Head: Atraumatic, normal size  Eyes: PEERLA, EOMI  Neck: Supple, normal JVD Cardiac: Normal S1, S2; RRR; no murmurs, rubs, or gallops Lungs: Clear to auscultation bilaterally Abd: Soft, nontender, no hepatomegaly  Ext: warm, +3 bilateral peripheral edema/bilateral redness however no tenderness. Musculoskeletal: No deformities, BUE and BLE strength normal and equal Skin: Warm and  dry, no rashes   Neuro: Alert and oriented to person, place, time, and situation, CNII-XII grossly intact, no focal deficits  Psych: Normal mood and affect   Labs    Chemistry Recent Labs  Lab 06/21/23 1403 06/21/23 1723 06/25/23 0226 06/26/23 0307 06/27/23 0244  NA 139   < > 138 136 137  K 4.3   < > 3.5 3.4* 4.3  CL 103   < > 102 101 102  CO2 23   < > 28 25 26   GLUCOSE 125*   < > 91 93 94  BUN 34*   < > 24* 28* 32*  CREATININE 1.64*   < > 1.60* 1.67* 1.74*  CALCIUM 8.6*   < > 7.7* 7.7* 8.0*  PROT 6.5  --   --   --   --   ALBUMIN 2.9*  --   --   --   --   AST 54*  --   --   --   --   ALT 30  --   --   --   --   ALKPHOS 182*  --   --   --   --   BILITOT 0.7  --   --   --   --   GFRNONAA 41*   < > 42* 40* 38*  ANIONGAP 13   < > 8 10 9    < > = values in this interval not displayed.     Hematology Recent Labs  Lab 06/25/23 0226 06/26/23 0307 06/27/23 0244  WBC 5.7 6.2 6.5  RBC 3.88* 3.87* 4.09*  HGB 11.0* 11.0* 11.5*  HCT 34.6* 34.3* 35.9*  MCV 89.2 88.6 87.8  MCH 28.4 28.4 28.1  MCHC 31.8 32.1 32.0  RDW 16.4* 16.4* 16.3*  PLT 106* 119* 128*    Cardiac EnzymesNo results for input(s): "TROPONINI" in the last 168 hours. No results for input(s): "TROPIPOC" in the last 168 hours.   BNP Recent Labs  Lab 06/21/23 1350  BNP 291.1*     DDimer No results for input(s): "DDIMER" in the last 168 hours.   Radiology    No results found.   Cardiac Studies   Reviewed recent echocardiogram  Patient Profile     83 y.o. male history of HTN, HLD, CAD, history of PE, CKD stage 3b, SVT, WPW, COPD, HFpEF, OSA on CPAP, RCC, liver cirrhosis, recent SAH s/p fall. He presented to the [facility] ED on 06/21/2023 complaining of leg swelling, weakness, dizziness, increased shortness of breath.   Assessment & Plan    Acute on chronic heart failure with preserved ejection fraction Hypertension History of VT with loss of exacerbation, status post complete ablation of left  lateral accessory pathway CKD stage 3b Bacterial PNA - managed by the primary team   I am highly suspicious that his ins and outs are not correct, reporting -75 mL today.  He has been on Lasix drip for the last 24 hours.  If this is correct please get bladder scan to make sure that he is not retaining.  Creatinine slightly increased today 1.74.  Will continue the Lasix drip for the as he still does look volume overloaded.  May be likely tomorrow we can stop this.  I am getting a chest x-ray to reassess.   Will have to aggressively replete his potassium, he is 3 for today.  Creatinine stable will continue to monitor this is why he is on diuretics.  Blood pressure is at target today  In sinus rhythm, please continue with her current verapamil dose.    We will continue to follow with you.     For questions or updates, please contact [facility] Please consult [email or web address] for contact info under Cardiology/STEMI.      SignedThomasene Ripple, DO  06/27/2023, 9:49 AM

## 2023-06-27 NOTE — Plan of Care (Signed)
  Problem: Education: Goal: Knowledge of General Education information will improve Description: Including pain rating scale, medication(s)/side effects and non-pharmacologic comfort measures Outcome: Progressing   Problem: Clinical Measurements: Goal: Will remain free from infection Outcome: Progressing   Problem: Clinical Measurements: Goal: Respiratory complications will improve Outcome: Progressing   Problem: Clinical Measurements: Goal: Diagnostic test results will improve Outcome: Progressing

## 2023-06-28 ENCOUNTER — Inpatient Hospital Stay (HOSPITAL_COMMUNITY)

## 2023-06-28 DIAGNOSIS — C649 Malignant neoplasm of unspecified kidney, except renal pelvis: Secondary | ICD-10-CM | POA: Diagnosis not present

## 2023-06-28 DIAGNOSIS — Z515 Encounter for palliative care: Secondary | ICD-10-CM | POA: Diagnosis not present

## 2023-06-28 DIAGNOSIS — K7460 Unspecified cirrhosis of liver: Secondary | ICD-10-CM

## 2023-06-28 DIAGNOSIS — Z7189 Other specified counseling: Secondary | ICD-10-CM | POA: Diagnosis not present

## 2023-06-28 DIAGNOSIS — R531 Weakness: Secondary | ICD-10-CM | POA: Diagnosis not present

## 2023-06-28 DIAGNOSIS — R180 Malignant ascites: Secondary | ICD-10-CM | POA: Diagnosis not present

## 2023-06-28 DIAGNOSIS — I10 Essential (primary) hypertension: Secondary | ICD-10-CM | POA: Diagnosis not present

## 2023-06-28 DIAGNOSIS — I509 Heart failure, unspecified: Secondary | ICD-10-CM | POA: Diagnosis not present

## 2023-06-28 DIAGNOSIS — I5033 Acute on chronic diastolic (congestive) heart failure: Secondary | ICD-10-CM | POA: Diagnosis not present

## 2023-06-28 DIAGNOSIS — E876 Hypokalemia: Secondary | ICD-10-CM | POA: Diagnosis not present

## 2023-06-28 HISTORY — PX: IR PARACENTESIS: IMG2679

## 2023-06-28 LAB — HEPATIC FUNCTION PANEL
ALT: 30 U/L (ref 0–44)
AST: 51 U/L — ABNORMAL HIGH (ref 15–41)
Albumin: 2.7 g/dL — ABNORMAL LOW (ref 3.5–5.0)
Alkaline Phosphatase: 198 U/L — ABNORMAL HIGH (ref 38–126)
Bilirubin, Direct: 0.2 mg/dL (ref 0.0–0.2)
Indirect Bilirubin: 0.3 mg/dL (ref 0.3–0.9)
Total Bilirubin: 0.5 mg/dL (ref 0.0–1.2)
Total Protein: 6.2 g/dL — ABNORMAL LOW (ref 6.5–8.1)

## 2023-06-28 LAB — BODY FLUID CELL COUNT WITH DIFFERENTIAL
Eos, Fluid: 0 %
Lymphs, Fluid: 67 %
Monocyte-Macrophage-Serous Fluid: 28 % — ABNORMAL LOW (ref 50–90)
Neutrophil Count, Fluid: 5 % (ref 0–25)
Total Nucleated Cell Count, Fluid: 142 uL (ref 0–1000)

## 2023-06-28 LAB — PROTEIN, PLEURAL OR PERITONEAL FLUID: Total protein, fluid: <3 g/dL

## 2023-06-28 LAB — BASIC METABOLIC PANEL
Anion gap: 8 (ref 5–15)
BUN: 34 mg/dL — ABNORMAL HIGH (ref 8–23)
CO2: 26 mmol/L (ref 22–32)
Calcium: 7.9 mg/dL — ABNORMAL LOW (ref 8.9–10.3)
Chloride: 102 mmol/L (ref 98–111)
Creatinine, Ser: 1.95 mg/dL — ABNORMAL HIGH (ref 0.61–1.24)
GFR, Estimated: 34 mL/min — ABNORMAL LOW (ref >60)
Glucose, Bld: 104 mg/dL — ABNORMAL HIGH (ref 70–99)
Potassium: 4.8 mmol/L (ref 3.5–5.1)
Sodium: 136 mmol/L (ref 135–145)

## 2023-06-28 LAB — GRAM STAIN

## 2023-06-28 LAB — GLUCOSE, PLEURAL OR PERITONEAL FLUID: Glucose, Fluid: 115 mg/dL

## 2023-06-28 LAB — LACTATE DEHYDROGENASE, PLEURAL OR PERITONEAL FLUID: LD, Fluid: 32 U/L — ABNORMAL HIGH (ref 3–23)

## 2023-06-28 MED ORDER — ALBUMIN HUMAN 25 % IV SOLN
12.5000 g | Freq: Four times a day (QID) | INTRAVENOUS | Status: AC
  Administered 2023-06-28 – 2023-06-29 (×2): 12.5 g via INTRAVENOUS
  Filled 2023-06-28 (×2): qty 50

## 2023-06-28 MED ORDER — METOLAZONE 2.5 MG PO TABS
2.5000 mg | ORAL_TABLET | Freq: Once | ORAL | Status: AC
  Administered 2023-06-28: 2.5 mg via ORAL
  Filled 2023-06-28: qty 1

## 2023-06-28 MED ORDER — SPIRONOLACTONE 25 MG PO TABS
25.0000 mg | ORAL_TABLET | Freq: Every day | ORAL | Status: DC
  Administered 2023-06-28 – 2023-07-03 (×6): 25 mg via ORAL
  Filled 2023-06-28 (×6): qty 1

## 2023-06-28 MED ORDER — FUROSEMIDE 10 MG/ML IJ SOLN
12.0000 mg/h | INTRAVENOUS | Status: DC
  Administered 2023-06-28 – 2023-06-29 (×3): 12 mg/h via INTRAVENOUS
  Filled 2023-06-28 (×3): qty 20

## 2023-06-28 MED ORDER — LIDOCAINE HCL 1 % IJ SOLN
20.0000 mL | Freq: Once | INTRAMUSCULAR | Status: AC
  Administered 2023-06-28: 10 mL

## 2023-06-28 NOTE — Plan of Care (Signed)

## 2023-06-28 NOTE — Consult Note (Signed)
 Consultation Note Date: 06/28/2023   Patient Name: Chad Hogan  DOB: 1940/11/30  MRN: [id number]  Age / Sex: 83 y.o., male  PCP: Street, [name], MD Referring Physician: Marcelino Duster, MD  Reason for Consultation: Establishing goals of care  HPI/Patient Profile: 83 y.o. male   admitted on 06/21/2023 with past medical history of HTN, HLD, CAD, history of PE, CKD stage 3b, liver cirrhosis, history of SVT, WPW, COPD, HFpEF, OSA on CPAP, RCC/diagnosed in 2017.  Patient has had continued physical and functional decline over the past many months.  Patient and his family face treatment option decisions, advanced directive decisions and anticipatory care needs.   Clinical Assessment and Goals of Care:  This NP Lorinda Creed reviewed medical records, received report from team, assessed the patient and then meet at the patient's bedside  to discuss diagnosis, prognosis, GOC, EOL wishes disposition and options.   Concept of Palliative Care was introduced as specialized medical care for people and their families living with serious illness.  If focuses on providing relief from the symptoms and stress of a serious illness.  The goal is to improve quality of life for both the patient and the family. Values and goals of care important to patient and family were attempted to be elicited.  Initially I met with Chad Hogan alone at his bedside..   Education offered on his multiple comorbidities and the seriousness of his current medical situation.  Chad Hogan had many questions and verbalized little insight into his medical conditions.   Later in the afternoon I was able to meet at the bedside with Chad Hogan and his daughter  /Chad Hogan.    Detailed education regarding multiple co-morbidities; history of renal cell carcinoma, cryptogenic liver cirrhosis, CHF, chronic kidney disease, chronic anemia, COPD.         According  to patient's daughter, patient was made aware of renal cell carcinoma in 2017.  At that time he did not wish to pursue any treatment.  According to daughter in November 2024 patient was prepared for a hospice prognosis by nephrology.   Specifically we spoke to concepts regarding human mortality and adult failure to thrive.  Education offered on the limited medical interventions to prolong quality of life when the body does fail to thrive.    A  discussion was had today regarding advanced directives.  Concepts specific to code status, artifical feeding and hydration, continued IV antibiotics and rehospitalization was had.    The difference between a aggressive medical intervention path  and a palliative comfort care path for this patient at this time was had.     Education offered on hospice benefit; philosophy and eligibility   MOST form introduced and reviewed   Natural trajectory and expectations at EOL were discussed.  Questions and concerns addressed.  Patient  encouraged to call with questions or concerns.     PMT will continue to support holistically.  Patient is family will need ongoing support as they navigate difficult healthcare decisions.  Patient's daughter/Chad Hogan is his main support person and decision maker in the event the patient cannot speak for herself.     SUMMARY OF RECOMMENDATIONS    Code Status/Advance Care Planning: Full code Educated patient/family to consider DNR/DNI status understanding evidenced based poor outcomes in similar hospitalized patient, as the cause of arrest is likely associated with advanced chronic illness rather than an easily reversible acute cardio-pulmonary event.    Palliative Prophylaxis:  Aspiration, Bowel Regimen, Delirium Protocol, and Frequent Pain Assessment  Additional Recommendations (Limitations, Scope, Preferences): Full Scope Treatment  Psycho-social/Spiritual:  Desire for further Chaplaincy  support:no Additional Recommendations: Education on Hospice  Prognosis:  < 6 months  Discharge Planning: To Be Determined      Primary Diagnoses: Present on Admission:  Morbid obesity (HCC)  Essential hypertension   I have reviewed the medical record, interviewed the patient and family, and examined the patient. The following aspects are pertinent.  Past Medical History:  Diagnosis Date   Anxiety    CAP (community acquired pneumonia) 12/2016   CKD (chronic kidney disease) stage 3, GFR 30-59 ml/min (HCC)    COPD (chronic obstructive pulmonary disease) (HCC)    Diastolic CHF, chronic (HCC)    HLD (hyperlipidemia)    HTN (hypertension)    Ischemic heart disease    Obesity    OSA (obstructive sleep apnea)    Palpitation    Pulmonary embolism (HCC)    PVC (premature ventricular contraction)    SVT (supraventricular tachycardia) (HCC)    Umbilical hernia    WPW (Wolff-Parkinson-White syndrome)    Social History   Socioeconomic History   Marital status: Divorced    Spouse name: Not on file   Number of children: 2   Years of education: 12   Highest education level: Not on file  Occupational History   Occupation: retired     Associate Professor: [employer]  Tobacco Use   Smoking status: Former   Smokeless tobacco: Current    Types: Chew   Tobacco comments:    chews once a day  Vaping Use   Vaping status: Never Used  Substance and Sexual Activity   Alcohol use: No   Drug use: No   Sexual activity: Not Currently  Other Topics Concern   Not on file  Social History Narrative   Not on file   Social Drivers of Health   Financial Resource Strain: Not on file  Food Insecurity: No Food Insecurity (06/22/2023)   Hunger Vital Sign    Worried About Running Out of Food in the Last Year: Never true    Ran Out of Food in the Last Year: Never true  Transportation Needs: No Transportation Needs (06/22/2023)   PRAPARE - Administrator, Civil Service (Medical): No    Lack  of Transportation (Non-Medical): No  Physical Activity: Not on file  Stress: Not on file  Social Connections: Socially Isolated (06/22/2023)   Social Connection and Isolation Panel [NHANES]    Frequency of Communication with Friends and Family: Three times a week    Frequency of Social Gatherings with Friends and Family: More than three times a week    Attends Religious Services: Never    Database administrator or Organizations: No    Attends Banker Meetings: Never    Marital Status: Divorced   Family History  Problem Relation Age of Onset   CVA Mother    Cancer Father        Loreli Slot PRIMARY  Liver cancer Other    Heart disease Other    Scheduled Meds:  azithromycin  500 mg Oral Daily   docusate sodium  100 mg Oral BID   enoxaparin (LOVENOX) injection  40 mg Subcutaneous Q24H   levothyroxine  25 mcg Oral Daily   metolazone  2.5 mg Oral Once   mometasone-formoterol  2 puff Inhalation BID   pantoprazole  40 mg Oral q morning   polyethylene glycol  17 g Oral Daily   senna  1 tablet Oral QHS   spironolactone  25 mg Oral Daily   verapamil  240 mg Oral BID   Continuous Infusions:  cefTRIAXone (ROCEPHIN)  IV 2 g (06/27/23 1721)   furosemide (LASIX) 200 mg in dextrose 5 % 100 mL (2 mg/mL) infusion 8 mg/hr (06/27/23 1713)   PRN Meds:.acetaminophen **OR** acetaminophen, ALPRAZolam, guaiFENesin, ipratropium-albuterol, ondansetron **OR** ondansetron (ZOFRAN) IV, oxyCODONE Medications Prior to Admission:  Prior to Admission medications   Medication Sig Start Date End Date Taking? Authorizing Provider  ALPRAZolam Prudy Feeler) 1 MG tablet Take 1 tablet (1 mg total) by mouth 2 (two) times daily as needed for anxiety. 12/30/16  Yes Lonia Blood, MD  budesonide (PULMICORT) 0.5 MG/2ML nebulizer solution Take 0.5 mg by nebulization in the morning and at bedtime. 05/27/23  Yes [provider]  cholecalciferol (VITAMIN D3) 25 MCG (1000 UNIT) tablet Take 1,000 Units by mouth  daily.   Yes [provider]  ipratropium-albuterol (DUONEB) 0.5-2.5 (3) MG/3ML SOLN Take 3 mLs by nebulization every 6 (six) hours as needed (sob). 05/14/23  Yes [provider]  levothyroxine (SYNTHROID) 25 MCG tablet Take 25 mcg by mouth daily. 05/21/23  Yes [provider]  pantoprazole (PROTONIX) 40 MG tablet Take 40 mg by mouth every morning. 05/14/23  Yes [provider]  potassium chloride SA (K-DUR,KLOR-CON) 20 MEQ tablet Take 2 tablets (40 mEq total) by mouth daily. Patient taking differently: Take 20 mEq by mouth 2 (two) times daily. 01/01/17  Yes Drema Dallas, MD  rosuvastatin (CRESTOR) 10 MG tablet Take 1 tablet (10 mg total) by mouth at bedtime. 01/01/17  Yes Drema Dallas, MD  torsemide (DEMADEX) 20 MG tablet Take 2 tablets twice daily for 5 days, then decrease to 2 tablets once daily (40mg  total) thereafter Patient taking differently: Take 20 mg by mouth 2 (two) times daily. 11/11/22  Yes [name], MD  verapamil (CALAN-SR) 240 MG CR tablet Take 1 tablet (240 mg total) by mouth 2 (two) times daily. 12/30/22  Yes [name], MD  fluticasone-salmeterol (ADVAIR) 250-50 MCG/ACT AEPB Inhale 1 puff into the lungs 2 (two) times daily. Patient not taking: Reported on 06/22/2023 09/30/21   [provider]  levETIRAcetam (KEPPRA) 500 MG tablet Take by mouth. Patient not taking: Reported on 06/22/2023 04/27/23   [provider]  OVER THE COUNTER MEDICATION Inhale 1 application into the lungs at bedtime. CPAP with O2    [provider]   Allergies  Allergen Reactions   Eliquis [Apixaban] Other (See Comments)    Bleeding event   Codeine Other (See Comments)    Cant remember   Review of Systems  Physical Exam  Vital Signs: BP 129/62   Pulse 74   Temp 98.3 F (36.8 C) (Oral)   Resp 20   Ht 5\' 11"  (1.803 m)   Wt 117.7 kg   SpO2 93%   BMI 36.19 kg/m  Pain Scale: 0-10   Pain Score: 0-No pain  SpO2: SpO2: 93  % O2 Device:SpO2: 93 % O2 Flow Rate: .O2 Flow Rate (L/min): 3 L/min  IO: Intake/output summary:  Intake/Output Summary (Last 24 hours) at 06/28/2023 1025 Last data filed at 06/28/2023 0851 Gross per 24 hour  Intake 620 ml  Output 700 ml  Net -80 ml    LBM: Last BM Date : 06/27/23 Baseline Weight: Weight: 115.7 kg Most recent weight: Weight: 117.7 kg     Palliative Assessment/Data:  40 %     Time:  90 minutes   Signed by: Lorinda Creed, NP   Please contact Palliative Medicine Team phone at [phone] for questions and concerns.  For individual provider: See Loretha Stapler

## 2023-06-28 NOTE — TOC Progression Note (Signed)
 Transition of Care [facility]) - Progression Note    Patient Details  Name: Chad Hogan MRN: [id number] Date of Birth: April 09, 1941  [facility] CM/SW Contact  Leone Haven, RN Phone Number: 06/28/2023, 12:32 PM  Clinical Narrative:    Information from progression ,conts on lasix drip,, for u/s to see if will be able to do paracentesis.  TOC following.   Expected Discharge Plan: Home w Home Health Services Barriers to Discharge: Continued Medical Work up  Expected Discharge Plan and Services In-house Referral: Clinical Social Work Discharge Planning Services: CM Consult Post Acute Care Choice: Home Health Living arrangements for the past 2 months: Single Family Home                 DME Arranged: N/A DME Agency: NA       HH Arranged: RN, PT, OT, Nurse's Aide HH Agency: [facility] Date [facility] Agency Contacted: 06/25/23 Time HH Agency Contacted: 1238 Representative spoke with at [facility] Agency: Dewayne Hatch   Social Determinants of Health (SDOH) Interventions SDOH Screenings   Food Insecurity: No Food Insecurity (06/22/2023)  Housing: Low Risk  (06/22/2023)  Transportation Needs: No Transportation Needs (06/22/2023)  Utilities: Not At Risk (06/22/2023)  Depression (PHQ2-9): Low Risk  (04/20/2023)  Social Connections: Socially Isolated (06/22/2023)  Tobacco Use: High Risk (06/22/2023)    Readmission Risk Interventions    06/25/2023   12:29 PM  Readmission Risk Prevention Plan  Transportation Screening Complete  PCP or Specialist Appt within 5-7 Days Complete  Home Care Screening Complete  Medication Review (RN CM) Complete

## 2023-06-28 NOTE — Procedures (Signed)
 PROCEDURE SUMMARY:  Successful US guided paracentesis from left lateral abdomen.  Yielded 5.0 liters of yellow fluid.  No immediate complications.  Pt tolerated well.   Specimen was sent for labs.  EBL < 5mL  Hoyt Koch PA-C 06/28/2023 3:01 PM

## 2023-06-28 NOTE — Progress Notes (Signed)
 Progress Note   Patient: Chad Hogan ZOX:[id number] DOB: [birth year] DOA: 06/21/2023     6 DOS: the patient was seen and examined on 06/28/2023   Brief hospital course: 52 yom w/ HTN HLD, CAD, PE, CKD stage IIIb who presented to ED w/ complaints of dizziness tiredness and lightheadedness  and difficulty ambulating and shortness of breath and progressively worsening swelling in his legs and abdomen over the last week , treated at his nursing home for recurrent pneumonia with multiple rounds of antibiotics without improvement. In the ED hemodynamically stable afebrile labs reviewed normal wbc, somewhat elevated BNP 291 creatinine 1.6 AST 54 ALP 102 troponin 11>9 9, chest x-ray left-sided pleural effusion and opacities as well as pulmonary venous congestion and patient not admitted for further workup, started on IV antibiotics given Lasix 40 mg.  Consultation: Cardiology  Assessment and Plan: Acute on chronic CHF with preserved EF Hypertension Acute on chronic hypoxic respiratory failure He is on baseline on 3 L nasal cannula.  TTE-shows EF 55% with G1 DD has RV pressure/volume overload with D-shaped septum. Cardiology follow up appreciated advised palliative consult, paracentesis, continue Lasix drip and monitor daily weights, strict I/O, monitor potassium and replace accordingly.  Net IO Since Admission: - if correct.  Bacterial pneumonia: Recently treated with multiple rounds of antibiotics at nursing facility.  He had no leukocytosis and procalcitonin less than 0.17. Stopped rocephin and azithro after 5 day therapy.   RCC history: Continue to follow-up outpatient   Cryptogenic liver cirrhosis: Monitor volume status continue Lasix drip as above. IR paracentesis ordered. 5L fluid removed. I will order albumin infusion.   Acute on CKD stage IIIb: Renal function worsening on Lasix gtt.  Continue to monitor daily renal function. Avoid nephrotoxic  drugs.  Hypokalemia: Resolved. Monitor daily electrolytes and replace accordingly.   Hypertension History of SVT/WPW syndrome HLD: BP is well-controlled..Continue diuretics and verapamil.  Statin on hold   Chronic anemia Thrombocytopenia : This finding is likely from liver cirrhosis. Continue to monitor.   COPD with chronic respiratory failure: Not in exacerbation. Cont home Dulera   Anxiety: Mood stable continue home meds   Class I Obesity: Estimated body mass index is 35.67 kg/m as calculated. Diet, weight reduction advised.   Deconditioning debility: PT/ OT follow up. Home health to be arranged prior to discharge.  Goals of care: Patient is weak and frail high risk of readmission complex co-morbidities.  Palliative care evaluation for goals of care as he is not improving with IV diuresis.     Out of bed to chair. Incentive spirometry. Nursing supportive care. Fall, aspiration precautions. Diet:  Diet Orders (From admission, onward)     Start     Ordered   06/22/23 0755  Diet Heart Room service appropriate? Yes; Fluid consistency: Thin; Fluid restriction: 1500 mL Fluid  Diet effective now       Question Answer Comment  Room service appropriate? Yes   Fluid consistency: Thin   Fluid restriction: 1500 mL Fluid      06/22/23 0754           DVT prophylaxis: enoxaparin (LOVENOX) injection 40 mg Start: 06/22/23 1000 SCDs Start: 06/22/23 0013  Level of care: cardiac tele   Code Status: Full Code  Subjective: Patient is seen and examined today morning. He is weak, did not get out of bed. RN notified that he is retaining. Did not get out of bed.   Physical Exam: Vitals:   06/28/23 8119 06/28/23 0814 06/28/23 1124  06/28/23 1632  BP: 120/68 129/62 125/63 119/60  Pulse: 74  74 74  Resp: 20  (!) 21 20  Temp: 98.3 F (36.8 C)  97.8 F (36.6 C) 97.9 F (36.6 C)  TempSrc: Oral  Oral Oral  SpO2: 93%  93% 95%  Weight:      Height:        General -  Elderly Caucasian ill male, no apparent distress HEENT - PERRLA, EOMI, atraumatic head, non tender sinuses. Lung - Clear, diffuse rales, rhonchi, no wheezes. Heart - S1, S2 heard, no murmurs, rubs, 2+ anasarca, pedal edema. Abdomen - Soft, non tender, distended, fluid thrill noted, bowel sounds good Neuro - Alert, awake and oriented x 3, non focal exam. Skin - Warm and dry.  Data Reviewed:      Latest Ref Rng & Units 06/27/2023    2:44 AM 06/26/2023    3:07 AM 06/25/2023    2:26 AM  CBC  WBC 4.0 - 10.5 K/uL 6.5  6.2  5.7   Hemoglobin 13.0 - 17.0 g/dL 56.2  13.0  86.5   Hematocrit 39.0 - 52.0 % 35.9  34.3  34.6   Platelets 150 - 400 K/uL 128  119  106       Latest Ref Rng & Units 06/28/2023    3:09 AM 06/27/2023    2:44 AM 06/26/2023    3:07 AM  BMP  Glucose 70 - 99 mg/dL 784  94  93   BUN 8 - 23 mg/dL 34  32  28   Creatinine 0.61 - 1.24 mg/dL 6.96  2.95  2.84   Sodium 135 - 145 mmol/L 136  137  136   Potassium 3.5 - 5.1 mmol/L 4.8  4.3  3.4   Chloride 98 - 111 mmol/L 102  102  101   CO2 22 - 32 mmol/L 26  26  25    Calcium 8.9 - 10.3 mg/dL 7.9  8.0  7.7    DG CHEST PORT 1 VIEW Result Date: 06/27/2023 CLINICAL DATA:  Short of breath. EXAM: PORTABLE CHEST 1 VIEW COMPARISON:  06/22/2023.  CT, 12/20/2016. FINDINGS: Cardiac silhouette normal in size.  No mediastinal or hilar masses. Bilateral interstitial thickening. Additional opacity at the left lung base obscures hemidiaphragm. Mild hazy airspace opacity noted in the right upper lower lung and left mid lung. Suspect small pleural effusion on the left. No pneumothorax. Skeletal structures are demineralized, but grossly intact. IMPRESSION: 1. Bilateral interstitial thickening and hazy airspace opacities suspicious for pulmonary edema. 2. Left lower lung opacity consistent with atelectasis and/or pneumonia with a probable small pleural effusion. Electronically Signed   By: Amie Portland M.D.   On: 06/27/2023 13:56    Family  Communication: Discussed with patient, he understand and agree. All questions answered.  Disposition: Status is: Inpatient Remains inpatient appropriate because: IV diuresis, weight. I/O monitoring.  Planned Discharge Destination: Home with Home Health     MDM level 3- patent has significant fluid overload, on IV lasix drip. He will need close hemodynamic, telemetry, electrolyte monitoring. Overall prognosis poor.  Patient is at high risk for sudden clinical deterioration.  Author: Marcelino Duster, MD 06/28/2023 5:33 PM Secure chat 7am to 7pm For on call review [email or web address].

## 2023-06-28 NOTE — Progress Notes (Signed)
 Heart Failure Navigator Progress Note  Assessed for Heart & Vascular TOC clinic readiness.  Patient does not meet criteria due to EF 55%, per MD note . progressive renal cell carcinoma with metastases, palliative care consulted. No HF TOC .  Navigator will sign off at this time.   Rhae Hammock, BSN, Scientist, clinical (histocompatibility and immunogenetics) Only

## 2023-06-28 NOTE — Care Management Important Message (Signed)
 Important Message  Patient Details  Name: Chad Hogan MRN: [id number] Date of Birth: February 19, 1941   Important Message Given:  Yes - Medicare IM     Renie Ora 06/28/2023, 10:26 AM

## 2023-06-28 NOTE — Progress Notes (Signed)
 Physical Therapy Treatment Patient Details Name: Chad Hogan MRN: [id number] DOB: 29-May-1940 Today's Date: 06/28/2023   History of Present Illness 83 y.o. male admitted 3/10 with fatigue, LB edema, SOB with acute on chronic HFpEF. PMhx: HTN, HLD, CAD, PE, CKD, COPD (3L O2 at baseline), OSA, Wolff-Parkinson-white syndrome, liver cirrhosis    PT Comments  Pt greeted supine in bed, daughter present in room, pleasant and agreeable to PT session. Pt's primarily limiting factor is activity tolerance. He quickly fatigues and requires a prolonged seated rest break between mobility attempts. Pt continues to require 6L O2 via [place] during activity to maintain SpO2 >88%. He completed bed<>chair transfer and ~30ft of forward/backward stepping using RW with CGA. Instructed pt on two BLE exercises he could perform in the chair to advance strength. Given the limited functional mobility progress made at this point pt will benefit from continued inpatient follow up therapy, <3 hours/day for energy conservation techniques, increased strength, improve cardiopulmonary endurance, and to maximize independence with functional mobility and safety prior to discharge home.    If plan is discharge home, recommend the following: A little help with walking and/or transfers;Help with stairs or ramp for entrance;Assistance with cooking/housework;A little help with bathing/dressing/bathroom;Assist for transportation   Can travel by private vehicle     Yes  Equipment Recommendations  None recommended by PT    Recommendations for Other Services OT consult     Precautions / Restrictions Precautions Precautions: Fall Recall of Precautions/Restrictions: Intact Restrictions Weight Bearing Restrictions Per Provider Order: No     Mobility  Bed Mobility Overal bed mobility: Needs Assistance Bed Mobility: Supine to Sit, Sit to Supine     Supine to sit: HOB elevated, Used rails, Min assist Sit to supine: HOB elevated, Contact  guard assist   General bed mobility comments: Pt sat up on R side of bed with HOB elevated ~20deg and use of bedrail. CGA at trunk and minA at bedpad to scoot fwd til feet supported. Returning to supine pt required CGA at BLE to bring back into bed. Repositioned his trunk using bedrails.    Transfers Overall transfer level: Needs assistance Equipment used: Rolling walker (2 wheels) Transfers: Sit to/from Stand, Bed to chair/wheelchair/BSC Sit to Stand: Contact guard assist   Step pivot transfers: Contact guard assist       General transfer comment: Pt stood from lowest bed height, pushing up with BUE from surface and rising to RW. Pt completed bed<>chair transfer using RW. He takes increased time to scoot fwd and prepare to power up, CGA and VC for sequencing. Good eccentric control.    Ambulation/Gait Ambulation/Gait assistance: Contact guard assist Gait Distance (Feet): 5 Feet Assistive device: Rolling walker (2 wheels) Gait Pattern/deviations: Step-to pattern, Shuffle Gait velocity: reduced Gait velocity interpretation: <1.31 ft/sec, indicative of household ambulator   General Gait Details: Pt took fwd/bkwd steps in front of recliner chair without floor clearence. He quickly fatigued and reported BLE weakness. Pt maintained fixed gaze on floor and fwd flex posture over RW.   Stairs             Wheelchair Mobility     Tilt Bed    Modified Rankin (Stroke Patients Only)       Balance Overall balance assessment: Needs assistance Sitting-balance support: Bilateral upper extremity supported, Feet supported Sitting balance-Leahy Scale: Fair Sitting balance - Comments: Pt sat EOB with supervision.   Standing balance support: Bilateral upper extremity supported, During functional activity, Reliant on assistive device for balance  Standing balance-Leahy Scale: Poor Standing balance comment: Pt dependent on RW.                            Communication  Communication Communication: Impaired Factors Affecting Communication: Hearing impaired  Cognition Arousal: Alert Behavior During Therapy: WFL for tasks assessed/performed   PT - Cognitive impairments: No apparent impairments                       PT - Cognition Comments: Pt took increased time to complete functional mobility. Following commands: Intact      Cueing Cueing Techniques: Verbal cues, Tactile cues, Gestural cues  Exercises General Exercises - Lower Extremity Long Arc Quad: Seated, Both, 5 reps, Strengthening (hold for 5 seconds at top.) Hip Flexion/Marching: Seated, Both, 5 reps, Strengthening    General Comments General comments (skin integrity, edema, etc.): Pt greeted on 3L O2, SpO2 87%. Began bumping up and cued PLB technique. He required 6L during mobility for SpO2 >88%. Pt c/o "fuzzyhead" feeling upon sitting up, BP was stable. He reported the feeling lessened the longer he sat up for. Encouraged him to maintain seated posture for ~2hrs and to transfer to eat each meal sitting upright.      Pertinent Vitals/Pain Pain Assessment Pain Assessment: No/denies pain    Home Living                          Prior Function            PT Goals (current goals can now be found in the care plan section) Acute Rehab PT Goals Patient Stated Goal: Get Stronger Progress towards PT goals: Progressing toward goals    Frequency    Min 2X/week      PT Plan      Co-evaluation              AM-PAC PT "6 Clicks" Mobility   Outcome Measure  Help needed turning from your back to your side while in a flat bed without using bedrails?: A Lot Help needed moving from lying on your back to sitting on the side of a flat bed without using bedrails?: A Lot Help needed moving to and from a bed to a chair (including a wheelchair)?: A Little Help needed standing up from a chair using your arms (e.g., wheelchair or bedside chair)?: A Little Help needed to  walk in hospital room?: A Lot Help needed climbing 3-5 steps with a railing? : A Lot 6 Click Score: 14    End of Session Equipment Utilized During Treatment: Gait belt;Oxygen Activity Tolerance: Patient tolerated treatment well;Patient limited by fatigue Patient left: in bed;Other (comment) (with transport present) Nurse Communication: Mobility status;Other (comment) (Pt's desaturation and increased O2 needs) PT Visit Diagnosis: History of falling (Z91.81);Muscle weakness (generalized) (M62.81);Unsteadiness on feet (R26.81);Difficulty in walking, not elsewhere classified (R26.2)     Time: 6440-3474 PT Time Calculation (min) (ACUTE ONLY): 33 min  Charges:    $Therapeutic Exercise: 8-22 mins $Therapeutic Activity: 8-22 mins PT General Charges $$ ACUTE PT VISIT: 1 Visit                     Cheri Guppy, PT, DPT Acute Rehabilitation Services Office: [phone] Secure Chat Preferred  Richardson Chiquito 06/28/2023, 3:58 PM

## 2023-06-28 NOTE — Progress Notes (Signed)
 Patient Name: Chad Hogan Date of Encounter: 06/28/2023 [facility] Cardiologist: Sherryl Manges, MD   Interval Summary  .    Continues to have very poor diuresis, overtly volume up.  Pursed lips with difficulty breathing on supplemental oxygen.  Only 1 L of output in the last 24 hours.  Short of breath.  Vital Signs .    Vitals:   06/28/23 0020 06/28/23 0429 06/28/23 0722 06/28/23 0814  BP: 107/61 120/66 120/68 129/62  Pulse: 72 72 74   Resp: 20 18 20    Temp: 97.7 F (36.5 C) 98.1 F (36.7 C) 98.3 F (36.8 C)   TempSrc: Oral Oral Oral   SpO2: 94% 93% 93%   Weight:  117.7 kg    Height:        Intake/Output Summary (Last 24 hours) at 06/28/2023 0950 Last data filed at 06/28/2023 0851 Gross per 24 hour  Intake 620 ml  Output 925 ml  Net -305 ml      06/28/2023    4:29 AM 06/27/2023    5:15 AM 06/26/2023    4:46 AM  Last 3 Weights  Weight (lbs) 259 lb 8 oz 255 lb 15.3 oz 255 lb 11.7 oz  Weight (kg) 117.708 kg 116.1 kg 116 kg      Telemetry/ECG    Sinus rhythm heart rates in 70s- Personally Reviewed  CV Studies    Echocardiogram 06/22/2023 1. Left ventricular ejection fraction, by estimation, is 55%. The left  ventricle has normal function. Left ventricular endocardial border not  optimally defined to evaluate regional wall motion. Left ventricular  diastolic parameters are consistent with  Grade I diastolic dysfunction (impaired relaxation).   2. D-shaped septum suggests a degree of RV pressure/volume overload.  Right ventricular systolic function is normal. The right ventricular size  is moderately enlarged. Tricuspid regurgitation signal is inadequate for  assessing PA pressure.   3. The mitral valve was not well visualized. No evidence of mitral valve  regurgitation. No evidence of mitral stenosis.   4. The aortic valve was not well visualized. Aortic valve regurgitation  is not visualized. No aortic stenosis is present.   5. IVC not  well-visualized.   6. Technically very difficult study. No structure was well-visualized. I  get the impression that the LV function is normal but there is a degree of  RV dysfunction.   Physical Exam .   GEN: Ill-appearing, difficulty breathing, on supplemental oxygen, pursed lips Neck: No JVD Cardiac: RRR, no murmurs, rubs, or gallops.  Respiratory: Diffuse crackles GI: Large firm distended abdomen, dullness to percussion worse on the left side MS: 2-3+ pitting edema with wraps   Patient Profile    Chad Hogan is a 83 y.o. male has hx of  HTN, HLD, CAD, history of PE, CKD stage 3b, liver cirrhosis, history of SVT, WPW, COPD, HFpEF, OSA on CPAP, RCC.  Assessment & Plan .     Acute on chronic HFpEF Liver cirrhosis RCC He has been admitted since 3/11.  Echo with preserved LVEF, D-shaped septum, RV normal function.  He is massively volume up, failing Lasix drip even at 8/h, with worsening renal function and worsening symptoms/weight.  Suspect a large portion of his poor diuresis is related to his metastatic RCC with cirrhosis/ascites.  Likely has an element of increased intrathoracic pressure causing a considerable amount of congestion.    Increase IV Lasix drip from 8-12, add metolazone 2.5 mg, adding spironolactone 25 mg as this will help  with his cirrhosis.  Need to be mindful of potassium levels.  Stopping his potassium supplement, add back as necessary.  We recommend getting abdominal ultrasound and consideration of paracentesis to assess ascites etiology/comfort. At this stage seems to have pretty progressive renal cell carcinoma with metastases, palliative care consult likely appropriate now. Getting CMP, this needs to be continued daily.  History of VT with loss of preexcitation Status post incomplete ablation of left lateral accessory pathway 03/2016 No acute arrhythmias here, continue with verapamil 240 mg twice daily  CKD Worsening renal function, creatinine almost 2  now.   Bacterial PNA Acute on chronic respiratory failure COPD OSA Liver cirrhosis RCC Primary team  For questions or updates, please contact [facility] Please consult [email or web address] for contact info under        Signed, Abagail Kitchens, PA-C

## 2023-06-29 DIAGNOSIS — I5033 Acute on chronic diastolic (congestive) heart failure: Secondary | ICD-10-CM | POA: Diagnosis not present

## 2023-06-29 DIAGNOSIS — I10 Essential (primary) hypertension: Secondary | ICD-10-CM | POA: Diagnosis not present

## 2023-06-29 DIAGNOSIS — I509 Heart failure, unspecified: Secondary | ICD-10-CM | POA: Diagnosis not present

## 2023-06-29 DIAGNOSIS — I50812 Chronic right heart failure: Secondary | ICD-10-CM

## 2023-06-29 DIAGNOSIS — C649 Malignant neoplasm of unspecified kidney, except renal pelvis: Secondary | ICD-10-CM

## 2023-06-29 DIAGNOSIS — R180 Malignant ascites: Secondary | ICD-10-CM | POA: Diagnosis not present

## 2023-06-29 DIAGNOSIS — K7460 Unspecified cirrhosis of liver: Secondary | ICD-10-CM | POA: Diagnosis not present

## 2023-06-29 DIAGNOSIS — Z66 Do not resuscitate: Secondary | ICD-10-CM

## 2023-06-29 DIAGNOSIS — R531 Weakness: Secondary | ICD-10-CM | POA: Diagnosis not present

## 2023-06-29 DIAGNOSIS — R600 Localized edema: Secondary | ICD-10-CM

## 2023-06-29 DIAGNOSIS — E876 Hypokalemia: Secondary | ICD-10-CM | POA: Diagnosis not present

## 2023-06-29 LAB — COMPREHENSIVE METABOLIC PANEL
ALT: 27 U/L (ref 0–44)
AST: 40 U/L (ref 15–41)
Albumin: 2.7 g/dL — ABNORMAL LOW (ref 3.5–5.0)
Alkaline Phosphatase: 165 U/L — ABNORMAL HIGH (ref 38–126)
Anion gap: 14 (ref 5–15)
BUN: 42 mg/dL — ABNORMAL HIGH (ref 8–23)
CO2: 25 mmol/L (ref 22–32)
Calcium: 8.1 mg/dL — ABNORMAL LOW (ref 8.9–10.3)
Chloride: 99 mmol/L (ref 98–111)
Creatinine, Ser: 2.09 mg/dL — ABNORMAL HIGH (ref 0.61–1.24)
GFR, Estimated: 31 mL/min — ABNORMAL LOW (ref >60)
Glucose, Bld: 95 mg/dL (ref 70–99)
Potassium: 4.4 mmol/L (ref 3.5–5.1)
Sodium: 138 mmol/L (ref 135–145)
Total Bilirubin: 0.6 mg/dL (ref 0.0–1.2)
Total Protein: 5.7 g/dL — ABNORMAL LOW (ref 6.5–8.1)

## 2023-06-29 LAB — PATHOLOGIST SMEAR REVIEW: Path Review: REACTIVE

## 2023-06-29 MED ORDER — CIPROFLOXACIN HCL 500 MG PO TABS
500.0000 mg | ORAL_TABLET | Freq: Every day | ORAL | Status: DC
  Administered 2023-06-29 – 2023-06-30 (×2): 500 mg via ORAL
  Filled 2023-06-29 (×2): qty 1

## 2023-06-29 NOTE — Evaluation (Signed)
 Occupational Therapy Evaluation Patient Details Name: Chad Hogan MRN: [id number] DOB: 10/12/1940 Today's Date: 06/29/2023   History of Present Illness   83 y.o. male admitted 3/10 with fatigue, LB edema, SOB with acute on chronic HFpEF. PMhx: HTN, HLD, CAD, PE, CKD, COPD (3L O2 at baseline), OSA, Wolff-Parkinson-white syndrome, liver cirrhosis     Clinical Impressions Pt presented in bed and agreeable to session. He reported feeling a little better today but still feels like his "feet are just not ready for to much walking". At this time he completed bed mobility with CGA to min assist and sit to stand transfers with CGA to min assist. He agreed to to step pivot to chair with assist to direct walker. Pt then completed UE dressing post set up. Patient will benefit from continued inpatient follow up therapy, <3 hours/day.      If plan is discharge home, recommend the following:   A lot of help with walking and/or transfers;A lot of help with bathing/dressing/bathroom;Assistance with cooking/housework;Assist for transportation;Help with stairs or ramp for entrance     Functional Status Assessment   Patient has had a recent decline in their functional status and demonstrates the ability to make significant improvements in function in a reasonable and predictable amount of time.     Equipment Recommendations    (TBD at next level of care)     Recommendations for Other Services         Precautions/Restrictions   Precautions Precautions: Fall (fall in 04/2023 with steps) Recall of Precautions/Restrictions: Intact Restrictions Weight Bearing Restrictions Per Provider Order: No     Mobility Bed Mobility Overal bed mobility: Needs Assistance Bed Mobility: Supine to Sit     Supine to sit: Contact guard, Min assist, Used rails, HOB elevated     General bed mobility comments: Pt exited to the R side    Transfers Overall transfer level: Needs assistance Equipment  used: Rolling walker (2 wheels) Transfers: Sit to/from Stand Sit to Stand: Min assist, Contact guard assist     Step pivot transfers: Contact guard assist, Min assist     General transfer comment: Pt needed increase in time to complete as he feel like his feet are not ready to walk to much yet but feeling better      Balance Overall balance assessment: Needs assistance Sitting-balance support: Feet supported Sitting balance-Leahy Scale: Fair     Standing balance support: Bilateral upper extremity supported Standing balance-Leahy Scale: Poor Standing balance comment: Pt dependent on RW.                           ADL either performed or assessed with clinical judgement   ADL Overall ADL's : Needs assistance/impaired Eating/Feeding: Independent;Sitting   Grooming: Wash/dry hands;Wash/dry face;Supervision/safety;Sitting   Upper Body Bathing: Supervision/ safety;Sitting   Lower Body Bathing: Maximal assistance;Sit to/from stand   Upper Body Dressing : Supervision/safety;Sitting   Lower Body Dressing: Maximal assistance;Sit to/from stand   Toilet Transfer: Minimal assistance   Toileting- Clothing Manipulation and Hygiene: Maximal assistance       Functional mobility during ADLs: Minimal assistance;Cueing for safety;Cueing for sequencing;Rolling walker (2 wheels)       Vision Baseline Vision/History: 0 No visual deficits Ability to See in Adequate Light: 0 Adequate Patient Visual Report: No change from baseline Vision Assessment?: Wears glasses for reading     Perception Perception: Within Functional Limits       Praxis Praxis: [facility]  Pertinent Vitals/Pain Pain Assessment Pain Assessment: No/denies pain     Extremity/Trunk Assessment Upper Extremity Assessment Upper Extremity Assessment: Generalized weakness   Lower Extremity Assessment Lower Extremity Assessment: Generalized weakness   Cervical / Trunk Assessment Cervical / Trunk  Assessment: Normal   Communication Communication Communication: Impaired Factors Affecting Communication: Hearing impaired (Pt reports decrease hearing in L ear)   Cognition Arousal: Alert Behavior During Therapy: WFL for tasks assessed/performed Cognition: No apparent impairments                               Following commands: Intact       Cueing  General Comments   Cueing Techniques: Verbal cues;Tactile cues;Gestural cues  Pt was on 3.5 L and with just sitting EOB difficulties to remain above 90% and bumped up to 4L with transfers and step pivot was abble to remain in 93-94% and nursing was made aware.   Exercises Exercises:  (Pt completed general AROM to increase in endurance with activity)   Shoulder Instructions      Home Living Family/patient expects to be discharged to:: Private residence Living Arrangements: Alone Available Help at Discharge: Family;Available 24 hours/day;Available PRN/intermittently (daughter could increase in assist and son could also assist if they make good progress) Type of Home: House Home Access: Stairs to enter [employer] of Steps: 4 Entrance Stairs-Rails: Left Home Layout: One level     Bathroom Shower/Tub: Chief Strategy Officer: Standard     Home Equipment: Grab bars - toilet;Other (comment);Rolling Walker (2 wheels) (rail on the side of the bed)   Additional Comments: Pt reports he fell in January and since then his daughter has been by to check on him everyday.      Prior Functioning/Environment Prior Level of Function : Independent/Modified Independent;History of Falls (last six months);Driving             Mobility Comments: Pt reports a couple of weeks ago he wasn't using an AD, but has lately been using the RW to move around. Reports 1 fall in the last 48mo on the stairs. ADLs Comments: Indep, retires, drives. Manages his own medications. States he takes care of household chores but  isn't too good at cooking/cleaning.    OT Problem List: Decreased activity tolerance;Impaired balance (sitting and/or standing);Decreased safety awareness;Decreased knowledge of use of DME or AE;Cardiopulmonary status limiting activity   OT Treatment/Interventions: Self-care/ADL training;Therapeutic exercise;DME and/or AE instruction;Therapeutic activities;Patient/family education;Balance training      OT Goals(Current goals can be found in the care plan section)   Acute Rehab OT Goals Patient Stated Goal: to get better OT Goal Formulation: With patient Time For Goal Achievement: 07/14/23 Potential to Achieve Goals: Good   OT Frequency:  Min 1X/week    Co-evaluation              AM-PAC OT "6 Clicks" Daily Activity     Outcome Measure Help from another person eating meals?: None Help from another person taking care of personal grooming?: A Little Help from another person toileting, which includes using toliet, bedpan, or urinal?: A Lot Help from another person bathing (including washing, rinsing, drying)?: A Lot Help from another person to put on and taking off regular upper body clothing?: A Little Help from another person to put on and taking off regular lower body clothing?: A Lot 6 Click Score: 16   End of Session Equipment Utilized During Treatment: Gait belt;Rolling walker (  2 wheels) Nurse Communication: Mobility status  Activity Tolerance: Patient tolerated treatment well Patient left: in chair;with call bell/phone within reach;with chair alarm set;with nursing/sitter in room  OT Visit Diagnosis: Unsteadiness on feet (R26.81);Other abnormalities of gait and mobility (R26.89);Repeated falls (R29.6);Muscle weakness (generalized) (M62.81)                Time: 6578-4696 OT Time Calculation (min): 33 min Charges:  OT General Charges $OT Visit: 1 Visit OT Evaluation $OT Eval Low Complexity: 1 Low OT Treatments $Self Care/Home Management : 8-22 mins  Presley Raddle OTR/L   Acute Rehab Services  [phone] office number   Alphia Moh 06/29/2023, 10:22 AM

## 2023-06-29 NOTE — Progress Notes (Signed)
 Patient ID: Chad Hogan, male   DOB: December 14, 1940, 83 y.o.   MRN: [id number]    Progress Note from the Palliative Medicine Team at [facility]   Patient Name: Chad Hogan        Date: 06/29/2023 DOB: 06/28/1940  Age: 83 y.o. MRN#: [id number] Attending Physician: Chad Duster, MD Primary Care Physician: Street, Chad Coup, MD Admit Date: 06/21/2023   Reason for Consultation/Follow-up   Establishing Goals of Care   HPI/ Brief Hospital Review  83 y.o. male   admitted on 06/21/2023 with past medical history of HTN, HLD, CAD, history of PE, CKD stage 3b, liver cirrhosis, history of SVT, WPW, COPD, HFpEF, OSA on CPAP, RCC/diagnosed in 2017. (According to daughter patient workup) S/p paracentesis yeserday  Patient has had continued physical and functional decline over the past many months.   Patient and his family face treatment option decisions, advanced directive decisions and anticipatory care needs.      Subjective  Extensive chart review has been completed prior to meeting with patient/family  including labs, vital signs, imaging, progress/consult notes, orders, medications and available advance directive documents.    This NP assessed patient at the bedside as a follow up to  yesterday's GOCs meeting. Patient is OOB to chair. No family at bedside.   He is alert and oriented and freely engages in conversation regarding GOCs.  Ongoing education regarding current medical situation. Patient understands the seriousness of his medical situation, he tells me  "I know I am dying"  He verbalizes a sense of peace, leaning on his strong faith and belief in heaven.  Education offered regarding important healthcare decisions specific to code status, artifical feeding and hydration, continued IV antibiotics and rehospitalization.    The difference between an aggressive medical intervention path  and a palliative comfort care path for this specific patient was detailed.    Education offered on hospice benefit; philosophy and eligibility.       Later in the day  I returned to the room and spoke with daughter/Chad Hogan regarding all above topics.  She understands, this is a very hard time.    Questions and concerns addressed  Anticipatory care needs are over arching.  Plan of care: -DNR/DNI-documented today -continue current medical support -PMT to meet with daughter/patient tomorrow at 1200 for further GOCs discussion   She wishes to talk with her father and the plan is to meet tomorrow with this NP for further clarification of GOC   Education offered today regarding  the importance of continued conversation with family and their  medical providers regarding overall plan of care and treatment options,  ensuring decisions are within the context of the patients values and GOCs.  Questions and concerns addressed   Discussed with primary team and nursing staff   Time: 65  minutes  Detailed review of medical records ( labs, imaging, vital signs), medically appropriate exam ( MS, skin, cardiac,  resp)   discussed with treatment team, counseling and education to patient, family, staff, documenting clinical information, medication management, coordination of care    Lorinda Creed NP  Palliative Medicine Team Team Phone # [phone] Pager [phone]

## 2023-06-29 NOTE — Plan of Care (Signed)

## 2023-06-29 NOTE — TOC Progression Note (Signed)
 Transition of Care [facility]) - Progression Note    Patient Details  Name: Chad Hogan MRN: [id number] Date of Birth: 08-Sep-1940  [facility] of [facility] CM/SW Contact  Leone Haven, RN Phone Number: 06/29/2023, 11:21 AM  Clinical Narrative:    s/p paracentesis yesterday, conts on lasix drip.  He is set up with [facility] fro HHRN, HHPT, HHOT, and HHAIDE. TOC continues to follow.    Expected Discharge Plan: Home w Home Health Services Barriers to Discharge: Continued Medical Work up  Expected Discharge Plan and Services In-house Referral: Clinical Social Work Discharge Planning Services: CM Consult Post Acute Care Choice: Home Health Living arrangements for the past 2 months: Single Family Home                 DME Arranged: N/A DME Agency: NA       HH Arranged: RN, PT, OT, Nurse's Aide HH Agency: [facility] Date [facility] Agency Contacted: 06/25/23 Time HH Agency Contacted: 1238 Representative spoke with at [facility] Agency: Dewayne Hatch   Social Determinants of Health (SDOH) Interventions SDOH Screenings   Food Insecurity: No Food Insecurity (06/22/2023)  Housing: Low Risk  (06/22/2023)  Transportation Needs: No Transportation Needs (06/22/2023)  Utilities: Not At Risk (06/22/2023)  Depression (PHQ2-9): Low Risk  (04/20/2023)  Social Connections: Socially Isolated (06/22/2023)  Tobacco Use: High Risk (06/22/2023)    Readmission Risk Interventions    06/25/2023   12:29 PM  Readmission Risk Prevention Plan  Transportation Screening Complete  PCP or Specialist Appt within 5-7 Days Complete  Home Care Screening Complete  Medication Review (RN CM) Complete

## 2023-06-29 NOTE — Progress Notes (Addendum)
 Patient Name: Chad Hogan Date of Encounter: 06/29/2023 [facility] Cardiologist: Sherryl Manges, MD   Interval Summary  .    In paracentesis almost 5 L yesterday.  Visibly does not look as tachypneic, no longer breathing with pursed lips.  Resting somewhat comfortably on supplemental oxygen.  Feels better.  Vital Signs .    Vitals:   06/29/23 0414 06/29/23 0415 06/29/23 0416 06/29/23 0417  BP: (!) 107/59     Pulse: 70 71 66 68  Resp: 19 20 17 18   Temp: 97.8 F (36.6 C)     TempSrc: Oral     SpO2: (!) 89% 92% 92% 92%  Weight: 113.8 kg     Height:        Intake/Output Summary (Last 24 hours) at 06/29/2023 0735 Last data filed at 06/29/2023 0426 Gross per 24 hour  Intake 1421.31 ml  Output 1250 ml  Net 171.31 ml      06/29/2023    4:14 AM 06/28/2023    4:29 AM 06/27/2023    5:15 AM  Last 3 Weights  Weight (lbs) 250 lb 12.8 oz 259 lb 8 oz 255 lb 15.3 oz  Weight (kg) 113.762 kg 117.708 kg 116.1 kg      Telemetry/ECG    Sinus rhythm heart rates in 70s- Personally Reviewed  CV Studies    Echocardiogram 06/22/2023 1. Left ventricular ejection fraction, by estimation, is 55%. The left  ventricle has normal function. Left ventricular endocardial border not  optimally defined to evaluate regional wall motion. Left ventricular  diastolic parameters are consistent with  Grade I diastolic dysfunction (impaired relaxation).   2. D-shaped septum suggests a degree of RV pressure/volume overload.  Right ventricular systolic function is normal. The right ventricular size  is moderately enlarged. Tricuspid regurgitation signal is inadequate for  assessing PA pressure.   3. The mitral valve was not well visualized. No evidence of mitral valve  regurgitation. No evidence of mitral stenosis.   4. The aortic valve was not well visualized. Aortic valve regurgitation  is not visualized. No aortic stenosis is present.   5. IVC not well-visualized.   6. Technically very  difficult study. No structure was well-visualized. I  get the impression that the LV function is normal but there is a degree of  RV dysfunction.   Physical Exam .   GEN: Ill-appearing, on supplemental oxygen, slight cough Neck: No JVD Cardiac: RRR, no murmurs, rubs, or gallops.  Respiratory: Diffuse crackles, improved GI: Large firm distended abdomen, dullness to percussion worse on the left side MS: 1-2+ pitting edema with wraps   Patient Profile    Chad Hogan is a 83 y.o. male has hx of  HTN, HLD, CAD, history of PE, CKD stage 3b, liver cirrhosis, history of SVT, WPW, COPD, HFpEF, OSA on CPAP, RCC.  Assessment & Plan .     Acute on chronic HFpEF Liver cirrhosis RCC -3/17 status post paracentesis 5 L, metolazone 2.5 mg once   He has been admitted since 3/11.  Echo with preserved LVEF, D-shaped septum, RV normal function.  He is massively volume up, failing Lasix drip even at 8/h, with worsening renal function and worsening symptoms/weight.  Suspect a large portion of his poor diuresis is related to his metastatic RCC with cirrhosis/ascites.  Likely has an element of increased intrathoracic pressure causing a considerable amount of congestion.     Status post paracentesis almost 5 L, fluid analysis pending.  This has significantly improved  his shortness of breath.  Although output still looks very poor 1.2L, but volume status improved. he says he's urinating more often. May need more fluid drained.  May consider going up on spironolactone if potassium continues to remain within normal limits.  Continue IV Lasix drip 12, spironolactone 25 mg as this will help with his cirrhosis.  Need to be mindful of potassium levels.  Stopped his potassium supplement, add back as necessary.  At this stage seems to have pretty progressive renal cell carcinoma with metastases, palliative care consult likely appropriate now.  History of VT with loss of preexcitation Status post incomplete ablation of  left lateral accessory pathway 03/2016 No acute arrhythmias here, continue with verapamil 240 mg twice daily  CKD Worsening renal function, creatinine 2.09 now.  Bacterial PNA Acute on chronic respiratory failure COPD OSA Liver cirrhosis RCC Primary team  For questions or updates, please contact [facility] Please consult [email or web address] for contact info under        Signed, Abagail Kitchens, PA-C

## 2023-06-29 NOTE — Progress Notes (Signed)
 Progress Note   Patient: Chad Hogan:[id number] DOB: 1940-04-21 DOA: 06/21/2023     7 DOS: the patient was seen and examined on 06/29/2023   Brief hospital course: 83 yom w/ HTN HLD, CAD, PE, CKD stage IIIb who presented to ED w/ complaints of dizziness tiredness and lightheadedness  and difficulty ambulating and shortness of breath and progressively worsening swelling in his legs and abdomen over the last week , treated at his nursing home for recurrent pneumonia with multiple rounds of antibiotics without improvement. In the ED hemodynamically stable afebrile labs reviewed normal wbc, somewhat elevated BNP 291 creatinine 1.6 AST 54 ALP 102 troponin 11>9 9, chest x-ray left-sided pleural effusion and opacities as well as pulmonary venous congestion and patient not admitted for further workup, started on IV antibiotics given Lasix 40 mg.  Consultation: Cardiology  Assessment and Plan: Acute on chronic CHF with preserved EF Hypertension Acute on chronic hypoxic respiratory failure He is on baseline on 3 L nasal cannula.  TTE-shows EF 55% with G1 DD has RV pressure/volume overload with D-shaped septum. Cardiology follow up appreciated advised palliative consult, paracentesis, continue Lasix drip and monitor daily weights, strict I/O, monitor potassium and replace accordingly.  Net IO Since Admission: - if correct. Palliative team had a prolonged discussion with him, agreed with DNR limited.  Bacterial pneumonia: Recently treated with multiple rounds of antibiotics at nursing facility.  He had no leukocytosis and procalcitonin less than 0.17. Stopped rocephin and azithro after 5 day therapy.   RCC history: Continue to follow-up outpatient   Cryptogenic liver cirrhosis: Monitor volume status continue Lasix drip as above. Status post IR paracentesis yesterday. 5L fluid removed. He got albumin infusions.   Acute on CKD stage IIIb: Renal function worsening on Lasix gtt.   Continue to monitor daily renal function.  Discussed with him regarding the same. Avoid nephrotoxic drugs.  Hypokalemia: Resolved. Monitor daily electrolytes and replace accordingly.   Hypertension History of SVT/WPW syndrome HLD: BP is well-controlled..Continue diuresis and verapamil.   Will resume statin given improved LFTs.   Chronic anemia Thrombocytopenia : Due to liver cirrhosis. Continue to monitor.   COPD with chronic respiratory failure: Not in exacerbation. Cont home Dul[facility]   Anxiety: Mood stable continue home meds   Class I Obesity: Estimated body mass index is 35.67 kg/m as calculated. Diet, weight reduction advised.   Deconditioning debility: PT/ OT follow up. Patient may need skilled nursing facility placement.  Goals of care: Patient is weak and frail high risk of readmission complex co-morbidities.  Palliative care evaluation for goals of care as he is not improving with IV diuresis.  Patient opted for DNR limited.  Wishes to continue current care.    Out of bed to chair. Incentive spirometry. Nursing supportive care. Fall, aspiration precautions. Diet:  Diet Orders (From admission, onward)     Start     Ordered   06/22/23 0755  Diet Heart Room service appropriate? Yes; Fluid consistency: Thin; Fluid restriction: 1500 mL Fluid  Diet effective now       Question Answer Comment  Room service appropriate? Yes   Fluid consistency: Thin   Fluid restriction: 1500 mL Fluid      06/22/23 0754           DVT prophylaxis: enoxaparin (LOVENOX) injection 40 mg Start: 06/22/23 1000 SCDs Start: 06/22/23 0013  Level of care: cardiac tele   Code Status: Limited: Do not attempt resuscitation (DNR) -DNR-LIMITED -Do Not Intubate/DNI   Subjective: Patient  is seen and examined today morning.  Patient is eating poor, weak did not get out of bed.  Palliative team at bedside discussing with him.  States he is sat for few hours in chair.  He got very centesis  yesterday  Physical Exam: Vitals:   06/29/23 0417 06/29/23 0813 06/29/23 1204 06/29/23 1510  BP:  110/64 111/62 (!) 110/59  Pulse: 68 73 74 81  Resp: 18 20 20  (!) 24  Temp:  97.8 F (36.6 C) 97.7 F (36.5 C) 97.9 F (36.6 C)  TempSrc:  Oral Oral Oral  SpO2: 92% 92% 94% 94%  Weight:      Height:        General - Elderly Caucasian ill male, moderate respiratory distress HEENT - PERRLA, EOMI, atraumatic head, non tender sinuses. Lung - Clear, diffuse rales, rhonchi, no wheezes. Heart - S1, S2 heard, no murmurs, rubs, 2+ anasarca, pedal edema. Abdomen - Soft, non tender, distended, fluid thrill noted, bowel sounds good Neuro - Alert, awake and oriented x 3, non focal exam. Skin - Warm and dry.  Data Reviewed:      Latest Ref Rng & Units 06/27/2023    2:44 AM 06/26/2023    3:07 AM 06/25/2023    2:26 AM  CBC  WBC 4.0 - 10.5 K/uL 6.5  6.2  5.7   Hemoglobin 13.0 - 17.0 g/dL 78.2  95.6  21.3   Hematocrit 39.0 - 52.0 % 35.9  34.3  34.6   Platelets 150 - 400 K/uL 128  119  106       Latest Ref Rng & Units 06/29/2023    3:36 AM 06/28/2023    3:09 AM 06/27/2023    2:44 AM  BMP  Glucose 70 - 99 mg/dL 95  086  94   BUN 8 - 23 mg/dL 42  34  32   Creatinine 0.61 - 1.24 mg/dL 5.78  4.69  6.29   Sodium 135 - 145 mmol/L 138  136  137   Potassium 3.5 - 5.1 mmol/L 4.4  4.8  4.3   Chloride 98 - 111 mmol/L 99  102  102   CO2 22 - 32 mmol/L 25  26  26    Calcium 8.9 - 10.3 mg/dL 8.1  7.9  8.0    IR Paracentesis Result Date: 06/28/2023 INDICATION: 83 year old male with history of HFpEF, cirrhosis who presents with abdominal distention, ascites. Request made for diagnostic and therapeutic paracentesis. EXAM: ULTRASOUND GUIDED DIAGNOSTIC AND THERAPEUTIC PARACENTESIS MEDICATIONS: 10 mL 1% lidocaine COMPLICATIONS: None immediate. PROCEDURE: Informed written consent was obtained from the patient after a discussion of the risks, benefits and alternatives to treatment. A timeout was performed prior  to the initiation of the procedure. Initial ultrasound scanning demonstrates a large amount of ascites within the left lateral abdomen. The left lateral abdomen was prepped and draped in the usual sterile fashion. 1% lidocaine was used for local anesthesia. Following this, a 19 gauge, 10-cm, Yueh catheter was introduced. An ultrasound image was saved for documentation purposes. The paracentesis was performed. The catheter was removed and a dressing was applied. The patient tolerated the procedure well without immediate post procedural complication. FINDINGS: A total of approximately 5.0 of yellow fluid was removed. Samples were sent to the laboratory as requested by the clinical team. IMPRESSION: Successful ultrasound-guided paracentesis yielding 5.0 liters of peritoneal fluid. Performed by: Loyce Dys PA-C Electronically Signed   By: Corlis Leak M.D.   On: 06/28/2023 17:42  Family Communication: Discussed with patient, he understand and agree. All questions answered.  Disposition: Status is: Inpatient Remains inpatient appropriate because: IV diuresis, weight. I/O monitoring.  Planned Discharge Destination: Home with Home Health     MDM level 3- patent has significant fluid overload, on IV lasix drip. He will need close hemodynamic, telemetry, electrolyte monitoring. Overall prognosis poor.  Patient is at high risk for sudden clinical deterioration.  Author: Marcelino Duster, MD 06/29/2023 4:30 PM Secure chat 7am to 7pm For on call review [email or web address].

## 2023-06-30 DIAGNOSIS — C649 Malignant neoplasm of unspecified kidney, except renal pelvis: Secondary | ICD-10-CM

## 2023-06-30 DIAGNOSIS — J189 Pneumonia, unspecified organism: Secondary | ICD-10-CM

## 2023-06-30 DIAGNOSIS — N179 Acute kidney failure, unspecified: Secondary | ICD-10-CM | POA: Diagnosis not present

## 2023-06-30 DIAGNOSIS — I456 Pre-excitation syndrome: Secondary | ICD-10-CM

## 2023-06-30 DIAGNOSIS — N189 Chronic kidney disease, unspecified: Secondary | ICD-10-CM

## 2023-06-30 DIAGNOSIS — Z8679 Personal history of other diseases of the circulatory system: Secondary | ICD-10-CM

## 2023-06-30 DIAGNOSIS — R531 Weakness: Secondary | ICD-10-CM | POA: Diagnosis not present

## 2023-06-30 DIAGNOSIS — E039 Hypothyroidism, unspecified: Secondary | ICD-10-CM

## 2023-06-30 DIAGNOSIS — F419 Anxiety disorder, unspecified: Secondary | ICD-10-CM

## 2023-06-30 DIAGNOSIS — R627 Adult failure to thrive: Secondary | ICD-10-CM

## 2023-06-30 DIAGNOSIS — R180 Malignant ascites: Secondary | ICD-10-CM | POA: Diagnosis not present

## 2023-06-30 DIAGNOSIS — E66811 Obesity, class 1: Secondary | ICD-10-CM

## 2023-06-30 DIAGNOSIS — I5033 Acute on chronic diastolic (congestive) heart failure: Secondary | ICD-10-CM | POA: Diagnosis not present

## 2023-06-30 DIAGNOSIS — K7460 Unspecified cirrhosis of liver: Secondary | ICD-10-CM | POA: Diagnosis not present

## 2023-06-30 DIAGNOSIS — I10 Essential (primary) hypertension: Secondary | ICD-10-CM | POA: Diagnosis not present

## 2023-06-30 DIAGNOSIS — J449 Chronic obstructive pulmonary disease, unspecified: Secondary | ICD-10-CM

## 2023-06-30 DIAGNOSIS — N1832 Chronic kidney disease, stage 3b: Secondary | ICD-10-CM

## 2023-06-30 LAB — COMPREHENSIVE METABOLIC PANEL
ALT: 27 U/L (ref 0–44)
AST: 40 U/L (ref 15–41)
Albumin: 2.6 g/dL — ABNORMAL LOW (ref 3.5–5.0)
Alkaline Phosphatase: 163 U/L — ABNORMAL HIGH (ref 38–126)
Anion gap: 11 (ref 5–15)
BUN: 47 mg/dL — ABNORMAL HIGH (ref 8–23)
CO2: 27 mmol/L (ref 22–32)
Calcium: 8.1 mg/dL — ABNORMAL LOW (ref 8.9–10.3)
Chloride: 98 mmol/L (ref 98–111)
Creatinine, Ser: 2.63 mg/dL — ABNORMAL HIGH (ref 0.61–1.24)
GFR, Estimated: 23 mL/min — ABNORMAL LOW (ref >60)
Glucose, Bld: 98 mg/dL (ref 70–99)
Potassium: 3.8 mmol/L (ref 3.5–5.1)
Sodium: 136 mmol/L (ref 135–145)
Total Bilirubin: 0.7 mg/dL (ref 0.0–1.2)
Total Protein: 5.4 g/dL — ABNORMAL LOW (ref 6.5–8.1)

## 2023-06-30 MED ORDER — ENOXAPARIN SODIUM 30 MG/0.3ML IJ SOSY
30.0000 mg | PREFILLED_SYRINGE | Freq: Every day | INTRAMUSCULAR | Status: DC
  Administered 2023-07-01 – 2023-07-07 (×7): 30 mg via SUBCUTANEOUS
  Filled 2023-06-30 (×7): qty 0.3

## 2023-06-30 MED ORDER — FUROSEMIDE 10 MG/ML IJ SOLN
80.0000 mg | Freq: Two times a day (BID) | INTRAMUSCULAR | Status: DC
  Administered 2023-06-30 – 2023-07-02 (×5): 80 mg via INTRAVENOUS
  Filled 2023-06-30 (×5): qty 8

## 2023-06-30 NOTE — Assessment & Plan Note (Addendum)
 Continue blood pressure control with verapamil.

## 2023-06-30 NOTE — Progress Notes (Signed)
 Progress Note   Patient: Chad Hogan NWG:[id number] DOB: 1941-02-16 DOA: 06/21/2023     8 DOS: the patient was seen and examined on 06/30/2023   Brief hospital course: Mr. Raper was admitted to the hospital with the working diagnosis of heart failure decompensation.   82 yom w/ HTN HLD, CAD, PE, CKD stage IIIb who presented to ED w/ complaints of dizziness tiredness and lightheadedness  and difficulty ambulating and shortness of breath and progressively worsening swelling in his legs and abdomen over the last week , treated at his nursing home for recurrent pneumonia with multiple rounds of antibiotics without improvement. In the ED hemodynamically stable afebrile labs reviewed normal wbc, somewhat elevated BNP 291 creatinine 1.6 AST 54 ALP 102 troponin 11>9 9, chest x-ray left-sided pleural effusion and opacities as well as pulmonary venous congestion and patient not admitted for further workup, started on IV antibiotics given Lasix 40 mg.   Assessment and Plan: * Acute on chronic diastolic CHF (congestive heart failure) (HCC) Echocardiogram with preserved LV systolic function with EF 55%, D shaped septum with RV pressure and volume overload, RV systolic function preserved, moderate RV enlargement,   Urine output 2,190 ml Systolic blood pressure 110 mmHg range.   Plan to continue diuresis with furosemide 80 mg IV bid Spironolactone.   Essential hypertension Continue blood pressure control with verapamil and spironolactone.   CAP (community acquired pneumonia) Patient had 5 days of appropriate antibiotic therapy.   Acute kidney injury superimposed on stage 3b chronic kidney disease (HCC) Renal function with serum cr at 2,63 with K at 3,8 and serum bicarbonate at 27  Na 136   Pl;an to continue diuresis with furosemide and spironolactone/  Will check renal function and electrolytes in am.  Avoid hypotension and nephrotoxic medications.   COPD (chronic obstructive pulmonary disease)  (HCC) No signs of acute exacerbation Continue bronchodilator therapy and supplemental 02 per [place].  Hi is on home 02 3 L/min   Obesity, class 1 Calculated BMI is 33,6   Anxiety Continue with alprazolam.   Liver cirrhosis (HCC) No signs of acute decompensation Continue heart failure therapy.   Renal cell carcinoma (HCC) Follow up as outpatient.   Hypothyroidism Continue with levothyroxine.         Subjective: Patient very weak and deconditioned, dyspnea and edema are improving. His daughter is at the bedside   Physical Exam: Vitals:   06/29/23 2123 06/30/23 0004 06/30/23 0425 06/30/23 0811  BP: (!) 106/57 119/64 112/62 128/75  Pulse:  77 74 76  Resp:  19 20 17   Temp:  97.7 F (36.5 C) (!) 97.5 F (36.4 C) (!) 97.5 F (36.4 C)  TempSrc:  Oral Oral Oral  SpO2:  93% 94% 92%  Weight:   109.5 kg   Height:       Neurology awake and alert, deconditioned and ill looking appearing ENT With mild pallor with no icterus Cardiovascular with S1 and S2 present and regular with no gallops, rubs or murmurs Respiratory with mild rales at bases with no wheezing or rhonchi Abdomen with no distention Trace lower extremity edema  Data Reviewed:    Family Communication: I spoke with patient's daughter at the bedside, we talked in detail about patient's condition, plan of care and prognosis and all questions were addressed.   Disposition: Status is: Inpatient Remains inpatient appropriate because: Diuresis   Planned Discharge Destination: Skilled nursing facility    Author: Coralie Keens, MD 06/30/2023 9:25 AM  For on call  review [email or web address].

## 2023-06-30 NOTE — Assessment & Plan Note (Signed)
 Seems calm at admission  Plan Xanax prn

## 2023-06-30 NOTE — Assessment & Plan Note (Addendum)
 CT 02/2023 with positive cirrhosis.   Worsening ascites, with portal hypertension. Poor prognosis.  03/17 paracentesis 5 L  03/21 paracentesis 10 L  plus IV albumin 75 gr   Poor prognosis.

## 2023-06-30 NOTE — Progress Notes (Signed)
 Patient Name: Chad Hogan Date of Encounter: 06/30/2023 [facility] Cardiologist: Chad Manges, MD   Interval Summary  .    Breathing better but abdomen still distended   Vital Signs .    Vitals:   06/29/23 2123 06/30/23 0004 06/30/23 0425 06/30/23 0811  BP: (!) 106/57 119/64 112/62 128/75  Pulse:  77 74 76  Resp:  19 20 17   Temp:  97.7 F (36.5 C) (!) 97.5 F (36.4 C) (!) 97.5 F (36.4 C)  TempSrc:  Oral Oral Oral  SpO2:  93% 94% 92%  Weight:   109.5 kg   Height:        Intake/Output Summary (Last 24 hours) at 06/30/2023 0853 Last data filed at 06/30/2023 7322 Gross per 24 hour  Intake 503.66 ml  Output 2450 ml  Net -1946.34 ml      06/30/2023    4:25 AM 06/29/2023    4:14 AM 06/28/2023    4:29 AM  Last 3 Weights  Weight (lbs) 241 lb 6.5 oz 250 lb 12.8 oz 259 lb 8 oz  Weight (kg) 109.5 kg 113.762 kg 117.708 kg      Telemetry/ECG    Sinus rhythm heart rates in 70s- Personally Reviewed  CV Studies    Echocardiogram 06/22/2023 1. Left ventricular ejection fraction, by estimation, is 55%. The left  ventricle has normal function. Left ventricular endocardial border not  optimally defined to evaluate regional wall motion. Left ventricular  diastolic parameters are consistent with  Grade I diastolic dysfunction (impaired relaxation).   2. D-shaped septum suggests a degree of RV pressure/volume overload.  Right ventricular systolic function is normal. The right ventricular size  is moderately enlarged. Tricuspid regurgitation signal is inadequate for  assessing PA pressure.   3. The mitral valve was not well visualized. No evidence of mitral valve  regurgitation. No evidence of mitral stenosis.   4. The aortic valve was not well visualized. Aortic valve regurgitation  is not visualized. No aortic stenosis is present.   5. IVC not well-visualized.   6. Technically very difficult study. No structure was well-visualized. I  get the impression that the  LV function is normal but there is a degree of  RV dysfunction.   Physical Exam .   GEN: Ill-appearing, on supplemental oxygen, slight cough Neck: No JVD Cardiac: RRR, no murmurs, rubs, or gallops.  Respiratory: Diffuse crackles, improved GI: Large firm distended abdomen, dullness to percussion worse on the left side MS: 1-2+ pitting edema with wraps   Patient Profile    Chad Hogan is a 83 y.o. male has hx of  HTN, HLD, CAD, history of PE, CKD stage 3b, liver cirrhosis, history of SVT, WPW, COPD, HFpEF, OSA on CPAP, RCC.  Assessment & Plan .     Acute on chronic HFpEF Liver cirrhosis RCC -3/17 status post paracentesis 5 L, metolazone 2.5 mg once   Cr worse 2.6 with K 3.8  Change to lasix 80 mg iv bid Consider repeat abdominal US and repeat paracentesis Cytology pending  2L diuresis with 5L paracentesis   History of VT with loss of preexcitation Status post incomplete ablation of left lateral accessory pathway 03/2016 No acute arrhythmias here, continue with verapamil 240 mg twice daily  CKD Worsening renal function, creatinine 2.6 now. Changing from lasix drip to bid lasix   Bacterial PNA Acute on chronic respiratory failure COPD OSA Liver cirrhosis RCC Primary team  For questions or updates, please contact [facility]  Please consult [email or web address] for contact info under        Signed, Chad Haws, MD

## 2023-06-30 NOTE — Assessment & Plan Note (Signed)
 Patient had 5 days of appropriate antibiotic therapy.

## 2023-06-30 NOTE — Assessment & Plan Note (Signed)
 Calculated BMI is 33,6

## 2023-06-30 NOTE — Progress Notes (Signed)
 Physical Therapy Treatment Patient Details Name: Chad Hogan MRN: [id number] DOB: [birth year] Today's Date: 06/30/2023   History of Present Illness 83 y.o. male admitted 3/10 with fatigue, LB edema, SOB with acute on chronic HFpEF. PMhx: HTN, HLD, CAD, PE, CKD, COPD (3L O2 at baseline), OSA, Wolff-Parkinson-white syndrome, liver cirrhosis   PT Comments  Pt engaged in two bouts of ~89ft ambulation using RW with CGA and a chair follow. He required a prolonged seated rest break between attempts and c/o dizziness although BP stable. Pt continues to desaturate with activity requiring bump up to 6L to maintain SpO2 >88%. Will continue to follow acutely and advance appropriately.    If plan is discharge home, recommend the following: Help with stairs or ramp for entrance;Assistance with cooking/housework;Assist for transportation;A lot of help with walking and/or transfers;A lot of help with bathing/dressing/bathroom   Can travel by private vehicle     Yes  Equipment Recommendations  Wheelchair (measurements PT);Wheelchair cushion (measurements PT)    Recommendations for Other Services       Precautions / Restrictions Precautions Precautions: Fall Recall of Precautions/Restrictions: Intact Restrictions Weight Bearing Restrictions Per Provider Order: No     Mobility  Bed Mobility Overal bed mobility: Needs Assistance Bed Mobility: Supine to Sit     Supine to sit: HOB elevated, Used rails, Min assist     General bed mobility comments: Pt sat up with increased time, minA to bringBLE off EOB and minA at trunk using bed pad to assit pt to sit up. Pt attempted to scoot fwd, pulled bed pad to assist feet to reach ground.    Transfers Overall transfer level: Needs assistance Equipment used: Rolling walker (2 wheels) Transfers: Sit to/from Stand Sit to Stand: From elevated surface, Contact guard assist           General transfer comment: Pt stood from raised bed height with CGA using  RW. He demonstrated preference to maintain BUE support on RW. Educated him on proper hand positioning. Pt stood from recliner chair by pushing up with BUE on armrests and minA to power up. Good eccentric control with sitting.    Ambulation/Gait Ambulation/Gait assistance: Contact guard assist, +2 safety/equipment (Chair Follow) Gait Distance (Feet): 5 Feet (x2) Assistive device: Rolling walker (2 wheels) Gait Pattern/deviations: Step-to pattern, Shuffle, Trunk flexed, Wide base of support, Decreased step length - right, Decreased step length - left Gait velocity: reduced Gait velocity interpretation: <1.31 ft/sec, indicative of household ambulator   General Gait Details: Pt took short steps from EOB towards the door of his room. He was unable to clear the floor or pass the opposite foot with each step. Pt maintained fwd flex trunk despite VC/TC for correction. He quickly fatigues in standing and c/o dizziness.   Stairs             Wheelchair Mobility     Tilt Bed    Modified Rankin (Stroke Patients Only)       Balance Overall balance assessment: Needs assistance Sitting-balance support: Feet supported, No upper extremity supported Sitting balance-Leahy Scale: Fair Sitting balance - Comments: Pt sat EOB with supervision.   Standing balance support: Bilateral upper extremity supported, During functional activity, Reliant on assistive device for balance Standing balance-Leahy Scale: Poor Standing balance comment: Pt dependent on RW.                            Communication Communication Communication: Impaired Factors Affecting Communication: Hearing  impaired  Cognition Arousal: Alert Behavior During Therapy: Flat affect   PT - Cognitive impairments: No apparent impairments                         Following commands: Intact      Cueing Cueing Techniques: Verbal cues, Tactile cues, Gestural cues  Exercises      General Comments General  comments (skin integrity, edema, etc.): Pt greeted on 3L, intermittently desaturated to 85-87% with activity unable to recover with PLB technique. Increased to 6L to maintain SpO2>88%. BP: supine 116/65 (79), sitting EOB 112/57 (72), sitting following gait 116/69 (80).      Pertinent Vitals/Pain Pain Assessment Pain Assessment: No/denies pain    Home Living                          Prior Function            PT Goals (current goals can now be found in the care plan section) Acute Rehab PT Goals Patient Stated Goal: Move easier Progress towards PT goals: Progressing toward goals    Frequency    Min 2X/week      PT Plan      Co-evaluation              AM-PAC PT "6 Clicks" Mobility   Outcome Measure  Help needed turning from your back to your side while in a flat bed without using bedrails?: A Lot Help needed moving from lying on your back to sitting on the side of a flat bed without using bedrails?: A Lot Help needed moving to and from a bed to a chair (including a wheelchair)?: A Little Help needed standing up from a chair using your arms (e.g., wheelchair or bedside chair)?: A Little Help needed to walk in hospital room?: Total Help needed climbing 3-5 steps with a railing? : Total 6 Click Score: 12    End of Session Equipment Utilized During Treatment: Gait belt;Oxygen Activity Tolerance: Patient tolerated treatment well;Patient limited by fatigue Patient left: in chair;with call bell/phone within reach;with chair alarm set Nurse Communication: Mobility status;Other (comment) (VS response during session) PT Visit Diagnosis: History of falling (Z91.81);Muscle weakness (generalized) (M62.81);Unsteadiness on feet (R26.81);Difficulty in walking, not elsewhere classified (R26.2)     Time: 3086-5784 PT Time Calculation (min) (ACUTE ONLY): 18 min  Charges:    $Gait Training: 8-22 mins PT General Charges $$ ACUTE PT VISIT: 1 Visit                      Cheri Guppy, PT, DPT Acute Rehabilitation Services Office: [phone] Secure Chat Preferred  Chad Hogan 06/30/2023, 4:13 PM

## 2023-06-30 NOTE — Assessment & Plan Note (Addendum)
 Hypokalemia. Hyponatremia.   Volume status has improved, renal function today with serum cr at 2,98 with K at 4,2 and serum bicarbonate at 26  Na 131   Follow up renal function and electrolytes in am.  Avoid hypotension and nephrotoxic agents.

## 2023-06-30 NOTE — TOC Progression Note (Addendum)
 Transition of Care [facility]) - Progression Note    Patient Details  Name: Chad Hogan MRN: [id number] Date of Birth: 10/07/1940  Transition of Care [facility]) CM/SW Contact  Michaela Corner, [place] Phone Number: 06/30/2023, 2:31 PM  Clinical Narrative:   Per Palliative, patient and family would like to pursue SNF STR at this time. CSW called patients dtr, Patina, to confirm. Patina asked CSW to look into [name]. CSW asked permission to send referrals to other SNFs as well, Patina stated that is okay. CSW will complete SNF workup at this time.   2:39 PM CSW reached out to admissions with [name] to review patient referral.    2:41 PM [name] has accepted patient at this time. CSW notified treatment team.   2:59 PM Per MD, patient may potentially be medically stable Friday or Saturday. CSW will submit for insurance auth closer to dc.   TOC will continue to follow.    Expected Discharge Plan: Skilled Nursing Facility Barriers to Discharge: Continued Medical Work up, SNF Pending bed offer, English as a second language teacher  Expected Discharge Plan and Services In-house Referral: Clinical Social Work Discharge Planning Services: CM Consult Post Acute Care Choice: Home Health Living arrangements for the past 2 months: Single Family Home                 DME Arranged: N/A DME Agency: NA       HH Arranged: RN, PT, OT, Nurse's Aide HH Agency: [facility] Date [facility] Agency Contacted: 06/25/23 Time HH Agency Contacted: 1238 Representative spoke with at [facility] Agency: Dewayne Hatch   Social Determinants of Health (SDOH) Interventions SDOH Screenings   Food Insecurity: No Food Insecurity (06/22/2023)  Housing: Low Risk  (06/22/2023)  Transportation Needs: No Transportation Needs (06/22/2023)  Utilities: Not At Risk (06/22/2023)  Depression (PHQ2-9): Low Risk  (04/20/2023)  Social Connections: Socially Isolated (06/22/2023)  Tobacco Use: High Risk (06/22/2023)    Readmission  Risk Interventions    06/25/2023   12:29 PM  Readmission Risk Prevention Plan  Transportation Screening Complete  PCP or Specialist Appt within 5-7 Days Complete  Home Care Screening Complete  Medication Review (RN CM) Complete

## 2023-06-30 NOTE — Plan of Care (Signed)

## 2023-06-30 NOTE — Assessment & Plan Note (Addendum)
 No signs of acute exacerbation Continue bronchodilator therapy and supplemental 02 per Prentice.  He is on home 02 3 L/min

## 2023-06-30 NOTE — NC FL2 (Signed)
 [place] MEDICAID FL2 LEVEL OF CARE FORM     IDENTIFICATION  Patient Name: Chad Hogan Birthdate: 18-Sep-1940 Sex: male Admission Date (Current Location): 06/21/2023  [facility] and [place] Number:  Producer, television/film/video and Address:  [facility], [address] [place], [place] [address]      Provider Number: [id number]  Attending Physician Name and Address:  Coralie Keens,*  Relative Name and Phone Number:       Current Level of Care: Hospital Recommended Level of Care: Skilled Nursing Facility Prior Approval Number:    Date Approved/Denied:   PASRR Number: [id number]  Discharge Plan: SNF    Current Diagnoses: Patient Active Problem List   Diagnosis Date Noted   CAP (community acquired pneumonia) 06/30/2023   Acute kidney injury superimposed on stage 3b chronic kidney disease (HCC) 06/30/2023   COPD (chronic obstructive pulmonary disease) (HCC) 06/30/2023   Anxiety 06/30/2023   Peripheral edema 06/29/2023   DNR (do not resuscitate) 06/29/2023   Renal cell carcinoma (HCC) 06/29/2023   Liver cirrhosis (HCC) 06/28/2023   Acute on chronic diastolic CHF (congestive heart failure) (HCC) 06/22/2023   Right renal mass 04/20/2023   (HFpEF) heart failure with preserved ejection fraction (HCC) 10/09/2019   COPD with acute exacerbation (HCC)    Sepsis due to pneumonia (HCC)    Chronic diastolic CHF (congestive heart failure) (HCC)    Wolff-Parkinson-White syndrome    Thoracic ascending aortic aneurysm (HCC)    Obesity hypoventilation syndrome (HCC)    OSA on CPAP    CKD (chronic kidney disease), stage III (HCC) 12/16/2016   Acute on chronic respiratory failure with hypoxia (HCC) 12/16/2016   NSVT (nonsustained ventricular tachycardia) (HCC) 12/16/2016   Hypocalcemia 12/16/2016   SVT (supraventricular tachycardia) (HCC) 03/23/2016   WPW syndrome 03/23/2016   Bradycardia 03/11/2016   Ventricular tachycardia (HCC) 03/11/2016   Benign  neoplasm of colon 06/11/2014   Diverticulosis of colon without hemorrhage 06/11/2014   Internal and external hemorrhoids without complication 06/11/2014   Duodenal mass 06/11/2014   History of iron deficiency anemia 05/02/2014   Acute pulmonary embolism (HCC) 05/02/2014   PALPITATIONS 02/11/2010   ATYPICAL DEPRESSIVE DISORDER 08/21/2008   PULMONARY FIBROSIS 09/15/2007   Chronic diastolic heart failure (HCC) 09/15/2007   HYPERLIPIDEMIA 08/16/2007   Sleep apnea 08/16/2007   Morbid obesity (HCC) 08/15/2007   Anxiety state 08/15/2007   Essential hypertension 08/15/2007   ISCHEMIC HEART DISEASE 08/15/2007   WOLFF (WOLFE)-PARKINSON-WHITE (WPW) SYNDROME 08/15/2007   PREMATURE VENTRICULAR CONTRACTIONS 08/15/2007   Other specified chronic obstructive pulmonary disease (HCC) 08/15/2007    Orientation RESPIRATION BLADDER Height & Weight     Self, Time, Place, Situation  O2 (3L [place]) Incontinent, External catheter Weight: 241 lb 6.5 oz (109.5 kg) Height:  5\' 11"  (180.3 cm)  BEHAVIORAL SYMPTOMS/MOOD NEUROLOGICAL BOWEL NUTRITION STATUS      Continent Diet (see dc summary)  AMBULATORY STATUS COMMUNICATION OF NEEDS Skin   Extensive Assist Verbally Other (Comment) (Wound / Incision - Pretibial Lower;Bilateral Red and blisters)                       Personal Care Assistance Level of Assistance  Bathing, Feeding, Dressing Bathing Assistance: Maximum assistance Feeding assistance: Limited assistance Dressing Assistance: Maximum assistance     Functional Limitations Info  Sight, Hearing, Speech Sight Info: Impaired (Eyeglasses) Hearing Info: Adequate Speech Info: Adequate    SPECIAL CARE FACTORS FREQUENCY  PT (By licensed PT), OT (By  licensed OT)     PT Frequency: 5x week OT Frequency: 5x week            Contractures Contractures Info: Not present    Additional Factors Info  Code Status, Allergies Code Status Info: DNR Limited Allergies Info: Eliquis Apixaban, Codeine            Current Medications (06/30/2023):  This is the current hospital active medication list Current Facility-Administered Medications  Medication Dose Route Frequency Provider Last Rate Last Admin   acetaminophen (TYLENOL) tablet 650 mg  650 mg Oral Q6H PRN Alan Mulder, MD       Or   acetaminophen (TYLENOL) suppository 650 mg  650 mg Rectal Q6H PRN Dorrell, Molly Maduro, MD       ALPRAZolam Prudy Feeler) tablet 1 mg  1 mg Oral BID PRN Marlin Canary U, DO   1 mg at 06/30/23 1407   ciprofloxacin (CIPRO) tablet 500 mg  500 mg Oral Daily Marcelino Duster, MD   500 mg at 06/30/23 5956   docusate sodium (COLACE) capsule 100 mg  100 mg Oral BID Marcelino Duster, MD   100 mg at 06/30/23 0921   [START ON 07/01/2023] enoxaparin (LOVENOX) injection 30 mg  30 mg Subcutaneous Daily Silvana Newness, RPH       furosemide (LASIX) injection 80 mg  80 mg Intravenous BID Wendall Stade, MD       guaiFENesin (MUCINEX) 12 hr tablet 600 mg  600 mg Oral BID PRN John Giovanni, MD   600 mg at 06/24/23 3875   ipratropium-albuterol (DUONEB) 0.5-2.5 (3) MG/3ML nebulizer solution 3 mL  3 mL Nebulization Q6H PRN Kc, Dayna Barker, MD   3 mL at 06/24/23 0559   levothyroxine (SYNTHROID) tablet 25 mcg  25 mcg Oral Daily Kc, Dayna Barker, MD   25 mcg at 06/30/23 6433   mometasone-formoterol (DULERA) 200-5 MCG/ACT inhaler 2 puff  2 puff Inhalation BID Alan Mulder, MD   2 puff at 06/30/23 0811   ondansetron (ZOFRAN) tablet 4 mg  4 mg Oral Q6H PRN Alan Mulder, MD       Or   ondansetron (ZOFRAN) injection 4 mg  4 mg Intravenous Q6H PRN Dorrell, Robert, MD       oxyCODONE (Oxy IR/ROXICODONE) immediate release tablet 5 mg  5 mg Oral Q4H PRN Dorrell, Robert, MD       pantoprazole (PROTONIX) EC tablet 40 mg  40 mg Oral q morning Kc, Ramesh, MD   40 mg at 06/30/23 0921   polyethylene glycol (MIRALAX / GLYCOLAX) packet 17 g  17 g Oral Daily Marcelino Duster, MD   17 g at 06/30/23 2951   senna (SENOKOT) tablet 8.6 mg  1 tablet  Oral QHS Marcelino Duster, MD   8.6 mg at 06/29/23 2123   spironolactone (ALDACTONE) tablet 25 mg  25 mg Oral Daily Yvonna Alanis L, PA-C   25 mg at 06/30/23 8841   verapamil (CALAN-SR) CR tablet 240 mg  240 mg Oral BID Alan Mulder, MD   240 mg at 06/30/23 6606     Discharge Medications: Please see discharge summary for a list of discharge medications.  Relevant Imaging Results:  Relevant Lab Results:   Additional Information SSN: [id number]  Michaela Corner, LCSWA

## 2023-06-30 NOTE — Progress Notes (Signed)
 Patient ID: Chad Hogan, male   DOB: 04/07/1941, 83 y.o.   MRN: [id number]    Progress Note from the Palliative Medicine Team at [facility]   Patient Name: Chad Hogan        Date: 06/30/2023 DOB: 03-23-1941  Age: 83 y.o. MRN#: [id number] Attending Physician: Coralie Keens Primary Care Physician: Street, [name], MD Admit Date: 06/21/2023   Reason for Consultation/Follow-up   Establishing Goals of Care   HPI/ Brief Hospital Review  83 y.o. male   admitted on 06/21/2023 with past medical history of HTN, HLD, CAD, history of PE, CKD stage 3b, liver cirrhosis, history of SVT, WPW, COPD, HFpEF, OSA on CPAP, RCC/diagnosed in 2017. (According to daughter patient workup)  S/p paracentesis 2 days ago  Patient has had continued physical and functional decline over the past many months.   Patient and his family face treatment option decisions, advanced directive decisions and anticipatory care needs.      Subjective  Extensive chart review has been completed prior to meeting with patient/family  including labs, vital signs, imaging, progress/consult notes, orders, medications and available advance directive documents.    This NP assessed patient at the bedside as a follow up to  yesterday's GOCs meeting.  I met with patient's daughter/[name] at bedside for scheduled meeting today for ongoing discussion regarding goals of care.  Again a detailed discussion was had with patient regarding the seriousness of his current medical situation specific to his multiple comorbidities    Education offered regarding important healthcare decisions specific to code status, artifical feeding and hydration, continued IV antibiotics and rehospitalization.    The difference between an aggressive medical intervention path  and a palliative comfort care path for this specific patient was detailed.   Education offered on hospice benefit; philosophy and eligibility.        Questions and  concerns addressed  Anticipatory care needs are over arching.  Plan of care: -DNR/DNI-documented today -continue current medical support -Disposition-daughter tells me that she has spoken directly with [facility] in [place] and they tell her they have a bed available for her father.    Strongly encouraged clarification    Education offered today regarding  the importance of continued conversation with family and their  medical providers regarding overall plan of care and treatment options,  ensuring decisions are within the context of the patients values and GOCs.  Questions and concerns addressed   Discussed with primary team and nursing staff   Time: 65  minutes  Detailed review of medical records ( labs, imaging, vital signs), medically appropriate exam ( MS, skin, cardiac,  resp)   discussed with treatment team, counseling and education to patient, family, staff, documenting clinical information, medication management, coordination of care    Lorinda Creed NP  Palliative Medicine Team Team Phone # [phone] Pager [phone]

## 2023-06-30 NOTE — Assessment & Plan Note (Signed)
 Continue with levothyroxine

## 2023-06-30 NOTE — Assessment & Plan Note (Addendum)
 Echocardiogram with preserved LV systolic function with EF 55%, D shaped septum with RV pressure and volume overload, RV systolic function preserved, moderate RV enlargement,   Urine output 1,500  ml Systolic blood pressure 111 mmHg range.   Continue verapamil, considering worsening renal function will stop spironolactone and resume loop diuretic with furosemide 80 mg IV bid.

## 2023-06-30 NOTE — TOC Progression Note (Signed)
 Transition of Care [facility]) - Progression Note    Patient Details  Name: Chad Hogan MRN: [id number] Date of Birth: Mar 10, 1941  [facility]) CM/SW Contact  Leone Haven, RN Phone Number: 06/30/2023, 3:02 PM  Clinical Narrative:    NCM informed Alvino Chapel with [facility] that patient will be going to SNF for dispo, she will cancel the [facility] referral.   Expected Discharge Plan: Skilled Nursing Facility Barriers to Discharge: Continued Medical Work up, SNF Pending bed offer, English as a second language teacher  Expected Discharge Plan and Services In-house Referral: Clinical Social Work Discharge Planning Services: CM Consult Post Acute Care Choice: Home Health Living arrangements for the past 2 months: Single Family Home                 DME Arranged: N/A DME Agency: NA       HH Arranged: RN, PT, OT, Nurse's Aide HH Agency: [facility] Date [facility] Agency Contacted: 06/25/23 Time HH Agency Contacted: 1238 Representative spoke with at [facility] Agency: Dewayne Hatch   Social Determinants of Health (SDOH) Interventions SDOH Screenings   Food Insecurity: No Food Insecurity (06/22/2023)  Housing: Low Risk  (06/22/2023)  Transportation Needs: No Transportation Needs (06/22/2023)  Utilities: Not At Risk (06/22/2023)  Depression (PHQ2-9): Low Risk  (04/20/2023)  Social Connections: Socially Isolated (06/22/2023)  Tobacco Use: High Risk (06/22/2023)    Readmission Risk Interventions    06/25/2023   12:29 PM  Readmission Risk Prevention Plan  Transportation Screening Complete  PCP or Specialist Appt within 5-7 Days Complete  Home Care Screening Complete  Medication Review (RN CM) Complete

## 2023-06-30 NOTE — Assessment & Plan Note (Addendum)
 Locally advanced right sided renal cell carcinoma.   2024 November CT  Enlarging heterogeneous mass lower pole right kidney consistent with progressive renal cell carcinoma. This mass now extends into right renal pelvis and renal sinus fat without evidence of extension into the renal vasculature.  Downstream soft tissue density within the distal right ureter protruding through the right UVJ, which may reflect distal urothelial neoplasm, metastasis of blood clot.  Enlarging heterogenous soft tissue mesenteric mass anterior to the duodenum.   Reviewed oncology note from 04/2023, pending PET scan.  Possible palliative therapy.   Considering his current condition doubt he will be candidate for further cancer therapy.  May benefit from further imaging, will follow up with oncology. Dr Melvyn Neth.  Will check if can send cytology on ascitic fluid.

## 2023-06-30 NOTE — Plan of Care (Signed)
  Problem: Clinical Measurements: Goal: Ability to maintain clinical measurements within normal limits will improve Outcome: Progressing   Problem: Nutrition: Goal: Adequate nutrition will be maintained Outcome: Progressing   Problem: Pain Managment: Goal: General experience of comfort will improve and/or be controlled Outcome: Progressing   Problem: Skin Integrity: Goal: Risk for impaired skin integrity will decrease Outcome: Progressing

## 2023-07-01 ENCOUNTER — Inpatient Hospital Stay (HOSPITAL_COMMUNITY)

## 2023-07-01 DIAGNOSIS — I5033 Acute on chronic diastolic (congestive) heart failure: Secondary | ICD-10-CM | POA: Diagnosis not present

## 2023-07-01 DIAGNOSIS — J189 Pneumonia, unspecified organism: Secondary | ICD-10-CM | POA: Diagnosis not present

## 2023-07-01 DIAGNOSIS — N179 Acute kidney failure, unspecified: Secondary | ICD-10-CM | POA: Diagnosis not present

## 2023-07-01 DIAGNOSIS — I10 Essential (primary) hypertension: Secondary | ICD-10-CM | POA: Diagnosis not present

## 2023-07-01 LAB — COMPREHENSIVE METABOLIC PANEL
ALT: 27 U/L (ref 0–44)
AST: 42 U/L — ABNORMAL HIGH (ref 15–41)
Albumin: 2.6 g/dL — ABNORMAL LOW (ref 3.5–5.0)
Alkaline Phosphatase: 163 U/L — ABNORMAL HIGH (ref 38–126)
Anion gap: 10 (ref 5–15)
BUN: 52 mg/dL — ABNORMAL HIGH (ref 8–23)
CO2: 28 mmol/L (ref 22–32)
Calcium: 8.2 mg/dL — ABNORMAL LOW (ref 8.9–10.3)
Chloride: 97 mmol/L — ABNORMAL LOW (ref 98–111)
Creatinine, Ser: 2.59 mg/dL — ABNORMAL HIGH (ref 0.61–1.24)
GFR, Estimated: 24 mL/min — ABNORMAL LOW (ref >60)
Glucose, Bld: 113 mg/dL — ABNORMAL HIGH (ref 70–99)
Potassium: 3.4 mmol/L — ABNORMAL LOW (ref 3.5–5.1)
Sodium: 135 mmol/L (ref 135–145)
Total Bilirubin: 0.6 mg/dL (ref 0.0–1.2)
Total Protein: 5.5 g/dL — ABNORMAL LOW (ref 6.5–8.1)

## 2023-07-01 LAB — CYTOLOGY - NON PAP

## 2023-07-01 NOTE — Progress Notes (Signed)
 Progress Note   Patient: Chad Hogan QVZ:[id number] DOB: Jan 31, 1941 DOA: 06/21/2023     9 DOS: the patient was seen and examined on 07/01/2023   Brief hospital course: Chad Hogan was admitted to the hospital with the working diagnosis of heart failure decompensation.   82 yom w/ HTN HLD, CAD, PE, CKD stage IIIb who presented to ED w/ complaints of dizziness tiredness and lightheadedness  and difficulty ambulating and shortness of breath and progressively worsening swelling in his legs and abdomen over the last week , treated at his nursing home for recurrent pneumonia with multiple rounds of antibiotics without improvement. In the ED hemodynamically stable afebrile labs reviewed normal wbc, somewhat elevated BNP 291 creatinine 1.6 AST 54 ALP 102 troponin 11>9 9, chest x-ray left-sided pleural effusion and opacities as well as pulmonary venous congestion and patient not admitted for further workup, started on IV antibiotics given Lasix 40 mg.  03/20 patient has responded well to diuresis, plan to transfer to SNF.    Assessment and Plan: * Acute on chronic diastolic CHF (congestive heart failure) (HCC) Echocardiogram with preserved LV systolic function with EF 55%, D shaped septum with RV pressure and volume overload, RV systolic function preserved, moderate RV enlargement,   Urine output 2,500 ml Systolic blood pressure 110 mmHg range.   Plan to continue diuresis with furosemide 80 mg IV bid Spironolactone and verapamil.   Essential hypertension Continue blood pressure control with verapamil and spironolactone.   CAP (community acquired pneumonia) Patient had 5 days of appropriate antibiotic therapy.   Acute kidney injury superimposed on stage 3b chronic kidney disease (HCC) Hypokalemia.   Serum cr today down to 2,59 with K at 3,4 and serum bicarbonate at 28  Na 135   Pl;an to continue diuresis with furosemide and spironolactone. Follow up renal function and electrolytes in am.   Avoid hypotension and nephrotoxic medications.   COPD (chronic obstructive pulmonary disease) (HCC) No signs of acute exacerbation Continue bronchodilator therapy and supplemental 02 per Chad Hogan.  Hi is on home 02 3 L/min   Obesity, class 1 Calculated BMI is 33,6   Anxiety Continue with alprazolam.   Liver cirrhosis (HCC) No signs of acute decompensation Continue heart failure therapy.   Renal cell carcinoma (HCC) Follow up as outpatient.   Hypothyroidism Continue with levothyroxine.         Subjective: Patient with improvement in dyspnea and edema, continue very weak and deconditioned.   Physical Exam: Vitals:   07/01/23 0742 07/01/23 1145 07/01/23 1300 07/01/23 1510  BP: (!) 111/55 (!) 115/59 115/63 113/60  Pulse: 64 73 74 70  Resp:  18 (!) 21 (!) 29  Temp: 98.7 F (37.1 C) (!) 97.3 F (36.3 C) 97.6 F (36.4 C) 97.8 F (36.6 C)  TempSrc: Oral Oral Oral Oral  SpO2:  92%  92%  Weight:      Height:       Neurology awake and alert, deconditioned and ill looking appearing  ENT with mild pallor, dry mucous membranes Cardiovascular with S1 and S2 present and regular with no gallops or rubs, positive systolic murmur at apex No JVD Positive lower extremity edema +  Respiratory with scattered rales on anterior auscultation with no wheezing or rhonchi Abdomen with no distention  Data Reviewed:    Family Communication: no family at the bedside   Disposition: Status is: Inpatient Remains inpatient appropriate because: heart failure   Planned Discharge Destination: Skilled nursing facility    Author: Coralie Keens,  MD 07/01/2023 3:51 PM  For on call review [email or web address].

## 2023-07-01 NOTE — TOC Progression Note (Addendum)
 Transition of Care [facility]) - Progression Note    Patient Details  Name: Chad Hogan MRN: [id number] Date of Birth: Feb 13, 1941  [facility] CM/SW Contact  Michaela Corner, [place] Phone Number: 07/01/2023, 12:22 PM  Clinical Narrative:   CSW submitted for insurance auth for [employer]. Auth id [id number].   3:12 PM Auth still pending at this time.  TOC will continue to follow.    Expected Discharge Plan: Skilled Nursing Facility Barriers to Discharge: Continued Medical Work up, SNF Pending bed offer, English as a second language teacher  Expected Discharge Plan and Services In-house Referral: Clinical Social Work Discharge Planning Services: CM Consult Post Acute Care Choice: Home Health Living arrangements for the past 2 months: Single Family Home                 DME Arranged: N/A DME Agency: NA       HH Arranged: RN, PT, OT, Nurse's Aide HH Agency: [facility] Date [facility] Agency Contacted: 06/25/23 Time HH Agency Contacted: 1238 Representative spoke with at [facility] Agency: Dewayne Hatch   Social Determinants of Health (SDOH) Interventions SDOH Screenings   Food Insecurity: No Food Insecurity (06/22/2023)  Housing: Low Risk  (06/22/2023)  Transportation Needs: No Transportation Needs (06/22/2023)  Utilities: Not At Risk (06/22/2023)  Depression (PHQ2-9): Low Risk  (04/20/2023)  Social Connections: Socially Isolated (06/22/2023)  Tobacco Use: High Risk (06/22/2023)    Readmission Risk Interventions    06/25/2023   12:29 PM  Readmission Risk Prevention Plan  Transportation Screening Complete  PCP or Specialist Appt within 5-7 Days Complete  Home Care Screening Complete  Medication Review (RN CM) Complete

## 2023-07-01 NOTE — Progress Notes (Signed)
 Mobility Specialist Progress Note:    07/01/23 1015  Mobility  Activity Transferred to/from [facility] (to [facility])  Level of Assistance Minimal assist, patient does 75% or more  Assistive Device Front wheel walker  Distance Ambulated (ft) 3 ft  Activity Response Tolerated well  Mobility Referral Yes  Mobility visit 1 Mobility  Mobility Specialist Start Time (ACUTE ONLY) 1003  Mobility Specialist Stop Time (ACUTE ONLY) 1015  Mobility Specialist Time Calculation (min) (ACUTE ONLY) 12 min   Pt received in bed hesitant but agreeable to mobility. Pt requested to use [facility]. MinA required for bed mobility and STS. Was able to take a couple steps towards the [facility] w/o fault. Left on [facility] w/ call bell in reach, instructed to use call bell when finished.   Thompson Grayer Mobility Specialist  Please contact vis Secure Chat or  Rehab Office [phone]

## 2023-07-01 NOTE — Progress Notes (Addendum)
 Patient Name: TREVELLE MCGURN Date of Encounter: 07/01/2023 [facility] Cardiologist: Sherryl Manges, MD   Interval Summary  .    Uncomfortable in bed Abdomen still very distended  Vital Signs .    Vitals:   07/01/23 0451 07/01/23 0452 07/01/23 0720 07/01/23 0742  BP:    (!) 111/55  Pulse: 64 62  64  Resp: 19 20    Temp:    98.7 F (37.1 C)  TempSrc:   Oral Oral  SpO2: 92% 92%    Weight:      Height:        Intake/Output Summary (Last 24 hours) at 07/01/2023 1007 Last data filed at 07/01/2023 0742 Gross per 24 hour  Intake 488 ml  Output 1900 ml  Net -1412 ml      06/30/2023    4:25 AM 06/29/2023    4:14 AM 06/28/2023    4:29 AM  Last 3 Weights  Weight (lbs) 241 lb 6.5 oz 250 lb 12.8 oz 259 lb 8 oz  Weight (kg) 109.5 kg 113.762 kg 117.708 kg      Telemetry/ECG    Sinus rhythm heart rates in 70s- Personally Reviewed  CV Studies    Echocardiogram 06/22/2023 1. Left ventricular ejection fraction, by estimation, is 55%. The left  ventricle has normal function. Left ventricular endocardial border not  optimally defined to evaluate regional wall motion. Left ventricular  diastolic parameters are consistent with  Grade I diastolic dysfunction (impaired relaxation).   2. D-shaped septum suggests a degree of RV pressure/volume overload.  Right ventricular systolic function is normal. The right ventricular size  is moderately enlarged. Tricuspid regurgitation signal is inadequate for  assessing PA pressure.   3. The mitral valve was not well visualized. No evidence of mitral valve  regurgitation. No evidence of mitral stenosis.   4. The aortic valve was not well visualized. Aortic valve regurgitation  is not visualized. No aortic stenosis is present.   5. IVC not well-visualized.   6. Technically very difficult study. No structure was well-visualized. I  get the impression that the LV function is normal but there is a degree of  RV dysfunction.   Physical  Exam .   GEN: Ill-appearing, on supplemental oxygen, slight cough Neck: No JVD Cardiac: RRR, no murmurs, rubs, or gallops.  Respiratory: Diffuse crackles, improved GI: Large firm distended abdomen, dullness to percussion worse on the left side MS: 1-2+ pitting edema with wraps   Patient Profile    VINH SACHS is a 83 y.o. male has hx of  HTN, HLD, CAD, history of PE, CKD stage 3b, liver cirrhosis, history of SVT, WPW, COPD, HFpEF, OSA on CPAP, RCC.  Assessment & Plan .     Acute on chronic HFpEF Liver cirrhosis RCC -3/17 status post paracentesis 5 L, metolazone 2.5 mg once   Cr stabel 2.59 K 3.4  Lasix 80 mg iv bid drip stopped 06/30/23  Consider repeat abdominal US and repeat paracentesis Cytology pending  Now 3.4 L's diuresis with 5L paracentesis   History of VT with loss of preexcitation Status post incomplete ablation of left lateral accessory pathway 03/2016 No acute arrhythmias here, continue with verapamil 240 mg twice daily  CKD Worsening renal function, creatinine 2.59 now. Lasix drip changed to bid iv  Bacterial PNA Acute on chronic respiratory failure COPD OSA Liver cirrhosis RCC Per primary Team  Would benefit from egg crate mattress topper for bed   For questions or updates, please  contact [facility] Please consult [email or web address] for contact info under        Signed, Charlton Haws, MD

## 2023-07-01 NOTE — Plan of Care (Signed)

## 2023-07-01 NOTE — Consult Note (Signed)
 [facility] Liaison Consult Note   07/01/2023  Chad Hogan 12/18/1940 [id number]  Insurance: [employer]ine Graven Medicare  Primary Care Provider: Street, [name], MD, with [facility], this provider is listed for the transition of care follow up appointments  and community [facility] calls   [facility] Liaison screened the patient remotely at [facility]. Following for transitional needs and reviewed for LOS 9 days.   The patient was screened for LOS 9-day hospitalization with noted rise to high risk score for unplanned readmission risk 1 hospital admissions in 6 months.  The patient was assessed for potential [facility] Coordination service needs for post hospital transition for care coordination. Review of patient's electronic medical record with Inpatient TOC LCSW and RN, Palliative consult reveals patient is recommended for SNF rehab at this time.   Plan: [facility] Liaison will continue to follow progress and disposition to assess for post hospital community care coordination needs.  Referral request for community care coordination: Currently, patient's post hospital needs are to be met at a skilled nursing facility LOC.   VBCI Community Care, Population Health does not replace or interfere with any arrangements made by the Inpatient Transition of Care team.   For questions contact:   [name], RN, BSN, CCM [place]  [facility], [facility] Liaison Direct Dial: [phone] or secure chat Email: [email or web address]

## 2023-07-02 ENCOUNTER — Inpatient Hospital Stay (HOSPITAL_COMMUNITY)

## 2023-07-02 ENCOUNTER — Encounter (HOSPITAL_COMMUNITY): Payer: Self-pay | Admitting: Interventional Radiology

## 2023-07-02 DIAGNOSIS — I5033 Acute on chronic diastolic (congestive) heart failure: Secondary | ICD-10-CM | POA: Diagnosis not present

## 2023-07-02 DIAGNOSIS — R188 Other ascites: Secondary | ICD-10-CM | POA: Diagnosis not present

## 2023-07-02 DIAGNOSIS — I10 Essential (primary) hypertension: Secondary | ICD-10-CM | POA: Diagnosis not present

## 2023-07-02 DIAGNOSIS — J189 Pneumonia, unspecified organism: Secondary | ICD-10-CM | POA: Diagnosis not present

## 2023-07-02 DIAGNOSIS — C641 Malignant neoplasm of right kidney, except renal pelvis: Secondary | ICD-10-CM | POA: Diagnosis not present

## 2023-07-02 DIAGNOSIS — J449 Chronic obstructive pulmonary disease, unspecified: Secondary | ICD-10-CM | POA: Diagnosis not present

## 2023-07-02 HISTORY — PX: IR PARACENTESIS: IMG2679

## 2023-07-02 LAB — BASIC METABOLIC PANEL
Anion gap: 14 (ref 5–15)
BUN: 58 mg/dL — ABNORMAL HIGH (ref 8–23)
CO2: 26 mmol/L (ref 22–32)
Calcium: 8.1 mg/dL — ABNORMAL LOW (ref 8.9–10.3)
Chloride: 93 mmol/L — ABNORMAL LOW (ref 98–111)
Creatinine, Ser: 2.94 mg/dL — ABNORMAL HIGH (ref 0.61–1.24)
GFR, Estimated: 20 mL/min — ABNORMAL LOW (ref >60)
Glucose, Bld: 117 mg/dL — ABNORMAL HIGH (ref 70–99)
Potassium: 3.7 mmol/L (ref 3.5–5.1)
Sodium: 133 mmol/L — ABNORMAL LOW (ref 135–145)

## 2023-07-02 LAB — MAGNESIUM: Magnesium: 2.1 mg/dL (ref 1.7–2.4)

## 2023-07-02 MED ORDER — LIDOCAINE HCL 1 % IJ SOLN
INTRAMUSCULAR | Status: AC
  Filled 2023-07-02: qty 20

## 2023-07-02 MED ORDER — LIDOCAINE HCL 1 % IJ SOLN
20.0000 mL | Freq: Once | INTRAMUSCULAR | Status: AC
  Administered 2023-07-02: 10 mL

## 2023-07-02 MED ORDER — ALBUMIN HUMAN 25 % IV SOLN
75.0000 g | Freq: Once | INTRAVENOUS | Status: AC
  Administered 2023-07-02: 75 g via INTRAVENOUS
  Filled 2023-07-02: qty 300

## 2023-07-02 NOTE — Progress Notes (Addendum)
 Progress Note   Patient: Chad Hogan [id number]:[id number] DOB: [birth year] DOA: 06/21/2023     10 DOS: the patient was seen and examined on 07/02/2023   Brief hospital course: Mr. Paulsen was admitted to the hospital with the working diagnosis of heart failure decompensation.   82 yom w/ HTN HLD, CAD, PE, CKD stage IIIb who presented to ED w/ complaints of dizziness tiredness and lightheadedness  and difficulty ambulating and shortness of breath and progressively worsening swelling in his legs and abdomen over the last week , treated at his nursing home for recurrent pneumonia with multiple rounds of antibiotics without improvement. In the ED hemodynamically stable afebrile labs reviewed normal wbc, somewhat elevated BNP 291 creatinine 1.6 AST 54 ALP 102 troponin 11>9 9, chest x-ray left-sided pleural effusion and opacities as well as pulmonary venous congestion and patient not admitted for further workup, started on IV antibiotics given Lasix 40 mg.  03/20 patient has responded well to diuresis, plan to transfer to SNF.  03/21 paracentesis with 10 L removed.    Assessment and Plan: * Acute on chronic diastolic CHF (congestive heart failure) (HCC) Echocardiogram with preserved LV systolic function with EF 55%, D shaped septum with RV pressure and volume overload, RV systolic function preserved, moderate RV enlargement,   Urine output 400 ml Systolic blood pressure 110 mmHg range.   Hold on furosemide for now,  Spironolactone and verapamil.  Large volume paracentesis today 10 L, ordered 75 g IV albumin to avoid hypotension.   Essential hypertension Continue blood pressure control with verapamil and spironolactone.   CAP (community acquired pneumonia) Patient had 5 days of appropriate antibiotic therapy.   Acute kidney injury superimposed on stage 3b chronic kidney disease (HCC) Hypokalemia. Hyponatremia.   Worsening renal function with serum cr at 2,94 with K at 3,7 and serum bicarbonate at  26  Na 133. Mg 2,1   Continue spironolactone and hold on furosemide for now. Follow up renal function and electrolytes in am.  Avoid hypotension and nephrotoxic medications.   COPD (chronic obstructive pulmonary disease) (HCC) No signs of acute exacerbation Continue bronchodilator therapy and supplemental 02 per [place].  Hi is on home 02 3 L/min   Obesity, class 1 Calculated BMI is 33,6   Anxiety Continue with alprazolam.   Liver cirrhosis (HCC) Worsening ascites, with portal hypertension. Poor prognosis.  Today had repeat paracentesis, with 10 L removed.  Will add albumin.  Patient with no encephalopathy.   Renal cell carcinoma (HCC) Follow up as outpatient.   Hypothyroidism Continue with levothyroxine.         Subjective: patient with abdominal distention, very weak and deconditioned, continue to have dyspnea, and lower extremity edema   Physical Exam: Vitals:   07/02/23 0033 07/02/23 0306 07/02/23 0825 07/02/23 1217  BP: 122/61 119/63  115/62  Pulse: 67 70  77  Resp: 19 (!) 21  20  Temp: (!) 97.4 F (36.3 C) 97.7 F (36.5 C)  97.8 F (36.6 C)  TempSrc: Oral Oral  Oral  SpO2: 94% 94% 90% 92%  Weight:  111.9 kg    Height:       Neurology awake and alert ENT with mild pallor with no icterus Cardiovascular with S1 and S2 present and regular with no gallops or rubs, positive systolic murmur at the right lower sternal border Respiratory with mild rales with no wheezing, no rhonchi Abdomen with positive distention  Positive lower extremity edema  Data Reviewed:    Family Communication: I spoke  with patient's daughter at the bedside, we talked in detail about patient's condition, plan of care and prognosis and all questions were addressed.   Disposition: Status is: Inpatient Remains inpatient appropriate because: diuresis, volume overload.   Planned Discharge Destination: Skilled nursing facility      Author: Coralie Keens, MD 07/02/2023  1:47 PM  For on call review [email or web address].

## 2023-07-02 NOTE — Procedures (Signed)
 PROCEDURE SUMMARY:  Successful image-guided therapeutic paracentesis from the left lower abdomen.  Yielded 10 liters of yellow fluid.  No immediate complications.  EBL: zero Patient tolerated well.   Please see imaging section of Epic for full dictation.  Bladyn Tipps NP 07/02/2023 4:22 PM

## 2023-07-02 NOTE — TOC Progression Note (Addendum)
 Transition of Care [facility]) - Progression Note    Patient Details  Name: Chad Hogan MRN: [id number] Date of Birth: [birth year]  Transition of [facility]) CM/SW Contact  Michaela Corner, [place] Phone Number: 07/02/2023, 10:44 AM  Clinical Narrative:   [name] is still pending at this time.  3:10 PM Auth still pending at this time.   4:17 PM Auth still pending.   TOC will continue to follow.    Expected Discharge Plan: Skilled Nursing Facility Barriers to Discharge: Continued Medical Work up, SNF Pending bed offer, English as a second language teacher  Expected Discharge Plan and Services In-house Referral: Clinical Social Work Discharge Planning Services: CM Consult Post Acute Care Choice: Home Health Living arrangements for the past 2 months: Single Family Home                 DME Arranged: N/A DME Agency: NA       HH Arranged: RN, PT, OT, Nurse's Aide HH Agency: [facility] Date [facility] Agency Contacted: 06/25/23 Time HH Agency Contacted: 1238 Representative spoke with at [facility] Agency: Dewayne Hatch   Social Determinants of Health (SDOH) Interventions SDOH Screenings   Food Insecurity: No Food Insecurity (06/22/2023)  Housing: Low Risk  (06/22/2023)  Transportation Needs: No Transportation Needs (06/22/2023)  Utilities: Not At Risk (06/22/2023)  Depression (PHQ2-9): Low Risk  (04/20/2023)  Social Connections: Socially Isolated (06/22/2023)  Tobacco Use: High Risk (06/22/2023)    Readmission Risk Interventions    06/25/2023   12:29 PM  Readmission Risk Prevention Plan  Transportation Screening Complete  PCP or Specialist Appt within 5-7 Days Complete  Home Care Screening Complete  Medication Review (RN CM) Complete

## 2023-07-02 NOTE — Progress Notes (Signed)
 Physical Therapy Treatment Patient Details Name: Chad Hogan MRN: [id number] DOB: [birth year] Today's Date: 07/02/2023   History of Present Illness 83 y.o. male admitted 3/10 with fatigue, LB edema, SOB with acute on chronic HFpEF. PMhx: HTN, HLD, CAD, PE, CKD, COPD (3L O2 at baseline), OSA, Wolff-Parkinson-white syndrome, liver cirrhosis    PT Comments  Pt greeted supine in bed, pleasant and agreeable to PT session. He requires increase time to perform functional mobility and acclimate to position changes. Pt engaged in a couple of LE exercises while seated EOB prior to gait. He ambulated ~19ft using RW with CGA before requiring a seated rest break. Pt continues to demonstrated reduced activity tolerance and is limited by "fuzzyheaded" feeling although BP is stable. Patient will benefit from continued inpatient follow up therapy, <3 hours/day.    If plan is discharge home, recommend the following: Help with stairs or ramp for entrance;Assistance with cooking/housework;Assist for transportation;A lot of help with walking and/or transfers;A lot of help with bathing/dressing/bathroom   Can travel by private vehicle     Yes  Equipment Recommendations  Wheelchair (measurements PT);Wheelchair cushion (measurements PT)    Recommendations for Other Services       Precautions / Restrictions Precautions Precautions: Fall Recall of Precautions/Restrictions: Intact Restrictions Weight Bearing Restrictions Per Provider Order: No     Mobility  Bed Mobility Overal bed mobility: Needs Assistance Bed Mobility: Supine to Sit     Supine to sit: HOB elevated, Used rails, Mod assist     General bed mobility comments: Pt sat up on R side of bed with modA at trunk. He brought BLE off EOB with increased time. Assisted pt with scooting by pulling bed pad with modA for his feet to reach support.    Transfers Overall transfer level: Needs assistance Equipment used: Rolling walker (2  wheels) Transfers: Sit to/from Stand Sit to Stand: Min assist           General transfer comment: Pt stood from lowest bed height and recliner chair with minA to power up. He was able to scoot fwd/bkwd in the recliner chair with BUE support on armrests. Good eccentric control with sitting. Pt demonstrated proper hand positioning this session, but required VC to push down onto RW for increased support instead of pulling up once in standing. He takes extra time to steady himself when upright.    Ambulation/Gait Ambulation/Gait assistance: Contact guard assist, +2 safety/equipment (Chair Follow) Gait Distance (Feet): 8 Feet (1x8, seated rest, 1x4, seated rest) Assistive device: Rolling walker (2 wheels) Gait Pattern/deviations: Step-to pattern, Shuffle, Trunk flexed, Wide base of support, Decreased step length - right, Decreased step length - left Gait velocity: reduced Gait velocity interpretation: <1.31 ft/sec, indicative of household ambulator   General Gait Details: Pt ambulated with short steps, no foot clearence, and even weight shift. He kept trunk flex towards RW and WBOS for increased stability. Unresponsive to [facility] for corrections. Pt experienced DOE, cued on PLB techniques, and would take a prolonged seated rest break.   Stairs             Wheelchair Mobility     Tilt Bed    Modified Rankin (Stroke Patients Only)       Balance Overall balance assessment: Needs assistance Sitting-balance support: Feet supported, No upper extremity supported Sitting balance-Leahy Scale: Fair Sitting balance - Comments: Pt sat EOB with supervision.   Standing balance support: Bilateral upper extremity supported, During functional activity, Reliant on assistive device for balance Standing  balance-Leahy Scale: Poor Standing balance comment: Pt dependent on RW.                            Communication Communication Communication: Impaired Factors Affecting  Communication: Hearing impaired  Cognition Arousal: Alert Behavior During Therapy: Flat affect   PT - Cognitive impairments: No apparent impairments                         Following commands: Intact      Cueing Cueing Techniques: Verbal cues  Exercises General Exercises - Lower Extremity Long Arc Quad: Seated, Both, 10 reps, Strengthening (with 5 second hold at top) Hip Flexion/Marching: Seated, Both, 10 reps, Strengthening    General Comments General comments (skin integrity, edema, etc.): Pt c/o "fuzzyheaded" feeling whenever upright, his BP: supine 119/63 (79), seated 125/62 (80), seated following BLE exercises 130/61 (79), post-ambulation 119/66 (81). Pt was greeted on 3L O2 but requires 6L during mobility to maintain SpO2 >88%.      Pertinent Vitals/Pain Pain Assessment Pain Assessment: Faces Faces Pain Scale: Hurts little more Pain Location: Abdomen Pain Descriptors / Indicators: Discomfort, Heaviness Pain Intervention(s): Monitored during session, Limited activity within patient's tolerance    Home Living                          Prior Function            PT Goals (current goals can now be found in the care plan section) Acute Rehab PT Goals Patient Stated Goal: I want to see inside the bathroom. Progress towards PT goals: Progressing toward goals    Frequency    Min 2X/week      PT Plan      Co-evaluation              AM-PAC PT "6 Clicks" Mobility   Outcome Measure  Help needed turning from your back to your side while in a flat bed without using bedrails?: A Lot Help needed moving from lying on your back to sitting on the side of a flat bed without using bedrails?: A Lot Help needed moving to and from a bed to a chair (including a wheelchair)?: A Little Help needed standing up from a chair using your arms (e.g., wheelchair or bedside chair)?: A Little Help needed to walk in hospital room?: Total Help needed climbing 3-5  steps with a railing? : Total 6 Click Score: 12    End of Session Equipment Utilized During Treatment: Gait belt;Oxygen Activity Tolerance: Patient limited by fatigue Patient left: in chair;with call bell/phone within reach;with chair alarm set Nurse Communication: Mobility status PT Visit Diagnosis: History of falling (Z91.81);Muscle weakness (generalized) (M62.81);Unsteadiness on feet (R26.81);Difficulty in walking, not elsewhere classified (R26.2)     Time: 8469-6295 PT Time Calculation (min) (ACUTE ONLY): 24 min  Charges:    $Gait Training: 23-37 mins PT General Charges $$ ACUTE PT VISIT: 1 Visit                     Cheri Guppy, PT, DPT Acute Rehabilitation Services Office: [phone] Secure Chat Preferred  Richardson Chiquito 07/02/2023, 1:08 PM

## 2023-07-02 NOTE — Care Management Important Message (Signed)
 Important Message  Patient Details  Name: Chad Hogan MRN: [id number] Date of Birth: 1940-11-16   Important Message Given:  Yes - Medicare IM     Renie Ora 07/02/2023, 9:51 AM

## 2023-07-02 NOTE — Progress Notes (Signed)
 Patient Name: Chad Hogan Date of Encounter: 07/02/2023  [facility] Cardiologist: Sherryl Manges, MD   Interval Summary  .    Weak abdomen still distended   Vital Signs .    Vitals:   07/01/23 2000 07/02/23 0033 07/02/23 0306 07/02/23 0825  BP: 112/63 122/61 119/63   Pulse: 71 67 70   Resp: 20 19 (!) 21   Temp: 98 F (36.7 C) (!) 97.4 F (36.3 C) 97.7 F (36.5 C)   TempSrc: Oral Oral Oral   SpO2: 94% 94% 94% 90%  Weight:   111.9 kg   Height:        Intake/Output Summary (Last 24 hours) at 07/02/2023 0957 Last data filed at 07/02/2023 0950 Gross per 24 hour  Intake 660 ml  Output 400 ml  Net 260 ml      07/02/2023    3:06 AM 06/30/2023    4:25 AM 06/29/2023    4:14 AM  Last 3 Weights  Weight (lbs) 246 lb 9.6 oz 241 lb 6.5 oz 250 lb 12.8 oz  Weight (kg) 111.857 kg 109.5 kg 113.762 kg      Telemetry/ECG    Sinus rhythm heart rates in 70s- Personally Reviewed  CV Studies    Echocardiogram 06/22/2023 1. Left ventricular ejection fraction, by estimation, is 55%. The left  ventricle has normal function. Left ventricular endocardial border not  optimally defined to evaluate regional wall motion. Left ventricular  diastolic parameters are consistent with  Grade I diastolic dysfunction (impaired relaxation).   2. D-shaped septum suggests a degree of RV pressure/volume overload.  Right ventricular systolic function is normal. The right ventricular size  is moderately enlarged. Tricuspid regurgitation signal is inadequate for  assessing PA pressure.   3. The mitral valve was not well visualized. No evidence of mitral valve  regurgitation. No evidence of mitral stenosis.   4. The aortic valve was not well visualized. Aortic valve regurgitation  is not visualized. No aortic stenosis is present.   5. IVC not well-visualized.   6. Technically very difficult study. No structure was well-visualized. I  get the impression that the LV function is normal but there  is a degree of  RV dysfunction.   Physical Exam .   GEN: Ill-appearing, on supplemental oxygen, slight cough Neck: No JVD Cardiac: RRR, no murmurs, rubs, or gallops.  Respiratory: Diffuse crackles, improved GI: Large firm distended abdomen, dullness to percussion worse on the left side MS: 1-2+ pitting edema with wraps   Patient Profile    Chad Hogan is a 83 y.o. male has hx of  HTN, HLD, CAD, history of PE, CKD stage 3b, liver cirrhosis, history of SVT, WPW, COPD, HFpEF, OSA on CPAP, RCC.  Assessment & Plan .     Acute on chronic HFpEF Liver cirrhosis Renal Cell Carcinoma -3/17 status post paracentesis 5 L, metolazone 2.5 mg once   Cr up a bit 2.94 K 3.7  Lasix 80 mg iv bid drip stopped 06/30/23  I ordered repeat abdominal US last night and still has moderate/severe ascites Would plan on repeat paracentesis Initial fluid negative cytology for renal cell cancer   History of VT with loss of preexcitation Status post incomplete ablation of left lateral accessory pathway 03/2016 No acute arrhythmias here, continue with verapamil 240 mg twice daily  CKD Worsening renal function, creatinine 2.94 Lasix drip changed to bid iv In setting of un RX renal cell carcinoma  Bacterial PNA Acute on chronic respiratory failure  COPD OSA Liver cirrhosis RCC Per primary Team  Would consider getting renal, GI involved as well as palliative care Needs outpatient oncology evaluation for renal cell cancer   For questions or updates, please contact [facility] Please consult [email or web address] for contact info under        Signed, Charlton Haws, MD

## 2023-07-03 DIAGNOSIS — R188 Other ascites: Secondary | ICD-10-CM

## 2023-07-03 DIAGNOSIS — I10 Essential (primary) hypertension: Secondary | ICD-10-CM | POA: Diagnosis not present

## 2023-07-03 DIAGNOSIS — J189 Pneumonia, unspecified organism: Secondary | ICD-10-CM | POA: Diagnosis not present

## 2023-07-03 DIAGNOSIS — I5033 Acute on chronic diastolic (congestive) heart failure: Secondary | ICD-10-CM | POA: Diagnosis not present

## 2023-07-03 DIAGNOSIS — J449 Chronic obstructive pulmonary disease, unspecified: Secondary | ICD-10-CM | POA: Diagnosis not present

## 2023-07-03 LAB — BASIC METABOLIC PANEL
Anion gap: 9 (ref 5–15)
BUN: 65 mg/dL — ABNORMAL HIGH (ref 8–23)
CO2: 28 mmol/L (ref 22–32)
Calcium: 7.7 mg/dL — ABNORMAL LOW (ref 8.9–10.3)
Chloride: 95 mmol/L — ABNORMAL LOW (ref 98–111)
Creatinine, Ser: 3.12 mg/dL — ABNORMAL HIGH (ref 0.61–1.24)
GFR, Estimated: 19 mL/min — ABNORMAL LOW (ref >60)
Glucose, Bld: 97 mg/dL (ref 70–99)
Potassium: 3.6 mmol/L (ref 3.5–5.1)
Sodium: 132 mmol/L — ABNORMAL LOW (ref 135–145)

## 2023-07-03 LAB — CULTURE, BODY FLUID W GRAM STAIN -BOTTLE: Culture: NO GROWTH

## 2023-07-03 LAB — MAGNESIUM: Magnesium: 2 mg/dL (ref 1.7–2.4)

## 2023-07-03 MED ORDER — FUROSEMIDE 10 MG/ML IJ SOLN
80.0000 mg | Freq: Two times a day (BID) | INTRAMUSCULAR | Status: DC
  Administered 2023-07-03 – 2023-07-04 (×3): 80 mg via INTRAVENOUS
  Filled 2023-07-03 (×5): qty 8

## 2023-07-03 NOTE — Progress Notes (Signed)
 Brief Note:   Status post paracentesis 07/02/2023, 10 L removed yellow-colored.  Telemetry reviewed which notes sinus rhythm without ectopy or sustained arrhythmias.  Patient has liver cirrhosis with recurrent ascites he has undergone paracentesis on 06/28/2023 with 5 L removal and again on 07/02/2023 requiring 10 L of removal.  Likely exacerbated by the working diagnosis of renal cell carcinoma.  Spoke to attending physician who plans to continue IV diuretics with close monitoring of renal function.  Given his poor nutritional status and low albumin unlikely to have third spacing leading to ascites and volume retention.  No additional cardiovascular testing is warranted at this time.    Palliative care consult is appropriate.  Based on my discussion with the attending physician family considering residential hospice.  Will continue to follow the patient peripherally over the weekend reach out if any questions or concerns arise in the interim. Formal rounds on Monday 07/05/2023  Chart reviewed.  No charge.  Tessa Lerner, DO, [facility]  [facility]  [address] [place], [place] [address]

## 2023-07-03 NOTE — Progress Notes (Addendum)
 Progress Note   Patient: Chad Hogan ZOX:[id number] DOB: [birth year] DOA: 06/21/2023     11 DOS: the patient was seen and examined on 07/03/2023   Brief hospital course: Mr. Beem was admitted to the hospital with the working diagnosis of heart failure decompensation.   82 yom w/ HTN HLD, CAD, PE, CKD stage IIIb, renal cell cancer who presented to ED w/ complaints of dizziness, fatigue, lightheadedness, difficulty ambulating and dyspnea.  Progressively worsening edema of his lower extremities and abdomen over the last week , treated at his nursing home for recurrent pneumonia with multiple rounds of antibiotics without improvement. On his initial physical examination his blood pressure was 120/69, HR 73, RR 16 and 02 saturation 97%.  Lungs with decreased breath sounds and increased work of breathing, heart with S1 and S2 present and regular, no gallops, abdomen distended with ascites, positive lower extremity edema +++  Na 139, K 4.3 CL 103 bicarbonate 23, glucose 125, bun 34 cr 1,65  AST 54, ALT 30  BNP 291  High sensitive troponin 11 and 9  Wbc 6,9 hgb 12,9 plt 144  Sars covid 19 negative  Influenza negative   Chest radiograph with hypoinflation, cardiomegaly, with bilateral hilar vascular congestion, bilateral cephalization of the vasculature, small bilateral pleural effusions.  EKG 74 bpm, normal axis, normal intervals qtc 450, sinus rhythm with 1st degree AV block, no significant ST segment changes and no significant T wave changes, positive low voltage.   03/17 paracentesis 5.0 L removed.  03/20 patient has responded well to diuresis, plan to transfer to SNF.  03/21 paracentesis with 10 L removed.  03/22 worsening renal function, recurrent ascites and advanced heart failure, patient with prognosis. Discussed with his daughter at the bedside, she agrees in checking if he will be candidate for residential hospice.    Assessment and Plan: * Acute on chronic diastolic CHF (congestive  heart failure) (HCC) Echocardiogram with preserved LV systolic function with EF 55%, D shaped septum with RV pressure and volume overload, RV systolic function preserved, moderate RV enlargement,   Urine output 1,500  ml Systolic blood pressure 111 mmHg range.   Continue verapamil, considering worsening renal function will stop spironolactone and resume loop diuretic with furosemide 80 mg IV bid.   Essential hypertension Continue blood pressure control with verapamil  CAP (community acquired pneumonia) Patient had 5 days of appropriate antibiotic therapy.   Acute kidney injury superimposed on stage 3b chronic kidney disease (HCC) Hypokalemia. Hyponatremia.   Continue worsening renal function, patient continue volume overloaded.  Will resume diuresis with furosemide 80 mg IV bid.  Hold on spironolactone.   Patient very weak and deconditioned, progressive multiorgan failure.  Likely not candidate for renal replacement therapy.   COPD (chronic obstructive pulmonary disease) (HCC) No signs of acute exacerbation Continue bronchodilator therapy and supplemental 02 per [place].  He is on home 02 3 L/min   Obesity, class 1 Calculated BMI is 33,6   Anxiety Continue with alprazolam.   Liver cirrhosis (HCC) CT 02/2023 with positive cirrhosis.   Worsening ascites, with portal hypertension. Poor prognosis.  03/17 paracentesis 5 L  03/21 paracentesis 10 L  plus IV albumin 75 gr   Poor prognosis.  Renal cell carcinoma (HCC) Locally advanced right sided renal cell carcinoma.   2024 November CT  Enlarging heterogeneous mass lower pole right kidney consistent with progressive renal cell carcinoma. This mass now extends into right renal pelvis and renal sinus fat without evidence of extension into the renal  vasculature.  Downstream soft tissue density within the distal right ureter protruding through the right UVJ, which may reflect distal urothelial neoplasm, metastasis of blood clot.   Enlarging heterogenous soft tissue mesenteric mass anterior to the duodenum.   Reviewed oncology note from 04/2023, pending PET scan.  Possible palliative therapy.   Considering his current condition doubt he will be candidate for further cancer therapy.  May benefit from further imaging, will follow up with oncology. Dr Melvyn Neth.   Hypothyroidism Continue with levothyroxine.       Subjective: Patient very weak and deconditioned, positive dyspnea, improved abdominal distention, poor mobility   Physical Exam: Vitals:   07/03/23 0046 07/03/23 0458 07/03/23 0759 07/03/23 0806  BP: (!) 96/43 (!) 107/49  (!) 111/53  Pulse: 65 68  72  Resp: 20 20  18   Temp: 98.2 F (36.8 C) (!) 97.2 F (36.2 C)  (!) 97.3 F (36.3 C)  TempSrc: Oral Oral  Oral  SpO2: 91% 97% 94% 98%  Weight:  110.9 kg    Height:       Neurology awake and alert, deconditioned and ill looking appearing ENT with positive pallor with no icterus,  Cardiovascular with S1 and S2 present and regular with no gallops, positive systolic murmur at the right lower sternal border Respiratory with rales bilaterally with scattered rhonchi with no wheezing Abdomen distended but not tender Positive lower extremity edema pitting ++  Data Reviewed:    Family Communication: I spoke with patient's daughter at the bedside, we talked in detail about patient's condition, plan of care and prognosis and all questions were addressed. Patient with poor prognosis, worsening despite aggressive medical therapy, will consult TOC for possible residential hospice.   Disposition: Status is: Inpatient Remains inpatient appropriate because: diuresis   Planned Discharge Destination: Skilled nursing facility     Author: Coralie Keens, MD 07/03/2023 11:14 AM  For on call review [email or web address].

## 2023-07-03 NOTE — Plan of Care (Signed)

## 2023-07-03 NOTE — TOC Progression Note (Addendum)
 Transition of Care [facility]) - Progression Note    Patient Details  Name: Chad Hogan MRN: [id number] Date of Birth: [birth year]  Transition of [facility]) CM/SW Contact  Patrice Paradise, LCSW Phone Number: 07/03/2023, 12:03 PM  Clinical Narrative:     Patient's authorization is still pending.  12;40 PM-  insurance is offering a peer to peer to be done by Monday 3/24, call [phone] opt 5.    TOC team will continue to assist with discharge planning needs.  Expected Discharge Plan: Skilled Nursing Facility Barriers to Discharge: Continued Medical Work up, SNF Pending bed offer, English as a second language teacher  Expected Discharge Plan and Services In-house Referral: Clinical Social Work Discharge Planning Services: CM Consult Post Acute Care Choice: Home Health Living arrangements for the past 2 months: Single Family Home                 DME Arranged: N/A DME Agency: NA       HH Arranged: RN, PT, OT, Nurse's Aide HH Agency: [facility] Date [facility] Agency Contacted: 06/25/23 Time HH Agency Contacted: 1238 Representative spoke with at [facility] Agency: Dewayne Hatch   Social Determinants of Health (SDOH) Interventions SDOH Screenings   Food Insecurity: No Food Insecurity (06/22/2023)  Housing: Low Risk  (06/22/2023)  Transportation Needs: No Transportation Needs (06/22/2023)  Utilities: Not At Risk (06/22/2023)  Depression (PHQ2-9): Low Risk  (04/20/2023)  Social Connections: Socially Isolated (06/22/2023)  Tobacco Use: High Risk (06/22/2023)    Readmission Risk Interventions    06/25/2023   12:29 PM  Readmission Risk Prevention Plan  Transportation Screening Complete  PCP or Specialist Appt within 5-7 Days Complete  Home Care Screening Complete  Medication Review (RN CM) Complete

## 2023-07-04 DIAGNOSIS — J189 Pneumonia, unspecified organism: Secondary | ICD-10-CM | POA: Diagnosis not present

## 2023-07-04 DIAGNOSIS — I10 Essential (primary) hypertension: Secondary | ICD-10-CM | POA: Diagnosis not present

## 2023-07-04 DIAGNOSIS — N179 Acute kidney failure, unspecified: Secondary | ICD-10-CM | POA: Diagnosis not present

## 2023-07-04 DIAGNOSIS — I509 Heart failure, unspecified: Secondary | ICD-10-CM | POA: Diagnosis not present

## 2023-07-04 DIAGNOSIS — Z66 Do not resuscitate: Secondary | ICD-10-CM

## 2023-07-04 DIAGNOSIS — K7460 Unspecified cirrhosis of liver: Secondary | ICD-10-CM | POA: Diagnosis not present

## 2023-07-04 DIAGNOSIS — I5033 Acute on chronic diastolic (congestive) heart failure: Secondary | ICD-10-CM | POA: Diagnosis not present

## 2023-07-04 DIAGNOSIS — C649 Malignant neoplasm of unspecified kidney, except renal pelvis: Secondary | ICD-10-CM | POA: Diagnosis not present

## 2023-07-04 DIAGNOSIS — R531 Weakness: Secondary | ICD-10-CM | POA: Diagnosis not present

## 2023-07-04 LAB — BASIC METABOLIC PANEL
Anion gap: 11 (ref 5–15)
BUN: 69 mg/dL — ABNORMAL HIGH (ref 8–23)
CO2: 27 mmol/L (ref 22–32)
Calcium: 7.9 mg/dL — ABNORMAL LOW (ref 8.9–10.3)
Chloride: 95 mmol/L — ABNORMAL LOW (ref 98–111)
Creatinine, Ser: 2.73 mg/dL — ABNORMAL HIGH (ref 0.61–1.24)
GFR, Estimated: 22 mL/min — ABNORMAL LOW (ref >60)
Glucose, Bld: 103 mg/dL — ABNORMAL HIGH (ref 70–99)
Potassium: 3.3 mmol/L — ABNORMAL LOW (ref 3.5–5.1)
Sodium: 133 mmol/L — ABNORMAL LOW (ref 135–145)

## 2023-07-04 LAB — MAGNESIUM: Magnesium: 2.1 mg/dL (ref 1.7–2.4)

## 2023-07-04 MED ORDER — POTASSIUM CHLORIDE CRYS ER 20 MEQ PO TBCR
40.0000 meq | EXTENDED_RELEASE_TABLET | Freq: Once | ORAL | Status: AC
  Administered 2023-07-04: 40 meq via ORAL
  Filled 2023-07-04: qty 2

## 2023-07-04 NOTE — Progress Notes (Signed)
 Progress Note   Patient: Chad Hogan:[id number] DOB: 06/17/1940 DOA: 06/21/2023     12 DOS: the patient was seen and examined on 07/04/2023   Brief hospital course: Chad Hogan was admitted to the hospital with the working diagnosis of heart failure decompensation.   82 yom w/ HTN HLD, CAD, PE, CKD stage IIIb, renal cell cancer who presented to ED w/ complaints of dizziness, fatigue, lightheadedness, difficulty ambulating and dyspnea.  Progressively worsening edema of his lower extremities and abdomen over the last week , treated at his nursing home for recurrent pneumonia with multiple rounds of antibiotics without improvement. On his initial physical examination his blood pressure was 120/69, HR 73, RR 16 and 02 saturation 97%.  Lungs with decreased breath sounds and increased work of breathing, heart with S1 and S2 present and regular, no gallops, abdomen distended with ascites, positive lower extremity edema +++  Na 139, K 4.3 CL 103 bicarbonate 23, glucose 125, bun 34 cr 1,65  AST 54, ALT 30  BNP 291  High sensitive troponin 11 and 9  Wbc 6,9 hgb 12,9 plt 144  Sars covid 19 negative  Influenza negative   Chest radiograph with hypoinflation, cardiomegaly, with bilateral hilar vascular congestion, bilateral cephalization of the vasculature, small bilateral pleural effusions.  EKG 74 bpm, normal axis, normal intervals qtc 450, sinus rhythm with 1st degree AV block, no significant ST segment changes and no significant T wave changes, positive low voltage.   03/17 paracentesis 5.0 L removed.  03/20 patient has responded well to diuresis, plan to transfer to SNF.  03/21 paracentesis with 10 L removed.  03/22 worsening renal function, recurrent ascites and advanced heart failure, patient with prognosis. Discussed with his daughter at the bedside, she agrees in checking if he will be candidate for residential hospice.  03/23 continue conservative supportive medical therapy.    Assessment  and Plan: * Acute on chronic diastolic CHF (congestive heart failure) (HCC) Echocardiogram with preserved LV systolic function with EF 55%, D shaped septum with RV pressure and volume overload, RV systolic function preserved, moderate RV enlargement,   Pulmonary hypertension Acute on chronic core pulmonale.   Urine output 1,250  ml Systolic blood pressure 111 mmHg range.   Continue verapamil, and loop diuretic with furosemide 80 mg IV bid for diuresis.   Essential hypertension Continue blood pressure control with verapamil  CAP (community acquired pneumonia) Patient had 5 days of appropriate antibiotic therapy.   Acute kidney injury superimposed on stage 3b chronic kidney disease (HCC) Hypokalemia. Hyponatremia.   Renal function today with serum cr at 2,73 with K at 3,3 and serum bicarbonate at 27  Na 133 Mg 2.1    Add 40 meq Kcl  Follow up renal panel and electrolytes in am.  Patient very weak and deconditioned, progressive multiorgan failure.  Likely not candidate for renal replacement therapy.   Liver cirrhosis (HCC) CT 02/2023 with positive cirrhosis.   Worsening ascites, with portal hypertension. Poor prognosis.  03/17 paracentesis 5 L  03/21 paracentesis 10 L  plus IV albumin 75 gr   Poor prognosis.  Renal cell carcinoma (HCC) Locally advanced right sided renal cell carcinoma.   2024 November CT  Enlarging heterogeneous mass lower pole right kidney consistent with progressive renal cell carcinoma. This mass now extends into right renal pelvis and renal sinus fat without evidence of extension into the renal vasculature.  Downstream soft tissue density within the distal right ureter protruding through the right UVJ, which may reflect  distal urothelial neoplasm, metastasis of blood clot.  Enlarging heterogenous soft tissue mesenteric mass anterior to the duodenum.   Reviewed oncology note from 04/2023, pending PET scan.  Possible palliative therapy.   Considering  his current condition doubt he will be candidate for further cancer therapy.  May benefit from further imaging, will follow up with oncology. Dr Melvyn Neth.  Will check if can send cytology on ascitic fluid.   COPD (chronic obstructive pulmonary disease) (HCC) No signs of acute exacerbation Continue bronchodilator therapy and supplemental 02 per .  He is on home 02 3 L/min   Hypothyroidism Continue with levothyroxine.   Anxiety Continue with alprazolam.   Obesity, class 1 Calculated BMI is 33,6       Subjective: Patient with mild improvement in dyspnea today, continue very weak and deconditioned. No chest pain   Physical Exam: Vitals:   07/04/23 0532 07/04/23 0600 07/04/23 0700 07/04/23 0820  BP: (!) 94/53 101/65  (!) 101/47  Pulse: 65 66  61  Resp: 16   18  Temp: 97.8 F (36.6 C)   97.7 F (36.5 C)  TempSrc: Oral  Oral Axillary  SpO2: 94% 92%  94%  Weight:  101.4 kg    Height:       Neurology awake and alert, deconditioned and ill looking appearing ENT with positive pallor with no icterus Cardiovascular with S1 and S2 present and regular with no gallops or rubs. No murmurs Respiratory with rales bilaterally with no wheezing or rhonchi Abdomen distended but not tender Positive lower extremity edema ++ pitting.  Data Reviewed:    Family Communication: no family at the bedside   Disposition: Status is: Inpatient Remains inpatient appropriate because: volume overload on IV furosemide   Planned Discharge Destination:  to be determined      Author: Coralie Keens, MD 07/04/2023 11:05 AM  For on call review [email or web address].

## 2023-07-04 NOTE — Plan of Care (Signed)

## 2023-07-04 NOTE — Plan of Care (Signed)

## 2023-07-04 NOTE — Progress Notes (Signed)
 Patient ID: Chad Hogan, male   DOB: 03-31-1941, 83 y.o.   MRN: [id number]    Progress Note from the Palliative Medicine Team at [facility]   Patient Name: Chad Hogan        Date: 07/04/2023 DOB: [birth year]  Age: 83 y.o. MRN#: [id number] Attending Physician: Coralie Keens Primary Care Physician: Street, [name], MD Admit Date: 06/21/2023   Reason for Consultation/Follow-up   Establishing Goals of Care   HPI/ Brief Hospital Review  83 y.o. male   admitted on 06/21/2023 with past medical history of HTN, HLD, CAD, history of PE, CKD stage 3b, liver cirrhosis, history of SVT, WPW, COPD, HFpEF, OSA on CPAP, RCC/diagnosed in 2017. (According to daughter patient refused further workup)  S/p paracentesis X2  Patient has had continued physical and functional decline over the past many months   Patient and his family face treatment option decisions, advanced directive decisions and anticipatory care needs.      Subjective  Extensive chart review has been completed prior to meeting with patient/family  including labs, vital signs, imaging, progress/consult notes, orders, medications and available advance directive documents.    This NP assessed patient at the bedside as a follow up for palliative medicine needs and emotional support.   Patient appears weaker to me today.  He tells me he is tired from sitting OOB in the chair for a few hours     I spoke with patient's daughter/[name] by telephone for ongoing discussion regarding goals of care.  Again a detailed discussion was had with patient/daughter regarding the seriousness of his current medical situation specific to his multiple co-morbidities.  Patient's progress has been  limited during this  hospitalization secondary to progression of underlying disease.  Patient has declined work up for underlying renal cell cancer.   Plan of care: -DNR/DNI-documented today -continue current medical  support -Disposition-daughter tells me that she has spoken directly with [name] in [place] and they tell her they have a bed available for her father. [facility] is for short term rehab, long term disposition dependant on outcomes.  Education offered today regarding  the importance of continued conversation with family and their  medical providers regarding overall plan of care and treatment options,  ensuring decisions are within the context of the patients values and GOCs.  I expressed my concern for his high risk of further decompensation .   Questions and concerns addressed     Time: 25  minutes  Detailed review of medical records ( labs, imaging, vital signs), medically appropriate exam ( MS, skin, cardiac,  resp)   discussed with treatment team, counseling and education to patient, family, staff, documenting clinical information, medication management, coordination of care    Lorinda Creed NP  Palliative Medicine Team Team Phone # [phone] Pager [phone]

## 2023-07-05 ENCOUNTER — Inpatient Hospital Stay (HOSPITAL_COMMUNITY)

## 2023-07-05 DIAGNOSIS — K7460 Unspecified cirrhosis of liver: Secondary | ICD-10-CM | POA: Diagnosis not present

## 2023-07-05 DIAGNOSIS — I5033 Acute on chronic diastolic (congestive) heart failure: Secondary | ICD-10-CM | POA: Diagnosis not present

## 2023-07-05 DIAGNOSIS — I10 Essential (primary) hypertension: Secondary | ICD-10-CM | POA: Diagnosis not present

## 2023-07-05 DIAGNOSIS — R531 Weakness: Secondary | ICD-10-CM | POA: Diagnosis not present

## 2023-07-05 DIAGNOSIS — N179 Acute kidney failure, unspecified: Secondary | ICD-10-CM | POA: Diagnosis not present

## 2023-07-05 DIAGNOSIS — C649 Malignant neoplasm of unspecified kidney, except renal pelvis: Secondary | ICD-10-CM | POA: Diagnosis not present

## 2023-07-05 DIAGNOSIS — N1832 Chronic kidney disease, stage 3b: Secondary | ICD-10-CM | POA: Diagnosis not present

## 2023-07-05 LAB — CBC
HCT: 36 % — ABNORMAL LOW (ref 39.0–52.0)
Hemoglobin: 12.2 g/dL — ABNORMAL LOW (ref 13.0–17.0)
MCH: 29.2 pg (ref 26.0–34.0)
MCHC: 33.9 g/dL (ref 30.0–36.0)
MCV: 86.1 fL (ref 80.0–100.0)
Platelets: 172 10*3/uL (ref 150–400)
RBC: 4.18 MIL/uL — ABNORMAL LOW (ref 4.22–5.81)
RDW: 16.8 % — ABNORMAL HIGH (ref 11.5–15.5)
WBC: 9.1 10*3/uL (ref 4.0–10.5)
nRBC: 0 % (ref 0.0–0.2)

## 2023-07-05 LAB — BASIC METABOLIC PANEL
Anion gap: 8 (ref 5–15)
BUN: 74 mg/dL — ABNORMAL HIGH (ref 8–23)
CO2: 26 mmol/L (ref 22–32)
Calcium: 7.9 mg/dL — ABNORMAL LOW (ref 8.9–10.3)
Chloride: 97 mmol/L — ABNORMAL LOW (ref 98–111)
Creatinine, Ser: 2.98 mg/dL — ABNORMAL HIGH (ref 0.61–1.24)
GFR, Estimated: 20 mL/min — ABNORMAL LOW (ref >60)
Glucose, Bld: 101 mg/dL — ABNORMAL HIGH (ref 70–99)
Potassium: 4.2 mmol/L (ref 3.5–5.1)
Sodium: 131 mmol/L — ABNORMAL LOW (ref 135–145)

## 2023-07-05 MED ORDER — TORSEMIDE 20 MG PO TABS
40.0000 mg | ORAL_TABLET | Freq: Every day | ORAL | Status: DC
  Administered 2023-07-05 – 2023-07-06 (×2): 40 mg via ORAL
  Filled 2023-07-05 (×2): qty 2

## 2023-07-05 NOTE — Progress Notes (Signed)
 Patient Name: Chad Hogan Date of Encounter: 07/05/2023 [facility] Cardiologist: Chad Manges, MD   Interval Summary  .    BP 106/59.  Net -160 cc, -6.4 L on admission.  Creatinine stable (2.9 > 3.1 > 2.7 > 3.0).  He reports dyspnea improved but remains short of breath Weight down 31lbs on admission.  Vital Signs .    Vitals:   07/05/23 0436 07/05/23 0441 07/05/23 0713 07/05/23 0803  BP:  (!) 106/56 (!) 106/59   Pulse:  63 77   Resp:  17 18   Temp:  (!) 97.2 F (36.2 C) (!) 97.5 F (36.4 C)   TempSrc: Oral Axillary Oral   SpO2:  96% 97% 97%  Weight:      Height:        Intake/Output Summary (Last 24 hours) at 07/05/2023 0940 Last data filed at 07/05/2023 0436 Gross per 24 hour  Intake 840 ml  Output 1000 ml  Net -160 ml      07/04/2023    6:00 AM 07/03/2023    4:58 AM 07/02/2023    3:06 AM  Last 3 Weights  Weight (lbs) 223 lb 9.6 oz 244 lb 7.8 oz 246 lb 9.6 oz  Weight (kg) 101.424 kg 110.9 kg 111.857 kg      Telemetry/ECG    Sinus rhythm heart rates in 70s- Personally Reviewed  CV Studies    Echocardiogram 06/22/2023 1. Left ventricular ejection fraction, by estimation, is 55%. The left  ventricle has normal function. Left ventricular endocardial border not  optimally defined to evaluate regional wall motion. Left ventricular  diastolic parameters are consistent with  Grade I diastolic dysfunction (impaired relaxation).   2. D-shaped septum suggests a degree of RV pressure/volume overload.  Right ventricular systolic function is normal. The right ventricular size  is moderately enlarged. Tricuspid regurgitation signal is inadequate for  assessing PA pressure.   3. The mitral valve was not well visualized. No evidence of mitral valve  regurgitation. No evidence of mitral stenosis.   4. The aortic valve was not well visualized. Aortic valve regurgitation  is not visualized. No aortic stenosis is present.   5. IVC not well-visualized.   6.  Technically very difficult study. No structure was well-visualized. I  get the impression that the LV function is normal but there is a degree of  RV dysfunction.   Physical Exam .   GEN: Ill-appearing, on supplemental oxygen, Neck: No JVD Cardiac: RRR, no murmurs, rubs, or gallops.  Respiratory: Crackles at R base GI: soft, nontender MS:  wraps around both legs  Patient Profile    Chad Hogan is a 83 y.o. male with HTN, HLD, CAD, history of PE, CKD stage 3b, liver cirrhosis, history of SVT, WPW, COPD, HFpEF, OSA on CPAP, RCC who we are consulted for evaluation of diastolic heart failure  Assessment & Plan .     Acute on chronic diastolic heart failure: Echocardiogram with EF 55%, normal RV function (but poorly visualized), moderate RV enlargement.  Presented with significant volume overload.  Has diuresed well and underwent paracentesis x2, weight is down 31lbs -Transition to PO torsemide today  AKI on CKD stage IIIb: Creatinine 3.0 today.  Monitor with diuresis  Cirrhosis: CT concerning for cirrhosis.  Has undergone paracentesis x 2 this admission.    SVT: Status post incomplete ablation of the left lateral accessory pathway.  On verapamil  Goals of care: Palliative consulted  For questions or updates, please contact  [facility] Please consult [email or web address] for contact info under        Signed, Chad Ishikawa, MD

## 2023-07-05 NOTE — Progress Notes (Signed)
 Physical Therapy Treatment Patient Details Name: Chad Hogan MRN: [id number] DOB: [birth year] Today's Date: 07/05/2023   History of Present Illness 83 y.o. male admitted 3/10 with fatigue, LB edema, SOB with acute on chronic HFpEF. PMhx: HTN, HLD, CAD, PE, CKD, COPD (3L O2 at baseline), OSA, Wolff-Parkinson-white syndrome, liver cirrhosis    PT Comments  Pt engaged in BLE exercises to maintain ROM and strength while supine in bed. Unable to progress OOB mobility this date d/t symptomatic orthostatic hypotension, RN notified. Patient will benefit from continued inpatient follow up therapy, <3 hours/day. Will continue to follow acutely and advance appropriately.   Vitals Sitting EOB: BP 87/57 (67), HR 85, SpO2 93% on 3L Supine: BP 111/63 (75) Sitting EOB: BP 83/49 (60) Supine: BP 96/62 (73), HR 77, SpO2 95% at 3L        If plan is discharge home, recommend the following: Help with stairs or ramp for entrance;Assistance with cooking/housework;Assist for transportation;A lot of help with walking and/or transfers;A lot of help with bathing/dressing/bathroom   Can travel by private vehicle     Yes  Equipment Recommendations  Wheelchair (measurements PT);Wheelchair cushion (measurements PT)    Recommendations for Other Services       Precautions / Restrictions Precautions Precautions: Fall Recall of Precautions/Restrictions: Intact Restrictions Weight Bearing Restrictions Per Provider Order: No     Mobility  Bed Mobility Overal bed mobility: Needs Assistance Bed Mobility: Supine to Sit, Sit to Supine     Supine to sit: HOB elevated, Used rails, Contact guard Sit to supine: Contact guard assist   General bed mobility comments: Pt sat up on R side of bed with increased time. HOB elevated ~25deg, use of bedrail, pulled on PT's hand, and CGA at trunk. Returned to supine with CGA to bring BLE back into bed. Repositioned 2+ assist using bed features and bed pad.     Transfers Overall transfer level: Needs assistance Equipment used: Rolling walker (2 wheels) Transfers: Sit to/from Stand Sit to Stand: From elevated surface, Contact guard assist           General transfer comment: Pt stood from slightly raised bed height with CGA to power up. Worsening "fuzzyheaded feeling". Good eccentric control with sitting.    Ambulation/Gait               General Gait Details: Deferred d/t symptomatic orthostatic hypotension   Stairs             Wheelchair Mobility     Tilt Bed    Modified Rankin (Stroke Patients Only)       Balance Overall balance assessment: Needs assistance Sitting-balance support: Feet supported, No upper extremity supported Sitting balance-Leahy Scale: Fair Sitting balance - Comments: Pt sat EOB with supervision.   Standing balance support: Bilateral upper extremity supported, During functional activity, Reliant on assistive device for balance Standing balance-Leahy Scale: Poor Standing balance comment: Pt dependent on RW.                            Communication Communication Communication: Impaired Factors Affecting Communication: Hearing impaired  Cognition Arousal: Alert Behavior During Therapy: Flat affect   PT - Cognitive impairments: No apparent impairments                       PT - Cognition Comments: Pt A,Ox4 Following commands: Intact      Cueing Cueing Techniques: Verbal cues  Exercises General Exercises -  Lower Extremity Ankle Circles/Pumps: Supine, Both, 10 reps, AROM Quad Sets: Supine, Both, 10 reps, Strengthening (hold for 10 seconds) Gluteal Sets: Supine, Both, 10 reps, Strengthening (hold for 10 seconds) Heel Slides: Supine, Both, 10 reps, AROM Hip ABduction/ADduction: Supine, Both, 10 reps, AROM Straight Leg Raises: Supine, Both, 10 reps, Strengthening    General Comments General comments (skin integrity, edema, etc.): Pt c/o "fuzzyheaded feeling" with  position change. Sitting EOB: BP 87/57 (67), HR 85, SpO2 93% on 3L. Returned to Supine: BP 111/63 (75). Pt engaged in LE exercises to raise BP and attempted to sit up again BP 83/49 (60). Returned to supine: BP 96/62 (73), HR 77, SpO2 95% at 3L. Pt's BLE are bandaged. His LUE has increased swelling and 1+ pitting edema.      Pertinent Vitals/Pain Pain Assessment Pain Assessment: No/denies pain    Home Living                          Prior Function            PT Goals (current goals can now be found in the care plan section) Acute Rehab PT Goals Patient Stated Goal: Go to rehab Progress towards PT goals: Progressing toward goals    Frequency    Min 2X/week      PT Plan      Co-evaluation              AM-PAC PT "6 Clicks" Mobility   Outcome Measure  Help needed turning from your back to your side while in a flat bed without using bedrails?: A Lot Help needed moving from lying on your back to sitting on the side of a flat bed without using bedrails?: A Lot Help needed moving to and from a bed to a chair (including a wheelchair)?: A Little Help needed standing up from a chair using your arms (e.g., wheelchair or bedside chair)?: A Little Help needed to walk in hospital room?: Total Help needed climbing 3-5 steps with a railing? : Total 6 Click Score: 12    End of Session Equipment Utilized During Treatment: Oxygen Activity Tolerance: Patient limited by fatigue;Treatment limited secondary to medical complications (Comment) (symptomatic orthostatic hypotension) Patient left: in bed;with call bell/phone within reach;with chair alarm set Nurse Communication: Mobility status;Other (comment) (BP readings - symptomatic orthostatic hypotension & increased LUE swelling/edema.) PT Visit Diagnosis: History of falling (Z91.81);Muscle weakness (generalized) (M62.81);Unsteadiness on feet (R26.81);Difficulty in walking, not elsewhere classified (R26.2)     Time:  1610-9604 PT Time Calculation (min) (ACUTE ONLY): 35 min  Charges:    $Therapeutic Exercise: 8-22 mins $Therapeutic Activity: 8-22 mins PT General Charges $$ ACUTE PT VISIT: 1 Visit                     Cheri Guppy, PT, DPT Acute Rehabilitation Services Office: [phone] Secure Chat Preferred  Richardson Chiquito 07/05/2023, 2:20 PM

## 2023-07-05 NOTE — TOC Progression Note (Addendum)
 Transition of Care [facility]) - Progression Note    Patient Details  Name: Chad Hogan MRN: [id number] Date of Birth: March 06, 1941  Transition of [facility]) CM/SW Contact  Michaela Corner, [place] Phone Number: 07/05/2023, 11:41 AM  Clinical Narrative:  Palliative following. CSW notified MD of peer to peer and provided the following information: Call [phone], opt 5; patients name, DOB, and member ID. MD to complete peer to peer at this time.   12:31 PM MD completed peer to peer, auth approved 07/02/2023-07/06/2023. CSW notified facility and patients dtr, Chad Hogan. Per MD, patient will potentially be ready to DC tomorrow.   TOC will continue to follow.    Expected Discharge Plan: Skilled Nursing Facility Barriers to Discharge: Continued Medical Work up, SNF Pending bed offer, English as a second language teacher  Expected Discharge Plan and Services In-house Referral: Clinical Social Work Discharge Planning Services: CM Consult Post Acute Care Choice: Home Health Living arrangements for the past 2 months: Single Family Home                 DME Arranged: N/A DME Agency: NA       HH Arranged: RN, PT, OT, Nurse's Aide HH Agency: [facility] Date [facility] Agency Contacted: 06/25/23 Time HH Agency Contacted: 1238 Representative spoke with at [facility] Agency: Dewayne Hatch   Social Determinants of Health (SDOH) Interventions SDOH Screenings   Food Insecurity: No Food Insecurity (06/22/2023)  Housing: Low Risk  (06/22/2023)  Transportation Needs: No Transportation Needs (06/22/2023)  Utilities: Not At Risk (06/22/2023)  Depression (PHQ2-9): Low Risk  (04/20/2023)  Social Connections: Socially Isolated (06/22/2023)  Tobacco Use: High Risk (06/22/2023)    Readmission Risk Interventions    06/25/2023   12:29 PM  Readmission Risk Prevention Plan  Transportation Screening Complete  PCP or Specialist Appt within 5-7 Days Complete  Home Care Screening Complete  Medication Review (RN CM) Complete

## 2023-07-05 NOTE — Progress Notes (Addendum)
 Progress Note   Patient: Chad Hogan XBJ:[id number] DOB: 09/08/1940 DOA: 06/21/2023     13 DOS: the patient was seen and examined on 07/05/2023   Brief hospital course: Mr. Likes was admitted to the hospital with the working diagnosis of heart failure decompensation.   82 yom w/ HTN HLD, CAD, PE, CKD stage IIIb, renal cell cancer who presented to ED w/ complaints of dizziness, fatigue, lightheadedness, difficulty ambulating and dyspnea.  Progressively worsening edema of his lower extremities and abdomen over the last week , treated at his nursing home for recurrent pneumonia with multiple rounds of antibiotics without improvement. On his initial physical examination his blood pressure was 120/69, HR 73, RR 16 and 02 saturation 97%.  Lungs with decreased breath sounds and increased work of breathing, heart with S1 and S2 present and regular, no gallops, abdomen distended with ascites, positive lower extremity edema +++  Na 139, K 4.3 CL 103 bicarbonate 23, glucose 125, bun 34 cr 1,65  AST 54, ALT 30  BNP 291  High sensitive troponin 11 and 9  Wbc 6,9 hgb 12,9 plt 144  Sars covid 19 negative  Influenza negative   Chest radiograph with hypoinflation, cardiomegaly, with bilateral hilar vascular congestion, bilateral cephalization of the vasculature, small bilateral pleural effusions.  EKG 74 bpm, normal axis, normal intervals qtc 450, sinus rhythm with 1st degree AV block, no significant ST segment changes and no significant T wave changes, positive low voltage.   03/17 paracentesis 5.0 L removed.  03/20 patient has responded well to diuresis, plan to transfer to SNF.  03/21 paracentesis with 10 L removed.  03/22 worsening renal function, recurrent ascites and advanced heart failure, patient with prognosis. Discussed with his daughter at the bedside, she agrees in checking if he will be candidate for residential hospice.  03/23 continue conservative supportive medical therapy.  03/24 patient  more stable today, transitioned to oral diuretic therapy, possible transfer to SNF tomorrow. Will add palliative care.    Assessment and Plan: * Acute on chronic diastolic CHF (congestive heart failure) (HCC) Echocardiogram with preserved LV systolic function with EF 55%, D shaped septum with RV pressure and volume overload, RV systolic function preserved, moderate RV enlargement,   Pulmonary hypertension Acute on chronic core pulmonale.   Urine output 1,100  ml Systolic blood pressure 111 mmHg range.   Continue verapamil, transitioned to torsemide 40 mg.  Limited medical therapy due to risk of hypotension and advance liver disease.  Check vascular US left upper extremity for local edema.   Acute kidney injury superimposed on stage 3b chronic kidney disease (HCC) Hypokalemia. Hyponatremia.   Volume status has improved, renal function today with serum cr at 2,98 with K at 4,2 and serum bicarbonate at 26  Na 131   Follow up renal function and electrolytes in am.  Avoid hypotension and nephrotoxic agents.   Liver cirrhosis (HCC) CT 02/2023 with positive cirrhosis.   Worsening ascites, with portal hypertension. Poor prognosis.  03/17 paracentesis 5 L  03/21 paracentesis 10 L  plus IV albumin 75 gr   Poor prognosis.  Essential hypertension Continue blood pressure control with verapamil  Renal cell carcinoma (HCC) Locally advanced right sided renal cell carcinoma.   2024 November CT  Enlarging heterogeneous mass lower pole right kidney consistent with progressive renal cell carcinoma. This mass now extends into right renal pelvis and renal sinus fat without evidence of extension into the renal vasculature.  Downstream soft tissue density within the distal right ureter  protruding through the right UVJ, which may reflect distal urothelial neoplasm, metastasis of blood clot.  Enlarging heterogenous soft tissue mesenteric mass anterior to the duodenum.   Reviewed oncology note  from 04/2023, pending PET scan.  Possible palliative therapy.   Considering his current condition doubt he will be candidate for further cancer therapy.  03.18 fluid analysis with no malignant cells.  Follow up with Dr Melvyn Neth as outpatient.   COPD (chronic obstructive pulmonary disease) (HCC) No signs of acute exacerbation Continue bronchodilator therapy and supplemental 02 per [place].  He is on home 02 3 L/min   Hypothyroidism Continue with levothyroxine.   Anxiety Continue with alprazolam.   CAP (community acquired pneumonia) Patient had 5 days of appropriate antibiotic therapy.   Obesity, class 1 Calculated BMI is 33,6       Subjective: Patient is feeling better, edema has improved, continue very weak and deconditioned not back to baseline   Physical Exam: Vitals:   07/05/23 0441 07/05/23 0713 07/05/23 0803 07/05/23 1051  BP: (!) 106/56 (!) 106/59  113/60  Pulse: 63 77  83  Resp: 17 18  20   Temp: (!) 97.2 F (36.2 C) (!) 97.5 F (36.4 C)  97.8 F (36.6 C)  TempSrc: Axillary Oral  Oral  SpO2: 96% 97% 97% 96%  Weight:      Height:       Neurology awake and alert ENT with mild pallor Cardiovascular with S1 and S2 present and regular, positive systolic murmur at the apex No JVD Lower extremity edema +  Respiratory with scattered rales with no wheezing Abdomen with mild distention but non tender and no signs of recurrent ascites.   Data Reviewed:    Family Communication: no family at the bedside.  I spoke with patient's daughter at the bedside, we talked in detail about patient's condition, plan of care and prognosis and all questions were addressed.'   Disposition: Status is: Inpatient Remains inpatient appropriate because: Possible transfer to SNF tomorrow   Planned Discharge Destination: Skilled nursing facility     Author: Coralie Keens, MD 07/05/2023 2:18 PM  For on call review [email or web address].

## 2023-07-05 NOTE — Progress Notes (Signed)
 Patient ID: Chad Hogan, male   DOB: Apr 21, 1940, 83 y.o.   MRN: [id number]    Progress Note from the Palliative Medicine Team at [facility]   Patient Name: Chad Hogan        Date: 07/05/2023 DOB: July 29, 1940  Age: 83 y.o. MRN#: [id number] Attending Physician: Coralie Keens Primary Care Physician: Street, [name], MD Admit Date: 06/21/2023   Reason for Consultation/Follow-up   Establishing Goals of Care   HPI/ Brief Hospital Review  83 y.o. male   admitted on 06/21/2023 with past medical history of HTN, HLD, CAD, history of PE, CKD stage 3b, liver cirrhosis, history of SVT, WPW, COPD, HFpEF, OSA on CPAP, RCC/diagnosed in 2017. (According to daughter patient refused further workup)  S/p paracentesis X2  Patient has had continued physical and functional decline over the past many months   Patient and his family face treatment option decisions, advanced directive decisions and anticipatory care needs.   Subjective  Extensive chart review has been completed prior to meeting with patient/family  including labs, vital signs, imaging, progress/consult notes, orders, medications and available advance directive documents.    This NP assessed patient at the bedside as a follow up for palliative medicine needs and emotional support.   Patient appears weaker to me today.  He is anticipating dc to SNF for rehab     I spoke with patient's daughter/Chad Hogan by telephone for ongoing discussion regarding goals of care.  Again a detailed discussion was had with patient/daughter regarding the seriousness of his current medical situation specific to his multiple co-morbidities.  I expressed my concern regarding patient's overall failure to thrive, ongoing weakness, reoccurring abdominal ascites, poor nutritional status and high risk for continued decompensation  Patient's progress has been  limited during this  hospitalization secondary to progression of underlying  disease.  Patient has declined work up for underlying renal cell cancer.   Education offered on hospice benefit, encouraged [facility] [place] to consider this approach to care.        Strongly encouraged patient to consider his goals of care into the next week and months specific to artificial feeding and hydration, IV antibiotic use and rehospitalization considering the patient does not want further workup or treatment for his underlying renal cell cancer.  Plan of care: -DNR/DNI-documented today -continue current medical support -Disposition-SNF for short-term rehab.  Strongly recommend outpatient palliative services with conversion to hospice when patient is excepting.  I believe patient is eligible for the hospice benefit currently with a prognosis of less than 6 months. Education offered on hospice benefit;    Education offered today regarding  the importance of continued conversation with family and their  medical providers regarding overall plan of care and treatment options,  ensuring decisions are within the context of the patients values and GOCs.  I expressed my concern for his high risk of further decompensation .   Questions and concerns addressed     Discussed in detail with Dr Ilda Foil   Time: 50  minutes  Detailed review of medical records ( labs, imaging, vital signs), medically appropriate exam ( MS, skin, cardiac,  resp)   discussed with treatment team, counseling and education to patient, family, staff, documenting clinical information, medication management, coordination of care    Lorinda Creed NP  Palliative Medicine Team Team Phone # [phone] Pager [phone]

## 2023-07-06 ENCOUNTER — Inpatient Hospital Stay (HOSPITAL_COMMUNITY)

## 2023-07-06 DIAGNOSIS — N1832 Chronic kidney disease, stage 3b: Secondary | ICD-10-CM | POA: Diagnosis not present

## 2023-07-06 DIAGNOSIS — K7460 Unspecified cirrhosis of liver: Secondary | ICD-10-CM | POA: Diagnosis not present

## 2023-07-06 DIAGNOSIS — N179 Acute kidney failure, unspecified: Secondary | ICD-10-CM | POA: Diagnosis not present

## 2023-07-06 DIAGNOSIS — I5033 Acute on chronic diastolic (congestive) heart failure: Secondary | ICD-10-CM | POA: Diagnosis not present

## 2023-07-06 DIAGNOSIS — R609 Edema, unspecified: Secondary | ICD-10-CM | POA: Diagnosis not present

## 2023-07-06 DIAGNOSIS — I10 Essential (primary) hypertension: Secondary | ICD-10-CM | POA: Diagnosis not present

## 2023-07-06 LAB — NA AND K (SODIUM & POTASSIUM), RAND UR
Potassium Urine: 35 mmol/L
Sodium, Ur: <10 mmol/L

## 2023-07-06 LAB — BASIC METABOLIC PANEL
Anion gap: 10 (ref 5–15)
BUN: 78 mg/dL — ABNORMAL HIGH (ref 8–23)
CO2: 26 mmol/L (ref 22–32)
Calcium: 8.1 mg/dL — ABNORMAL LOW (ref 8.9–10.3)
Chloride: 96 mmol/L — ABNORMAL LOW (ref 98–111)
Creatinine, Ser: 3.18 mg/dL — ABNORMAL HIGH (ref 0.61–1.24)
GFR, Estimated: 19 mL/min — ABNORMAL LOW (ref >60)
Glucose, Bld: 114 mg/dL — ABNORMAL HIGH (ref 70–99)
Potassium: 4.2 mmol/L (ref 3.5–5.1)
Sodium: 132 mmol/L — ABNORMAL LOW (ref 135–145)

## 2023-07-06 MED ORDER — MIDODRINE HCL 5 MG PO TABS
5.0000 mg | ORAL_TABLET | Freq: Three times a day (TID) | ORAL | Status: DC
  Administered 2023-07-06 – 2023-07-07 (×2): 5 mg via ORAL
  Filled 2023-07-06 (×2): qty 1

## 2023-07-06 MED ORDER — VERAPAMIL HCL ER 240 MG PO TBCR
240.0000 mg | EXTENDED_RELEASE_TABLET | Freq: Every day | ORAL | Status: DC
  Administered 2023-07-06 – 2023-07-07 (×2): 240 mg via ORAL
  Filled 2023-07-06 (×2): qty 1

## 2023-07-06 NOTE — TOC Progression Note (Signed)
 Transition of Care [facility]) - Progression Note    Patient Details  Name: Chad Hogan MRN: [id number] Date of Birth: [birth year]  [facility] CM/SW Contact  Michaela Corner, [place] Phone Number: 07/06/2023, 1:07 PM  Clinical Narrative:   CSW called [employer]. Per representative, patient has until 3/29 at 11:59 PM to discharge to [name].   TOC will continue to follow.    Expected Discharge Plan: Skilled Nursing Facility Barriers to Discharge: Continued Medical Work up, SNF Pending bed offer, English as a second language teacher  Expected Discharge Plan and Services In-house Referral: Clinical Social Work Discharge Planning Services: CM Consult Post Acute Care Choice: Home Health Living arrangements for the past 2 months: Single Family Home                 DME Arranged: N/A DME Agency: NA       HH Arranged: RN, PT, OT, Nurse's Aide HH Agency: [facility] Date [facility] Agency Contacted: 06/25/23 Time HH Agency Contacted: 1238 Representative spoke with at [facility] Agency: Dewayne Hatch   Social Determinants of Health (SDOH) Interventions SDOH Screenings   Food Insecurity: No Food Insecurity (06/22/2023)  Housing: Low Risk  (06/22/2023)  Transportation Needs: No Transportation Needs (06/22/2023)  Utilities: Not At Risk (06/22/2023)  Depression (PHQ2-9): Low Risk  (04/20/2023)  Social Connections: Socially Isolated (06/22/2023)  Tobacco Use: High Risk (06/22/2023)    Readmission Risk Interventions    06/25/2023   12:29 PM  Readmission Risk Prevention Plan  Transportation Screening Complete  PCP or Specialist Appt within 5-7 Days Complete  Home Care Screening Complete  Medication Review (RN CM) Complete

## 2023-07-06 NOTE — Plan of Care (Signed)
   Problem: Clinical Measurements: Goal: Respiratory complications will improve Outcome: Progressing   Problem: Activity: Goal: Risk for activity intolerance will decrease Outcome: Progressing

## 2023-07-06 NOTE — Progress Notes (Signed)
 Progress Note   Patient: Chad Hogan:[id number] DOB: [birth year] DOA: 06/21/2023     14 DOS: the patient was seen and examined on 07/06/2023   Brief hospital course: Chad Hogan was admitted to the hospital with the working diagnosis of heart failure decompensation.   82 yom w/ HTN HLD, CAD, PE, CKD stage IIIb, renal cell cancer who presented to ED w/ complaints of dizziness, fatigue, lightheadedness, difficulty ambulating and dyspnea.  Progressively worsening edema of his lower extremities and abdomen over the last week , treated at his nursing home for recurrent pneumonia with multiple rounds of antibiotics without improvement. On his initial physical examination his blood pressure was 120/69, HR 73, RR 16 and 02 saturation 97%.  Lungs with decreased breath sounds and increased work of breathing, heart with S1 and S2 present and regular, no gallops, abdomen distended with ascites, positive lower extremity edema +++  Na 139, K 4.3 CL 103 bicarbonate 23, glucose 125, bun 34 cr 1,65  AST 54, ALT 30  BNP 291  High sensitive troponin 11 and 9  Wbc 6,9 hgb 12,9 plt 144  Sars covid 19 negative  Influenza negative   Chest radiograph with hypoinflation, cardiomegaly, with bilateral hilar vascular congestion, bilateral cephalization of the vasculature, small bilateral pleural effusions.  EKG 74 bpm, normal axis, normal intervals qtc 450, sinus rhythm with 1st degree AV block, no significant ST segment changes and no significant T wave changes, positive low voltage.   03/17 paracentesis 5.0 L removed.  03/20 patient has responded well to diuresis, plan to transfer to SNF.  03/21 paracentesis with 10 L removed.  03/22 worsening renal function, recurrent ascites and advanced heart failure, patient with prognosis. Discussed with his daughter at the bedside, she agrees in checking if he will be candidate for residential hospice.  03/23 continue conservative supportive medical therapy.  03/24 patient  more stable today, transitioned to oral diuretic therapy, possible transfer to SNF tomorrow. Will add palliative care.  03/25 worsening renal function, consulting nephrology.    Assessment and Plan: * Acute on chronic diastolic CHF (congestive heart failure) (HCC) Echocardiogram with preserved LV systolic function with EF 55%, D shaped septum with RV pressure and volume overload, RV systolic function preserved, moderate RV enlargement,   Pulmonary hypertension Acute on chronic core pulmonale.   Urine output 400  ml Systolic blood pressure 111 mmHg range.   Continue verapamil, transitioned to torsemide 40 mg.  Limited medical therapy due to risk of hypotension and advance liver disease.  Left upper extremity doppler US negative for deep vein thrombosis.   Acute kidney injury superimposed on stage 3b chronic kidney disease (HCC) Hypokalemia. Hyponatremia.   Patient continue with signs of volume overload, his urine output documented is only 400 cc Today serum cr is up to 3,18 with K at 4,2 and serum bicarbonate at 26  Na 132.   I am concerned patient is developing hepatorenal syndrome.  Check urinary electrolytes and will consult nephrology for further recommendations.   Follow up renal function and electrolytes in am.   Liver cirrhosis (HCC) CT 02/2023 with positive cirrhosis.   Worsening ascites, with portal hypertension. Poor prognosis.  03/17 paracentesis 5 L  03/21 paracentesis 10 L  plus IV albumin 75 gr   Poor prognosis.  Essential hypertension Continue blood pressure control with verapamil  Renal cell carcinoma (HCC) Locally advanced right sided renal cell carcinoma.   2024 November CT  Enlarging heterogeneous mass lower pole right kidney consistent with progressive  renal cell carcinoma. This mass now extends into right renal pelvis and renal sinus fat without evidence of extension into the renal vasculature.  Downstream soft tissue density within the distal right  ureter protruding through the right UVJ, which may reflect distal urothelial neoplasm, metastasis of blood clot.  Enlarging heterogenous soft tissue mesenteric mass anterior to the duodenum.   Reviewed oncology note from 04/2023, pending PET scan.  Possible palliative therapy.   Considering his current condition doubt he will be candidate for further cancer therapy.  03.18 fluid analysis with no malignant cells.  Follow up with Dr Melvyn Neth as outpatient.   COPD (chronic obstructive pulmonary disease) (HCC) No signs of acute exacerbation Continue bronchodilator therapy and supplemental 02 per [place].  He is on home 02 3 L/min   Hypothyroidism Continue with levothyroxine.   Anxiety Continue with alprazolam.   CAP (community acquired pneumonia) Patient had 5 days of appropriate antibiotic therapy.   Obesity, class 1 Calculated BMI is 33,6       Subjective: Patient very weak and deconditioned, dyspnea and edema have been improving but not completely resolved.   Physical Exam: Vitals:   07/06/23 0522 07/06/23 0523 07/06/23 0749 07/06/23 1141  BP:   (!) 110/56 (!) 102/58  Pulse:   68 70  Resp: 20 (!) 21 19 20   Temp:   (!) 97.4 F (36.3 C) (!) 97.3 F (36.3 C)  TempSrc:   Oral Oral  SpO2:   93% 92%  Weight:      Height:       Neurology awake and alert, deconditioned and ill looking appearing ENT with mild pallor Cardiovascular with S1 and S2 present and regular with no gallops, rubs or murmurs Respiratory with positive bilateral rales at bases with poor inspiratory effort with no wheezing or rhonchi Abdomen with distention, dull to percussion at the dependent zones. Positive lower extremity edema pitting +  Data Reviewed:    Family Communication: no family at the bedside   Disposition: Status is: Inpatient Remains inpatient appropriate because: worsening renal function   Planned Discharge Destination: Skilled nursing facility      Author: Coralie Keens,  MD 07/06/2023 12:45 PM  For on call review [email or web address].

## 2023-07-06 NOTE — Progress Notes (Signed)
 Patient Name: Chad Hogan Date of Encounter: 07/06/2023 [facility] Cardiologist: Sherryl Manges, MD   Interval Summary  .    BP 110/56.  Recorded as net positive 315cc yesterday but had unmeasured UOP.  Weight stable from yesterday, down 31 lbs on admission. Creatinine trending up ( 2.7 > 3.0 >3.2).  Reports dyspnea improved  Vital Signs .    Vitals:   07/06/23 0521 07/06/23 0522 07/06/23 0523 07/06/23 0749  BP: (!) 110/54   (!) 110/56  Pulse:    68  Resp: (!) 21 20 (!) 21 19  Temp: 98 F (36.7 C)   (!) 97.4 F (36.3 C)  TempSrc: Oral   Oral  SpO2:    93%  Weight: 101.6 kg     Height:        Intake/Output Summary (Last 24 hours) at 07/06/2023 0934 Last data filed at 07/06/2023 8469 Gross per 24 hour  Intake 790 ml  Output 475 ml  Net 315 ml      07/06/2023    5:21 AM 07/04/2023    6:00 AM 07/03/2023    4:58 AM  Last 3 Weights  Weight (lbs) 223 lb 14.4 oz 223 lb 9.6 oz 244 lb 7.8 oz  Weight (kg) 101.56 kg 101.424 kg 110.9 kg      Telemetry/ECG    Sinus rhythm heart rates in 70s- Personally Reviewed  CV Studies    Echocardiogram 06/22/2023 1. Left ventricular ejection fraction, by estimation, is 55%. The left  ventricle has normal function. Left ventricular endocardial border not  optimally defined to evaluate regional wall motion. Left ventricular  diastolic parameters are consistent with  Grade I diastolic dysfunction (impaired relaxation).   2. D-shaped septum suggests a degree of RV pressure/volume overload.  Right ventricular systolic function is normal. The right ventricular size  is moderately enlarged. Tricuspid regurgitation signal is inadequate for  assessing PA pressure.   3. The mitral valve was not well visualized. No evidence of mitral valve  regurgitation. No evidence of mitral stenosis.   4. The aortic valve was not well visualized. Aortic valve regurgitation  is not visualized. No aortic stenosis is present.   5. IVC not  well-visualized.   6. Technically very difficult study. No structure was well-visualized. I  get the impression that the LV function is normal but there is a degree of  RV dysfunction.   Physical Exam .   GEN: Ill-appearing, on supplemental oxygen, Neck: No JVD Cardiac: RRR, no murmurs, rubs, or gallops.  Respiratory: Crackles at R base GI: soft, nontender MS:  wraps around both legs  Patient Profile    KYNDALL CHAPLIN is a 83 y.o. male with HTN, HLD, CAD, history of PE, CKD stage 3b, liver cirrhosis, history of SVT, WPW, COPD, HFpEF, OSA on CPAP, RCC who we are consulted for evaluation of diastolic heart failure  Assessment & Plan .     Acute on chronic diastolic heart failure: Echocardiogram with EF 55%, normal RV function (but poorly visualized), moderate RV enlargement.  Presented with significant volume overload.  Has diuresed well and underwent paracentesis x2, weight is down 31lbs -Transitioned to PO torsemide   AKI on CKD stage IIIb: Creatinine trending up, 3.2 today.  D/w Dr Ella Jubilee, planning nephrology consult.  Could represent hepatorenal syndrome  Cirrhosis: CT concerning for cirrhosis.  Has undergone paracentesis x 2 this admission.    SVT: Status post incomplete ablation of the left lateral accessory pathway.  On verapamil  Goals of care: Poor prognosis given comorbidities, agree with palliative consult    For questions or updates, please contact [facility] Please consult [email or web address] for contact info under        Signed, Little Ishikawa, MD

## 2023-07-06 NOTE — Consult Note (Signed)
 [facility] Renal Consultation Note  Requesting MD: Erin Hearing, MD Indication for Consultation:  AKI  Chief complaint: shortness of breath and swelling   HPI:  KUZEY OGATA is a 83 y.o. male with a history of HTN, COPD, recent diagnosis of cirrhosis, and locally advanced renal cell carcinoma who presented to the hospital with shortness of breath and leg swelling as well as abdominal swelling.  He states he is not sure how long he has been swollen - it has built up over time.  Breathing now is better than when he came in.  He has been diuresed and has had 2 separate large-volume paracenteses - one that was 5 L (on 3/17) and another that was 10 L (on 3/21).  The diagnosis of cirrhosis is relatively recent and he is now decompensated.  Primary team is consulting nephrology as he has AKI with creatinine 3.18 now and urine output has been low.  He had 475 mL UOP over 3/24 and had 400 mL UOP charted thus far on 3/25.  Team is concerned about a poor prognosis.  He has been on verapamil and has been hypotensive.  Palliative care has seen him and he was made DNR/DNI; they note that he does not want further work-up or treatment for his underlying renal cell cancer.  He thought that he was going to a SNF today.  Usually lives at home.  I spoke with his daughter today via phone.  His daughter spoke with him previously about his kidneys and dialysis today and he told her he didn't want dialysis.   Creatinine  Date/Time Value Ref Range Status  04/20/2023 02:33 PM 1.18 0.61 - 1.24 mg/dL Final   Creatinine, Ser  Date/Time Value Ref Range Status  07/06/2023 02:48 AM 3.18 (H) 0.61 - 1.24 mg/dL Final  24/40/1027 25:36 AM 2.98 (H) 0.61 - 1.24 mg/dL Final  64/40/3474 25:95 AM 2.73 (H) 0.61 - 1.24 mg/dL Final  63/87/5643 32:95 AM 3.12 (H) 0.61 - 1.24 mg/dL Final  18/84/1660 63:01 AM 2.94 (H) 0.61 - 1.24 mg/dL Final  60/01/9322 55:73 AM 2.59 (H) 0.61 - 1.24 mg/dL Final  22/05/5425 06:23 AM 2.63  (H) 0.61 - 1.24 mg/dL Final  76/28/3151 76:16 AM 2.09 (H) 0.61 - 1.24 mg/dL Final  07/37/1062 69:48 AM 1.95 (H) 0.61 - 1.24 mg/dL Final  54/62/7035 00:93 AM 1.74 (H) 0.61 - 1.24 mg/dL Final  81/82/9937 16:96 AM 1.67 (H) 0.61 - 1.24 mg/dL Final  78/93/8101 75:10 AM 1.60 (H) 0.61 - 1.24 mg/dL Final  25/85/2778 24:23 AM 1.49 (H) 0.61 - 1.24 mg/dL Final  53/61/4431 54:00 AM 1.50 (H) 0.61 - 1.24 mg/dL Final  86/76/1950 93:26 AM 1.47 (H) 0.61 - 1.24 mg/dL Final  71/24/5809 98:33 PM 1.64 (H) 0.61 - 1.24 mg/dL Final  82/50/5397 67:34 PM 1.20 0.61 - 1.24 mg/dL Final  19/37/9024 09:73 PM 1.13 0.61 - 1.24 mg/dL Final  53/29/9242 68:34 AM 1.00 0.61 - 1.24 mg/dL Final  19/62/2297 98:92 AM 1.38 (H) 0.61 - 1.24 mg/dL Final  11/94/1740 81:44 AM 1.37 (H) 0.61 - 1.24 mg/dL Final  81/85/6314 97:02 AM 1.45 (H) 0.61 - 1.24 mg/dL Final  63/78/5885 02:77 AM 1.39 (H) 0.61 - 1.24 mg/dL Final  41/28/7867 67:20 AM 1.37 (H) 0.61 - 1.24 mg/dL Final  94/70/9628 36:62 AM 1.30 (H) 0.61 - 1.24 mg/dL Final  94/76/5465 03:54 AM 1.22 0.61 - 1.24 mg/dL Final  65/68/1275 17:00 AM 1.26 (H) 0.61 - 1.24 mg/dL Final  17/49/4496 75:91 AM 1.25 (H)  0.61 - 1.24 mg/dL Final  02/07/2535 64:40 AM 1.16 0.61 - 1.24 mg/dL Final  34/74/2595 63:87 AM 1.17 0.61 - 1.24 mg/dL Final  56/43/3295 18:84 PM 1.20 0.61 - 1.24 mg/dL Final  16/60/6301 60:10 PM 1.29 (H) 0.61 - 1.24 mg/dL Final  93/23/5573 22:02 PM 1.16 0.61 - 1.24 mg/dL Final  54/27/0623 76:28 AM 0.94 0.61 - 1.24 mg/dL Final  31/51/7616 07:37 AM 0.86 0.61 - 1.24 mg/dL Final  10/62/6948 54:62 AM 0.98 0.61 - 1.24 mg/dL Final  70/35/0093 81:82 PM 1.28 (H) 0.61 - 1.24 mg/dL Final  99/37/1696 78:93 AM 1.64 (H) 0.61 - 1.24 mg/dL Final  81/04/7508 25:85 PM 2.45 (H) 0.61 - 1.24 mg/dL Final  27/78/2423 53:61 AM 0.91 0.50 - 1.35 mg/dL Final  44/31/5400 86:76 PM 0.9 0.4 - 1.5 mg/dL Final     PMHx:   Past Medical History:  Diagnosis Date   Anxiety    CAP (community acquired pneumonia)  12/2016   CKD (chronic kidney disease) stage 3, GFR 30-59 ml/min (HCC)    COPD (chronic obstructive pulmonary disease) (HCC)    Diastolic CHF, chronic (HCC)    HLD (hyperlipidemia)    HTN (hypertension)    Ischemic heart disease    Obesity    OSA (obstructive sleep apnea)    Palpitation    Pulmonary embolism (HCC)    PVC (premature ventricular contraction)    SVT (supraventricular tachycardia) (HCC)    Umbilical hernia    WPW (Wolff-Parkinson-White syndrome)     Past Surgical History:  Procedure Laterality Date   CARDIAC CATHETERIZATION N/A 03/16/2016   Procedure: Left Heart Cath and Coronary Angiography;  Surgeon: Lennette Bihari, MD;  Location: [facility] INVASIVE CV LAB;  Service: Cardiovascular;  Laterality: N/A;   COLONOSCOPY WITH PROPOFOL N/A 06/11/2014   Procedure: COLONOSCOPY WITH PROPOFOL;  Surgeon: Louis Meckel, MD;  Location: [facility] ENDOSCOPY;  Service: Endoscopy;  Laterality: N/A;   ELECTROPHYSIOLOGIC STUDY N/A 03/23/2016   Procedure: Electrophysiology Study;  Surgeon: Marinus Maw, MD;  Location: [facility] INVASIVE CV LAB;  Service: Cardiovascular;  Laterality: N/A;   ELECTROPHYSIOLOGIC STUDY N/A 03/23/2016   Procedure: SVT Ablation;  Surgeon: Marinus Maw, MD;  Location[facility] INVASIVE CV LAB;  Service: Cardiovascular;  Laterality: N/A;   ESOPHAGOGASTRODUODENOSCOPY (EGD) WITH PROPOFOL N/A 06/11/2014   Procedure: ESOPHAGOGASTRODUODENOSCOPY (EGD) WITH PROPOFOL;  Surgeon: Louis Meckel, MD;  Location: [facility] ENDOSCOPY;  Service: Endoscopy;  Laterality: N/A;   HERNIA REPAIR     IR PARACENTESIS  06/28/2023   IR PARACENTESIS  07/02/2023    Family Hx:  Family History  Problem Relation Age of Onset   CVA Mother    Cancer Father        UNK PRIMARY   Liver cancer Other    Heart disease Other     Social History:  reports that he has quit smoking. His smokeless tobacco use includes chew. He reports that he does not drink alcohol and does not use drugs.  Allergies:  Allergies  Allergen  Reactions   Eliquis [Apixaban] Other (See Comments)    Bleeding event   Codeine Other (See Comments)    Cant remember    Medications: Prior to Admission medications   Medication Sig Start Date End Date Taking? Authorizing Provider  ALPRAZolam Prudy Feeler) 1 MG tablet Take 1 tablet (1 mg total) by mouth 2 (two) times daily as needed for anxiety. 12/30/16  Yes Lonia Blood, MD  budesonide (PULMICORT) 0.5 MG/2ML nebulizer solution Take 0.5 mg by nebulization  in the morning and at bedtime. 05/27/23  Yes [provider]  cholecalciferol (VITAMIN D3) 25 MCG (1000 UNIT) tablet Take 1,000 Units by mouth daily.   Yes [provider]  ipratropium-albuterol (DUONEB) 0.5-2.5 (3) MG/3ML SOLN Take 3 mLs by nebulization every 6 (six) hours as needed (sob). 05/14/23  Yes [provider]  levothyroxine (SYNTHROID) 25 MCG tablet Take 25 mcg by mouth daily. 05/21/23  Yes [provider]  pantoprazole (PROTONIX) 40 MG tablet Take 40 mg by mouth every morning. 05/14/23  Yes [provider]  potassium chloride SA (K-DUR,KLOR-CON) 20 MEQ tablet Take 2 tablets (40 mEq total) by mouth daily. Patient taking differently: Take 20 mEq by mouth 2 (two) times daily. 01/01/17  Yes Drema Dallas, MD  rosuvastatin (CRESTOR) 10 MG tablet Take 1 tablet (10 mg total) by mouth at bedtime. 01/01/17  Yes Drema Dallas, MD  torsemide (DEMADEX) 20 MG tablet Take 2 tablets twice daily for 5 days, then decrease to 2 tablets once daily (40mg  total) thereafter Patient taking differently: Take 20 mg by mouth 2 (two) times daily. 11/11/22  Yes [name], MD  verapamil (CALAN-SR) 240 MG CR tablet Take 1 tablet (240 mg total) by mouth 2 (two) times daily. 12/30/22  Yes [name], MD  fluticasone-salmeterol (ADVAIR) 250-50 MCG/ACT AEPB Inhale 1 puff into the lungs 2 (two) times daily. Patient not taking: Reported on 06/22/2023 09/30/21   [provider]  levETIRAcetam (KEPPRA) 500 MG  tablet Take by mouth. Patient not taking: Reported on 06/22/2023 04/27/23   [provider]  OVER THE COUNTER MEDICATION Inhale 1 application into the lungs at bedtime. CPAP with O2    [provider]   I have reviewed the patient's current and reported prior to admission medications.  Labs:     Latest Ref Rng & Units 07/06/2023    2:48 AM 07/05/2023    3:05 AM 07/04/2023    2:36 AM  BMP  Glucose 70 - 99 mg/dL 161  096  045   BUN 8 - 23 mg/dL 78  74  69   Creatinine 0.61 - 1.24 mg/dL 4.09  8.11  9.14   Sodium 135 - 145 mmol/L 132  131  133   Potassium 3.5 - 5.1 mmol/L 4.2  4.2  3.3   Chloride 98 - 111 mmol/L 96  97  95   CO2 22 - 32 mmol/L 26  26  27    Calcium 8.9 - 10.3 mg/dL 8.1  7.9  7.9     Urinalysis    Component Value Date/Time   COLORURINE YELLOW 06/21/2023 1930   APPEARANCEUR CLEAR 06/21/2023 1930   LABSPEC 1.012 06/21/2023 1930   PHURINE 5.0 06/21/2023 1930   GLUCOSEU NEGATIVE 06/21/2023 1930   HGBUR NEGATIVE 06/21/2023 1930   BILIRUBINUR NEGATIVE 06/21/2023 1930   KETONESUR NEGATIVE 06/21/2023 1930   PROTEINUR NEGATIVE 06/21/2023 1930   NITRITE NEGATIVE 06/21/2023 1930   LEUKOCYTESUR NEGATIVE 06/21/2023 1930     ROS:  Pertinent items noted in HPI and remainder of comprehensive ROS otherwise negative.  Physical Exam: Vitals:   07/06/23 1141 07/06/23 1619  BP: (!) 102/58 (!) 109/56  Pulse: 70 66  Resp: 20 (!) 21  Temp: (!) 97.3 F (36.3 C) 98.2 F (36.8 C)  SpO2: 92% 96%     General: elderly male in bed in NAD, frail appearing  HEENT: NCAT Eyes: EOMI  Neck: supple trachea midline  Heart: S1S2 no rub Lungs: crackles  and wet sounding cough; on 3 liters Abdomen: distended soft/nontender; obese  Extremities: 1+ edema lower extremities  Skin: no rash on extremities exposed; no cyanosis or clubbing  Neuro: alert and tired but conversant; provides basic hx and follows commands  Psych no anxiety or agitation   Assessment/Plan:  AKI  -  Concern for development of hepatorenal syndrome though this would be a diagnosis of exclusion.  He has been hypotensive and on aggressive diuresis so ATN and pre-renal etiologies are also possible in this gentleman.  Baseline Cr 1.2 - 1.4 - reduce verapamil - start midodrine  - pause scheduled torsemide and resume intermittent lasix as needed - check renal US - Check post-void residual bladder scan and in/out cath if retains over 300 mL.  Note confounding ascites so low threshold for in/out cath to ensure not retaining  - continue goals of care discussions.  He shared with his daughter that he would not want dialysis and, furthermore, I do not feel that he would tolerate this well  Cirrhosis  - Decompensated  - new diagnosis and s/p 2 recent large volume paracenteses as above  - low salt fluid restricted diet   HTN  - hx of such and now hypotensive - had discussed stopping verapamil with team earlier today. Will now just reduce dose and start midodrine as above  SVT - Note that on review of cardiology notes, patient with SVT s/p incomplete ablation of left lateral accessory pathway.  - reduce verapamil to 240 mg daily from BID  - Start midodrine to allow him to tolerate verapamil if this is for an arrhythmia   CKD stage 3a  - mild, baseline Cr 1.2 - 1.4  Disposition - continue inpatient monitoring and goals of care discussions    Estanislado Emms 07/06/2023, 7:30 PM

## 2023-07-06 NOTE — Progress Notes (Signed)
 PROGRESS NOTE    Chad Hogan  ZOX:[id number] DOB: 05-04-1940 DOA: 06/21/2023 PCP: Bobbye Morton, MD   83/M w/ diastolic CHF, Cirrhosis,  CAD, PE, CKD stage IIIb, renal cell cancer who presented to ED w/ complaints of dizziness, fatigue, lightheadedness, difficulty ambulating and dyspnea.  Progressively worsening edema of his lower extremities and abdomen over the last week , treated at his nursing home for recurrent pneumonia with multiple rounds of antibiotics without improvement.In Ed, VSS, abdomen distended with ascites, positive lower extremity edema +++  -bun 34 cr 1,65 AST 54, ALT 30 BNP 291 troponin 11 and 9 hgb 12,9 -CXR wcardiomegaly, with bilateral hilar vascular congestion, bilateral cephalization of the vasculature, small bilateral pleural effusions. -3/17 paracentesis 5.0 L removed.  -3/21 paracentesis with 10 L removed.  -3/22 worsening renal function, recurrent ascites and advanced heart failure, patient with prognosis. Discussed with his daughter at the bedside, she agrees in checking if he will be candidate for residential hospice.  -3/24 transitioned to oral diuretic therapy, possible transfer to SNF, palliative care consulted -3/25 worsening renal function, consulting nephrology.    Subjective: -feels poorly  Assessment and Plan:  Acute on chronic diastolic CHF (congestive heart failure) (HCC) Pulm HTN -Echocardiogram with preserved LV systolic function with EF 55%, D shaped septum with RV pressure and volume overload, RV systolic function preserved, moderate RV enlargement,  -continue verapamil, transitioned to torsemide 40 mg.  -Poor prognosis, will discuss with daughter, most appropriate for hospice  Acute kidney injury superimposed on stage 3b chronic kidney disease (HCC) Hypokalemia. Hyponatremia.  -With persistent volume overload, now worsening kidney function, concern for hepatorenal syndrome -Nephrology following, prognosis is poor, most appropriate  for hospice  Liver cirrhosis (HCC) CT 02/2023 with positive cirrhosis.  Worsening ascites, with portal hypertension. 3/17 paracentesis 5 L  3/21 paracentesis 10 L  plus IV albumin 75 gr  Poor prognosis.  Essential hypertension - verapamil  Renal cell carcinoma (HCC) Locally advanced right sided renal cell carcinoma.  2024 November CT  -Further workup deferred  COPD (chronic obstructive pulmonary disease) (HCC) No signs of acute exacerbation Continue bronchodilator therapy and supplemental 02 per [place].  He is on home 02 3 L/min   Hypothyroidism Continue with levothyroxine.   Anxiety Continue with alprazolam.   CAP (community acquired pneumonia) Patient had 5 days of appropriate antibiotic therapy.   Obesity, class 1 Calculated BMI is 33,6   DVT prophylaxis:lovenox Code Status: DNR Family Communication: None present, will attempt to contact daughter Disposition Plan:   Consultants:    Procedures:   Antimicrobials:    Objective: Vitals:   07/06/23 0749 07/06/23 0751 07/06/23 1141 07/06/23 1619  BP: (!) 110/56  (!) 102/58 (!) 109/56  Pulse: 68  70 66  Resp: 19  20 (!) 21  Temp: (!) 97.4 F (36.3 C)  (!) 97.3 F (36.3 C) 98.2 F (36.8 C)  TempSrc: Oral  Oral Oral  SpO2: 93% 94% 92% 96%  Weight:      Height:        Intake/Output Summary (Last 24 hours) at 07/06/2023 1705 Last data filed at 07/06/2023 1546 Gross per 24 hour  Intake 787 ml  Output 875 ml  Net -88 ml   Filed Weights   07/03/23 0458 07/04/23 0600 07/06/23 0521  Weight: 110.9 kg 101.4 kg 101.6 kg    Examination:  Frail cachectic chronically ill male laying in bed, AAO x 2 HEENT: Positive JVD CVS: S1-S2, regular rhythm Lungs: Decreased breath sounds at the  bases Abdomen: Firm, obese, distended, bowel sounds present Extremities: 1-2+ edema,     Data Reviewed:   CBC: Recent Labs  Lab 07/05/23 0305  WBC 9.1  HGB 12.2*  HCT 36.0*  MCV 86.1  PLT 172   Basic Metabolic  Panel: Recent Labs  Lab 07/02/23 0302 07/03/23 0234 07/04/23 0236 07/05/23 0305 07/06/23 0248  NA 133* 132* 133* 131* 132*  K 3.7 3.6 3.3* 4.2 4.2  CL 93* 95* 95* 97* 96*  CO2 26 28 27 26 26   GLUCOSE 117* 97 103* 101* 114*  BUN 58* 65* 69* 74* 78*  CREATININE 2.94* 3.12* 2.73* 2.98* 3.18*  CALCIUM 8.1* 7.7* 7.9* 7.9* 8.1*  MG 2.1 2.0 2.1  --   --    GFR: Estimated Creatinine Clearance: 21.4 mL/min (A) (by C-G formula based on SCr of 3.18 mg/dL (H)). Liver Function Tests: Recent Labs  Lab 06/30/23 0233 07/01/23 0305  AST 40 42*  ALT 27 27  ALKPHOS 163* 163*  BILITOT 0.7 0.6  PROT 5.4* 5.5*  ALBUMIN 2.6* 2.6*   No results for input(s): "LIPASE", "AMYLASE" in the last 168 hours. No results for input(s): "AMMONIA" in the last 168 hours. Coagulation Profile: No results for input(s): "INR", "PROTIME" in the last 168 hours. Cardiac Enzymes: No results for input(s): "CKTOTAL", "CKMB", "CKMBINDEX", "TROPONINI" in the last 168 hours. BNP (last 3 results) No results for input(s): "PROBNP" in the last 8760 hours. HbA1C: No results for input(s): "HGBA1C" in the last 72 hours. CBG: No results for input(s): "GLUCAP" in the last 168 hours. Lipid Profile: No results for input(s): "CHOL", "HDL", "LDLCALC", "TRIG", "CHOLHDL", "LDLDIRECT" in the last 72 hours. Thyroid Function Tests: No results for input(s): "TSH", "T4TOTAL", "FREET4", "T3FREE", "THYROIDAB" in the last 72 hours. Anemia Panel: No results for input(s): "VITAMINB12", "FOLATE", "FERRITIN", "TIBC", "IRON", "RETICCTPCT" in the last 72 hours. Urine analysis:    Component Value Date/Time   COLORURINE YELLOW 06/21/2023 1930   APPEARANCEUR CLEAR 06/21/2023 1930   LABSPEC 1.012 06/21/2023 1930   PHURINE 5.0 06/21/2023 1930   GLUCOSEU NEGATIVE 06/21/2023 1930   HGBUR NEGATIVE 06/21/2023 1930   BILIRUBINUR NEGATIVE 06/21/2023 1930   KETONESUR NEGATIVE 06/21/2023 1930   PROTEINUR NEGATIVE 06/21/2023 1930   NITRITE  NEGATIVE 06/21/2023 1930   LEUKOCYTESUR NEGATIVE 06/21/2023 1930   Sepsis Labs: @LABRCNTIP (procalcitonin:4,lacticidven:4)  ) Recent Results (from the past 240 hours)  Gram stain     Status: None   Collection Time: 06/28/23  3:21 PM   Specimen: Abdomen; Peritoneal Fluid  Result Value Ref Range Status   Specimen Description FLUID PERITONEAL ABDOMEN  Final   Special Requests NONE  Final   Gram Stain   Final    WBC PRESENT, PREDOMINANTLY MONONUCLEAR NO ORGANISMS SEEN CYTOSPIN SMEAR Performed at [facility], [address], [place], [place] [address]    Report Status 06/28/2023 FINAL  Final  Culture, body fluid w Gram Stain-bottle     Status: None   Collection Time: 06/28/23  3:21 PM   Specimen: Fluid  Result Value Ref Range Status   Specimen Description FLUID PERITONEAL ABDOMEN  Final   Special Requests BOTTLES DRAWN AEROBIC AND ANAEROBIC  Final   Culture   Final    NO GROWTH 5 DAYS Performed at [facility], [address], [place], [place] [address]    Report Status 07/03/2023 FINAL  Final     Radiology Studies: VAS [place] UPPER EXTREMITY VENOUS DUPLEX Result Date: 07/06/2023 UPPER VENOUS STUDY  Patient Name:  Chad Hogan  Date of Exam:   07/06/2023 Medical Rec #: [id number]     Accession #:    [id number] Date of Birth: 02/21/1941     Patient Gender: M Patient Age:   5 years Exam Location:  [facility] Procedure:      VAS [place] UPPER EXTREMITY VENOUS DUPLEX Referring Phys: Erin Hearing --------------------------------------------------------------------------------  Indications: Edema Risk Factors: Cancer Renal. Limitations: Patient movement. Comparison Study: None. Performing Technologist: Shona Simpson  Examination Guidelines: A complete evaluation includes B-mode imaging, spectral Doppler, color Doppler, and power Doppler as needed of all accessible portions of each vessel. Bilateral testing is considered an integral part of a complete examination. Limited  examinations for reoccurring indications may be performed as noted.  Right Findings: +----------+------------+---------+-----------+----------+-------+ RIGHT     CompressiblePhasicitySpontaneousPropertiesSummary +----------+------------+---------+-----------+----------+-------+ Subclavian    Full       Yes       Yes                      +----------+------------+---------+-----------+----------+-------+  Left Findings: +----------+------------+---------+-----------+----------+-------+ LEFT      CompressiblePhasicitySpontaneousPropertiesSummary +----------+------------+---------+-----------+----------+-------+ IJV           Full       Yes       Yes                      +----------+------------+---------+-----------+----------+-------+ Subclavian    Full       Yes       Yes                      +----------+------------+---------+-----------+----------+-------+ Axillary      Full       Yes       Yes                      +----------+------------+---------+-----------+----------+-------+ Brachial      Full       Yes       Yes                      +----------+------------+---------+-----------+----------+-------+ Radial        Full       Yes       Yes                      +----------+------------+---------+-----------+----------+-------+ Ulnar         Full       Yes       Yes                      +----------+------------+---------+-----------+----------+-------+ Cephalic      Full                 Yes                      +----------+------------+---------+-----------+----------+-------+ Basilic       Full                 Yes                      +----------+------------+---------+-----------+----------+-------+  Summary:  Right: No evidence of thrombosis in the subclavian.  Left: No evidence of deep vein thrombosis in the upper extremity. No evidence of superficial vein thrombosis in the upper extremity.  *See table(s) above for measurements and  observations.  Diagnosing physician: Joselyn Glassman  Hetty Blend Electronically signed by Carolynn Sayers on 07/06/2023 at 12:17:53 PM.    Final      Scheduled Meds:  docusate sodium  100 mg Oral BID   enoxaparin (LOVENOX) injection  30 mg Subcutaneous Daily   levothyroxine  25 mcg Oral Daily   mometasone-formoterol  2 puff Inhalation BID   pantoprazole  40 mg Oral q morning   polyethylene glycol  17 g Oral Daily   senna  1 tablet Oral QHS   torsemide  40 mg Oral Daily   Continuous Infusions:   LOS: 14 days    Time spent:    Zannie Cove, MD [facility]   07/06/2023, 5:05 PM

## 2023-07-06 NOTE — Care Management Important Message (Signed)
 Important Message  Patient Details  Name: Chad Hogan MRN: [id number] Date of Birth: [birth year]   Important Message Given:  Yes - Medicare IM     Renie Ora 07/06/2023, 10:30 AM

## 2023-07-06 NOTE — Progress Notes (Signed)
 Upper extremity venous duplex completed. Please see CV Procedures for preliminary results.  Shona Simpson, RVT 07/06/23 11:24 AM

## 2023-07-07 DIAGNOSIS — Z7189 Other specified counseling: Secondary | ICD-10-CM

## 2023-07-07 DIAGNOSIS — Z515 Encounter for palliative care: Secondary | ICD-10-CM | POA: Diagnosis not present

## 2023-07-07 DIAGNOSIS — N1832 Chronic kidney disease, stage 3b: Secondary | ICD-10-CM | POA: Diagnosis not present

## 2023-07-07 DIAGNOSIS — K7460 Unspecified cirrhosis of liver: Secondary | ICD-10-CM | POA: Diagnosis not present

## 2023-07-07 DIAGNOSIS — N179 Acute kidney failure, unspecified: Secondary | ICD-10-CM | POA: Diagnosis not present

## 2023-07-07 DIAGNOSIS — I5033 Acute on chronic diastolic (congestive) heart failure: Secondary | ICD-10-CM | POA: Diagnosis not present

## 2023-07-07 LAB — RENAL FUNCTION PANEL
Albumin: 2.6 g/dL — ABNORMAL LOW (ref 3.5–5.0)
Anion gap: 13 (ref 5–15)
BUN: 88 mg/dL — ABNORMAL HIGH (ref 8–23)
CO2: 24 mmol/L (ref 22–32)
Calcium: 8.4 mg/dL — ABNORMAL LOW (ref 8.9–10.3)
Chloride: 92 mmol/L — ABNORMAL LOW (ref 98–111)
Creatinine, Ser: 3.48 mg/dL — ABNORMAL HIGH (ref 0.61–1.24)
GFR, Estimated: 17 mL/min — ABNORMAL LOW (ref >60)
Glucose, Bld: 100 mg/dL — ABNORMAL HIGH (ref 70–99)
Phosphorus: 5.3 mg/dL — ABNORMAL HIGH (ref 2.5–4.6)
Potassium: 4 mmol/L (ref 3.5–5.1)
Sodium: 129 mmol/L — ABNORMAL LOW (ref 135–145)

## 2023-07-07 MED ORDER — HYDROMORPHONE HCL 1 MG/ML IJ SOLN: 0.5000 mg | INTRAMUSCULAR | Status: DC | PRN

## 2023-07-07 MED ORDER — LORAZEPAM 2 MG/ML IJ SOLN: 0.5000 mg | INTRAMUSCULAR | Status: DC | PRN

## 2023-07-07 MED ORDER — POLYVINYL ALCOHOL 1.4 % OP SOLN: 1.0000 [drp] | Freq: Four times a day (QID) | OPHTHALMIC | Status: DC | PRN

## 2023-07-07 MED ORDER — GLYCOPYRROLATE 1 MG PO TABS
1.0000 mg | ORAL_TABLET | ORAL | Status: DC | PRN
  Filled 2023-07-07: qty 1

## 2023-07-07 MED ORDER — HYDROMORPHONE HCL 1 MG/ML IJ SOLN
0.5000 mg | Freq: Three times a day (TID) | INTRAMUSCULAR | Status: DC
  Filled 2023-07-07: qty 0.5

## 2023-07-07 MED ORDER — GLYCOPYRROLATE 0.2 MG/ML IJ SOLN: 0.2000 mg | INTRAMUSCULAR | Status: DC | PRN

## 2023-07-07 MED ORDER — BIOTENE DRY MOUTH MT LIQD: 15.0000 mL | OROMUCOSAL | Status: DC | PRN

## 2023-07-07 NOTE — Progress Notes (Signed)
 Patient Name: Chad Hogan Date of Encounter: 07/07/2023  [facility] Cardiologist: Sherryl Manges, MD   Interval Summary  .    BP 121/55.  Net negative 500cc yesterday.  Worsening renal function (3.18 > 3.48).  He is denying any dyspnea this morning   Vital Signs .    Vitals:   07/06/23 1930 07/06/23 2042 07/07/23 0349 07/07/23 0736  BP:  (!) 104/55 (!) 98/48 (!) 121/55  Pulse:  72 69 72  Resp:  20 17 (!) 21  Temp:  97.8 F (36.6 C) (!) 97.2 F (36.2 C) (!) 97.3 F (36.3 C)  TempSrc:  Oral Oral Oral  SpO2: 97% 96% 94% 93%  Weight:   100.8 kg   Height:        Intake/Output Summary (Last 24 hours) at 07/07/2023 0904 Last data filed at 07/07/2023 0350 Gross per 24 hour  Intake 474 ml  Output 950 ml  Net -476 ml      07/07/2023    3:49 AM 07/06/2023    5:21 AM 07/04/2023    6:00 AM  Last 3 Weights  Weight (lbs) 222 lb 3.6 oz 223 lb 14.4 oz 223 lb 9.6 oz  Weight (kg) 100.8 kg 101.56 kg 101.424 kg      Telemetry/ECG    Sinus rhythm heart rates in 70s- Personally Reviewed  CV Studies    Echocardiogram 06/22/2023 1. Left ventricular ejection fraction, by estimation, is 55%. The left  ventricle has normal function. Left ventricular endocardial border not  optimally defined to evaluate regional wall motion. Left ventricular  diastolic parameters are consistent with  Grade I diastolic dysfunction (impaired relaxation).   2. D-shaped septum suggests a degree of RV pressure/volume overload.  Right ventricular systolic function is normal. The right ventricular size  is moderately enlarged. Tricuspid regurgitation signal is inadequate for  assessing PA pressure.   3. The mitral valve was not well visualized. No evidence of mitral valve  regurgitation. No evidence of mitral stenosis.   4. The aortic valve was not well visualized. Aortic valve regurgitation  is not visualized. No aortic stenosis is present.   5. IVC not well-visualized.   6. Technically very  difficult study. No structure was well-visualized. I  get the impression that the LV function is normal but there is a degree of  RV dysfunction.   Physical Exam .   GEN: Ill-appearing, on supplemental oxygen, Neck: No JVD Cardiac: RRR, no murmurs, rubs, or gallops.  Respiratory: Diminished breath sounds GI: Distended MS:  wraps around both legs  Patient Profile    Chad Hogan is a 83 y.o. male with HTN, HLD, CAD, history of PE, CKD stage 3b, liver cirrhosis, history of SVT, WPW, COPD, HFpEF, OSA on CPAP, RCC who we are consulted for evaluation of diastolic heart failure  Assessment & Plan .     Acute on chronic diastolic heart failure: Echocardiogram with EF 55%, normal RV function (but poorly visualized), moderate RV enlargement.  Presented with significant volume overload.  Has diuresed well and underwent paracentesis x2, weight is down 33lbs -Worsening renal function, nephrology following.  Holding diuretics for now.  AKI on CKD stage IIIb: Creatinine trending up, 3.5 today.  Nephrology consulted, concern for hepatorenal syndrome  Cirrhosis: CT concerning for cirrhosis.  Has undergone paracentesis x 2 this admission.    SVT: Status post incomplete ablation of the left lateral accessory pathway.  On verapamil.  Dose reduced to 240 mg daily given soft BP.  Midodrine added to improve BP  Goals of care: Poor prognosis given comorbidities, agree with palliative consult    For questions or updates, please contact [facility] Please consult [email or web address] for contact info under        Signed, Little Ishikawa, MD

## 2023-07-07 NOTE — Progress Notes (Signed)
 Palliative Medicine Inpatient Follow Up Note HPI: 83 y.o. male   admitted on 06/21/2023 with past medical history of HTN, HLD, CAD, history of PE, CKD stage 3b, liver cirrhosis, history of SVT, WPW, COPD, HFpEF, OSA on CPAP, RCC/diagnosed in 2017.   Patient has had continued physical and functional decline over the past many months.   Patient and his family face treatment option decisions, advanced directive decisions and anticipatory care needs.  Today's Discussion 07/07/2023  *Please note that this is a verbal dictation therefore any spelling or grammatical errors are due to the "Dragon Medical One" system interpretation.  Chart reviewed inclusive of vital signs, progress notes, laboratory results, and diagnostic images.   I met with Chad Hogan at bedside this late morning. Chad Hogan' daughter, Chad Hogan was present. We reviewed Chad Hogan multi-system dysfunction. We discussed his heart failure, CKD, and liver cirrhosis. We reviewed the relationship between these organ systems and the long term concerns associated with it. I shared my worry in the setting of Chad Hogan' declining functional status and how this related to the larger picture.  Chad Hogan shares that last night he fell asleep and has hoped that he would not awaken. He was hoping that he would be taken from this earth peacefully in the night. I created space and opportunity for patient to explore thoughts feelings and fears regarding current medical situation.  We discussed options moving forward one option is to be agressive and continue present measures or change our focus to more of a comfort mediated emphasis.   We talked about transition to comfort measures in house and what that would entail inclusive of medications to control pain, dyspnea, agitation, nausea, itching, and hiccups.  We discussed stopping all uneccessary measures such as paracentesis, cardiac monitoring, blood draws, needle sticks, and frequent vital signs.   I shared I suspect  that Chad Hogan time would be limited if we move towards comfort though he would have dignity and peace as he transitions from this life to the next.   Chad Hogan and his daughter are in agreement with comfort measures. Chad Hogan would like to transition to the hospice home in [facility].   I shared that I would start some low dose medications around the clock to help with symptom burden. I alerted both Chad Hogan and his daughter that this may make him more sleepy which we has in agreement with.   Questions and concerns addressed/Palliative Support Provided.   Objective Assessment: Vital Signs Vitals:   07/07/23 0349 07/07/23 0736  BP: (!) 98/48 (!) 121/55  Pulse: 69 72  Resp: 17 (!) 21  Temp: (!) 97.2 F (36.2 C) (!) 97.3 F (36.3 C)  SpO2: 94% 93%    Intake/Output Summary (Last 24 hours) at 07/07/2023 1436 Last data filed at 07/07/2023 1300 Gross per 24 hour  Intake 337 ml  Output 1100 ml  Net -763 ml   Last Weight  Most recent update: 07/07/2023  3:50 AM    Weight  100.8 kg (222 lb 3.6 oz)            Gen:  Elderly Caucasian M in NAD HEENT: moist mucous membranes CV: Regular rate and rhythm  PULM: On 3LPM [place], breathing is even and nonlabored  ABD: soft/nontender  EXT: (+) RUE edema, (+) muscle wasting Neuro: Alert and oriented x3   SUMMARY OF RECOMMENDATIONS   DNAR/DNI  Comfort focused care  Initiate low dose dilaudid Q8H ATC  Additional comfort medications per [facility]  Plan for transition to [facility]  Ongoing PMT support  Time Spent: 9 Billing based on MDM: High ______________________________________________________________________________________ Chad Hogan  Palliative Medicine Team Team Cell Phone: [phone] Please utilize secure chat with additional questions, if there is no response within 30 minutes please call the above phone number  Palliative Medicine Team providers are available by phone from 7am to 7pm daily and can be  reached through the team cell phone.  Should this patient require assistance outside of these hours, please call the patient's attending physician.

## 2023-07-07 NOTE — Plan of Care (Signed)
 Patient is transitioning to comfort care.  Nephrology will sign off.  Appreciate all teams caring for the patient. Please do not hesitate to reach out with any questions  Estanislado Emms, MD 12:50 PM 07/07/2023

## 2023-07-07 NOTE — Progress Notes (Signed)
 PT Cancellation Note  Patient Details Name: Chad Hogan MRN: [id number] DOB: December 29, 1940   Cancelled Treatment:    Reason Eval/Treat Not Completed: Other (comment) (Per Palliative, pt now comfort care. Will sign off.)  Cheri Guppy, PT, DPT Acute Rehabilitation Services Office: [phone] Secure Chat Preferred  Richardson Chiquito 07/07/2023, 11:55 AM

## 2023-07-07 NOTE — TOC Transition Note (Signed)
 Transition of Care [facility]) - Discharge Note   Patient Details  Name: Chad Hogan MRN: [id number] Date of Birth: [birth year]  [facility] CM/SW Contact:  Michaela Corner, LCSWA Phone Number: 07/07/2023, 3:43 PM   Clinical Narrative:   [facility] Anticipated DC date: 07/07/23  Family notified: Patina (dtr) Transport by: Sharin Mons   Per MD patient ready for DC to [facility]. RN to call report prior to discharge [phone]). RN, patient, patient's family, and facility notified of DC. Discharge Summary and FL2 sent to facility. DC packet on chart. Ambulance transport requested for patient at 4:04 PM.   CSW will sign off for now as social work intervention is no longer needed. Please consult [place] again if new needs arise.   Final next level of care: Hospice Medical Facility Barriers to Discharge: Barriers Resolved   Patient Goals and CMS Choice Patient states their goals for this hospitalization and ongoing recovery are:: get better CMS Medicare.gov Compare Post Acute Care list provided to:: Patient Represenative (must comment) (daughter) Choice offered to / list presented to : Adult Children      Discharge Placement              Patient chooses bed at: Other - please specify in the comment section below: [facility]) Patient to be transferred to facility by: [facility]tar Name of family member notified: Patina (dtr) Patient and family notified of of transfer: 07/07/23  Discharge Plan and Services Additional resources added to the After Visit Summary for   In-house Referral: Clinical Social Work Discharge Planning Services: CM Consult Post Acute Care Choice: Home Health          DME Arranged: N/A DME Agency: NA       HH Arranged: RN, PT, OT, Nurse's Aide HH Agency: [facility] Date [facility] Agency Contacted: 06/25/23 Time HH Agency Contacted: 1238 Representative spoke with at [facility] Agency: Dewayne Hatch  Social  Drivers of Health (SDOH) Interventions SDOH Screenings   Food Insecurity: No Food Insecurity (06/22/2023)  Housing: Low Risk  (06/22/2023)  Transportation Needs: No Transportation Needs (06/22/2023)  Utilities: Not At Risk (06/22/2023)  Depression (PHQ2-9): Low Risk  (04/20/2023)  Social Connections: Socially Isolated (06/22/2023)  Tobacco Use: High Risk (06/22/2023)     Readmission Risk Interventions    06/25/2023   12:29 PM  Readmission Risk Prevention Plan  Transportation Screening Complete  PCP or Specialist Appt within 5-7 Days Complete  Home Care Screening Complete  Medication Review (RN CM) Complete

## 2023-07-07 NOTE — TOC Progression Note (Signed)
 Transition of Care [facility]) - Progression Note    Patient Details  Name: Chad Hogan MRN: [id number] Date of Birth: 02-Apr-1941  [facility]) CM/SW Contact  Michaela Corner, [place] Phone Number: 07/07/2023, 11:59 AM  Clinical Narrative:   Per Palliative, patient is transitioning to comfort care. [facility] family's wishes. HOP now follow.  TOC will continue to follow.    Expected Discharge Plan: Skilled Nursing Facility Barriers to Discharge: Continued Medical Work up, SNF Pending bed offer, English as a second language teacher  Expected Discharge Plan and Services In-house Referral: Clinical Social Work Discharge Planning Services: CM Consult Post Acute Care Choice: Home Health Living arrangements for the past 2 months: Single Family Home                 DME Arranged: N/A DME Agency: NA       HH Arranged: RN, PT, OT, Nurse's Aide HH Agency: [facility] Date [facility] Agency Contacted: 06/25/23 Time HH Agency Contacted: 1238 Representative spoke with at [facility] Agency: Dewayne Hatch   Social Determinants of Health (SDOH) Interventions SDOH Screenings   Food Insecurity: No Food Insecurity (06/22/2023)  Housing: Low Risk  (06/22/2023)  Transportation Needs: No Transportation Needs (06/22/2023)  Utilities: Not At Risk (06/22/2023)  Depression (PHQ2-9): Low Risk  (04/20/2023)  Social Connections: Socially Isolated (06/22/2023)  Tobacco Use: High Risk (06/22/2023)    Readmission Risk Interventions    06/25/2023   12:29 PM  Readmission Risk Prevention Plan  Transportation Screening Complete  PCP or Specialist Appt within 5-7 Days Complete  Home Care Screening Complete  Medication Review (RN CM) Complete

## 2023-07-07 NOTE — Progress Notes (Signed)
  This pt was referred to hospice services at the [facility]. The daughter was not at bedside when I come by but was able to discuss with her over the phone. She is in agreement with hospice care and would like to proceed with transition to full comfort at the [facility]. I reviewed and discussed with my MD and she did approve the pt for hospice care. We do have a bed to offer the pt and the daughter accepted.   Informed the care team here at [facility] hospital and the MD was in agreement with d/c pt. The daughter is going to come by the [facility] and complete paperwork.   The number for the nurse at [facility] to call report to is [phone]  Norm Parcel RN [phone]

## 2023-07-07 NOTE — Discharge Summary (Signed)
 Physician Discharge Summary  Chad Hogan XBJ:[id number] DOB: Jan 03, 1941 DOA: 06/21/2023  PCP: Bobbye Morton, MD  Admit date: 06/21/2023 Discharge date: 07/07/2023  Time spent: 45 minutes  Recommendations for Outpatient Follow-up:  Residential Hospice for comfort focused care   Discharge Diagnoses:  Principal Problem:   Acute on chronic diastolic CHF (congestive heart failure) (HCC) Active Problems:   Acute kidney injury superimposed on stage 3b chronic kidney disease (HCC)   Liver cirrhosis (HCC)   Essential hypertension   Renal cell carcinoma (HCC)   COPD (chronic obstructive pulmonary disease) (HCC)   Hypothyroidism   Anxiety   CAP (community acquired pneumonia)   Obesity, class 1   DNR (do not resuscitate)   Discharge Condition: poor  Diet recommendation: comfort  Filed Weights   07/04/23 0600 07/06/23 0521 07/07/23 0349  Weight: 101.4 kg 101.6 kg 100.8 kg    History of present illness:  82/M w/ diastolic CHF, Cirrhosis,  CAD, PE, CKD stage IIIb, renal cell cancer who presented to ED w/ complaints of dizziness, fatigue, lightheadedness, difficulty ambulating and dyspnea.  Progressively worsening edema of his lower extremities and abdomen over the last week , treated at his nursing home for recurrent pneumonia with multiple rounds of antibiotics without improvement.In Ed, VSS, abdomen distended with ascites, positive lower extremity edema +++  -bun 34 cr 1,65 AST 54, ALT 30 BNP 291 troponin 11 and 9 hgb 12,9 -CXR wcardiomegaly, with bilateral hilar vascular congestion, bilateral cephalization of the vasculature, small bilateral pleural effusions. -3/17 paracentesis 5.0 L removed.  -3/21 paracentesis with 10 L removed.  -3/22 worsening renal function, recurrent ascites and advanced heart failure, patient with prognosis. Discussed with his daughter at the bedside, she agrees in checking if he will be candidate for residential hospice.  -3/24 transitioned to oral  diuretic therapy, possible transfer to SNF, palliative care consulted -3/25 worsening renal function, consulting nephrology.   Hospital Course:   Acute on chronic diastolic CHF (congestive heart failure) (HCC) Pulm HTN -Echocardiogram with preserved LV systolic function with EF 55%, D shaped septum with RV pressure and volume overload, RV systolic function preserved, moderate RV enlargement,  -diuresed with IV lasix, sp paracentesis x2 - transitioned to torsemide 40 mg.  -Poor prognosis, > sp palliative care meeting, now plan for residential hospice   Acute kidney injury superimposed on stage 3b chronic kidney disease (HCC) Hypokalemia. Hyponatremia.  -With persistent volume overload, now worsening kidney function, concern for hepatorenal syndrome -Nephrology following, prognosis is poor, DC to Residential hospice   Decompensated Liver cirrhosis (HCC) CT 02/2023 with positive cirrhosis.  Worsening ascites, with portal hypertension. 3/17 paracentesis 5 L  3/21 paracentesis 10 L  plus IV albumin 75 gr  Poor prognosis.   Renal cell carcinoma (HCC) Locally advanced right sided renal cell carcinoma.  2024 November CT  -Further workup deferred   COPD (chronic obstructive pulmonary disease) (HCC) No signs of acute exacerbation Continue bronchodilator therapy and supplemental 02 per [place].  He is on home 02 3 L/min    Hypothyroidism -on levothyroxine.    Anxiety -used PRN alprazolam.    CAP (community acquired pneumonia) Patient had 5 days of appropriate antibiotic therapy.    Obesity, class 1 Calculated BMI is 33,6   Code Status: DNR  Discharge Exam: Vitals:   07/07/23 0349 07/07/23 0736  BP: (!) 98/48 (!) 121/55  Pulse: 69 72  Resp: 17 (!) 21  Temp: (!) 97.2 F (36.2 C) (!) 97.3 F (36.3 C)  SpO2: 94% 93%  Discharge Instructions    Allergies as of 07/07/2023       Reactions   Eliquis [apixaban] Other (See Comments)   Bleeding event   Codeine Other (See  Comments)   Cant remember        Medication List     STOP taking these medications    ALPRAZolam 1 MG tablet Commonly known as: XANAX   budesonide 0.5 MG/2ML nebulizer solution Commonly known as: PULMICORT   cholecalciferol 25 MCG (1000 UNIT) tablet Commonly known as: VITAMIN D3   fluticasone-salmeterol 250-50 MCG/ACT Aepb Commonly known as: ADVAIR   ipratropium-albuterol 0.5-2.5 (3) MG/3ML Soln Commonly known as: DUONEB   levETIRAcetam 500 MG tablet Commonly known as: KEPPRA   levothyroxine 25 MCG tablet Commonly known as: SYNTHROID   OVER THE COUNTER MEDICATION   pantoprazole 40 MG tablet Commonly known as: PROTONIX   potassium chloride SA 20 MEQ tablet Commonly known as: KLOR-CON M   rosuvastatin 10 MG tablet Commonly known as: CRESTOR   torsemide 20 MG tablet Commonly known as: DEMADEX   verapamil 240 MG CR tablet Commonly known as: CALAN-SR       Allergies  Allergen Reactions   Eliquis [Apixaban] Other (See Comments)    Bleeding event   Codeine Other (See Comments)    Cant remember    Contact information for follow-up providers     Street, [name], MD. Call.   Specialty: Family Medicine Contact information: [address] [place] [address] [phone]              Contact information for after-discharge care     Destination     [facility] Preferred SNF .   Service: Skilled Nursing Contact information: [address] [place] [address] [phone]                      The results of significant diagnostics from this hospitalization (including imaging, microbiology, ancillary and laboratory) are listed below for reference.    Significant Diagnostic Studies: US RENAL Result Date: 07/07/2023 CLINICAL DATA:  [id number] AKI (acute kidney injury) (HCC) [id number] EXAM: RENAL / URINARY TRACT ULTRASOUND COMPLETE COMPARISON:  Ultrasound abdomen 07/01/2023, CT  abdomen pelvis 03/01/2023 FINDINGS: Right Kidney: Renal measurements: 12.1 x 6.2 x 4.7 cm = volume: 24.4 mL. Echogenicity increased. There is a heterogeneous vascular mass within the right renal parenchyma measuring 4.1 x 2.4 x 3.7 cm. Question perinephric component to the mass that is not well visualized. Question invasion of the renal hilum of the mass which is poorly visualized. 1.9 cm cystic lesion. No hydronephrosis visualized. Left Kidney: Renal measurements: 12.7 x 6.6 x 4.4 cm = volume: 193.1 mL. Echogenicity increased. No mass or hydronephrosis visualized. Urinary bladder: Appears normal for degree of bladder distention. Other: Moderate to large volume simple free fluid ascites. IMPRESSION: 1. Markedly limited evaluation of a heterogeneous vascular mass within the right renal parenchyma measuring 4.1 x 2.4 x 3.7 cm. Question perinephric component to the mass that is not well visualized. Question invasion of the renal hilum of the mass which is poorly visualized. Finding is not well evaluated on this study and previously identified on CT abdomen pelvis 03/01/2023. When the patient is clinically stable and able to follow directions and hold their breath (preferably as an outpatient) further evaluation with dedicated MRI renal protocol should be considered. 2. Increased renal parenchymal echogenicity suggestive of renal parenchymal disease. 3. Moderate to large volume simple fluid ascites. Electronically  Signed   By: Tish Frederickson M.D.   On: 07/07/2023 00:35   VAS [place] UPPER EXTREMITY VENOUS DUPLEX Result Date: 07/06/2023 UPPER VENOUS STUDY  Patient Name:  AIDON KLEMENS  Date of Exam:   07/06/2023 Medical Rec #: [id number]     Accession #:    [id number] Date of Birth: 1940/04/24     Patient Gender: M Patient Age:   41 years Exam Location:  [facility] Procedure:      VAS [place] UPPER EXTREMITY VENOUS DUPLEX Referring Phys: Erin Hearing  --------------------------------------------------------------------------------  Indications: Edema Risk Factors: Cancer Renal. Limitations: Patient movement. Comparison Study: None. Performing Technologist: Shona Simpson  Examination Guidelines: A complete evaluation includes B-mode imaging, spectral Doppler, color Doppler, and power Doppler as needed of all accessible portions of each vessel. Bilateral testing is considered an integral part of a complete examination. Limited examinations for reoccurring indications may be performed as noted.  Right Findings: +----------+------------+---------+-----------+----------+-------+ RIGHT     CompressiblePhasicitySpontaneousPropertiesSummary +----------+------------+---------+-----------+----------+-------+ Subclavian    Full       Yes       Yes                      +----------+------------+---------+-----------+----------+-------+  Left Findings: +----------+------------+---------+-----------+----------+-------+ LEFT      CompressiblePhasicitySpontaneousPropertiesSummary +----------+------------+---------+-----------+----------+-------+ IJV           Full       Yes       Yes                      +----------+------------+---------+-----------+----------+-------+ Subclavian    Full       Yes       Yes                      +----------+------------+---------+-----------+----------+-------+ Axillary      Full       Yes       Yes                      +----------+------------+---------+-----------+----------+-------+ Brachial      Full       Yes       Yes                      +----------+------------+---------+-----------+----------+-------+ Radial        Full       Yes       Yes                      +----------+------------+---------+-----------+----------+-------+ Ulnar         Full       Yes       Yes                      +----------+------------+---------+-----------+----------+-------+ Cephalic      Full                  Yes                      +----------+------------+---------+-----------+----------+-------+ Basilic       Full                 Yes                      +----------+------------+---------+-----------+----------+-------+  Summary:  Right: No evidence of thrombosis in the subclavian.  Left: No evidence of deep  vein thrombosis in the upper extremity. No evidence of superficial vein thrombosis in the upper extremity.  *See table(s) above for measurements and observations.  Diagnosing physician: Carolynn Sayers Electronically signed by Carolynn Sayers on 07/06/2023 at 12:17:53 PM.    Final    IR Paracentesis Result Date: 07/02/2023 INDICATION: 83 year old male with HF and cirrhosis continuing to experience abdominal distention and discomfort, demonstrated remaining peritoneal fluid on [place] despite recent paracentesis on 06/28/23. EXAM: ULTRASOUND GUIDED LEFT THERAPEUTIC PARACENTESIS MEDICATIONS: 10 ml 1% lidocaine COMPLICATIONS: None immediate. PROCEDURE: Informed written consent was obtained from the patient after a discussion of the risks, benefits and alternatives to treatment. A timeout was performed prior to the initiation of the procedure. Initial ultrasound scanning demonstrates a large amount of ascites within the bilateral lower abdominal quadrant with a larger window demonstrated in left lower abdomen. The left lower abdomen was prepped and draped in the usual sterile fashion. 1% lidocaine was used for local anesthesia. Following this, a 19 gauge, 10-cm, Yueh catheter was introduced. An ultrasound image was saved for documentation purposes. The paracentesis was performed. The catheter was removed and a dressing was applied. The patient tolerated the procedure well without immediate post procedural complication. FINDINGS: A total of approximately 10 liters of yellow fluid was removed. IMPRESSION: Successful ultrasound-guided paracentesis yielding 10 liters of peritoneal fluid. Performed by  Carlton Adam, NP Electronically Signed   By: Judie Petit.  Shick M.D.   On: 07/02/2023 18:57   [place] ASCITES (ABDOMEN LIMITED) Result Date: 07/01/2023 CLINICAL DATA:  Distended abdomen after paracentesis EXAM: LIMITED ABDOMEN ULTRASOUND FOR ASCITES TECHNIQUE: Limited ultrasound survey for ascites was performed in all four abdominal quadrants. COMPARISON:  CT abdomen pelvis 07/27/2016 FINDINGS: Sonographic survey in all 4 abdominal quadrants demonstrates moderate to large abdominal ascites. IMPRESSION: Sonographic survey in all 4 abdominal quadrants demonstrates moderate to large abdominal ascites. Electronically Signed   By: Minerva Fester M.D.   On: 07/01/2023 20:46   IR Paracentesis Result Date: 06/28/2023 INDICATION: 83 year old male with history of HFpEF, cirrhosis who presents with abdominal distention, ascites. Request made for diagnostic and therapeutic paracentesis. EXAM: ULTRASOUND GUIDED DIAGNOSTIC AND THERAPEUTIC PARACENTESIS MEDICATIONS: 10 mL 1% lidocaine COMPLICATIONS: None immediate. PROCEDURE: Informed written consent was obtained from the patient after a discussion of the risks, benefits and alternatives to treatment. A timeout was performed prior to the initiation of the procedure. Initial ultrasound scanning demonstrates a large amount of ascites within the left lateral abdomen. The left lateral abdomen was prepped and draped in the usual sterile fashion. 1% lidocaine was used for local anesthesia. Following this, a 19 gauge, 10-cm, Yueh catheter was introduced. An ultrasound image was saved for documentation purposes. The paracentesis was performed. The catheter was removed and a dressing was applied. The patient tolerated the procedure well without immediate post procedural complication. FINDINGS: A total of approximately 5.0 of yellow fluid was removed. Samples were sent to the laboratory as requested by the clinical team. IMPRESSION: Successful ultrasound-guided paracentesis yielding 5.0  liters of peritoneal fluid. Performed by: Loyce Dys PA-C Electronically Signed   By: Corlis Leak M.D.   On: 06/28/2023 17:42   DG CHEST PORT 1 VIEW Result Date: 06/27/2023 CLINICAL DATA:  Short of breath. EXAM: PORTABLE CHEST 1 VIEW COMPARISON:  06/22/2023.  CT, 12/20/2016. FINDINGS: Cardiac silhouette normal in size.  No mediastinal or hilar masses. Bilateral interstitial thickening. Additional opacity at the left lung base obscures hemidiaphragm. Mild hazy airspace opacity noted in the right upper lower  lung and left mid lung. Suspect small pleural effusion on the left. No pneumothorax. Skeletal structures are demineralized, but grossly intact. IMPRESSION: 1. Bilateral interstitial thickening and hazy airspace opacities suspicious for pulmonary edema. 2. Left lower lung opacity consistent with atelectasis and/or pneumonia with a probable small pleural effusion. Electronically Signed   By: Amie Portland M.D.   On: 06/27/2023 13:56   DG Chest Port 1 View Result Date: 06/22/2023 CLINICAL DATA:  Shortness of breath EXAM: PORTABLE CHEST 1 VIEW COMPARISON:  06/21/2023 FINDINGS: The heart size and mediastinal contours are stable. Aortic atherosclerosis. Increasing patchy airspace opacities, most pronounced within the bibasilar regions, right worse than left. No pleural effusion or pneumothorax. IMPRESSION: Increasing patchy airspace opacities, most pronounced within the bibasilar regions. Appearance concerning for multifocal infection. Electronically Signed   By: Duanne Guess D.O.   On: 06/22/2023 13:30   ECHOCARDIOGRAM COMPLETE Result Date: 06/22/2023    ECHOCARDIOGRAM REPORT   Patient Name:   HARIM BI Date of Exam: 06/22/2023 Medical Rec #:  [id number]    Height:       71.0 in Accession #:    [id number]   Weight:       255.0 lb Date of Birth:  [birth year]    BSA:          2.338 m Patient Age:    43 years     BP:           143/66 mmHg Patient Gender: M            HR:           107 bpm. Exam Location:   Inpatient Procedure: 2D Echo, Cardiac Doppler and Color Doppler (Both Spectral and Color            Flow Doppler were utilized during procedure). Indications:    CHF I50.9  History:        Patient has no prior history of Echocardiogram examinations.                 CHF, COPD; Risk Factors:Dyslipidemia and Hypertension.  Sonographer:    Harriette Bouillon RDCS Referring Phys: [id number] ROBERT DORRELL IMPRESSIONS  1. Left ventricular ejection fraction, by estimation, is 55%. The left ventricle has normal function. Left ventricular endocardial border not optimally defined to evaluate regional wall motion. Left ventricular diastolic parameters are consistent with Grade I diastolic dysfunction (impaired relaxation).  2. D-shaped septum suggests a degree of RV pressure/volume overload. Right ventricular systolic function is normal. The right ventricular size is moderately enlarged. Tricuspid regurgitation signal is inadequate for assessing PA pressure.  3. The mitral valve was not well visualized. No evidence of mitral valve regurgitation. No evidence of mitral stenosis.  4. The aortic valve was not well visualized. Aortic valve regurgitation is not visualized. No aortic stenosis is present.  5. IVC not well-visualized.  6. Technically very difficult study. No structure was well-visualized. I get the impression that the LV function is normal but there is a degree of RV dysfunction. FINDINGS  Left Ventricle: Left ventricular ejection fraction, by estimation, is 55%. The left ventricle has normal function. Left ventricular endocardial border not optimally defined to evaluate regional wall motion. The left ventricular internal cavity size was normal in size. There is no left ventricular hypertrophy. Left ventricular diastolic parameters are consistent with Grade I diastolic dysfunction (impaired relaxation). Right Ventricle: D-shaped septum suggests a degree of RV pressure/volume overload. The right ventricular size is moderately  enlarged. Right vetricular wall thickness  was not well visualized. Right ventricular systolic function is normal. Tricuspid regurgitation signal is inadequate for assessing PA pressure. Left Atrium: Left atrial size was normal in size. Right Atrium: Right atrial size was not well visualized. Pericardium: There is no evidence of pericardial effusion. Mitral Valve: The mitral valve was not well visualized. Mild mitral annular calcification. No evidence of mitral valve regurgitation. No evidence of mitral valve stenosis. Tricuspid Valve: The tricuspid valve is not well visualized. Tricuspid valve regurgitation is not demonstrated. Aortic Valve: The aortic valve was not well visualized. Aortic valve regurgitation is not visualized. No aortic stenosis is present. Pulmonic Valve: The pulmonic valve was not well visualized. Pulmonic valve regurgitation is not visualized. Aorta: The aortic root is normal in size and structure. Venous: IVC not well-visualized. The inferior vena cava was not well visualized. IAS/Shunts: The interatrial septum was not well visualized.  LEFT VENTRICLE PLAX 2D LVIDd:         4.60 cm LVIDs:         3.50 cm LV PW:         1.10 cm LV IVS:        1.00 cm LVOT diam:     2.00 cm LV SV:         33 LV SV Index:   14 LVOT Area:     3.14 cm  RIGHT VENTRICLE         IVC TAPSE (M-mode): 1.7 cm  IVC diam: 2.80 cm LEFT ATRIUM         Index LA diam:    4.60 cm 1.97 cm/m  AORTIC VALVE LVOT Vmax:   75.90 cm/s LVOT Vmean:  44.600 cm/s LVOT VTI:    0.104 m  AORTA Ao Root diam: 3.50 cm MITRAL VALVE MV Area (PHT): 2.60 cm    SHUNTS MV Decel Time: 292 msec    Systemic VTI:  0.10 m MV E velocity: 49.30 cm/s  Systemic Diam: 2.00 cm MV A velocity: 82.80 cm/s MV E/A ratio:  0.60 Dalton McleanMD Electronically signed by Wilfred Lacy Signature Date/Time: 06/22/2023/11:16:14 AM    Final    DG Chest 2 View Result Date: 06/21/2023 CLINICAL DATA:  Leg swelling EXAM: CHEST - 2 VIEW COMPARISON:  Chest x-ray 05/12/2023  FINDINGS: There is a small left pleural effusion. There some patchy opacities in the right costophrenic angle and left lung base. The heart is enlarged. There central pulmonary vascular congestion. No pneumothorax or acute fracture. IMPRESSION: 1. Small left pleural effusion. 2. Patchy opacities in the right costophrenic angle and left lung base may represent atelectasis or infection. 3. Cardiomegaly with central pulmonary vascular congestion. Electronically Signed   By: Darliss Cheney M.D.   On: 06/21/2023 18:13    Microbiology: Recent Results (from the past 240 hours)  Gram stain     Status: None   Collection Time: 06/28/23  3:21 PM   Specimen: Abdomen; Peritoneal Fluid  Result Value Ref Range Status   Specimen Description FLUID PERITONEAL ABDOMEN  Final   Special Requests NONE  Final   Gram Stain   Final    WBC PRESENT, PREDOMINANTLY MONONUCLEAR NO ORGANISMS SEEN CYTOSPIN SMEAR Performed at [facility], [address] [place], [place] [address]    Report Status 06/28/2023 FINAL  Final  Culture, body fluid w Gram Stain-bottle     Status: None   Collection Time: 06/28/23  3:21 PM   Specimen: Fluid  Result Value Ref Range Status   Specimen Description FLUID PERITONEAL  ABDOMEN  Final   Special Requests BOTTLES DRAWN AEROBIC AND ANAEROBIC  Final   Culture   Final    NO GROWTH 5 DAYS Performed at [facility], [address], [place], [place] [address]    Report Status 07/03/2023 FINAL  Final     Labs: Basic Metabolic Panel: Recent Labs  Lab 07/02/23 0302 07/03/23 0234 07/04/23 0236 07/05/23 0305 07/06/23 0248 07/07/23 0258  NA 133* 132* 133* 131* 132* 129*  K 3.7 3.6 3.3* 4.2 4.2 4.0  CL 93* 95* 95* 97* 96* 92*  CO2 26 28 27 26 26 24   GLUCOSE 117* 97 103* 101* 114* 100*  BUN 58* 65* 69* 74* 78* 88*  CREATININE 2.94* 3.12* 2.73* 2.98* 3.18* 3.48*  CALCIUM 8.1* 7.7* 7.9* 7.9* 8.1* 8.4*  MG 2.1 2.0 2.1  --   --   --   PHOS  --   --   --   --   --  5.3*    Liver Function Tests: Recent Labs  Lab 07/01/23 0305 07/07/23 0258  AST 42*  --   ALT 27  --   ALKPHOS 163*  --   BILITOT 0.6  --   PROT 5.5*  --   ALBUMIN 2.6* 2.6*   No results for input(s): "LIPASE", "AMYLASE" in the last 168 hours. No results for input(s): "AMMONIA" in the last 168 hours. CBC: Recent Labs  Lab 07/05/23 0305  WBC 9.1  HGB 12.2*  HCT 36.0*  MCV 86.1  PLT 172   Cardiac Enzymes: No results for input(s): "CKTOTAL", "CKMB", "CKMBINDEX", "TROPONINI" in the last 168 hours. BNP: BNP (last 3 results) Recent Labs    06/21/23 1350  BNP 291.1*    ProBNP (last 3 results) No results for input(s): "PROBNP" in the last 8760 hours.  CBG: No results for input(s): "GLUCAP" in the last 168 hours.     Signed:  Zannie Cove MD.  [facility] 07/07/2023, 2:20 PM

## 2023-07-08 NOTE — Consult Note (Signed)
 [facility] Liaison Consult Note   07/08/2023  Chad Hogan [birth year] [id number]  Update:  Change in disposition   - Hospice Care  Reviewed for disposition and patient transitioned to [facility].  [name], RN, BSN, CCM [employer], [facility] Liaison Direct Dial: [phone] or secure chat Email: [email or web address]

## 2023-08-12 DEATH — deceased
